# Patient Record
Sex: Female | Born: 1955 | Race: White | Hispanic: No | Marital: Married | Smoking: Current every day smoker
Health system: Southern US, Community
[De-identification: ages and names within clinical notes are randomized; demographics above are authoritative.]

## PROBLEM LIST (undated history)

## (undated) DIAGNOSIS — R040 Epistaxis: Secondary | ICD-10-CM

## (undated) DIAGNOSIS — D735 Infarction of spleen: Secondary | ICD-10-CM

## (undated) DIAGNOSIS — R918 Other nonspecific abnormal finding of lung field: Secondary | ICD-10-CM

## (undated) DIAGNOSIS — I330 Acute and subacute infective endocarditis: Secondary | ICD-10-CM

## (undated) HISTORY — PX: CHOLECYSTECTOMY: SHX55

## (undated) HISTORY — PX: BACK SURGERY: SHX140

---

## 2008-10-26 ENCOUNTER — Emergency Department (HOSPITAL_COMMUNITY): Admission: EM | Admit: 2008-10-26 | Discharge: 2008-10-26 | Payer: Self-pay | Admitting: Emergency Medicine

## 2010-08-31 LAB — COMPREHENSIVE METABOLIC PANEL
ALT: 17 U/L (ref 0–35)
AST: 20 U/L (ref 0–37)
Albumin: 4.2 g/dL (ref 3.5–5.2)
CO2: 23 mEq/L (ref 19–32)
Chloride: 108 mEq/L (ref 96–112)
GFR calc Af Amer: >60 mL/min (ref >60)
GFR calc non Af Amer: >60 mL/min (ref >60)
Sodium: 138 mEq/L (ref 135–145)
Total Bilirubin: 1.1 mg/dL (ref 0.3–1.2)

## 2010-08-31 LAB — CBC
Platelets: 261 10*3/uL (ref 150–400)
RBC: 4.25 MIL/uL (ref 3.87–5.11)
WBC: 11.5 10*3/uL — ABNORMAL HIGH (ref 4.0–10.5)

## 2010-08-31 LAB — DIFFERENTIAL
Basophils Absolute: 0.1 10*3/uL (ref 0.0–0.1)
Eosinophils Absolute: 0.1 10*3/uL (ref 0.0–0.7)
Eosinophils Relative: 1 % (ref 0–5)
Lymphocytes Relative: 23 % (ref 12–46)
Lymphs Abs: 2.6 10*3/uL (ref 0.7–4.0)
Monocytes Absolute: 0.6 10*3/uL (ref 0.1–1.0)

## 2010-08-31 LAB — PROTIME-INR: Prothrombin Time: 13.6 seconds (ref 11.6–15.2)

## 2010-10-06 NOTE — Op Note (Signed)
NAMEJUANICE, WARBURTON               ACCOUNT NO.:  [id number]   MEDICAL RECORD NO.:  [id number]          PATIENT TYPE:  EMS   LOCATION:  MAJO                         FACILITY:  [facility]   PHYSICIAN:  Kristine Garbe. Ezzard Standing, M.D.DATE OF BIRTH:  11/07/1955   DATE OF PROCEDURE:  10/26/2008  DATE OF DISCHARGE:                               OPERATIVE REPORT   PREOPERATIVE DIAGNOSIS:  Left-sided epistaxis, posterior.   POSTOPERATIVE DIAGNOSIS:  Left-sided epistaxis, posterior.   OPERATION:  Endoscopic cauterization of left-sided epistaxis.   SURGEON:  Kristine Garbe. Ezzard Standing, MD   ANESTHESIA:  General endotracheal.   COMPLICATIONS:  None.   BRIEF CLINICAL NOTE:  Cadience Bradfield is a 55 year old female who has had  chronic nosebleed now for about 3-4 days.  She was seen at urgent care,  had packing placed, but continued to have bleeding posteriorly as well  as coming out of the right side, although it began on the left side.  She was subsequently transferred to [facility] ER.  Upon our evaluation in the  ER, she had some deviation of septum to the left as well as bleeding  from what appears to be a posterior type of nosebleed.  Packing was  unsuccessful in stopping the bleeding and she was taken to operating  room at this time for endoscopic cauterization of the left-sided  epistaxis.   DESCRIPTION OF PROCEDURE:  The patient was brought to the operating  room.  She underwent general endotracheal anesthesia.  The previous  packing was removed and the bleeding site appeared to be anterior middle  turbinate.  She also had some septal spurring to the left.  The anterior  middle turbinate was cauterized using suction cautery and no further  bleeding was observed.  The nose was cleaned of all the blood clot, no  active bleeding noted after cauterizing the anterior middle turbinate.  Some pieces of Surgicel were placed superiorly in the nose around the  middle turbinate and then the floor of the nose was  packed with single  Merocel pack.  Nasogastric tube was passed and stomach was suctioned,  oropharynx was examined, and there was no further bleeding noted.  The  patient was awoken from anesthesia and transferred to recovery room  postop doing well.   DISPOSITION:  Karren is discharged home later this evening on  amoxicillin suspension 500 mg b.i.d. for 5 days.  We will have her  follow up in my office in 4 days for recheck to have the packing  removed.   Thayer Ohm  thank you          ______________________________  Kristine Garbe. Ezzard Standing, M.D.    CEN/MEDQ  D:  10/26/2008  T:  10/27/2008  Job:  [id number]

## 2010-10-06 NOTE — Consult Note (Signed)
NAMECAELI, LINEHAN               ACCOUNT NO.:  [id number]   MEDICAL RECORD NO.:  [id number]          PATIENT TYPE:  EMS   LOCATION:  MAJO                         FACILITY:  [facility]   PHYSICIAN:  Kristine Garbe. Ezzard Standing, M.D.DATE OF BIRTH:  1955-11-17   DATE OF CONSULTATION:  10/26/2008  DATE OF DISCHARGE:                                 CONSULTATION   REASON FOR CONSULTATION:  Epistaxis.   BRIEF HISTORY:  Becky Patterson is a 55 year old female who presents to  [facility] ER referred by urgent care because of persistent epistaxis despite  the packing.  This began a couple of days ago with some intermittent  bleeding from the left nostril.  The bleeding became more profuse  earlier today.  She was seen at urgent care where they attempted to  cauterize and pack the nose, but she continued to have bleeding from  both sides and was referred to [facility] Emergency Room.  I saw the patient  in [facility] Emergency Room and on evaluation in [facility] Emergency Room, she has  what appears to be a posterior left-sided epistaxis.  Packing, Rhino  Rocket was placed, but she continued to have bleeding.  She subsequently  admitted for endoscopic cauterization of epistaxis on the left side.  She has no previous health history.  No coronary artery disease.  No  blood thinners.  No real medications.   ALLERGIES:  CODEINE, which causes nausea and vomiting.   PHYSICAL EXAMINATION:  HEENT:  The patient has a bleeding from the left  posterior nasal passageway, could not identify the definite site.  CARDIAC:  Regular rate and rhythm without murmur.  LUNGS:  Clear.  ABDOMEN:  Soft, nontender.  EXTREMITIES:  Normal.   IMPRESSION:  Posterior left epistaxis.   PLAN:  The patient will be admitted and taken to the operating room for  endoscopic cauterization of left-sided epistaxis.           ______________________________  Kristine Garbe Ezzard Standing, M.D.     CEN/MEDQ  D:  10/26/2008  T:  10/27/2008  Job:  [id number]

## 2015-06-15 ENCOUNTER — Emergency Department (HOSPITAL_COMMUNITY): Payer: 59

## 2015-06-15 ENCOUNTER — Inpatient Hospital Stay (HOSPITAL_COMMUNITY)
Admission: EM | Admit: 2015-06-15 | Discharge: 2015-06-26 | DRG: 871 | Disposition: A | Discharge status: Home / Self Care | Payer: 59 | Attending: Family Medicine | Admitting: Family Medicine

## 2015-06-15 ENCOUNTER — Encounter (HOSPITAL_COMMUNITY): Payer: Self-pay | Admitting: Emergency Medicine

## 2015-06-15 DIAGNOSIS — J984 Other disorders of lung: Secondary | ICD-10-CM | POA: Diagnosis not present

## 2015-06-15 DIAGNOSIS — A4181 Sepsis due to Enterococcus: Principal | ICD-10-CM | POA: Diagnosis present

## 2015-06-15 DIAGNOSIS — D638 Anemia in other chronic diseases classified elsewhere: Secondary | ICD-10-CM | POA: Diagnosis present

## 2015-06-15 DIAGNOSIS — F17200 Nicotine dependence, unspecified, uncomplicated: Secondary | ICD-10-CM | POA: Diagnosis present

## 2015-06-15 DIAGNOSIS — E876 Hypokalemia: Secondary | ICD-10-CM | POA: Diagnosis present

## 2015-06-15 DIAGNOSIS — N12 Tubulo-interstitial nephritis, not specified as acute or chronic: Secondary | ICD-10-CM | POA: Diagnosis present

## 2015-06-15 DIAGNOSIS — R911 Solitary pulmonary nodule: Secondary | ICD-10-CM | POA: Diagnosis not present

## 2015-06-15 DIAGNOSIS — K529 Noninfective gastroenteritis and colitis, unspecified: Secondary | ICD-10-CM | POA: Diagnosis present

## 2015-06-15 DIAGNOSIS — R0902 Hypoxemia: Secondary | ICD-10-CM | POA: Diagnosis present

## 2015-06-15 DIAGNOSIS — I059 Rheumatic mitral valve disease, unspecified: Secondary | ICD-10-CM | POA: Diagnosis present

## 2015-06-15 DIAGNOSIS — D649 Anemia, unspecified: Secondary | ICD-10-CM

## 2015-06-15 DIAGNOSIS — D735 Infarction of spleen: Secondary | ICD-10-CM | POA: Diagnosis present

## 2015-06-15 DIAGNOSIS — R509 Fever, unspecified: Secondary | ICD-10-CM | POA: Diagnosis not present

## 2015-06-15 DIAGNOSIS — I349 Nonrheumatic mitral valve disorder, unspecified: Secondary | ICD-10-CM | POA: Diagnosis not present

## 2015-06-15 DIAGNOSIS — E861 Hypovolemia: Secondary | ICD-10-CM | POA: Diagnosis present

## 2015-06-15 DIAGNOSIS — J189 Pneumonia, unspecified organism: Secondary | ICD-10-CM | POA: Diagnosis present

## 2015-06-15 DIAGNOSIS — I959 Hypotension, unspecified: Secondary | ICD-10-CM | POA: Diagnosis present

## 2015-06-15 DIAGNOSIS — R739 Hyperglycemia, unspecified: Secondary | ICD-10-CM | POA: Diagnosis present

## 2015-06-15 DIAGNOSIS — R918 Other nonspecific abnormal finding of lung field: Secondary | ICD-10-CM | POA: Diagnosis present

## 2015-06-15 DIAGNOSIS — Z720 Tobacco use: Secondary | ICD-10-CM | POA: Diagnosis present

## 2015-06-15 DIAGNOSIS — B9689 Other specified bacterial agents as the cause of diseases classified elsewhere: Secondary | ICD-10-CM | POA: Diagnosis present

## 2015-06-15 DIAGNOSIS — C3490 Malignant neoplasm of unspecified part of unspecified bronchus or lung: Secondary | ICD-10-CM

## 2015-06-15 DIAGNOSIS — N10 Acute tubulo-interstitial nephritis: Secondary | ICD-10-CM | POA: Diagnosis not present

## 2015-06-15 DIAGNOSIS — M549 Dorsalgia, unspecified: Secondary | ICD-10-CM | POA: Diagnosis present

## 2015-06-15 DIAGNOSIS — I330 Acute and subacute infective endocarditis: Secondary | ICD-10-CM | POA: Diagnosis present

## 2015-06-15 DIAGNOSIS — R7881 Bacteremia: Secondary | ICD-10-CM | POA: Diagnosis not present

## 2015-06-15 DIAGNOSIS — A419 Sepsis, unspecified organism: Secondary | ICD-10-CM | POA: Diagnosis present

## 2015-06-15 DIAGNOSIS — B952 Enterococcus as the cause of diseases classified elsewhere: Secondary | ICD-10-CM

## 2015-06-15 DIAGNOSIS — Z981 Arthrodesis status: Secondary | ICD-10-CM | POA: Diagnosis not present

## 2015-06-15 DIAGNOSIS — D509 Iron deficiency anemia, unspecified: Secondary | ICD-10-CM | POA: Diagnosis present

## 2015-06-15 DIAGNOSIS — D72829 Elevated white blood cell count, unspecified: Secondary | ICD-10-CM

## 2015-06-15 DIAGNOSIS — E871 Hypo-osmolality and hyponatremia: Secondary | ICD-10-CM | POA: Diagnosis present

## 2015-06-15 DIAGNOSIS — I76 Septic arterial embolism: Secondary | ICD-10-CM | POA: Diagnosis present

## 2015-06-15 DIAGNOSIS — R160 Hepatomegaly, not elsewhere classified: Secondary | ICD-10-CM | POA: Diagnosis present

## 2015-06-15 DIAGNOSIS — N390 Urinary tract infection, site not specified: Secondary | ICD-10-CM

## 2015-06-15 DIAGNOSIS — D689 Coagulation defect, unspecified: Secondary | ICD-10-CM | POA: Diagnosis not present

## 2015-06-15 DIAGNOSIS — E119 Type 2 diabetes mellitus without complications: Secondary | ICD-10-CM | POA: Diagnosis not present

## 2015-06-15 LAB — URINALYSIS, ROUTINE W REFLEX MICROSCOPIC
Bilirubin Urine: NEGATIVE
Glucose, UA: NEGATIVE mg/dL
KETONES UR: NEGATIVE mg/dL
NITRITE: NEGATIVE
PH: 5.5 (ref 5.0–8.0)
Protein, ur: 30 mg/dL — AB
SPECIFIC GRAVITY, URINE: 1.014 (ref 1.005–1.030)

## 2015-06-15 LAB — COMPREHENSIVE METABOLIC PANEL
ALT: 30 U/L (ref 14–54)
AST: 30 U/L (ref 15–41)
Albumin: 2.4 g/dL — ABNORMAL LOW (ref 3.5–5.0)
Alkaline Phosphatase: 115 U/L (ref 38–126)
Anion gap: 12 (ref 5–15)
BUN: 12 mg/dL (ref 6–20)
CHLORIDE: 93 mmol/L — AB (ref 101–111)
CO2: 22 mmol/L (ref 22–32)
Calcium: 8.7 mg/dL — ABNORMAL LOW (ref 8.9–10.3)
Creatinine, Ser: 1.16 mg/dL — ABNORMAL HIGH (ref 0.44–1.00)
GFR, EST AFRICAN AMERICAN: 58 mL/min — AB (ref >60)
GFR, EST NON AFRICAN AMERICAN: 50 mL/min — AB (ref >60)
Glucose, Bld: 199 mg/dL — ABNORMAL HIGH (ref 65–99)
POTASSIUM: 3.7 mmol/L (ref 3.5–5.1)
SODIUM: 127 mmol/L — AB (ref 135–145)
Total Bilirubin: 0.6 mg/dL (ref 0.3–1.2)
Total Protein: 6.9 g/dL (ref 6.5–8.1)

## 2015-06-15 LAB — CBC WITH DIFFERENTIAL/PLATELET
BASOS ABS: 0 10*3/uL (ref 0.0–0.1)
BASOS PCT: 0 %
Eosinophils Absolute: 0 10*3/uL (ref 0.0–0.7)
Eosinophils Relative: 0 %
HEMATOCRIT: 32.6 % — AB (ref 36.0–46.0)
HEMOGLOBIN: 11.2 g/dL — AB (ref 12.0–15.0)
LYMPHS PCT: 5 %
Lymphs Abs: 1.1 10*3/uL (ref 0.7–4.0)
MCH: 31.7 pg (ref 26.0–34.0)
MCHC: 34.4 g/dL (ref 30.0–36.0)
MCV: 92.4 fL (ref 78.0–100.0)
Monocytes Absolute: 0.8 10*3/uL (ref 0.1–1.0)
Monocytes Relative: 4 %
NEUTROS ABS: 20.8 10*3/uL — AB (ref 1.7–7.7)
NEUTROS PCT: 91 %
Platelets: 230 10*3/uL (ref 150–400)
RBC: 3.53 MIL/uL — AB (ref 3.87–5.11)
RDW: 13.3 % (ref 11.5–15.5)
WBC: 22.8 10*3/uL — AB (ref 4.0–10.5)

## 2015-06-15 LAB — STREP PNEUMONIAE URINARY ANTIGEN: STREP PNEUMO URINARY ANTIGEN: NEGATIVE

## 2015-06-15 LAB — URINE MICROSCOPIC-ADD ON

## 2015-06-15 LAB — I-STAT CG4 LACTIC ACID, ED
LACTIC ACID, VENOUS: 3.02 mmol/L — AB (ref 0.5–2.0)
Lactic Acid, Venous: 0.74 mmol/L (ref 0.5–2.0)

## 2015-06-15 LAB — CREATININE, SERUM
Creatinine, Ser: 1.04 mg/dL — ABNORMAL HIGH (ref 0.44–1.00)
GFR calc Af Amer: >60 mL/min (ref >60)
GFR calc non Af Amer: 57 mL/min — ABNORMAL LOW (ref >60)

## 2015-06-15 LAB — RETICULOCYTES
RBC.: 2.7 MIL/uL — AB (ref 3.87–5.11)
RETIC COUNT ABSOLUTE: 45.9 10*3/uL (ref 19.0–186.0)
Retic Ct Pct: 1.7 % (ref 0.4–3.1)

## 2015-06-15 LAB — CBC
HCT: 24.9 % — ABNORMAL LOW (ref 36.0–46.0)
HEMOGLOBIN: 8.6 g/dL — AB (ref 12.0–15.0)
MCH: 31.9 pg (ref 26.0–34.0)
MCHC: 34.5 g/dL (ref 30.0–36.0)
MCV: 92.2 fL (ref 78.0–100.0)
Platelets: 201 10*3/uL (ref 150–400)
RBC: 2.7 MIL/uL — ABNORMAL LOW (ref 3.87–5.11)
RDW: 13.3 % (ref 11.5–15.5)
WBC: 20 10*3/uL — ABNORMAL HIGH (ref 4.0–10.5)

## 2015-06-15 LAB — MRSA PCR SCREENING: MRSA BY PCR: NEGATIVE

## 2015-06-15 MED ORDER — VANCOMYCIN HCL IN DEXTROSE 1-5 GM/200ML-% IV SOLN: 1000.0000 mg | Freq: Once | INTRAVENOUS | Status: DC

## 2015-06-15 MED ORDER — PIPERACILLIN-TAZOBACTAM 3.375 G IVPB
3.3750 g | Freq: Three times a day (TID) | INTRAVENOUS | Status: DC
  Filled 2015-06-15: qty 50

## 2015-06-15 MED ORDER — SODIUM CHLORIDE 0.9 % IV SOLN
INTRAVENOUS | Status: DC
  Administered 2015-06-15 – 2015-06-18 (×7): via INTRAVENOUS

## 2015-06-15 MED ORDER — HYDROCODONE-ACETAMINOPHEN 5-325 MG PO TABS: 1.0000 | ORAL_TABLET | ORAL | Status: DC | PRN

## 2015-06-15 MED ORDER — DEXTROSE 5 % IV SOLN
500.0000 mg | INTRAVENOUS | Status: DC
  Administered 2015-06-16: 500 mg via INTRAVENOUS
  Filled 2015-06-15 (×2): qty 500

## 2015-06-15 MED ORDER — SODIUM CHLORIDE 0.9 % IJ SOLN
3.0000 mL | Freq: Two times a day (BID) | INTRAMUSCULAR | Status: DC
  Administered 2015-06-15 – 2015-06-26 (×16): 3 mL via INTRAVENOUS

## 2015-06-15 MED ORDER — SODIUM CHLORIDE 0.9 % IV BOLUS (SEPSIS)
1000.0000 mL | INTRAVENOUS | Status: AC
  Administered 2015-06-15 (×3): 1000 mL via INTRAVENOUS

## 2015-06-15 MED ORDER — DEXTROSE 5 % IV SOLN
1.0000 g | INTRAVENOUS | Status: DC
  Administered 2015-06-16: 1 g via INTRAVENOUS
  Filled 2015-06-15: qty 10

## 2015-06-15 MED ORDER — SODIUM CHLORIDE 0.9 % IV BOLUS (SEPSIS)
1000.0000 mL | Freq: Once | INTRAVENOUS | Status: AC
  Administered 2015-06-15: 1000 mL via INTRAVENOUS

## 2015-06-15 MED ORDER — ACETAMINOPHEN 650 MG RE SUPP: 650.0000 mg | Freq: Four times a day (QID) | RECTAL | Status: DC | PRN

## 2015-06-15 MED ORDER — DEXTROSE 5 % IV SOLN: 1.0000 g | Freq: Once | INTRAVENOUS | Status: DC

## 2015-06-15 MED ORDER — PIPERACILLIN-TAZOBACTAM 3.375 G IVPB 30 MIN
3.3750 g | Freq: Once | INTRAVENOUS | Status: AC
  Administered 2015-06-15: 3.375 g via INTRAVENOUS
  Filled 2015-06-15: qty 50

## 2015-06-15 MED ORDER — ONDANSETRON HCL 4 MG/2ML IJ SOLN
4.0000 mg | Freq: Four times a day (QID) | INTRAMUSCULAR | Status: DC | PRN
  Administered 2015-06-17 – 2015-06-18 (×3): 4 mg via INTRAVENOUS
  Filled 2015-06-15 (×3): qty 2

## 2015-06-15 MED ORDER — DEXTROSE 5 % IV SOLN: 500.0000 mg | Freq: Once | INTRAVENOUS | Status: DC

## 2015-06-15 MED ORDER — ACETAMINOPHEN 325 MG PO TABS
650.0000 mg | ORAL_TABLET | Freq: Four times a day (QID) | ORAL | Status: DC | PRN
  Administered 2015-06-16: 650 mg via ORAL
  Filled 2015-06-15: qty 2

## 2015-06-15 MED ORDER — ACETAMINOPHEN 325 MG PO TABS
650.0000 mg | ORAL_TABLET | Freq: Once | ORAL | Status: AC
  Administered 2015-06-15: 650 mg via ORAL
  Filled 2015-06-15: qty 2

## 2015-06-15 MED ORDER — VANCOMYCIN HCL 500 MG IV SOLR
500.0000 mg | Freq: Once | INTRAVENOUS | Status: AC
  Administered 2015-06-15: 500 mg via INTRAVENOUS
  Filled 2015-06-15: qty 500

## 2015-06-15 MED ORDER — VANCOMYCIN HCL IN DEXTROSE 750-5 MG/150ML-% IV SOLN
750.0000 mg | Freq: Two times a day (BID) | INTRAVENOUS | Status: DC
  Filled 2015-06-15: qty 150

## 2015-06-15 MED ORDER — ONDANSETRON HCL 4 MG PO TABS
4.0000 mg | ORAL_TABLET | Freq: Four times a day (QID) | ORAL | Status: DC | PRN
  Administered 2015-06-21: 4 mg via ORAL
  Filled 2015-06-15: qty 1

## 2015-06-15 MED ORDER — VANCOMYCIN HCL IN DEXTROSE 1-5 GM/200ML-% IV SOLN
1000.0000 mg | Freq: Once | INTRAVENOUS | Status: AC
  Administered 2015-06-15: 1000 mg via INTRAVENOUS
  Filled 2015-06-15: qty 200

## 2015-06-15 MED ORDER — ENOXAPARIN SODIUM 40 MG/0.4ML ~~LOC~~ SOLN
40.0000 mg | SUBCUTANEOUS | Status: DC
  Administered 2015-06-16: 40 mg via SUBCUTANEOUS
  Filled 2015-06-15: qty 0.4

## 2015-06-15 NOTE — ED Provider Notes (Signed)
CSN: [id number]     Arrival date & time 06/15/15  1356 History   First MD Initiated Contact with Patient 06/15/15 1547     Chief Complaint  Patient presents with  . Back Pain   HPI   60 year old female presents today with concerns of back pain and weakness. Patient reports that approximately 3-4 weeks ago she had reported flu with upper respiratory complaints, nausea, vomiting, diarrhea. She reports that several weeks and this is with some improvement when she injured her back. She reports significant past medical history of lumbar fusion and reported right hip operation. She reports that 8 days ago she went to step up onto a chair with her right leg and felt a sharp pain to her right lower back and hip. She reports this pain was severe, persistent, made worse with ambulation movements. She notes some bilateral intermittent tingling of the feet but denies any loss of distal sensation strength or motor function. Patient denies any bowel or bladder incontinence or tenderness over the spine. She was seen at urgent care given an injection at the site of the pain, and pain medication.  She reports that her urine has been concentrated and dark with an odor, but denies any burning. She denies any belly pain since getting over the flu, but does report shakes this morning. She reports decreased appetite, but is tolerating by mouth. She denies any upper respiratory complaints including rhinorrhea, congestion, she does note a dry nonproductive cough that is her baseline due to smoking. She denies any recent antibiotic exposure, does report runny bowel movements.   History reviewed. No pertinent past medical history. Past Surgical History  Procedure Laterality Date  . Back surgery     No family history on file. Social History  Substance Use Topics  . Smoking status: Current Every Day Smoker  . Smokeless tobacco: None  . Alcohol Use: No   OB History    No data available     Review of Systems  All  other systems reviewed and are negative.   Allergies  Codeine  Home Medications   Prior to Admission medications   Medication Sig Start Date End Date Taking? Authorizing Provider  acetaminophen (TYLENOL) 500 MG tablet Take 1,000 mg by mouth 2 (two) times daily as needed for mild pain or headache.   Yes Historical Provider, MD  Hydrocodone-Acetaminophen 5-300 MG TABS Take 1 tablet by mouth every 6 (six) hours as needed (for pain). Reported on 06/15/2015 06/08/15  Yes Historical Provider, MD  naproxen sodium (ANAPROX) 220 MG tablet Take 440 mg by mouth 2 (two) times daily as needed (for pain).   Yes Historical Provider, MD  predniSONE (DELTASONE) 20 MG tablet Take 20 mg by mouth daily. Reported on 06/15/2015 06/08/15  Yes Historical Provider, MD   BP 85/53 mmHg  Pulse 87  Temp(Src) 100.5 F (38.1 C) (Oral)  Resp 23  Ht '5\' 2"'$  (1.575 m)  Wt 90.266 kg  BMI 36.39 kg/m2  SpO2 97%   Physical Exam  Constitutional: She is oriented to person, place, and time. She appears well-developed and well-nourished. No distress.  HENT:  Head: Normocephalic and atraumatic.  Eyes: Conjunctivae are normal. Pupils are equal, round, and reactive to light. Right eye exhibits no discharge. Left eye exhibits no discharge. No scleral icterus.  Neck: Normal range of motion. Neck supple. No JVD present. No tracheal deviation present.  Cardiovascular: Regular rhythm, normal heart sounds and intact distal pulses.  Exam reveals no gallop and no friction  rub.   No murmur heard. Pulmonary/Chest: Effort normal and breath sounds normal. No stridor. No respiratory distress. She has no wheezes. She has no rales. She exhibits no tenderness.  Abdominal: She exhibits no distension and no mass. There is no tenderness. There is no rebound and no guarding.  Musculoskeletal: Normal range of motion. She exhibits tenderness. She exhibits no edema.  No C, T, or L spine tenderness to palpation. No obvious signs of trauma, infection,  step-offs. Lung expansion normal. No scoliosis or kyphosis. Bilateral lower extremity strength 5 out of 5, sensation grossly intact, patellar reflexes 2+, pedal pulses 2+, Refill less than 3 seconds.  Pt does have area of soft tissue abnormality to lumbar spine, not warm to touch, non tender, no redness, Pt reports this is chronic since surgery   TTP of right posterior hip, pain worse with flexion extension.   Straight leg negative    Neurological: She is alert and oriented to person, place, and time. Coordination normal.  Skin: Skin is warm and dry. She is not diaphoretic.  Psychiatric: She has a normal mood and affect. Her behavior is normal. Judgment and thought content normal.  Nursing note and vitals reviewed.    ED Course  Procedures (including critical care time) Labs Review Labs Reviewed  COMPREHENSIVE METABOLIC PANEL - Abnormal; Notable for the following:    Sodium 127 (*)    Chloride 93 (*)    Glucose, Bld 199 (*)    Creatinine, Ser 1.16 (*)    Calcium 8.7 (*)    Albumin 2.4 (*)    GFR calc non Af Amer 50 (*)    GFR calc Af Amer 58 (*)    All other components within normal limits  CBC WITH DIFFERENTIAL/PLATELET - Abnormal; Notable for the following:    WBC 22.8 (*)    RBC 3.53 (*)    Hemoglobin 11.2 (*)    HCT 32.6 (*)    Neutro Abs 20.8 (*)    All other components within normal limits  URINALYSIS, ROUTINE W REFLEX MICROSCOPIC (NOT AT [facility]) - Abnormal; Notable for the following:    Color, Urine AMBER (*)    APPearance CLOUDY (*)    Hgb urine dipstick MODERATE (*)    Protein, ur 30 (*)    Leukocytes, UA MODERATE (*)    All other components within normal limits  URINE MICROSCOPIC-ADD ON - Abnormal; Notable for the following:    Squamous Epithelial / LPF 6-30 (*)    Bacteria, UA MANY (*)    All other components within normal limits  I-STAT CG4 LACTIC ACID, ED - Abnormal; Notable for the following:    Lactic Acid, Venous 3.02 (*)    All other components within  normal limits  CULTURE, BLOOD (ROUTINE X 2)  CULTURE, BLOOD (ROUTINE X 2)  URINE CULTURE  I-STAT CG4 LACTIC ACID, ED  I-STAT CG4 LACTIC ACID, ED  I-STAT CG4 LACTIC ACID, ED  I-STAT CG4 LACTIC ACID, ED    Imaging Review Dg Chest 2 View  06/15/2015  CLINICAL DATA:  Productive cough for 3 weeks, initial encounter EXAM: CHEST - 2 VIEW COMPARISON:  None. FINDINGS: Cardiac shadow is within normal limits. The lungs are well aerated bilaterally. There is a well-circumscribed 2 cm nodule identified in the mid left lung which appears to project over the spine in the left lower lobe on the lateral projection. Additionally a second somewhat nodular density is noted in the medial costophrenic angle on the left. These changes are  suspicious for metastatic disease in CT of the chest is recommended. IMPRESSION: Nodular changes on the left. This is suspicious for metastatic disease. CT of the chest is recommended Electronically Signed   By: Inez Catalina M.D.   On: 06/15/2015 16:01   Dg Lumbar Spine Complete  06/15/2015  CLINICAL DATA:  Lumbar spine pain for 2 weeks, right hip pain, history of lumbar fusion 30 years ago EXAM: LUMBAR SPINE - COMPLETE 4+ VIEW COMPARISON:  None. FINDINGS: L3 through the sacrum bilateral osseous fusion masses. Focal irregular sclerosis over the right sacroiliac joint primarily on the iliac side. This process measures 5 cm. Left sacroiliac joint normal. Grade 1 borderline grade 2 anterior listhesis of L4 on L5. Severe L4-5 degenerative disc disease. Mild degenerative disc disease at L5-S1 and L3-L4. Moderate degenerative disc disease at L2-3. Minimal degenerative disc disease at L1-2. No evidence of vertebral body fracture. IMPRESSION: Postoperative and degenerative changes as described above. Irregular sclerosis right iliac wing. In metastatic lesions not excluded. There are no prior studies for comparison. If no prior studies can't be obtained to document long-term stability in this  finding, the possibility of malignancy would have to be considered and bone scan would be suggested. Electronically Signed   By: Skipper Cliche M.D.   On: 06/15/2015 17:25   Ct Chest Wo Contrast  06/15/2015  CLINICAL DATA:  Productive cough for 3 weeks. Decreased appetite. Left lung nodule on chest x-ray 06/15/2015. EXAM: CT CHEST WITHOUT CONTRAST TECHNIQUE: Multidetector CT imaging of the chest was performed following the standard protocol without IV contrast. COMPARISON:  Chest radiograph 06/15/2015 FINDINGS: There is a circumscribed nodule in the superior segment left lower lobe measuring 1.8 cm diameter. Appearance is suspicious for malignancy, possibly metastasis due to the shape. No significant spiculation. No other nodules identified. Consider PET-CT versus biopsy for further evaluation. Atelectasis in both lung bases. No focal consolidation. No pneumothorax. No pleural effusions. Esophagus is decompressed. No significant lymphadenopathy in the mediastinum or hilar regions on noncontrast imaging. Normal caliber thoracic aorta. Normal heart size. Visualized portions of the upper abdominal organs demonstrate surgical absence of the gallbladder. Possibility of prominent lymph nodes in the left periaortic region although incompletely evaluated. Degenerative changes in the thoracic spine. IMPRESSION: 1.8 cm diameter circumscribed nodule in the superior segment left lobe of liver is indeterminate. Malignancy should be excluded. Consider further evaluation with PET-CT or biopsy. Electronically Signed   By: Lucienne Capers M.D.   On: 06/15/2015 19:04   Dg Hip Unilat With Pelvis 2-3 Views Right  06/15/2015  CLINICAL DATA:  60 year old female with right hip pain after injury while walking 2 weeks ago. Initial encounter. EXAM: DG HIP (WITH OR WITHOUT PELVIS) 2-3V RIGHT COMPARISON:  None. FINDINGS: Femoral heads are normally located. Hip joint spaces are preserved. Pelvis appears intact. There is sclerosis along  the medial right iliac bone which probably is postoperative in nature; there are lower lumbar postoperative fusion masses demonstrated bilaterally. The SI joints otherwise appear normal. Proximal left femur appears intact. Proximal right femur intact. IMPRESSION: No acute osseous abnormality identified about the right hip or pelvis. Postoperative changes to the medial right iliac bone and visualized lower lumbar levels. Electronically Signed   By: Genevie Ann M.D.   On: 06/15/2015 17:29   I have personally reviewed and evaluated these images and lab results as part of my medical decision-making.   EKG Interpretation None      MDM   Final diagnoses:  Sepsis, due to unspecified  organism [facility])    Labs: Sepsis labs- moderate leukocytes with 6-30 WBCs, sodium 127, creatinine 1.16  Imaging: DG chest 2 view, DG hip unilateral right, ED lumbar spine  Consults: Hospitalist- Danford MD  Therapeutics: Thank, Zosyn, normal saline  Discharge Meds:   Assessment/Plan: Patient presents as consistent with sepsis. She had an elevated lactic acid at 3.02 she was a brawl, tachycardic, hypotensive. She was started on sepsis protocol immediately. Patient has upper respiratory symptoms that would indicate pneumonia, she also has concerning findings on her chest x-ray and CT scan for malignancy. Urinalysis show moderate leukocytes which could also be source of infectious etiology, although probably secondary to upper respiratory complaints. Patient started on broad-spectrum antibiotics, hospitalist consult for hospital admission.              Okey Regal, PA-C 06/15/15 2034  Carmin Muskrat, MD 06/21/15 [phone]

## 2015-06-15 NOTE — Progress Notes (Addendum)
ANTIBIOTIC CONSULT NOTE - INITIAL  Pharmacy Consult for Azithro/CTX Indication: CAP  Allergies  Allergen Reactions  . Codeine Nausea And Vomiting    Patient Measurements: Height: '5\' 2"'$  (157.5 cm) Weight: 202 lb 9.6 oz (91.9 kg) IBW/kg (Calculated) : 50.1  Vital Signs: Temp: 97.8 F (36.6 C) (01/22 2131) Temp Source: Oral (01/22 2131) BP: 103/81 mmHg (01/22 2131) Pulse Rate: 88 (01/22 2131) Intake/Output from previous day:   Intake/Output from this shift: Total I/O In: 1000 [I.V.:1000] Out: -   Labs:  Recent Labs  06/15/15 1520  WBC 22.8*  HGB 11.2*  PLT 230  CREATININE 1.16*   Estimated Creatinine Clearance: 53.7 mL/min (by C-G formula based on Cr of 1.16). No results for input(s): VANCOTROUGH, VANCOPEAK, VANCORANDOM, GENTTROUGH, GENTPEAK, GENTRANDOM, TOBRATROUGH, TOBRAPEAK, TOBRARND, AMIKACINPEAK, AMIKACINTROU, AMIKACIN in the last 72 hours.   Microbiology: No results found for this or any previous visit (from the past 720 hour(s)).  Medical History: History reviewed. No pertinent past medical history.  Assessment: 48 YOF who presented on 1/22 to the [facility] with back pain x 2 weeks and decreased appetite. Pharmacy consulted to start ceftriaxone/azithro for CAP, and vanc/zosyn d/c'd.  Goal of Therapy:  Eradication of infection  Plan:  CTX 1g IV q24h Azithro '500mg'$  IV q24h Not renally adjusted - Rx will sign off  Elicia Lamp, PharmD, [facility] Clinical Pharmacist Pager [phone] 06/15/2015 10:07 PM

## 2015-06-15 NOTE — Progress Notes (Signed)
Pt transferred from ED with RN. Pt afebrile at this time and VSS, family at bedside and oriented to unit. Pt able to ambulate to bathroom, and back to bed.

## 2015-06-15 NOTE — ED Notes (Signed)
Patient transported to X-ray 

## 2015-06-15 NOTE — ED Notes (Signed)
Pt c/o back pain onset 2 weeks ago when she stepped wrong. Family reports that pt has not been eating or drinking enough because she has recently got over the flu. Pt feels she has drank enough. Pt seen at urgent care last Sunday, prescribed meds not working.

## 2015-06-15 NOTE — ED Notes (Signed)
Attempted Report 

## 2015-06-15 NOTE — H&P (Signed)
History and Physical  Patient Name: Becky Patterson     OEU:[id number]    DOB: [birth year]    DOA: 06/15/2015 Referring physician: Lenn Sink, PA-C PCP: No primary care provider on file.      Chief Complaint: Fever, malaise  HPI: Becky Patterson is a 60 y.o. female with a past medical history significant for smoking and no medical follow up who presents with fever and cough.  The patient was in her usual state of health until about 2 or 3 weeks ago when she started to develop malaise, and diarrhea, occasional emesis, decreased appetite.  This was bothersome but not bad enough to seek care until today when she developed fever, pallor, shakes, fever, and so her family made her come in to the ER.  In the ED, the patient had a temperature to 102.37F, tachycardia to 1 27 bpm, tachypnea, and low blood pressure. Lactic acid level was initially 3, but resolved with fluids. She had leukocytosis and a chest x-ray that showed a left lower lobe opacity. Blood cultures and urine culture were drawn, 2 L of fluid and vancomycin and Zosyn were administered, and TRH were asked to evaluate for admission  Of note, the patient has had increased low back pain for the last week (for which she was given a prednisone taper at urgent care), and there is a question of whether she has lost about 20 pounds in the last several weeks of this illness. She has never had a colonoscopy, she reports having had a mammogram. Because of the nodular appearance of her left lower lobe opacity, CT chest recommended, which showed a well-circumscribed nodule 2 cm in diameter in the left lower lobe. In addition radiographs of the low back showed what appeared to be sclerotic lesions of the pelvis.     Review of Systems:  Pt complains of pallor, shakes, fever, malaise, back pain, sciatica, weight loss, productive cough, diarrhea, malaise, emesis, decreased appetite. All other systems negative except as just noted or noted in the history of  present illness.  Allergies  Allergen Reactions  . Codeine Nausea And Vomiting    Prior to Admission medications   Medication Sig Start Date End Date Taking? Authorizing Provider  acetaminophen (TYLENOL) 500 MG tablet Take 1,000 mg by mouth 2 (two) times daily as needed for mild pain or headache.   Yes Historical Provider, MD  Hydrocodone-Acetaminophen 5-300 MG TABS Take 1 tablet by mouth every 6 (six) hours as needed (for pain). Reported on 06/15/2015 06/08/15  Yes Historical Provider, MD  naproxen sodium (ANAPROX) 220 MG tablet Take 440 mg by mouth 2 (two) times daily as needed (for pain).   Yes Historical Provider, MD  predniSONE (DELTASONE) 20 MG tablet Take 20 mg by mouth daily. Reported on 06/15/2015 06/08/15  Yes Historical Provider, MD    History reviewed. No pertinent past medical history.  Past Surgical History  Procedure Laterality Date  . Back surgery      Family history: family history includes Heart attack in her brother; Liver cancer in her brother; Lung cancer in her mother.  Social History: Patient lives with her husband.  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  She does not drink.  She is an active smoker.  She is independent with all ADLs and IADLs.       Physical Exam: BP 89/56 mmHg  Pulse 87  Temp(Src) 100.5 F (38.1 C) (Oral)  Resp 20  Ht '5\' 2"'$  (1.575 m)  Wt  90.266 kg (199 lb)  BMI 36.39 kg/m2  SpO2 96% General appearance: Well-developed, obese adult female, alert and in no acute distress.   Eyes: Anicteric, conjunctiva pink, lids and lashes normal.     ENT: No nasal deformity, discharge, or epistaxis.  OP moist without lesions.   Skin: Warm and slightly diaphoretic.  Cardiac: Tachycardic, regular, nl Z6-W1, systolic murmur present, S3?  Capillary refill is brisk.  JVP not visible.  No LE edema.  Radial and DP pulses 2+ and symmetric. Respiratory: Normal respiratory rate and rhythm.  No wheezes.  Crackles on left. Abdomen: Abdomen soft without  rigidity.  No TTP. No ascites, distension.   MSK: No deformities or effusions. Neuro: Sensorium intact and responding to questions, attention normal.  Speech is fluent.  Moves all extremities equally and with normal coordination.    Psych: Behavior appropriate.  Affect blunted.  No evidence of aural or visual hallucinations or delusions.       Labs on Admission:  The metabolic panel shows hyponatremia, normal potassium, bicarbonate, and renal function. The transaminases and bilirubin are normal. The urinalysis shows Bacteria and leukocytes. The lactic acid level is 3, and normalizes with fluids. The albumin is low. The complete blood count shows leukocytosis 20K/UL, normocytic anemia, no thrombocytopenia.   Radiological Exams on Admission: Personally reviewed: Dg Chest 2 View 06/15/2015  Round nodule in L lung.  No obvious infiltrate.    Dg Lumbar Spine Complete 06/15/2015 "IMPRESSION: Postoperative and degenerative changes as described above. Irregular sclerosis right iliac wing. In metastatic lesions not excluded. There are no prior studies for comparison. If no prior studies can't be obtained to document long-term stability in this finding, the possibility of malignancy would have to be considered and bone scan would be suggested."   Ct Chest Wo Contrast 06/15/2015 "IMPRESSION: 1.8 cm diameter circumscribed nodule in the superior segment left lobe of lung is indeterminate. Malignancy should be excluded. Consider further evaluation with PET-CT or biopsy."  Dg Hip Unilat With Pelvis 2-3 Views Right 06/15/2015 "IMPRESSION: No acute osseous abnormality identified about the right hip or pelvis. Postoperative changes to the medial right iliac bone and visualized lower lumbar levels. "   EKG: Independently reviewed. Sinus tahcycarda, rate 110, QTc 460.  No ST changes.    Assessment/Plan 1. Sepsis:  This is new.  Suspected source lung. Organism unknown. Patient meets criteria given  tachycardia, tachypnea, fever, leukocytosis, and evidence of organ dysfunction.  Blood and urine cultures drawn.  Lactate exceeds 2 mmol/L and repeat ordered within 6 hours.  MAP > 65 mmHg. -Ceftriaxone and azithromycin for CAP -Flu PCR -30 ml/kg bolus given in ED, lactate normalized -Telemetry -Vital signs every one hour for the first 4 hours -Acetaminophen for fever -Follow urine and blood cultures -Strep urine antigen and sputum assessment -If murmur still present tomorrow after fluid resuscitation (or if BC positive), will obtain echocardiogram to rule out vegetations   2. Lung mass:  This is new.  Favor metastasis of unknown primary (given well circumscribed shape) vs hamartoma vs primary lung CA.  Radiology recommends either IR biopsy or PET/CT as patient becomes more stable clinically.   -Consult to IR for biopsy -FOBT -Mammogram as able  3. Smoking:  -Nursing smoking cessation therapy  4. Anemia, unspecified:  -Check FOBT -Check iron studies and B12/folate -Check reticulocytes  5. Hyponatremia: Hypovolemic by exam.  SIADH considered. -Urine osmolality and serum to calculate free water clearance       DVT PPx: Lovenox Diet: Regular  Consultants: IR Code Status: Full Family Communication: Husband, sister, daughter and other family present at bedside.  Dx of suspected pneumonia discussed.  Lung mass discussed, as well as plans to begin arranging for biopsy while inpatient, possibly to be completed as outpatient.  Stressed that tissue diagnosis was essential.  Medical decision making: What exists of the patient's previous chart was reviewed in depth and the case was discussed with Lenn Sink and Dr Gerilyn Nestle of Radiology by phone. Patient seen 9:10 PM on 06/15/2015.  Disposition Plan:  I recommend admission to step down given persistently low BP.  Status: high risk of deterioration.  Anticipate admission for treatment of pneumonia, 3-4 days  hospitalization.      Edwin Dada [facility] Pager [phone]

## 2015-06-15 NOTE — Progress Notes (Signed)
ANTIBIOTIC CONSULT NOTE - INITIAL  Pharmacy Consult for Vancomycin + Zosyn Indication: rule out sepsis  Allergies  Allergen Reactions  . Codeine     Patient Measurements: Height: '5\' 2"'$  (157.5 cm) Weight: 199 lb (90.266 kg) IBW/kg (Calculated) : 50.1  Vital Signs: Temp: 100.5 F (38.1 C) (01/22 1624) Temp Source: Oral (01/22 1624) BP: 89/57 mmHg (01/22 1622) Pulse Rate: 110 (01/22 1622) Intake/Output from previous day:   Intake/Output from this shift:    Labs:  Recent Labs  06/15/15 1520  WBC 22.8*  HGB 11.2*  PLT 230  CREATININE 1.16*   Estimated Creatinine Clearance: 53.2 mL/min (by C-G formula based on Cr of 1.16). No results for input(s): VANCOTROUGH, VANCOPEAK, VANCORANDOM, GENTTROUGH, GENTPEAK, GENTRANDOM, TOBRATROUGH, TOBRAPEAK, TOBRARND, AMIKACINPEAK, AMIKACINTROU, AMIKACIN in the last 72 hours.   Microbiology: No results found for this or any previous visit (from the past 720 hour(s)).  Medical History: History reviewed. No pertinent past medical history.  Assessment: 45 YOF who presented on 1/22 to the [facility] with back pain x 2 weeks and decreased appetite. Pharmacy consulted to start Vancomycin + Zosyn for r/o sepsis. Initial LA 3.02, SCr 1.16, CrCl~50-60 ml/min.   Goal of Therapy:  Vancomycin trough level 15-20 mcg/ml  Proper antibiotics for infection/cultures adjusted for renal/hepatic function   Plan:  1. Vancomycin 1g IV x 1 already ordered by the EDP 2. Give an additional Vancomycin 500 mg IV x 1 in addition to the 1g for a total loading dose of 1500 mg 3. After loading dose completed, start Vancomycin 750 mg IV every 12 hours 4. Zosyn 3.375g IV x 1 now over 30 minutes followed by 3.375g IV every 8 hours (infused over 4 hours) 5. Will continue to follow renal function, culture results, LOT, and antibiotic de-escalation plans   Alycia Rossetti, PharmD, BCPS Clinical Pharmacist Pager: [phone] 06/15/2015 4:44 PM

## 2015-06-16 ENCOUNTER — Inpatient Hospital Stay (HOSPITAL_COMMUNITY): Payer: 59

## 2015-06-16 ENCOUNTER — Encounter (HOSPITAL_COMMUNITY): Payer: Self-pay | Admitting: Radiology

## 2015-06-16 LAB — BASIC METABOLIC PANEL
Anion gap: 8 (ref 5–15)
BUN: 13 mg/dL (ref 6–20)
CALCIUM: 7.9 mg/dL — AB (ref 8.9–10.3)
CO2: 23 mmol/L (ref 22–32)
CREATININE: 0.99 mg/dL (ref 0.44–1.00)
Chloride: 104 mmol/L (ref 101–111)
GFR calc Af Amer: >60 mL/min (ref >60)
GLUCOSE: 213 mg/dL — AB (ref 65–99)
Potassium: 3.5 mmol/L (ref 3.5–5.1)
Sodium: 135 mmol/L (ref 135–145)

## 2015-06-16 LAB — CBC
HCT: 25.4 % — ABNORMAL LOW (ref 36.0–46.0)
Hemoglobin: 8.7 g/dL — ABNORMAL LOW (ref 12.0–15.0)
MCH: 31.9 pg (ref 26.0–34.0)
MCHC: 34.3 g/dL (ref 30.0–36.0)
MCV: 93 fL (ref 78.0–100.0)
PLATELETS: 193 10*3/uL (ref 150–400)
RBC: 2.73 MIL/uL — ABNORMAL LOW (ref 3.87–5.11)
RDW: 13.3 % (ref 11.5–15.5)
WBC: 16.4 10*3/uL — ABNORMAL HIGH (ref 4.0–10.5)

## 2015-06-16 LAB — OSMOLALITY, URINE: OSMOLALITY UR: 423 mosm/kg (ref 300–900)

## 2015-06-16 LAB — PROTIME-INR
INR: 1.52 — AB (ref 0.00–1.49)
Prothrombin Time: 18.4 seconds — ABNORMAL HIGH (ref 11.6–15.2)

## 2015-06-16 LAB — C DIFFICILE QUICK SCREEN W PCR REFLEX
C DIFFICILE (CDIFF) INTERP: NEGATIVE
C DIFFICILE (CDIFF) TOXIN: NEGATIVE
C DIFFICLE (CDIFF) ANTIGEN: NEGATIVE

## 2015-06-16 LAB — APTT: APTT: 34 s (ref 24–37)

## 2015-06-16 LAB — FERRITIN: FERRITIN: 334 ng/mL — AB (ref 11–307)

## 2015-06-16 LAB — INFLUENZA PANEL BY PCR (TYPE A & B)
H1N1FLUPCR: NOT DETECTED
INFLAPCR: NEGATIVE
Influenza B By PCR: NEGATIVE

## 2015-06-16 LAB — IRON AND TIBC
IRON: 6 ug/dL — AB (ref 28–170)
Saturation Ratios: 4 % — ABNORMAL LOW (ref 10.4–31.8)
TIBC: 169 ug/dL — ABNORMAL LOW (ref 250–450)
UIBC: 163 ug/dL

## 2015-06-16 LAB — VITAMIN B12: Vitamin B-12: 609 pg/mL (ref 180–914)

## 2015-06-16 LAB — SODIUM, URINE, RANDOM: Sodium, Ur: 55 mmol/L

## 2015-06-16 LAB — FOLATE: FOLATE: 13.6 ng/mL (ref >5.9)

## 2015-06-16 LAB — OSMOLALITY: OSMOLALITY: 283 mosm/kg (ref 275–295)

## 2015-06-16 LAB — HIV ANTIBODY (ROUTINE TESTING W REFLEX): HIV SCREEN 4TH GENERATION: NONREACTIVE

## 2015-06-16 LAB — OCCULT BLOOD X 1 CARD TO LAB, STOOL: Fecal Occult Bld: POSITIVE — AB

## 2015-06-16 MED ORDER — ENOXAPARIN SODIUM 40 MG/0.4ML ~~LOC~~ SOLN: 40.0000 mg | Freq: Every day | SUBCUTANEOUS | Status: DC

## 2015-06-16 MED ORDER — DEXTROSE 5 % IV SOLN
1.0000 g | Freq: Once | INTRAVENOUS | Status: AC
  Administered 2015-06-16: 1 g via INTRAVENOUS
  Filled 2015-06-16: qty 10

## 2015-06-16 MED ORDER — OXYCODONE HCL 5 MG PO TABS: 5.0000 mg | ORAL_TABLET | ORAL | Status: DC | PRN

## 2015-06-16 MED ORDER — CEFTRIAXONE SODIUM 2 G IJ SOLR
2.0000 g | INTRAMUSCULAR | Status: DC
Start: 2015-06-17 — End: 2015-06-25
  Administered 2015-06-17 – 2015-06-23 (×7): 2 g via INTRAVENOUS
  Filled 2015-06-16 (×9): qty 2

## 2015-06-16 MED ORDER — ENOXAPARIN SODIUM 40 MG/0.4ML ~~LOC~~ SOLN
40.0000 mg | Freq: Every day | SUBCUTANEOUS | Status: DC
  Administered 2015-06-16 – 2015-06-21 (×6): 40 mg via SUBCUTANEOUS
  Filled 2015-06-16 (×5): qty 0.4

## 2015-06-16 MED ORDER — ACETAMINOPHEN 500 MG PO TABS
500.0000 mg | ORAL_TABLET | Freq: Four times a day (QID) | ORAL | Status: DC | PRN
  Administered 2015-06-17 – 2015-06-18 (×3): 500 mg via ORAL
  Filled 2015-06-16 (×3): qty 1

## 2015-06-16 MED ORDER — SODIUM CHLORIDE 0.9 % IV BOLUS (SEPSIS)
1000.0000 mL | Freq: Once | INTRAVENOUS | Status: AC
  Administered 2015-06-16: 1000 mL via INTRAVENOUS

## 2015-06-16 MED ORDER — IOHEXOL 300 MG/ML  SOLN
100.0000 mL | Freq: Once | INTRAMUSCULAR | Status: AC | PRN
  Administered 2015-06-16: 100 mL via INTRAVENOUS

## 2015-06-16 MED ORDER — IOHEXOL 300 MG/ML  SOLN
25.0000 mL | INTRAMUSCULAR | Status: AC
  Administered 2015-06-16 (×2): 25 mL via ORAL

## 2015-06-16 MED ORDER — ACETAMINOPHEN 650 MG RE SUPP: 325.0000 mg | Freq: Four times a day (QID) | RECTAL | Status: DC | PRN

## 2015-06-16 NOTE — Progress Notes (Signed)
[place] TEAM 1 - Stepdown/ICU TEAM PROGRESS NOTE  Becky Patterson JIR:[id number] DOB: 1955-10-03 DOA: 06/15/2015 PCP: No primary care provider on file.  Admit HPI / Brief Narrative: 60 y.o. female with a history of smoking and no medical follow up who presented with fever and cough.  2 or 3 weeks ago she started to develop malaise and diarrhea, with occasional emesis and decreased appetite. The day of her admission she developed fever and shakes, so her family made her come to the ER.  In the ED, the patient had a temperature to 102.70F, HR 127 bpm, tachypnea, and low blood pressure. Lactic acid level was initially 3. She had leukocytosis and a chest x-ray that showed a left lower lobe opacity.   Of note, the patient has had increased low back pain for a week, and is felt to have possibly lost 20 pounds in the last several weeks of this illness. She has never had a colonoscopy, but states she has had a mammogram.  Because of the nodular appearance of her left lower lobe opacity, CT was obtained which showed a well-circumscribed nodule 2 cm in diameter in the left lower lobe. In addition radiographs of the low back showed what appeared to be sclerotic lesions of the pelvis.  HPI/Subjective: The patient is resting comfortably in her bed.  She is anxious to be discharged home as soon as possible.  She denies current chest pain fevers chills or shortness of breath.  She does report ongoing nausea.  Assessment/Plan:  Sepsisdue to Gram negative rod bacteremia - ?Pyelo v/s other  CT abdom/pelvis to r/o colitis/abscess/occult infection - f/u urine culture - cont empiric abx   1.8cm superior segment LLL Lung mass  metastasis of unknown primary vs hamartoma vs primary lung CA - Radiology recommends PET/CT as outpt rather than a bx at this time - discussed with patient and explained the importance of further evaluation - if no suspicious lesions are noted on CT scan abdomen and pelvis will defer lung  mass follow-up until outpatient PET scan can be completed  Smoker Counseled patient on the absolute need to discontinue smoking completely and permanently  Anemia of chronic disease  Worrisome for the possibility of an occult malignancy - follow hemoglobin trend  Hyponatremia Quickly corrected with volume depletion - appears to have been due to simple hypovolemia  Hyperglycemia Check A1c  Code Status: FULL Family Communication: Spoke with patient, son, and other family members at bedside Disposition Plan: SDU  Consultants: IR  Procedures: none  Antibiotics: Ceftriaxone 1/22 > Azithro 1/22 Zosyn 1/22 Vanc 1/22  DVT prophylaxis: lovenox   Objective: Blood pressure 121/58, pulse 110, temperature 98.5 F (36.9 C), temperature source Oral, resp. rate 26, height '5\' 2"'$  (1.575 m), weight 91.9 kg (202 lb 9.6 oz), SpO2 96 %.  Intake/Output Summary (Last 24 hours) at 06/16/15 1154 Last data filed at 06/16/15 1100  Gross per 24 hour  Intake   7470 ml  Output   3250 ml  Net   4220 ml   Exam: General: No acute respiratory distress Lungs: Clear to auscultation bilaterally without wheezes or crackles Cardiovascular: Regular rate and rhythm without murmur gallop or rub normal S1 and S2 Abdomen: Nontender, nondistended, soft, bowel sounds positive, no rebound, no ascites, no appreciable mass Extremities: No significant cyanosis, clubbing, or edema bilateral lower extremities  Data Reviewed:  Basic Metabolic Panel:  Recent Labs Lab 06/15/15 1520 06/15/15 2248 06/16/15 0439  NA 127*  --  135  K  3.7  --  3.5  CL 93*  --  104  CO2 22  --  23  GLUCOSE 199*  --  213*  BUN 12  --  13  CREATININE 1.16* 1.04* 0.99  CALCIUM 8.7*  --  7.9*    CBC:  Recent Labs Lab 06/15/15 1520 06/15/15 2248 06/16/15 0439  WBC 22.8* 20.0* 16.4*  NEUTROABS 20.8*  --   --   HGB 11.2* 8.6* 8.7*  HCT 32.6* 24.9* 25.4*  MCV 92.4 92.2 93.0  PLT 230 201 193    Liver Function  Tests:  Recent Labs Lab 06/15/15 1520  AST 30  ALT 30  ALKPHOS 115  BILITOT 0.6  PROT 6.9  ALBUMIN 2.4*   Coags:  Recent Labs Lab 06/16/15 0920  INR 1.52*    Recent Labs Lab 06/16/15 0920  APTT 34    Recent Results (from the past 240 hour(s))  Culture, blood (routine x 2)     Status: None (Preliminary result)   Collection Time: 06/15/15  1:00 PM  Result Value Ref Range Status   Specimen Description BLOOD RIGHT ARM  Final   Special Requests BOTTLES DRAWN AEROBIC AND ANAEROBIC 5 CC  Final   Culture  Setup Time   Final    GRAM NEGATIVE RODS IN BOTH AEROBIC AND ANAEROBIC BOTTLES CRITICAL RESULT CALLED TO, READ BACK BY AND VERIFIED WITH: L. HITT RN [phone] [id number] GREEN R CONFIRMED BY M. CAMPBELL     Culture PENDING  Incomplete   Report Status PENDING  Incomplete  Culture, blood (routine x 2)     Status: None (Preliminary result)   Collection Time: 06/15/15  3:09 PM  Result Value Ref Range Status   Specimen Description BLOOD RIGHT HAND  Final   Special Requests BOTTLES DRAWN AEROBIC ONLY 10CC  Final   Culture  Setup Time   Final    GRAM NEGATIVE RODS AEROBIC BOTTLE ONLY CRITICAL RESULT CALLED TO, READ BACK BY AND VERIFIED WITH: L. HITT RN [phone] 0528 GREEN R CONFIRMED BY M. CAMPBELL     Culture PENDING  Incomplete   Report Status PENDING  Incomplete  MRSA PCR Screening     Status: None   Collection Time: 06/15/15  9:59 PM  Result Value Ref Range Status   MRSA by PCR NEGATIVE NEGATIVE Final    Comment:        The GeneXpert MRSA Assay (FDA approved for NASAL specimens only), is one component of a comprehensive MRSA colonization surveillance program. It is not intended to diagnose MRSA infection nor to guide or monitor treatment for MRSA infections.      Studies:   Recent x-ray studies have been reviewed in detail by the Attending Physician  Scheduled Meds:  Scheduled Meds: . azithromycin  500 mg Intravenous Q24H  . [START ON 06/17/2015] cefTRIAXone  (ROCEPHIN)  IV  2 g Intravenous Q24H  . enoxaparin (LOVENOX) injection  40 mg Subcutaneous Daily  . sodium chloride  3 mL Intravenous Q12H    Time spent on care of this patient: 35 mins   MCCLUNG,JEFFREY T , MD   [facility] Office  [phone] Pager - Text Page per Shea Evans as per below:  On-Call/Text Page:      [email or web address]      password [email or web address]  If 7PM-7AM, please contact night-coverage [email or web address] Password [email or web address] 06/16/2015, 11:54 AM   LOS: 1 day

## 2015-06-16 NOTE — Progress Notes (Signed)
CRITICAL VALUE ALERT  Critical value received:  Blood cx resulted  GRAM NEGATIVE RODS  IN BOTH AEROBIC AND ANAEROBIC BOTTLES  Date of notification:  06/16/2015  Time of notification:  0530  Critical value read back:Yes.    Nurse who received alert:  Trixie Rude   MD notified (1st page):  NP TRH  Time of first page:  0530  MD notified (2nd page):  Time of second page:  Responding MD:   Time MD responded:

## 2015-06-16 NOTE — Progress Notes (Signed)
Pharmacy Antibiotic Follow-up Note  Becky Patterson is a 60 y.o. year-old female admitted on 06/15/2015.  The patient is currently on day #2 of abx for GNR bacteremia.  Assessment/Plan: 45 YOF who presented on 1/22 to the [facility] with back pain x 2 weeks and decreased appetite. Pharmacy initially consulted to dose abx for sepsis and then switched to abx for CAP. Now growing 2/2 GNRs in blood. May need to consider switching abx to even more broad GNR coverage but does not really seem to be at risk for pseudomonas infection or MDR infection so ceftriaxone should be ok. Afebrile, WBC down to 16.4.  Plan: Give extra dose of ceftriaxone 1g to complete 2g dose Adjust ceftriaxone to 2g IV Q24 Monitor clinical picture F/U C&S, abx deescalation / LOT  Consider need to continue azithromycin?   Temp (24hrs), Avg:99.1 F (37.3 C), Min:97.6 F (36.4 C), Max:102.4 F (39.1 C)   Recent Labs Lab 06/15/15 1520 06/15/15 2248 06/16/15 0439  WBC 22.8* 20.0* 16.4*    Recent Labs Lab 06/15/15 1520 06/15/15 2248 06/16/15 0439  CREATININE 1.16* 1.04* 0.99   Estimated Creatinine Clearance: 62.9 mL/min (by C-G formula based on Cr of 0.99).    Allergies  Allergen Reactions  . Codeine Nausea And Vomiting    Antimicrobials this admission: Zosyn 1/22 >> 1/22 Vancomycin 1/22 >> 1/22 Ceftriaxone 1/22 >> Azithromycin 1/22 >>  Levels/dose changes this admission: n/a  Microbiology results: Urine cx 1/22 > sent Blood cx > 2/2 GNRs  Thank you for allowing pharmacy to be a part of this patient's care.  Elenor Quinones, PharmD, BCPS Clinical Pharmacist Pager [phone] 06/16/2015 9:32 AM

## 2015-06-16 NOTE — Care Management Note (Addendum)
Case Management Note  Patient Details  Name: Becky Patterson MRN: [id number] Date of Birth: [birth year]  Subjective/Objective:    Date: 06/16/15 Spoke with patient at the bedside along with family .  Introduced self as Tourist information centre manager and explained role in discharge planning and how to be reached.  Verified patient lives in town, alone with spouse. Expressed potential need for no other DME.  Verified patient anticipates to go home with family, at time of discharge and will have full-time supervision by family at this time to best of their knowledge. Patient denied needing help with their medication.  Patient is driven by spouse to MD appointments.  Verified patient has no PCP , daughter states she would like for patient to see Dr. Park Liter will be calling to get her mother an appt with him. NCM asked daughter if he was taking new patients? She states she was not sure but she will let me know.  NCM called to make follow up appt , she was able to get apt with Lester Kinsman on 2/10 at 10 am in the same office and then will be able to schedule apt with Dr. Moreen Fowler afterwards.    Plan: CM will continue to follow for discharge planning and [facility] resources.                 Action/Plan:   Expected Discharge Date:                  Expected Discharge Plan:  Home/Self Care  In-House Referral:     Discharge planning Services  CM Consult  Post Acute Care Choice:    Choice offered to:     DME Arranged:    DME Agency:     HH Arranged:    HH Agency:     Status of Service:  In process, will continue to follow  Medicare Important Message Given:    Date Medicare IM Given:    Medicare IM give by:    Date Additional Medicare IM Given:    Additional Medicare Important Message give by:     If discussed at [place] of Stay Meetings, dates discussed:    Additional Comments:  Zenon Mayo, RN 06/16/2015, 2:04 PM

## 2015-06-16 NOTE — Progress Notes (Addendum)
Patient ID: Becky Patterson, female   DOB: 1955/10/17, 60 y.o.   MRN: [id number]   Request for Left lung mass biopsy has been received in Int Rad  Dr Vernard Gambles has reviewed imaging Rec: PET before biopsy  Please re request after PET Preferably as OP Call Dr Vernard Gambles with any questions: [phone] Or [phone]  Will report to MD

## 2015-06-17 ENCOUNTER — Inpatient Hospital Stay (HOSPITAL_COMMUNITY): Payer: 59

## 2015-06-17 ENCOUNTER — Encounter (HOSPITAL_COMMUNITY): Payer: Self-pay | Admitting: Radiology

## 2015-06-17 DIAGNOSIS — R739 Hyperglycemia, unspecified: Secondary | ICD-10-CM

## 2015-06-17 DIAGNOSIS — D638 Anemia in other chronic diseases classified elsewhere: Secondary | ICD-10-CM

## 2015-06-17 DIAGNOSIS — A419 Sepsis, unspecified organism: Secondary | ICD-10-CM | POA: Diagnosis present

## 2015-06-17 DIAGNOSIS — E871 Hypo-osmolality and hyponatremia: Secondary | ICD-10-CM | POA: Diagnosis present

## 2015-06-17 DIAGNOSIS — N390 Urinary tract infection, site not specified: Secondary | ICD-10-CM

## 2015-06-17 DIAGNOSIS — R918 Other nonspecific abnormal finding of lung field: Secondary | ICD-10-CM | POA: Diagnosis present

## 2015-06-17 DIAGNOSIS — E876 Hypokalemia: Secondary | ICD-10-CM

## 2015-06-17 DIAGNOSIS — R7881 Bacteremia: Secondary | ICD-10-CM | POA: Diagnosis present

## 2015-06-17 DIAGNOSIS — J984 Other disorders of lung: Secondary | ICD-10-CM

## 2015-06-17 DIAGNOSIS — Z720 Tobacco use: Secondary | ICD-10-CM

## 2015-06-17 LAB — CBC
HCT: 25.3 % — ABNORMAL LOW (ref 36.0–46.0)
Hemoglobin: 8.5 g/dL — ABNORMAL LOW (ref 12.0–15.0)
MCH: 30.8 pg (ref 26.0–34.0)
MCHC: 33.6 g/dL (ref 30.0–36.0)
MCV: 91.7 fL (ref 78.0–100.0)
PLATELETS: 200 10*3/uL (ref 150–400)
RBC: 2.76 MIL/uL — AB (ref 3.87–5.11)
RDW: 13.4 % (ref 11.5–15.5)
WBC: 16.5 10*3/uL — ABNORMAL HIGH (ref 4.0–10.5)

## 2015-06-17 LAB — COMPREHENSIVE METABOLIC PANEL
ALBUMIN: 1.9 g/dL — AB (ref 3.5–5.0)
ALT: 27 U/L (ref 14–54)
AST: 20 U/L (ref 15–41)
Alkaline Phosphatase: 79 U/L (ref 38–126)
Anion gap: 9 (ref 5–15)
CHLORIDE: 101 mmol/L (ref 101–111)
CO2: 22 mmol/L (ref 22–32)
CREATININE: 0.85 mg/dL (ref 0.44–1.00)
Calcium: 8.1 mg/dL — ABNORMAL LOW (ref 8.9–10.3)
GFR calc Af Amer: >60 mL/min (ref >60)
GFR calc non Af Amer: >60 mL/min (ref >60)
Glucose, Bld: 120 mg/dL — ABNORMAL HIGH (ref 65–99)
Potassium: 3.4 mmol/L — ABNORMAL LOW (ref 3.5–5.1)
SODIUM: 132 mmol/L — AB (ref 135–145)
Total Bilirubin: 0.3 mg/dL (ref 0.3–1.2)
Total Protein: 5.4 g/dL — ABNORMAL LOW (ref 6.5–8.1)

## 2015-06-17 LAB — PROTIME-INR
INR: 1.45 (ref 0.00–1.49)
Prothrombin Time: 17.7 seconds — ABNORMAL HIGH (ref 11.6–15.2)

## 2015-06-17 LAB — POTASSIUM: Potassium: 3.5 mmol/L (ref 3.5–5.1)

## 2015-06-17 LAB — MAGNESIUM: MAGNESIUM: 1.4 mg/dL — AB (ref 1.7–2.4)

## 2015-06-17 MED ORDER — MAGNESIUM SULFATE 50 % IJ SOLN
3.0000 g | Freq: Once | INTRAVENOUS | Status: AC
  Administered 2015-06-17: 3 g via INTRAVENOUS
  Filled 2015-06-17: qty 6

## 2015-06-17 MED ORDER — IOHEXOL 300 MG/ML  SOLN
50.0000 mL | Freq: Once | INTRAMUSCULAR | Status: AC | PRN
  Administered 2015-06-17: 50 mL via INTRAVENOUS

## 2015-06-17 MED ORDER — POTASSIUM CHLORIDE CRYS ER 20 MEQ PO TBCR
40.0000 meq | EXTENDED_RELEASE_TABLET | Freq: Once | ORAL | Status: AC
  Administered 2015-06-17: 40 meq via ORAL
  Filled 2015-06-17: qty 2

## 2015-06-17 NOTE — Progress Notes (Signed)
[place] TEAM 1 - Stepdown/ICU TEAM Progress Note  Becky Patterson DTO:[id number] DOB: 07-10-1955 DOA: 06/15/2015 PCP: No primary care provider on file.  Admit HPI / Brief Narrative: 60 y.o. WF PMHx Tobacco Abuse   Presented with fever and cough. 2 or 3 weeks ago she started to develop malaise and diarrhea, with occasional emesis and decreased appetite. The day of her admission she developed fever and shakes, so her family made her come to the ER.  In the ED, the patient had a temperature to 102.57F, HR 127 bpm, tachypnea, and low blood pressure. Lactic acid level was initially 3. She had leukocytosis and a chest x-ray that showed a left lower lobe opacity.   Of note, the patient has had increased low back pain for a week, and is felt to have possibly lost 20 pounds in the last several weeks of this illness. She has never had a colonoscopy, but states she has had a mammogram. Because of the nodular appearance of her left lower lobe opacity, CT was obtained which showed a well-circumscribed nodule 2 cm in diameter in the left lower lobe. In addition radiographs of the low back showed what appeared to be sclerotic lesions of the pelvis.   HPI/Subjective: 1/24 MAXIMUM TEMPERATURE overnight 38.5C, A/O 4, NAD, negative dysuria, negative abdominal pain, negative N/V  Assessment/Plan: Sepsisdue to positive Gram negative rod bacteremia - ?Pyelo v/s other  -CT abdom/pelvis to r/o colitis/abscess/occult infection  - f/u urine culture pending - cont empiric abx ; leukocytosis improving but still elevated  UT I positive Gram negative rod -Most likely cause of sepsis, continue current empiric antibiotics  1.8cm superior segment LLL Lung mass  -metastasis of unknown primary vs hamartoma vs primary lung CA - Radiology recommends PET/CT as outpt rather than a bx at this time - discussed with patient and family and explained the importance of further evaluation. Currently patient states will not  obtain biopsy, therefore no need for outpatient PET/CT.  -Readdress prior to discharge and if patient changes her mind ensure PET/CT scheduled -Obtain CT scan head R/O metastasis  Tobacco Abuse Smoker -Patient continues to smoke. -Counseled patient on the absolute need to discontinue smoking completely and permanently  Anemia of chronic disease  -Worrisome for the possibility of an occult malignancy - follow hemoglobin trend  Hyponatremia -Quickly corrected with volume depletion - appears to have been due to simple hypovolemia  Hyperglycemia -Hemoglobin A1c pending   Hypokalemia -Potassium K-Dur 40 mEq    Code Status: FULL Family Communication: no family present at time of exam Disposition Plan: PET/CT?    Consultants: IR  Procedure/Significant Events: 1/22 CT chest WO contrast;- circumscribed nodule superior segment left lower lobe measuring 1.8 cm diameter. Suspicious for malignancy, 1/23 CT abdomen pelvis with contrast;Findings consistent with pyelonephritis which appears Lt>>>Rt.-Negative for colitis. -S/P cholecystectomy. -Diverticulosis without diverticulitis.  Culture 1/22 blood right arm/hand positive GNR 1/22 urine positive GNR 1/22 MRSA by PCR negative   Antibiotics: Azithro 1/23 1 dose Ceftriaxone 1/23 > Zosyn 1/22 1 dose Vanc 1/22 2 doses  DVT prophylaxis: Lovenox   Devices NA   LINES / TUBES:  NA    Continuous Infusions: . sodium chloride 100 mL/hr at 06/17/15 0700    Objective: VITAL SIGNS: Temp: 98.5 F (36.9 C) (01/24 1100) Temp Source: Oral (01/24 1100) BP: 138/76 mmHg (01/24 0705) Pulse Rate: 100 (01/24 0705) SPO2; FIO2:   Intake/Output Summary (Last 24 hours) at 06/17/15 1340 Last data filed at 06/17/15 1200  Gross per  24 hour  Intake 5912.17 ml  Output   3325 ml  Net 2587.17 ml     Exam: General:A/O 4, NAD,, No acute respiratory distress Eyes: Negative headache, negative scleral hemorrhage ENT: Negative Runny  nose, negative gingival bleeding, Neck:  Negative scars, masses, torticollis, lymphadenopathy, JVD Lungs: Clear to auscultation bilaterally without wheezes or crackles Cardiovascular: Regular rate and rhythm without murmur gallop or rub normal S1 and S2 Abdomen:negative abdominal pain, nondistended, positive soft, bowel sounds, no rebound, no ascites, no appreciable mass, positive right CVA tenderness Extremities: No significant cyanosis, clubbing, or edema bilateral lower extremities Psychiatric:  Negative depression, negative anxiety, negative fatigue, negative mania  Neurologic:  Cranial nerves II through XII intact, tongue/uvula midline, all extremities muscle strength 5/5, sensation intact throughout, negative dysarthria, negative expressive aphasia, negative receptive aphasia.   Data Reviewed: Basic Metabolic Panel:  Recent Labs Lab 06/15/15 1520 06/15/15 2248 06/16/15 0439 06/17/15 0527  NA 127*  --  135 132*  K 3.7  --  3.5 3.4*  CL 93*  --  104 101  CO2 22  --  23 22  GLUCOSE 199*  --  213* 120*  BUN 12  --  13 <5*  CREATININE 1.16* 1.04* 0.99 0.85  CALCIUM 8.7*  --  7.9* 8.1*   Liver Function Tests:  Recent Labs Lab 06/15/15 1520 06/17/15 0527  AST 30 20  ALT 30 27  ALKPHOS 115 79  BILITOT 0.6 0.3  PROT 6.9 5.4*  ALBUMIN 2.4* 1.9*   No results for input(s): LIPASE, AMYLASE in the last 168 hours. No results for input(s): AMMONIA in the last 168 hours. CBC:  Recent Labs Lab 06/15/15 1520 06/15/15 2248 06/16/15 0439 06/17/15 0527  WBC 22.8* 20.0* 16.4* 16.5*  NEUTROABS 20.8*  --   --   --   HGB 11.2* 8.6* 8.7* 8.5*  HCT 32.6* 24.9* 25.4* 25.3*  MCV 92.4 92.2 93.0 91.7  PLT 230 201 193 200   Cardiac Enzymes: No results for input(s): CKTOTAL, CKMB, CKMBINDEX, TROPONINI in the last 168 hours. BNP (last 3 results) No results for input(s): BNP in the last 8760 hours.  ProBNP (last 3 results) No results for input(s): PROBNP in the last 8760  hours.  CBG: No results for input(s): GLUCAP in the last 168 hours.  Recent Results (from the past 240 hour(s))  Culture, blood (routine x 2)     Status: None (Preliminary result)   Collection Time: 06/15/15  1:00 PM  Result Value Ref Range Status   Specimen Description BLOOD RIGHT ARM  Final   Special Requests BOTTLES DRAWN AEROBIC AND ANAEROBIC 5 CC  Final   Culture  Setup Time   Final    GRAM NEGATIVE RODS IN BOTH AEROBIC AND ANAEROBIC BOTTLES CRITICAL RESULT CALLED TO, READ BACK BY AND VERIFIED WITH: L. HITT RN [phone] 0526 GREEN R CONFIRMED BY M. CAMPBELL     Culture   Final    GRAM NEGATIVE RODS CULTURE REINCUBATED FOR BETTER GROWTH    Report Status PENDING  Incomplete  Culture, blood (routine x 2)     Status: None (Preliminary result)   Collection Time: 06/15/15  3:09 PM  Result Value Ref Range Status   Specimen Description BLOOD RIGHT HAND  Final   Special Requests BOTTLES DRAWN AEROBIC ONLY 10CC  Final   Culture  Setup Time   Final    GRAM NEGATIVE RODS AEROBIC BOTTLE ONLY CRITICAL RESULT CALLED TO, READ BACK BY AND VERIFIED WITH: L. HITT RN  [id number] [place]     Culture   Final    GRAM NEGATIVE RODS CULTURE REINCUBATED FOR BETTER GROWTH    Report Status PENDING  Incomplete  Urine culture     Status: None (Preliminary result)   Collection Time: 06/15/15  4:10 PM  Result Value Ref Range Status   Specimen Description URINE, RANDOM  Final   Special Requests NONE  Final   Culture   Final    >=100,000 COLONIES/mL GRAM NEGATIVE RODS IDENTIFICATION AND SUSCEPTIBILITIES TO FOLLOW    Report Status PENDING  Incomplete  MRSA PCR Screening     Status: None   Collection Time: 06/15/15  9:59 PM  Result Value Ref Range Status   MRSA by PCR NEGATIVE NEGATIVE Final    Comment:        The GeneXpert MRSA Assay (FDA approved for NASAL specimens only), is one component of a comprehensive MRSA colonization surveillance program. It is not intended  to diagnose MRSA infection nor to guide or monitor treatment for MRSA infections.   C difficile quick scan w PCR reflex     Status: None   Collection Time: 06/16/15 10:54 AM  Result Value Ref Range Status   C Diff antigen NEGATIVE NEGATIVE Final   C Diff toxin NEGATIVE NEGATIVE Final   C Diff interpretation Negative for toxigenic C. difficile  Final     Studies:  Recent x-ray studies have been reviewed in detail by the Attending Physician  Scheduled Meds:  Scheduled Meds: . cefTRIAXone (ROCEPHIN)  IV  2 g Intravenous Q24H  . enoxaparin (LOVENOX) injection  40 mg Subcutaneous Daily  . sodium chloride  3 mL Intravenous Q12H    Time spent on care of this patient: 40 mins   WOODS, Geraldo Docker , MD  [facility] Office  [phone] Pager [phone]  On-Call/Text Page:      [email or web address]      password [email or web address]  If 7PM-7AM, please contact night-coverage [email or web address] Password [email or web address] 06/17/2015, 1:40 PM   LOS: 2 days   Care during the described time interval was provided by me .  I have reviewed this patient's available data, including medical history, events of note, physical examination, and all test results as part of my evaluation. I have personally reviewed and interpreted all radiology studies.   Dia Crawford, MD [phone] Pager

## 2015-06-18 LAB — URINE CULTURE: Culture: >=100000

## 2015-06-18 LAB — HEMOGLOBIN A1C
Hgb A1c MFr Bld: 6.9 % — ABNORMAL HIGH (ref 4.8–5.6)
MEAN PLASMA GLUCOSE: 151 mg/dL

## 2015-06-18 MED ORDER — MAGNESIUM SULFATE 2 GM/50ML IV SOLN
2.0000 g | Freq: Once | INTRAVENOUS | Status: AC
  Administered 2015-06-18: 2 g via INTRAVENOUS
  Filled 2015-06-18: qty 50

## 2015-06-18 MED ORDER — ACETAMINOPHEN 650 MG RE SUPP: 325.0000 mg | Freq: Four times a day (QID) | RECTAL | Status: DC | PRN

## 2015-06-18 MED ORDER — ACETAMINOPHEN 500 MG PO TABS
1000.0000 mg | ORAL_TABLET | Freq: Four times a day (QID) | ORAL | Status: DC | PRN
  Administered 2015-06-18: 1000 mg via ORAL
  Administered 2015-06-18: 500 mg via ORAL
  Administered 2015-06-19 – 2015-06-26 (×10): 1000 mg via ORAL
  Filled 2015-06-18 (×13): qty 2

## 2015-06-18 NOTE — Progress Notes (Signed)
Becky Patterson  Becky Patterson HBZ:[id number] DOB: 10-30-1955 DOA: 06/15/2015 PCP: No primary care provider on file.  Admit HPI / Brief Narrative: 60 y.o. WF PMHx Tobacco Abuse   Presented with fever and cough. 2 or 3 weeks ago she started to develop malaise and diarrhea, with occasional emesis and decreased appetite. The day of her admission she developed fever and shakes, so her family made her come to the ER.  In the ED, the patient had a temperature to 102.81F, HR 127 bpm, tachypnea, and low blood pressure. Lactic acid level was initially 3. She had leukocytosis and a chest x-ray that showed a left lower lobe opacity.   Of Patterson, the patient has had increased low back pain for a week, and is felt to have possibly lost 20 pounds in the last several weeks of this illness. She has never had a colonoscopy, but states she has had a mammogram. Because of the nodular appearance of her left lower lobe opacity, CT was obtained which showed a well-circumscribed nodule 2 cm in diameter in the left lower lobe. In addition radiographs of the low back showed what appeared to be sclerotic lesions of the pelvis.   HPI/S ubjective:  fevers resolved. Mild pain in "kidneys". No dysuria. No vomiting, diarrhea or cough.   Assessment/Plan: Sepsisdue to  Enterococcus UTI/ Pyelo and bacteremia   -Most likely cause of sepsis, continue Rocephin to which it is sensitive- repeat blood cultures to ensure she is clearing them  1.8cm superior segment LLL Lung massand liver mass -metastasis of unknown primary vs hamartoma vs primary lung CA - Radiology recommends PET/CT as outpt rather than a bx at this time - discussed with patient and family and explained the importance of further evaluation. Currently patient states will not obtain biopsy, therefore no need for outpatient PET/CT.  - CT scan head negative for metastasis  Tobacco Abuse Smoker -Patient continues to  smoke. -Counseled patient on the absolute need to discontinue smoking completely and permanently  Anemia of chronic disease  -Worrisome for the possibility of an occult malignancy - follow hemoglobin trend  Hyponatremia -Quickly corrected with volume depletion - appears to have been due to simple hypovolemia  Hyperglycemia -Hemoglobin A1c 6.9- will start diabetes teaching  Hypomagnesemia - replace and recheck tomorrow  Hypokalemia - improved    Code Status: FULL Family Communication: no family present at time of exam Disposition Plan: PET/CT?    Consultants: IR  Procedure/Significant Events: 1/22 CT chest WO contrast;- circumscribed nodule superior segment left lower lobe measuring 1.8 cm diameter. Suspicious for malignancy, 1/23 CT abdomen pelvis with contrast;Findings consistent with pyelonephritis which appears Lt>>>Rt.-Negative for colitis. -S/P cholecystectomy. -Diverticulosis without diverticulitis.   Antibiotics: Azithro 1/23 1 dose Ceftriaxone 1/23 >>> Zosyn 1/22 1 dose Vanc 1/22 2 doses  DVT prophylaxis: Lovenox   Devices NA   LINES / TUBES:  NA    Continuous Infusions: . sodium chloride 100 mL/hr at 06/18/15 0852    Objective: VITAL SIGNS: Temp: 99 F (37.2 C) (01/25 1057) Temp Source: Oral (01/25 1057) BP: 123/58 mmHg (01/25 1055) Pulse Rate: 91 (01/25 1055) SPO2; FIO2:   Intake/Output Summary (Last 24 hours) at 06/18/15 1231 Last data filed at 06/18/15 1200  Gross per 24 hour  Intake   3480 ml  Output   2900 ml  Net    580 ml     Exam: General:A/O 4, NAD,, No acute respiratory distress Eyes: Negative headache, negative scleral  hemorrhage ENT: Negative Runny nose, negative gingival bleeding, Neck:  Negative scars, masses, torticollis, lymphadenopathy, JVD Lungs: Clear to auscultation bilaterally without wheezes or crackles Cardiovascular: Regular rate and rhythm without murmur gallop or rub normal S1 and  S2 Abdomen:negative abdominal pain, nondistended, positive soft, bowel sounds, no rebound, no ascites, no appreciable mass, positive right CVA tenderness Extremities: No significant cyanosis, clubbing, or edema bilateral lower extremities Psychiatric:  Negative depression, negative anxiety, negative fatigue, negative mania  Neurologic:  Cranial nerves II through XII intact, tongue/uvula midline, all extremities muscle strength 5/5, sensation intact throughout, negative dysarthria, negative expressive aphasia, negative receptive aphasia.   Data Reviewed: Basic Metabolic Panel:  Recent Labs Lab 06/15/15 1520 06/15/15 2248 06/16/15 0439 06/17/15 0527 06/17/15 1315  NA 127*  --  135 132*  --   K 3.7  --  3.5 3.4* 3.5  CL 93*  --  104 101  --   CO2 22  --  23 22  --   GLUCOSE 199*  --  213* 120*  --   BUN 12  --  13 <5*  --   CREATININE 1.16* 1.04* 0.99 0.85  --   CALCIUM 8.7*  --  7.9* 8.1*  --   MG  --   --   --   --  1.4*   Liver Function Tests:  Recent Labs Lab 06/15/15 1520 06/17/15 0527  AST 30 20  ALT 30 27  ALKPHOS 115 79  BILITOT 0.6 0.3  PROT 6.9 5.4*  ALBUMIN 2.4* 1.9*   No results for input(s): LIPASE, AMYLASE in the last 168 hours. No results for input(s): AMMONIA in the last 168 hours. CBC:  Recent Labs Lab 06/15/15 1520 06/15/15 2248 06/16/15 0439 06/17/15 0527  WBC 22.8* 20.0* 16.4* 16.5*  NEUTROABS 20.8*  --   --   --   HGB 11.2* 8.6* 8.7* 8.5*  HCT 32.6* 24.9* 25.4* 25.3*  MCV 92.4 92.2 93.0 91.7  PLT 230 201 193 200   Cardiac Enzymes: No results for input(s): CKTOTAL, CKMB, CKMBINDEX, TROPONINI in the last 168 hours. BNP (last 3 results) No results for input(s): BNP in the last 8760 hours.  ProBNP (last 3 results) No results for input(s): PROBNP in the last 8760 hours.  CBG: No results for input(s): GLUCAP in the last 168 hours.  Recent Results (from the past 240 hour(s))  Culture, blood (routine x 2)     Status: None (Preliminary  result)   Collection Time: 06/15/15  1:00 PM  Result Value Ref Range Status   Specimen Description BLOOD RIGHT ARM  Final   Special Requests BOTTLES DRAWN AEROBIC AND ANAEROBIC 5 CC  Final   Culture  Setup Time   Final    GRAM NEGATIVE RODS IN BOTH AEROBIC AND ANAEROBIC BOTTLES CRITICAL RESULT CALLED TO, READ BACK BY AND VERIFIED WITH: L. HITT RN [phone] 0526 GREEN R CONFIRMED BY M. CAMPBELL     Culture   Final    GRAM NEGATIVE RODS CULTURE REINCUBATED FOR BETTER GROWTH    Report Status PENDING  Incomplete  Culture, blood (routine x 2)     Status: None (Preliminary result)   Collection Time: 06/15/15  3:09 PM  Result Value Ref Range Status   Specimen Description BLOOD RIGHT HAND  Final   Special Requests BOTTLES DRAWN AEROBIC ONLY 10CC  Final   Culture  Setup Time   Final    GRAM NEGATIVE RODS AEROBIC BOTTLE ONLY CRITICAL RESULT CALLED TO, READ BACK BY  AND VERIFIED WITH: L. HITT RN [phone] 0528 GREEN R CONFIRMED BY M. CAMPBELL     Culture   Final    GRAM NEGATIVE RODS CULTURE REINCUBATED FOR BETTER GROWTH    Report Status PENDING  Incomplete  Urine culture     Status: None   Collection Time: 06/15/15  4:10 PM  Result Value Ref Range Status   Specimen Description URINE, RANDOM  Final   Special Requests NONE  Final   Culture >=100,000 COLONIES/mL ENTEROBACTER CLOACAE  Final   Report Status 06/18/2015 FINAL  Final   Organism ID, Bacteria ENTEROBACTER CLOACAE  Final      Susceptibility   Enterobacter cloacae - MIC*    CEFAZOLIN <=4 RESISTANT Resistant     CEFTRIAXONE <=1 SENSITIVE Sensitive     CIPROFLOXACIN <=0.25 SENSITIVE Sensitive     GENTAMICIN <=1 SENSITIVE Sensitive     IMIPENEM <=0.25 SENSITIVE Sensitive     NITROFURANTOIN <=16 SENSITIVE Sensitive     TRIMETH/SULFA <=20 SENSITIVE Sensitive     PIP/TAZO <=4 SENSITIVE Sensitive     * >=100,000 COLONIES/mL ENTEROBACTER CLOACAE  MRSA PCR Screening     Status: None   Collection Time: 06/15/15  9:59 PM  Result Value  Ref Range Status   MRSA by PCR NEGATIVE NEGATIVE Final    Comment:        The GeneXpert MRSA Assay (FDA approved for NASAL specimens only), is one component of a comprehensive MRSA colonization surveillance program. It is not intended to diagnose MRSA infection nor to guide or monitor treatment for MRSA infections.   C difficile quick scan w PCR reflex     Status: None   Collection Time: 06/16/15 10:54 AM  Result Value Ref Range Status   C Diff antigen NEGATIVE NEGATIVE Final   C Diff toxin NEGATIVE NEGATIVE Final   C Diff interpretation Negative for toxigenic C. difficile  Final     Studies:  Recent x-ray studies have been reviewed in detail by the Attending Physician  Scheduled Meds:  Scheduled Meds: . cefTRIAXone (ROCEPHIN)  IV  2 g Intravenous Q24H  . enoxaparin (LOVENOX) injection  40 mg Subcutaneous Daily  . sodium chloride  3 mL Intravenous Q12H    Time spent on care of this patient: 20 mins   [facility] , MD  [facility] Office  [phone] Pager - [phone]  On-Call/Text Page:      [email or web address]      password [email or web address]  If 7PM-7AM, please contact night-coverage [email or web address] Password [facility] 06/18/2015, 12:31 PM   LOS: 3 days

## 2015-06-18 NOTE — Progress Notes (Signed)
Report called to [facility] on 6N.

## 2015-06-18 NOTE — Progress Notes (Signed)
Pt's BP=96/47; rechecked manually 96/58. Pt asymptomatic and not in any distress. Covering on call notified. No new orders received at this time. Will continue to monitor pt.

## 2015-06-19 DIAGNOSIS — N10 Acute tubulo-interstitial nephritis: Secondary | ICD-10-CM

## 2015-06-19 DIAGNOSIS — B952 Enterococcus as the cause of diseases classified elsewhere: Secondary | ICD-10-CM

## 2015-06-19 DIAGNOSIS — B9689 Other specified bacterial agents as the cause of diseases classified elsewhere: Secondary | ICD-10-CM

## 2015-06-19 DIAGNOSIS — R7881 Bacteremia: Secondary | ICD-10-CM

## 2015-06-19 LAB — CBC
HCT: 25.2 % — ABNORMAL LOW (ref 36.0–46.0)
HEMATOCRIT: 24.7 % — AB (ref 36.0–46.0)
HEMOGLOBIN: 8.7 g/dL — AB (ref 12.0–15.0)
HEMOGLOBIN: 8.5 g/dL — AB (ref 12.0–15.0)
MCH: 31.8 pg (ref 26.0–34.0)
MCH: 31.7 pg (ref 26.0–34.0)
MCHC: 34.5 g/dL (ref 30.0–36.0)
MCHC: 34.4 g/dL (ref 30.0–36.0)
MCV: 92 fL (ref 78.0–100.0)
MCV: 92.2 fL (ref 78.0–100.0)
PLATELETS: 238 10*3/uL (ref 150–400)
Platelets: 242 10*3/uL (ref 150–400)
RBC: 2.74 MIL/uL — AB (ref 3.87–5.11)
RBC: 2.68 MIL/uL — AB (ref 3.87–5.11)
RDW: 13.6 % (ref 11.5–15.5)
RDW: 13.4 % (ref 11.5–15.5)
WBC: 23.1 10*3/uL — AB (ref 4.0–10.5)
WBC: 22 10*3/uL — AB (ref 4.0–10.5)

## 2015-06-19 LAB — BASIC METABOLIC PANEL
ANION GAP: 12 (ref 5–15)
BUN: <5 mg/dL — ABNORMAL LOW (ref 6–20)
CALCIUM: 8.1 mg/dL — AB (ref 8.9–10.3)
CO2: 22 mmol/L (ref 22–32)
Chloride: 100 mmol/L — ABNORMAL LOW (ref 101–111)
Creatinine, Ser: 0.97 mg/dL (ref 0.44–1.00)
Glucose, Bld: 150 mg/dL — ABNORMAL HIGH (ref 65–99)
POTASSIUM: 3.5 mmol/L (ref 3.5–5.1)
Sodium: 134 mmol/L — ABNORMAL LOW (ref 135–145)

## 2015-06-19 LAB — MAGNESIUM: MAGNESIUM: 1.9 mg/dL (ref 1.7–2.4)

## 2015-06-19 MED ORDER — TEMAZEPAM 7.5 MG PO CAPS
7.5000 mg | ORAL_CAPSULE | Freq: Every day | ORAL | Status: DC
  Administered 2015-06-19 – 2015-06-21 (×3): 7.5 mg via ORAL
  Filled 2015-06-19 (×3): qty 1

## 2015-06-19 MED ORDER — METRONIDAZOLE IN NACL 5-0.79 MG/ML-% IV SOLN
500.0000 mg | Freq: Three times a day (TID) | INTRAVENOUS | Status: DC
Start: 2015-06-19 — End: 2015-06-20
  Administered 2015-06-19 – 2015-06-20 (×4): 500 mg via INTRAVENOUS
  Filled 2015-06-19 (×7): qty 100

## 2015-06-19 MED ORDER — TRAMADOL HCL 50 MG PO TABS
50.0000 mg | ORAL_TABLET | Freq: Four times a day (QID) | ORAL | Status: DC | PRN
  Administered 2015-06-19: 100 mg via ORAL
  Filled 2015-06-19: qty 2

## 2015-06-19 NOTE — Consult Note (Signed)
[place] for Infectious Disease       Reason for Consult: persistent bacteremia    Referring Physician: Dr. Wynelle Cleveland  Active Problems:   Lung mass   Sepsis secondary to UTI [facility])   Mass of lower lobe of left lung   Tobacco abuse   Anemia of chronic disease   Hyponatremia   Hyperglycemia   Hypokalemia   Enterococcal bacteremia   . cefTRIAXone (ROCEPHIN)  IV  2 g Intravenous Q24H  . enoxaparin (LOVENOX) injection  40 mg Subcutaneous Daily  . sodium chloride  3 mL Intravenous Q12H    Recommendations: Repeat blood cultures Will add flagyl  Assessment: She has Enterobacter cloacae, now growing 2 different species with ID tomorrow.  Has pyelonephritis noted on CT scan with stranding but I am concerned of intraabdominal process, ? Colon cancer.    Antibiotics: ceftriaxone  HPI: Becky Patterson is a 60 y.o. female with long history of smoking who initially developed acute n/v/d and diagnosed with influenza who came in with malaise, fever, poor po.  Fever to 102.4, tachypneic, elevated lactic acid.  Started on empiric vancomycin and zosyn and then blood culture with Enterobacter cloacae.  Now though 2/2 repeat blood cultures with 2 other organisms, not yet identified.  Still with fever though overall improvement.  No sick contacts, no history of colonoscopy.  Smokes.  No associated weight loss except a small amount associated with her ILI. CT abd independently reviewed and stranding noted.   Review of Systems:  Constitutional: negative for sweats Cardiovascular: negative for chest pain All other systems reviewed and are negative   History reviewed. No pertinent past medical history.  Social History  Substance Use Topics  . Smoking status: Current Every Day Smoker  . Smokeless tobacco: None  . Alcohol Use: No    Family History  Problem Relation Age of Onset  . Lung cancer Mother   . Liver cancer Brother   . Heart attack Brother     Allergies  Allergen  Reactions  . Codeine Nausea And Vomiting    Physical Exam: Constitutional: in no apparent distress and alert  Filed Vitals:   06/18/15 2235 06/19/15 0505  BP: 96/58 121/62  Pulse:  98  Temp:  99.2 F (37.3 C)  Resp:  18   EYES: anicteric ENMT: no thrush Cardiovascular: Cor RRR and No murmurs Respiratory: CTA B; normal respiratory effort GI: Bowel sounds are normal, liver is not enlarged, spleen is not enlarged Musculoskeletal: no pedal edema noted Skin: negatives: no rash Hematologic: no cervical lad  Lab Results  Component Value Date   WBC 22.0* 06/19/2015   HGB 8.5* 06/19/2015   HCT 24.7* 06/19/2015   MCV 92.2 06/19/2015   PLT 242 06/19/2015    Lab Results  Component Value Date   CREATININE 0.97 06/19/2015   BUN <5* 06/19/2015   NA 134* 06/19/2015   K 3.5 06/19/2015   CL 100* 06/19/2015   CO2 22 06/19/2015    Lab Results  Component Value Date   ALT 27 06/17/2015   AST 20 06/17/2015   ALKPHOS 79 06/17/2015     Microbiology: Recent Results (from the past 240 hour(s))  Culture, blood (routine x 2)     Status: None (Preliminary result)   Collection Time: 06/15/15  1:00 PM  Result Value Ref Range Status   Specimen Description BLOOD RIGHT ARM  Final   Special Requests BOTTLES DRAWN AEROBIC AND ANAEROBIC 5 CC  Final   Culture  Setup Time   Final    GRAM NEGATIVE RODS IN BOTH AEROBIC AND ANAEROBIC BOTTLES CRITICAL RESULT CALLED TO, READ BACK BY AND VERIFIED WITH: L. HITT RN [phone] 0526 GREEN R CONFIRMED BY M. CAMPBELL     Culture GRAM NEGATIVE RODS  Final   Report Status PENDING  Incomplete  Culture, blood (routine x 2)     Status: None (Preliminary result)   Collection Time: 06/15/15  3:09 PM  Result Value Ref Range Status   Specimen Description BLOOD RIGHT HAND  Final   Special Requests BOTTLES DRAWN AEROBIC ONLY 10CC  Final   Culture  Setup Time   Final    GRAM NEGATIVE RODS AEROBIC BOTTLE ONLY CRITICAL RESULT CALLED TO, READ BACK BY AND VERIFIED  WITH: L. HITT RN [phone] [id number] GREEN R CONFIRMED BY M. CAMPBELL     Culture   Final    GRAM NEGATIVE RODS IDENTIFICATION AND SUSCEPTIBILITIES TO FOLLOW    Report Status PENDING  Incomplete  Urine culture     Status: None   Collection Time: 06/15/15  4:10 PM  Result Value Ref Range Status   Specimen Description URINE, RANDOM  Final   Special Requests NONE  Final   Culture >=100,000 COLONIES/mL ENTEROBACTER CLOACAE  Final   Report Status 06/18/2015 FINAL  Final   Organism ID, Bacteria ENTEROBACTER CLOACAE  Final      Susceptibility   Enterobacter cloacae - MIC*    CEFAZOLIN <=4 RESISTANT Resistant     CEFTRIAXONE <=1 SENSITIVE Sensitive     CIPROFLOXACIN <=0.25 SENSITIVE Sensitive     GENTAMICIN <=1 SENSITIVE Sensitive     IMIPENEM <=0.25 SENSITIVE Sensitive     NITROFURANTOIN <=16 SENSITIVE Sensitive     TRIMETH/SULFA <=20 SENSITIVE Sensitive     PIP/TAZO <=4 SENSITIVE Sensitive     * >=100,000 COLONIES/mL ENTEROBACTER CLOACAE  MRSA PCR Screening     Status: None   Collection Time: 06/15/15  9:59 PM  Result Value Ref Range Status   MRSA by PCR NEGATIVE NEGATIVE Final    Comment:        The GeneXpert MRSA Assay (FDA approved for NASAL specimens only), is one component of a comprehensive MRSA colonization surveillance program. It is not intended to diagnose MRSA infection nor to guide or monitor treatment for MRSA infections.   C difficile quick scan w PCR reflex     Status: None   Collection Time: 06/16/15 10:54 AM  Result Value Ref Range Status   C Diff antigen NEGATIVE NEGATIVE Final   C Diff toxin NEGATIVE NEGATIVE Final   C Diff interpretation Negative for toxigenic C. difficile  Final  Culture, blood (Routine X 2) w Reflex to ID Panel     Status: None (Preliminary result)   Collection Time: 06/18/15  2:00 PM  Result Value Ref Range Status   Specimen Description BLOOD RIGHT HAND  Final   Special Requests BOTTLES DRAWN AEROBIC AND ANAEROBIC 10CC  Final   Culture NO  GROWTH < 24 HOURS  Final   Report Status PENDING  Incomplete  Culture, blood (Routine X 2) w Reflex to ID Panel     Status: None (Preliminary result)   Collection Time: 06/18/15  2:15 PM  Result Value Ref Range Status   Specimen Description BLOOD LEFT ANTECUBITAL  Final   Special Requests BOTTLES DRAWN AEROBIC AND ANAEROBIC 10CC  Final   Culture NO GROWTH < 24 HOURS  Final   Report Status PENDING  Incomplete  Scharlene Gloss, [place] for Infectious Disease [place] [email or web address] [phone] pager  [phone] cell 06/19/2015, 2:12 PM

## 2015-06-19 NOTE — Progress Notes (Signed)
Brief Nutrition Note  RD received consult for DM diet education.   Attempted to speak with pt x 3, however, pt was either receiving nursing card or in with MD at times of visits.   Spoke with RN, who reports pt consumes a lot of sweet tea PTA. Per RN, pt will likely be non-compliant with diet recommendations.   RD will attempt to follow-up on 06/20/15.  Braden Deloach A. Jimmye Norman, RD, LDN, CDE Pager: [phone] After hours Pager: [phone]

## 2015-06-19 NOTE — Progress Notes (Addendum)
[place] TEAM 1 - Stepdown/ICU TEAM Progress Note  Becky Patterson NWG:[id number] DOB: 04/05/1956 DOA: 06/15/2015 PCP: No primary care provider on file.  Admit HPI / Brief Narrative: 60 y.o. WF PMHx Tobacco Abuse   Presented with fever and cough. 2 or 3 weeks ago she started to develop malaise and diarrhea, with occasional emesis and decreased appetite. The day of her admission she developed fever and shakes, so her family made her come to the ER.  In the ED, the patient had a temperature to 102.68F, HR 127 bpm, tachypnea, and low blood pressure. Lactic acid level was initially 3. She had leukocytosis and a chest x-ray that showed a left lower lobe opacity.   Of note, the patient has had increased low back pain for a week, and is felt to have possibly lost 20 pounds in the last several weeks of this illness. She has never had a colonoscopy, but states she has had a mammogram. Because of the nodular appearance of her left lower lobe opacity, CT was obtained which showed a well-circumscribed nodule 2 cm in diameter in the left lower lobe. In addition radiographs of the low back showed what appeared to be sclerotic lesions of the pelvis.   HPI/S ubjective: Feels well. No fever, cough, pain, dysuria.   Assessment/Plan: Sepsisdue to  Enterococcus UTI/ Pyelo and bacteremia   -Most likely cause of sepsis, continue Rocephin to which it is sensitive- cultures growing a second organism- follow  1.8cm superior segment LLL Lung massand liver mass -metastasis of unknown primary vs hamartoma vs primary lung CA - Radiology recommends PET/CT as outpt rather than a bx at this time - CT scan head negative for metastasis  Tobacco Abuse   -Patient continues to smoke. -Counseled patient on the absolute need to discontinue smoking completely and permanently  Anemia of chronic disease  -Worrisome for the possibility of an occult malignancy - follow hemoglobin trend  Hyponatremia -Quickly corrected  with volume depletion - appears to have been due to simple hypovolemia  Hyperglycemia -Hemoglobin A1c 6.9- will start diabetes teaching  Hypomagnesemia - replaced  Hypokalemia - improved with replacement    Code Status: FULL Family Communication: husband at bedside Disposition Plan: PET/CT?    Consultants: IR  Procedure/Significant Events: 1/22 CT chest WO contrast;- circumscribed nodule superior segment left lower lobe measuring 1.8 cm diameter. Suspicious for malignancy, 1/23 CT abdomen pelvis with contrast;Findings consistent with pyelonephritis which appears Lt>>>Rt.-Negative for colitis. -S/P cholecystectomy. -Diverticulosis without diverticulitis.   Antibiotics: Azithro 1/23 1 dose Ceftriaxone 1/23 >>> Zosyn 1/22 1 dose Vanc 1/22 2 doses  DVT prophylaxis: Lovenox   Devices NA   LINES / TUBES:  NA    Continuous Infusions:    Objective: VITAL SIGNS: Temp: 99.2 F (37.3 C) (01/26 0505) Temp Source: Oral (01/26 0505) BP: 121/62 mmHg (01/26 0505) Pulse Rate: 98 (01/26 0505) SPO2; FIO2:   Intake/Output Summary (Last 24 hours) at 06/19/15 1223 Last data filed at 06/19/15 0900  Gross per 24 hour  Intake   1643 ml  Output   1450 ml  Net    193 ml     Exam: General:A/O 4, NAD,, No acute respiratory distress Eyes: Negative headache, negative scleral hemorrhage ENT: Negative Runny nose, negative gingival bleeding, Neck:  Negative scars, masses, torticollis, lymphadenopathy, JVD Lungs: Clear to auscultation bilaterally without wheezes or crackles Cardiovascular: Regular rate and rhythm without murmur gallop or rub normal S1 and S2 Abdomen:negative abdominal pain, nondistended, positive soft, bowel sounds, no rebound,  no ascites, no appreciable mass, positive right CVA tenderness Extremities: No significant cyanosis, clubbing, or edema bilateral lower extremities Psychiatric:  Negative depression, negative anxiety, negative fatigue, negative  mania  Neurologic:  Cranial nerves II through XII intact, tongue/uvula midline, all extremities muscle strength 5/5, sensation intact throughout, negative dysarthria, negative expressive aphasia, negative receptive aphasia.   Data Reviewed: Basic Metabolic Panel:  Recent Labs Lab 06/15/15 1520 06/15/15 2248 06/16/15 0439 06/17/15 0527 06/17/15 1315 06/19/15 0426  NA 127*  --  135 132*  --  134*  K 3.7  --  3.5 3.4* 3.5 3.5  CL 93*  --  104 101  --  100*  CO2 22  --  23 22  --  22  GLUCOSE 199*  --  213* 120*  --  150*  BUN 12  --  13 <5*  --  <5*  CREATININE 1.16* 1.04* 0.99 0.85  --  0.97  CALCIUM 8.7*  --  7.9* 8.1*  --  8.1*  MG  --   --   --   --  1.4* 1.9   Liver Function Tests:  Recent Labs Lab 06/15/15 1520 06/17/15 0527  AST 30 20  ALT 30 27  ALKPHOS 115 79  BILITOT 0.6 0.3  PROT 6.9 5.4*  ALBUMIN 2.4* 1.9*   No results for input(s): LIPASE, AMYLASE in the last 168 hours. No results for input(s): AMMONIA in the last 168 hours. CBC:  Recent Labs Lab 06/15/15 1520 06/15/15 2248 06/16/15 0439 06/17/15 0527 06/19/15 0426 06/19/15 0912  WBC 22.8* 20.0* 16.4* 16.5* 23.1* 22.0*  NEUTROABS 20.8*  --   --   --   --   --   HGB 11.2* 8.6* 8.7* 8.5* 8.7* 8.5*  HCT 32.6* 24.9* 25.4* 25.3* 25.2* 24.7*  MCV 92.4 92.2 93.0 91.7 92.0 92.2  PLT 230 201 193 200 238 242   Cardiac Enzymes: No results for input(s): CKTOTAL, CKMB, CKMBINDEX, TROPONINI in the last 168 hours. BNP (last 3 results) No results for input(s): BNP in the last 8760 hours.  ProBNP (last 3 results) No results for input(s): PROBNP in the last 8760 hours.  CBG: No results for input(s): GLUCAP in the last 168 hours.  Recent Results (from the past 240 hour(s))  Culture, blood (routine x 2)     Status: None (Preliminary result)   Collection Time: 06/15/15  1:00 PM  Result Value Ref Range Status   Specimen Description BLOOD RIGHT ARM  Final   Special Requests BOTTLES DRAWN AEROBIC AND  ANAEROBIC 5 CC  Final   Culture  Setup Time   Final    GRAM NEGATIVE RODS IN BOTH AEROBIC AND ANAEROBIC BOTTLES CRITICAL RESULT CALLED TO, READ BACK BY AND VERIFIED WITH: L. HITT RN [phone] 0526 GREEN R CONFIRMED BY M. CAMPBELL     Culture GRAM NEGATIVE RODS  Final   Report Status PENDING  Incomplete  Culture, blood (routine x 2)     Status: None (Preliminary result)   Collection Time: 06/15/15  3:09 PM  Result Value Ref Range Status   Specimen Description BLOOD RIGHT HAND  Final   Special Requests BOTTLES DRAWN AEROBIC ONLY 10CC  Final   Culture  Setup Time   Final    GRAM NEGATIVE RODS AEROBIC BOTTLE ONLY CRITICAL RESULT CALLED TO, READ BACK BY AND VERIFIED WITH: L. HITT RN [phone] [place]     Culture   Final    GRAM NEGATIVE RODS  IDENTIFICATION AND SUSCEPTIBILITIES TO FOLLOW    Report Status PENDING  Incomplete  Urine culture     Status: None   Collection Time: 06/15/15  4:10 PM  Result Value Ref Range Status   Specimen Description URINE, RANDOM  Final   Special Requests NONE  Final   Culture >=100,000 COLONIES/mL ENTEROBACTER CLOACAE  Final   Report Status 06/18/2015 FINAL  Final   Organism ID, Bacteria ENTEROBACTER CLOACAE  Final      Susceptibility   Enterobacter cloacae - MIC*    CEFAZOLIN <=4 RESISTANT Resistant     CEFTRIAXONE <=1 SENSITIVE Sensitive     CIPROFLOXACIN <=0.25 SENSITIVE Sensitive     GENTAMICIN <=1 SENSITIVE Sensitive     IMIPENEM <=0.25 SENSITIVE Sensitive     NITROFURANTOIN <=16 SENSITIVE Sensitive     TRIMETH/SULFA <=20 SENSITIVE Sensitive     PIP/TAZO <=4 SENSITIVE Sensitive     * >=100,000 COLONIES/mL ENTEROBACTER CLOACAE  MRSA PCR Screening     Status: None   Collection Time: 06/15/15  9:59 PM  Result Value Ref Range Status   MRSA by PCR NEGATIVE NEGATIVE Final    Comment:        The GeneXpert MRSA Assay (FDA approved for NASAL specimens only), is one component of a comprehensive MRSA  colonization surveillance program. It is not intended to diagnose MRSA infection nor to guide or monitor treatment for MRSA infections.   C difficile quick scan w PCR reflex     Status: None   Collection Time: 06/16/15 10:54 AM  Result Value Ref Range Status   C Diff antigen NEGATIVE NEGATIVE Final   C Diff toxin NEGATIVE NEGATIVE Final   C Diff interpretation Negative for toxigenic C. difficile  Final     Studies:  Recent x-ray studies have been reviewed in detail by the Attending Physician  Scheduled Meds:  Scheduled Meds: . cefTRIAXone (ROCEPHIN)  IV  2 g Intravenous Q24H  . enoxaparin (LOVENOX) injection  40 mg Subcutaneous Daily  . sodium chloride  3 mL Intravenous Q12H    Time spent on care of this patient: 20 mins   [facility] , [place]  [facility] Office  [phone] Pager - [phone]  On-Call/Text Page:      [email or web address]      password [email or web address]  If 7PM-7AM, please contact night-coverage [email or web address] Password [email or web address] 06/19/2015, 12:23 PM   LOS: 4 days

## 2015-06-19 NOTE — Care Management Note (Signed)
Case Management Note  Patient Details  Name: CLOA BUSHONG MRN: [id number] Date of Birth: 15-Feb-1956  Subjective/Objective:                    Action/Plan:  UR updated  Expected Discharge Date:                  Expected Discharge Plan:  Home/Self Care  In-House Referral:     Discharge planning Services  CM Consult  Post Acute Care Choice:    Choice offered to:     DME Arranged:    DME Agency:     HH Arranged:    [place] Agency:     Status of Service:  In process, will continue to follow  Medicare Important Message Given:    Date Medicare IM Given:    Medicare IM give by:    Date Additional Medicare IM Given:    Additional Medicare Important Message give by:     If discussed at [place] of Stay Meetings, dates discussed:    Additional Comments:  Marilu Favre, RN 06/19/2015, 1:49 PM

## 2015-06-20 DIAGNOSIS — R911 Solitary pulmonary nodule: Secondary | ICD-10-CM

## 2015-06-20 DIAGNOSIS — N12 Tubulo-interstitial nephritis, not specified as acute or chronic: Secondary | ICD-10-CM

## 2015-06-20 LAB — CULTURE, BLOOD (ROUTINE X 2)

## 2015-06-20 LAB — CBC WITH DIFFERENTIAL/PLATELET
BASOS ABS: 0 10*3/uL (ref 0.0–0.1)
Basophils Relative: 0 %
EOS ABS: 0.1 10*3/uL (ref 0.0–0.7)
EOS PCT: 1 %
HCT: 24.5 % — ABNORMAL LOW (ref 36.0–46.0)
Hemoglobin: 8.1 g/dL — ABNORMAL LOW (ref 12.0–15.0)
Lymphocytes Relative: 10 %
Lymphs Abs: 1.8 10*3/uL (ref 0.7–4.0)
MCH: 30.6 pg (ref 26.0–34.0)
MCHC: 33.1 g/dL (ref 30.0–36.0)
MCV: 92.5 fL (ref 78.0–100.0)
MONO ABS: 1.1 10*3/uL — AB (ref 0.1–1.0)
Monocytes Relative: 6 %
Neutro Abs: 14.8 10*3/uL — ABNORMAL HIGH (ref 1.7–7.7)
Neutrophils Relative %: 83 %
PLATELETS: 335 10*3/uL (ref 150–400)
RBC: 2.65 MIL/uL — AB (ref 3.87–5.11)
RDW: 13.5 % (ref 11.5–15.5)
WBC: 18 10*3/uL — AB (ref 4.0–10.5)

## 2015-06-20 LAB — CBC
HCT: 24.6 % — ABNORMAL LOW (ref 36.0–46.0)
Hemoglobin: 8.2 g/dL — ABNORMAL LOW (ref 12.0–15.0)
MCH: 30.9 pg (ref 26.0–34.0)
MCHC: 33.3 g/dL (ref 30.0–36.0)
MCV: 92.8 fL (ref 78.0–100.0)
PLATELETS: 301 10*3/uL (ref 150–400)
RBC: 2.65 MIL/uL — AB (ref 3.87–5.11)
RDW: 13.5 % (ref 11.5–15.5)
WBC: 22.2 10*3/uL — ABNORMAL HIGH (ref 4.0–10.5)

## 2015-06-20 NOTE — Plan of Care (Signed)
Problem: Food- and Nutrition-Related Knowledge Deficit (NB-1.1) Goal: Nutrition education Formal process to instruct or train a patient/client in a skill or to impart knowledge to help patients/clients voluntarily manage or modify food choices and eating behavior to maintain or improve health. Outcome: Adequate for Discharge  RD consulted for nutrition education regarding diabetes.     Lab Results  Component Value Date    HGBA1C 6.9* 06/17/2015   Spoke with pt at bedside. She reports her appetite is slowly returning. PTA she reports she was consuming a lot of sweet tea as a result of altered taste perception, which has improved. When she is feeling well, she generally consumes 2-3 meals per day. [name] reports that he has DM and they both generally try to follow a DM diet, however, pt does admit to consuming sweets, sweet tea, and other sugary beverages. They consume a lot of whole grain, fruits, and vegetables. Primary focus of education was discussing low calorie beverage alternatives to help achieve optimal glycemic control.   RD provided "Carbohydrate Counting for People with Diabetes" handout from the Academy of Nutrition and Dietetics. Discussed different food groups and their effects on blood sugar, emphasizing carbohydrate-containing foods. Provided list of carbohydrates and recommended serving sizes of common foods.  Discussed importance of controlled and consistent carbohydrate intake throughout the day. Provided examples of ways to balance meals/snacks and encouraged intake of high-fiber, whole grain complex carbohydrates. Teach back method used.  Expect fair compliance.  Body mass index is 37.05 kg/(m^2). Pt meets criteria for obesity, class II based on current BMI.  Current diet order is Carb Modified, patient is consuming approximately 30-100% of meals at this time. Labs and medications reviewed. No further nutrition interventions warranted at this time. RD contact  information provided. If additional nutrition issues arise, please re-consult RD.  Deiondre Harrower A. Jimmye Norman, RD, LDN, CDE Pager: [phone] After hours Pager: [phone]

## 2015-06-20 NOTE — Progress Notes (Signed)
[place] for Infectious Disease   Reason for visit: Follow up on bacteremia  Interval History: initial report of repeat positive blood cultures actually negative.  No fever, no chills, eating better. Wants to go home  Physical Exam: Constitutional:  Filed Vitals:   06/19/15 2007 06/20/15 0552  BP: 109/54 104/51  Pulse: 93 93  Temp: 98.3 F (36.8 C) 99.9 F (37.7 C)  Resp: 19 19   patient appears in NAD Respiratory: Normal respiratory effort; CTA B Cardiovascular: RRR  Review of Systems: Constitutional: negative for fatigue Gastrointestinal: negative for nausea and diarrhea  Lab Results  Component Value Date   WBC 22.0* 06/19/2015   HGB 8.5* 06/19/2015   HCT 24.7* 06/19/2015   MCV 92.2 06/19/2015   PLT 242 06/19/2015    Lab Results  Component Value Date   CREATININE 0.97 06/19/2015   BUN <5* 06/19/2015   NA 134* 06/19/2015   K 3.5 06/19/2015   CL 100* 06/19/2015   CO2 22 06/19/2015    Lab Results  Component Value Date   ALT 27 06/17/2015   AST 20 06/17/2015   ALKPHOS 79 06/17/2015     Microbiology: Recent Results (from the past 240 hour(s))  Culture, blood (routine x 2)     Status: None   Collection Time: 06/15/15  1:00 PM  Result Value Ref Range Status   Specimen Description BLOOD RIGHT ARM  Final   Special Requests BOTTLES DRAWN AEROBIC AND ANAEROBIC 5 CC  Final   Culture  Setup Time   Final    GRAM NEGATIVE RODS IN BOTH AEROBIC AND ANAEROBIC BOTTLES CRITICAL RESULT CALLED TO, READ BACK BY AND VERIFIED WITH: L. HITT RN [id number] [phone] GREEN R CONFIRMED BY [place]     Culture ENTEROBACTER CLOACAE  Final   Report Status 06/20/2015 FINAL  Final   Organism ID, Bacteria ENTEROBACTER CLOACAE  Final      Susceptibility   Enterobacter cloacae - MIC*    CEFAZOLIN 8 RESISTANT Resistant     CEFEPIME <=1 SENSITIVE Sensitive     CEFTAZIDIME <=1 SENSITIVE Sensitive     CEFTRIAXONE <=1 SENSITIVE Sensitive     CIPROFLOXACIN <=0.25 SENSITIVE Sensitive      GENTAMICIN <=1 SENSITIVE Sensitive     IMIPENEM <=0.25 SENSITIVE Sensitive     TRIMETH/SULFA <=20 SENSITIVE Sensitive     PIP/TAZO <=4 SENSITIVE Sensitive     * ENTEROBACTER CLOACAE  Culture, blood (routine x 2)     Status: None   Collection Time: 06/15/15  3:09 PM  Result Value Ref Range Status   Specimen Description BLOOD RIGHT HAND  Final   Special Requests BOTTLES DRAWN AEROBIC ONLY 10CC  Final   Culture  Setup Time   Final    GRAM NEGATIVE RODS AEROBIC BOTTLE ONLY CRITICAL RESULT CALLED TO, READ BACK BY AND VERIFIED WITH: L. HITT RN [phone] 0528 GREEN R CONFIRMED BY M. CAMPBELL     Culture   Final    ENTEROBACTER CLOACAE SUSCEPTIBILITIES PERFORMED ON PREVIOUS CULTURE WITHIN THE LAST 5 DAYS.    Report Status 06/20/2015 FINAL  Final  Urine culture     Status: None   Collection Time: 06/15/15  4:10 PM  Result Value Ref Range Status   Specimen Description URINE, RANDOM  Final   Special Requests NONE  Final   Culture >=100,000 COLONIES/mL ENTEROBACTER CLOACAE  Final   Report Status 06/18/2015 FINAL  Final   Organism ID, Bacteria ENTEROBACTER CLOACAE  Final  Susceptibility   Enterobacter cloacae - MIC*    CEFAZOLIN <=4 RESISTANT Resistant     CEFTRIAXONE <=1 SENSITIVE Sensitive     CIPROFLOXACIN <=0.25 SENSITIVE Sensitive     GENTAMICIN <=1 SENSITIVE Sensitive     IMIPENEM <=0.25 SENSITIVE Sensitive     NITROFURANTOIN <=16 SENSITIVE Sensitive     TRIMETH/SULFA <=20 SENSITIVE Sensitive     PIP/TAZO <=4 SENSITIVE Sensitive     * >=100,000 COLONIES/mL ENTEROBACTER CLOACAE  MRSA PCR Screening     Status: None   Collection Time: 06/15/15  9:59 PM  Result Value Ref Range Status   MRSA by PCR NEGATIVE NEGATIVE Final    Comment:        The GeneXpert MRSA Assay (FDA approved for NASAL specimens only), is one component of a comprehensive MRSA colonization surveillance program. It is not intended to diagnose MRSA infection nor to guide or monitor treatment for MRSA  infections.   C difficile quick scan w PCR reflex     Status: None   Collection Time: 06/16/15 10:54 AM  Result Value Ref Range Status   C Diff antigen NEGATIVE NEGATIVE Final   C Diff toxin NEGATIVE NEGATIVE Final   C Diff interpretation Negative for toxigenic C. difficile  Final  Culture, blood (Routine X 2) w Reflex to ID Panel     Status: None (Preliminary result)   Collection Time: 06/18/15  2:00 PM  Result Value Ref Range Status   Specimen Description BLOOD RIGHT HAND  Final   Special Requests BOTTLES DRAWN AEROBIC AND ANAEROBIC 10CC  Final   Culture NO GROWTH 2 DAYS  Final   Report Status PENDING  Incomplete  Culture, blood (Routine X 2) w Reflex to ID Panel     Status: None (Preliminary result)   Collection Time: 06/18/15  2:15 PM  Result Value Ref Range Status   Specimen Description BLOOD LEFT ANTECUBITAL  Final   Special Requests BOTTLES DRAWN AEROBIC AND ANAEROBIC 10CC  Final   Culture NO GROWTH 2 DAYS  Final   Report Status PENDING  Incomplete    Impression/Plan:  1. Pyelonephritis - repeat blood cultures are negative, I discussed with micro.  Not growing out despite earlier report of repeat positive.  Ok to go out on cipro 500 mg bid for 7 more days.   2. Lung mass. Nodule noted.  Will need further follow up with her primary physician.

## 2015-06-20 NOTE — Progress Notes (Signed)
[place] TEAM 1 - Stepdown/ICU TEAM Progress Note  Becky Patterson EYC:[id number] DOB: Nov 28, 1955 DOA: 06/15/2015 PCP: No primary care provider on file.  Admit HPI / Brief Narrative: 60 y.o. WF PMHx Tobacco Abuse   Presented with fever and cough. 2 or 3 weeks ago she started to develop malaise and diarrhea, with occasional emesis and decreased appetite. The day of her admission she developed fever and shakes, so her family made her come to the ER.  In the ED, the patient had a temperature to 102.62F, HR 127 bpm, tachypnea, and low blood pressure. Lactic acid level was initially 3. She had leukocytosis and a chest x-ray that showed a left lower lobe opacity.   Of note, the patient has had increased low back pain for a week, and is felt to have possibly lost 20 pounds in the last several weeks of this illness. She has never had a colonoscopy, but states she has had a mammogram. Because of the nodular appearance of her left lower lobe opacity, CT was obtained which showed a well-circumscribed nodule 2 cm in diameter in the left lower lobe. In addition radiographs of the low back showed what appeared to be sclerotic lesions of the pelvis.   HPI/S ubjective: No complaints today.   Assessment/Plan: Sepsisdue to  Enterococcus UTI/ Pyelo and bacteremia   -Most likely cause of sepsis, continue Rocephin to which it is sensitive- c  1.8cm superior segment LLL Lung massand liver mass -metastasis of unknown primary vs hamartoma vs primary lung CA - Radiology recommends PET/CT as outpt rather than a bx at this time - CT scan head negative for metastasis  Tobacco Abuse   -Patient continues to smoke. -Counseled patient on the absolute need to discontinue smoking completely and permanently  Anemia of chronic disease  -Worrisome for the possibility of an occult malignancy - follow hemoglobin trend  Hyponatremia -Quickly corrected with volume depletion - appears to have been due to simple  hypovolemia  Hyperglycemia -Hemoglobin A1c 6.9- will start diabetes teaching  Hypomagnesemia - replaced  Hypokalemia - improved with replacement    Code Status: FULL Family Communication: husband at bedside Disposition Plan: PET/CT?    Consultants: IR  Procedure/Significant Events: 1/22 CT chest WO contrast;- circumscribed nodule superior segment left lower lobe measuring 1.8 cm diameter. Suspicious for malignancy, 1/23 CT abdomen pelvis with contrast;Findings consistent with pyelonephritis which appears Lt>>>Rt.-Negative for colitis. -S/P cholecystectomy. -Diverticulosis without diverticulitis.   Antibiotics: Azithro 1/23 1 dose Ceftriaxone 1/23 >>> Zosyn 1/22 1 dose Vanc 1/22 2 doses  DVT prophylaxis: Lovenox   Devices NA   LINES / TUBES:  NA    Continuous Infusions:    Objective: VITAL SIGNS: Temp: 99.9 F (37.7 C) (01/27 0552) Temp Source: Oral (01/27 0552) BP: 104/51 mmHg (01/27 0552) Pulse Rate: 93 (01/27 0552) SPO2; FIO2:   Intake/Output Summary (Last 24 hours) at 06/20/15 1606 Last data filed at 06/20/15 1429  Gross per 24 hour  Intake   8000 ml  Output   1300 ml  Net   6700 ml     Exam: General:A/O 4, NAD,, No acute respiratory distress Eyes: Negative headache, negative scleral hemorrhage ENT: Negative Runny nose, negative gingival bleeding, Neck:  Negative scars, masses, torticollis, lymphadenopathy, JVD Lungs: Clear to auscultation bilaterally without wheezes or crackles Cardiovascular: Regular rate and rhythm without murmur gallop or rub normal S1 and S2 Abdomen:negative abdominal pain, nondistended, positive soft, bowel sounds, no rebound, no ascites, no appreciable mass, positive right CVA tenderness Extremities:  No significant cyanosis, clubbing, or edema bilateral lower extremities Psychiatric:  Negative depression, negative anxiety, negative fatigue, negative mania  Neurologic:  Cranial nerves II through XII intact,  tongue/uvula midline, all extremities muscle strength 5/5, sensation intact throughout, negative dysarthria, negative expressive aphasia, negative receptive aphasia.   Data Reviewed: Basic Metabolic Panel:  Recent Labs Lab 06/15/15 1520 06/15/15 2248 06/16/15 0439 06/17/15 0527 06/17/15 1315 06/19/15 0426  NA 127*  --  135 132*  --  134*  K 3.7  --  3.5 3.4* 3.5 3.5  CL 93*  --  104 101  --  100*  CO2 22  --  23 22  --  22  GLUCOSE 199*  --  213* 120*  --  150*  BUN 12  --  13 <5*  --  <5*  CREATININE 1.16* 1.04* 0.99 0.85  --  0.97  CALCIUM 8.7*  --  7.9* 8.1*  --  8.1*  MG  --   --   --   --  1.4* 1.9   Liver Function Tests:  Recent Labs Lab 06/15/15 1520 06/17/15 0527  AST 30 20  ALT 30 27  ALKPHOS 115 79  BILITOT 0.6 0.3  PROT 6.9 5.4*  ALBUMIN 2.4* 1.9*   No results for input(s): LIPASE, AMYLASE in the last 168 hours. No results for input(s): AMMONIA in the last 168 hours. CBC:  Recent Labs Lab 06/15/15 1520  06/16/15 0439 06/17/15 0527 06/19/15 0426 06/19/15 0912 06/20/15 1315  WBC 22.8*  < > 16.4* 16.5* 23.1* 22.0* 22.2*  NEUTROABS 20.8*  --   --   --   --   --   --   HGB 11.2*  < > 8.7* 8.5* 8.7* 8.5* 8.2*  HCT 32.6*  < > 25.4* 25.3* 25.2* 24.7* 24.6*  MCV 92.4  < > 93.0 91.7 92.0 92.2 92.8  PLT 230  < > 193 200 238 242 301  < > = values in this interval not displayed. Cardiac Enzymes: No results for input(s): CKTOTAL, CKMB, CKMBINDEX, TROPONINI in the last 168 hours. BNP (last 3 results) No results for input(s): BNP in the last 8760 hours.  ProBNP (last 3 results) No results for input(s): PROBNP in the last 8760 hours.  CBG: No results for input(s): GLUCAP in the last 168 hours.  Recent Results (from the past 240 hour(s))  Culture, blood (routine x 2)     Status: None   Collection Time: 06/15/15  1:00 PM  Result Value Ref Range Status   Specimen Description BLOOD RIGHT ARM  Final   Special Requests BOTTLES DRAWN AEROBIC AND ANAEROBIC 5  CC  Final   Culture  Setup Time   Final    GRAM NEGATIVE RODS IN BOTH AEROBIC AND ANAEROBIC BOTTLES CRITICAL RESULT CALLED TO, READ BACK BY AND VERIFIED WITH: L. HITT RN [id number] [place]     Culture ENTEROBACTER CLOACAE  Final   Report Status 06/20/2015 FINAL  Final   Organism ID, Bacteria ENTEROBACTER CLOACAE  Final      Susceptibility   Enterobacter cloacae - MIC*    CEFAZOLIN 8 RESISTANT Resistant     CEFEPIME <=1 SENSITIVE Sensitive     CEFTAZIDIME <=1 SENSITIVE Sensitive     CEFTRIAXONE <=1 SENSITIVE Sensitive     CIPROFLOXACIN <=0.25 SENSITIVE Sensitive     GENTAMICIN <=1 SENSITIVE Sensitive     IMIPENEM <=0.25 SENSITIVE Sensitive     TRIMETH/SULFA <=20 SENSITIVE Sensitive  PIP/TAZO <=4 SENSITIVE Sensitive     * ENTEROBACTER CLOACAE  Culture, blood (routine x 2)     Status: None   Collection Time: 06/15/15  3:09 PM  Result Value Ref Range Status   Specimen Description BLOOD RIGHT HAND  Final   Special Requests BOTTLES DRAWN AEROBIC ONLY 10CC  Final   Culture  Setup Time   Final    GRAM NEGATIVE RODS AEROBIC BOTTLE ONLY CRITICAL RESULT CALLED TO, READ BACK BY AND VERIFIED WITH: L. HITT RN [phone] [id number] GREEN R CONFIRMED BY M. CAMPBELL     Culture   Final    ENTEROBACTER CLOACAE SUSCEPTIBILITIES PERFORMED ON PREVIOUS CULTURE WITHIN THE LAST 5 DAYS.    Report Status 06/20/2015 FINAL  Final  Urine culture     Status: None   Collection Time: 06/15/15  4:10 PM  Result Value Ref Range Status   Specimen Description URINE, RANDOM  Final   Special Requests NONE  Final   Culture >=100,000 COLONIES/mL ENTEROBACTER CLOACAE  Final   Report Status 06/18/2015 FINAL  Final   Organism ID, Bacteria ENTEROBACTER CLOACAE  Final      Susceptibility   Enterobacter cloacae - MIC*    CEFAZOLIN <=4 RESISTANT Resistant     CEFTRIAXONE <=1 SENSITIVE Sensitive     CIPROFLOXACIN <=0.25 SENSITIVE Sensitive     GENTAMICIN <=1 SENSITIVE Sensitive     IMIPENEM  <=0.25 SENSITIVE Sensitive     NITROFURANTOIN <=16 SENSITIVE Sensitive     TRIMETH/SULFA <=20 SENSITIVE Sensitive     PIP/TAZO <=4 SENSITIVE Sensitive     * >=100,000 COLONIES/mL ENTEROBACTER CLOACAE  MRSA PCR Screening     Status: None   Collection Time: 06/15/15  9:59 PM  Result Value Ref Range Status   MRSA by PCR NEGATIVE NEGATIVE Final    Comment:        The GeneXpert MRSA Assay (FDA approved for NASAL specimens only), is one component of a comprehensive MRSA colonization surveillance program. It is not intended to diagnose MRSA infection nor to guide or monitor treatment for MRSA infections.   C difficile quick scan w PCR reflex     Status: None   Collection Time: 06/16/15 10:54 AM  Result Value Ref Range Status   C Diff antigen NEGATIVE NEGATIVE Final   C Diff toxin NEGATIVE NEGATIVE Final   C Diff interpretation Negative for toxigenic C. difficile  Final  Culture, blood (Routine X 2) w Reflex to ID Panel     Status: None (Preliminary result)   Collection Time: 06/18/15  2:00 PM  Result Value Ref Range Status   Specimen Description BLOOD RIGHT HAND  Final   Special Requests BOTTLES DRAWN AEROBIC AND ANAEROBIC 10CC  Final   Culture NO GROWTH 2 DAYS  Final   Report Status PENDING  Incomplete  Culture, blood (Routine X 2) w Reflex to ID Panel     Status: None (Preliminary result)   Collection Time: 06/18/15  2:15 PM  Result Value Ref Range Status   Specimen Description BLOOD LEFT ANTECUBITAL  Final   Special Requests BOTTLES DRAWN AEROBIC AND ANAEROBIC 10CC  Final   Culture NO GROWTH 2 DAYS  Final   Report Status PENDING  Incomplete     Studies:  Recent x-ray studies have been reviewed in detail by the Attending Physician  Scheduled Meds:  Scheduled Meds: . cefTRIAXone (ROCEPHIN)  IV  2 g Intravenous Q24H  . enoxaparin (LOVENOX) injection  40 mg Subcutaneous Daily  . sodium  chloride  3 mL Intravenous Q12H  . temazepam  7.5 mg Oral QHS    Time spent on care  of this patient: 20 mins   [facility] , MD  [facility] Office  [phone] Pager - [phone]  On-Call/Text Page:      [email or web address]      password [email or web address]  If 7PM-7AM, please contact night-coverage [email or web address] Password [email or web address] 06/20/2015, 4:06 PM   LOS: 5 days

## 2015-06-21 ENCOUNTER — Inpatient Hospital Stay (HOSPITAL_COMMUNITY): Payer: 59

## 2015-06-21 ENCOUNTER — Encounter (HOSPITAL_COMMUNITY): Payer: Self-pay | Admitting: Radiology

## 2015-06-21 DIAGNOSIS — R509 Fever, unspecified: Secondary | ICD-10-CM

## 2015-06-21 DIAGNOSIS — D689 Coagulation defect, unspecified: Secondary | ICD-10-CM

## 2015-06-21 LAB — CBC WITH DIFFERENTIAL/PLATELET
Basophils Absolute: 0 10*3/uL (ref 0.0–0.1)
Basophils Relative: 0 %
EOS PCT: 0 %
Eosinophils Absolute: 0 10*3/uL (ref 0.0–0.7)
HEMATOCRIT: 25.1 % — AB (ref 36.0–46.0)
Hemoglobin: 8.4 g/dL — ABNORMAL LOW (ref 12.0–15.0)
LYMPHS ABS: 0.7 10*3/uL (ref 0.7–4.0)
Lymphocytes Relative: 3 %
MCH: 30.9 pg (ref 26.0–34.0)
MCHC: 33.5 g/dL (ref 30.0–36.0)
MCV: 92.3 fL (ref 78.0–100.0)
MONO ABS: 0.3 10*3/uL (ref 0.1–1.0)
Monocytes Relative: 1 %
NEUTROS ABS: 23.9 10*3/uL — AB (ref 1.7–7.7)
Neutrophils Relative %: 96 %
Platelets: 301 10*3/uL (ref 150–400)
RBC: 2.72 MIL/uL — AB (ref 3.87–5.11)
RDW: 13.5 % (ref 11.5–15.5)
WBC: 24.9 10*3/uL — AB (ref 4.0–10.5)

## 2015-06-21 MED ORDER — HEPARIN (PORCINE) IN NACL 100-0.45 UNIT/ML-% IJ SOLN
1700.0000 [IU]/h | INTRAMUSCULAR | Status: DC
  Administered 2015-06-21: 1250 [IU]/h via INTRAVENOUS
  Administered 2015-06-22: 1500 [IU]/h via INTRAVENOUS
  Filled 2015-06-21 (×3): qty 250

## 2015-06-21 MED ORDER — IOHEXOL 300 MG/ML  SOLN: 50.0000 mL | INTRAMUSCULAR | Status: DC

## 2015-06-21 MED ORDER — IOHEXOL 350 MG/ML SOLN
100.0000 mL | Freq: Once | INTRAVENOUS | Status: AC | PRN
Start: 2015-06-21 — End: 2015-06-21
  Administered 2015-06-21: 100 mL via INTRAVENOUS

## 2015-06-21 MED ORDER — IOHEXOL 300 MG/ML  SOLN
100.0000 mL | Freq: Once | INTRAMUSCULAR | Status: AC | PRN
  Administered 2015-06-21: 100 mL via INTRAVENOUS

## 2015-06-21 NOTE — Progress Notes (Signed)
Patient has a temp this AM of 101.1, HR 142, BP 127/98, pulse ox 94 on room air. Tylenol given, NP on call notified. No new orders given at this time. Will continue to monitor. Jimmie Molly, RN

## 2015-06-21 NOTE — Progress Notes (Signed)
[place] TEAM 1 - Stepdown/ICU TEAM Progress Note  Becky Patterson NKN:[id number] DOB: [birth year] DOA: 06/15/2015 PCP: No primary care provider on file.  Admit HPI / Brief Narrative: 60 y.o. WF PMHx Tobacco Abuse   Presented with fever and cough. 2 or 3 weeks ago she started to develop malaise and diarrhea, with occasional emesis and decreased appetite. The day of her admission she developed fever and shakes, so her family made her come to the ER.  In the ED, the patient had a temperature to 102.64F, HR 127 bpm, tachypnea, and low blood pressure. Lactic acid level was initially 3. She had leukocytosis and a chest x-ray that showed a left lower lobe opacity.   Of note, the patient has had increased low back pain for a week, and is felt to have possibly lost 20 pounds in the last several weeks of this illness. She has never had a colonoscopy, but states she has had a mammogram. Because of the nodular appearance of her left lower lobe opacity, CT was obtained which showed a well-circumscribed nodule 2 cm in diameter in the left lower lobe. In addition radiographs of the low back showed what appeared to be sclerotic lesions of the pelvis.   HPI/S ubjective: Nauseated this AM   Assessment/Plan: Sepsisdue to  Enterococcus UTI/ Pyelo and bacteremia   - fever this AM at 101 degrees- will order pan CT to find etiology- ID following -continue Rocephin to which enterococcus is sensitive   1.8cm superior segment LLL Lung massand liver mass -metastasis of unknown primary vs hamartoma vs primary lung CA - Radiology recommends PET/CT as outpt rather than a bx at this time - CT scan head negative for metastasis  Tobacco Abuse   -Patient continues to smoke. -Counseled patient on the absolute need to discontinue smoking completely and permanently  Anemia of chronic disease  -Worrisome for the possibility of an occult malignancy - follow hemoglobin trend  Hyponatremia -Quickly corrected with  volume depletion - appears to have been due to hypovolemia  Hyperglycemia -Hemoglobin A1c 6.9-  diabetes teaching  Hypomagnesemia - replaced  Hypokalemia - improved with replacement    Code Status: FULL Family Communication: husband at bedside Disposition Plan: PET/CT?    Consultants: IR  Procedure/Significant Events: 1/22 CT chest WO contrast;- circumscribed nodule superior segment left lower lobe measuring 1.8 cm diameter. Suspicious for malignancy, 1/23 CT abdomen pelvis with contrast;Findings consistent with pyelonephritis which appears Lt>>>Rt.-Negative for colitis. -S/P cholecystectomy. -Diverticulosis without diverticulitis.   Antibiotics: Azithro 1/23 1 dose Ceftriaxone 1/23 >>> Zosyn 1/22 1 dose Vanc 1/22 2 doses  DVT prophylaxis: Lovenox   Devices NA   LINES / TUBES:  NA    Continuous Infusions:    Objective: VITAL SIGNS: Temp: 101.1 F (38.4 C) (01/28 0615) Temp Source: Oral (01/28 0615) BP: 127/98 mmHg (01/28 0615) Pulse Rate: 142 (01/28 0615) SPO2; FIO2:   Intake/Output Summary (Last 24 hours) at 06/21/15 1156 Last data filed at 06/21/15 3790  Gross per 24 hour  Intake   1142 ml  Output    400 ml  Net    742 ml     Exam: General:A/O 4, NAD,, No acute respiratory distress Eyes: Negative headache, negative scleral hemorrhage ENT: Negative Runny nose, negative gingival bleeding, Neck:  Negative scars, masses, torticollis, lymphadenopathy, JVD Lungs: Clear to auscultation bilaterally without wheezes or crackles Cardiovascular: Regular rate and rhythm without murmur gallop or rub normal S1 and S2 Abdomen:negative abdominal pain, nondistended, positive soft, bowel sounds, no  rebound, no ascites, no appreciable mass, positive right CVA tenderness Extremities: No significant cyanosis, clubbing, or edema bilateral lower extremities Psychiatric:  Negative depression, negative anxiety, negative fatigue, negative mania  Neurologic:   Cranial nerves II through XII intact, tongue/uvula midline, all extremities muscle strength 5/5, sensation intact throughout, negative dysarthria, negative expressive aphasia, negative receptive aphasia.   Data Reviewed: Basic Metabolic Panel:  Recent Labs Lab 06/15/15 1520 06/15/15 2248 06/16/15 0439 06/17/15 0527 06/17/15 1315 06/19/15 0426  NA 127*  --  135 132*  --  134*  K 3.7  --  3.5 3.4* 3.5 3.5  CL 93*  --  104 101  --  100*  CO2 22  --  23 22  --  22  GLUCOSE 199*  --  213* 120*  --  150*  BUN 12  --  13 <5*  --  <5*  CREATININE 1.16* 1.04* 0.99 0.85  --  0.97  CALCIUM 8.7*  --  7.9* 8.1*  --  8.1*  MG  --   --   --   --  1.4* 1.9   Liver Function Tests:  Recent Labs Lab 06/15/15 1520 06/17/15 0527  AST 30 20  ALT 30 27  ALKPHOS 115 79  BILITOT 0.6 0.3  PROT 6.9 5.4*  ALBUMIN 2.4* 1.9*   No results for input(s): LIPASE, AMYLASE in the last 168 hours. No results for input(s): AMMONIA in the last 168 hours. CBC:  Recent Labs Lab 06/15/15 1520  06/19/15 0426 06/19/15 0912 06/20/15 1315 06/20/15 1940 06/21/15 0842  WBC 22.8*  < > 23.1* 22.0* 22.2* 18.0* 24.9*  NEUTROABS 20.8*  --   --   --   --  14.8* 23.9*  HGB 11.2*  < > 8.7* 8.5* 8.2* 8.1* 8.4*  HCT 32.6*  < > 25.2* 24.7* 24.6* 24.5* 25.1*  MCV 92.4  < > 92.0 92.2 92.8 92.5 92.3  PLT 230  < > 238 242 301 335 301  < > = values in this interval not displayed. Cardiac Enzymes: No results for input(s): CKTOTAL, CKMB, CKMBINDEX, TROPONINI in the last 168 hours. BNP (last 3 results) No results for input(s): BNP in the last 8760 hours.  ProBNP (last 3 results) No results for input(s): PROBNP in the last 8760 hours.  CBG: No results for input(s): GLUCAP in the last 168 hours.  Recent Results (from the past 240 hour(s))  Culture, blood (routine x 2)     Status: None   Collection Time: 06/15/15  1:00 PM  Result Value Ref Range Status   Specimen Description BLOOD RIGHT ARM  Final   Special  Requests BOTTLES DRAWN AEROBIC AND ANAEROBIC 5 CC  Final   Culture  Setup Time   Final    GRAM NEGATIVE RODS IN BOTH AEROBIC AND ANAEROBIC BOTTLES CRITICAL RESULT CALLED TO, READ BACK BY AND VERIFIED WITH: L. HITT RN [id number] [place]     Culture ENTEROBACTER CLOACAE  Final   Report Status 06/20/2015 FINAL  Final   Organism ID, Bacteria ENTEROBACTER CLOACAE  Final      Susceptibility   Enterobacter cloacae - MIC*    CEFAZOLIN 8 RESISTANT Resistant     CEFEPIME <=1 SENSITIVE Sensitive     CEFTAZIDIME <=1 SENSITIVE Sensitive     CEFTRIAXONE <=1 SENSITIVE Sensitive     CIPROFLOXACIN <=0.25 SENSITIVE Sensitive     GENTAMICIN <=1 SENSITIVE Sensitive     IMIPENEM <=0.25 SENSITIVE Sensitive  TRIMETH/SULFA <=20 SENSITIVE Sensitive     PIP/TAZO <=4 SENSITIVE Sensitive     * ENTEROBACTER CLOACAE  Culture, blood (routine x 2)     Status: None   Collection Time: 06/15/15  3:09 PM  Result Value Ref Range Status   Specimen Description BLOOD RIGHT HAND  Final   Special Requests BOTTLES DRAWN AEROBIC ONLY 10CC  Final   Culture  Setup Time   Final    GRAM NEGATIVE RODS AEROBIC BOTTLE ONLY CRITICAL RESULT CALLED TO, READ BACK BY AND VERIFIED WITH: L. HITT RN [phone] 0528 GREEN R CONFIRMED BY M. CAMPBELL     Culture   Final    ENTEROBACTER CLOACAE SUSCEPTIBILITIES PERFORMED ON PREVIOUS CULTURE WITHIN THE LAST 5 DAYS.    Report Status 06/20/2015 FINAL  Final  Urine culture     Status: None   Collection Time: 06/15/15  4:10 PM  Result Value Ref Range Status   Specimen Description URINE, RANDOM  Final   Special Requests NONE  Final   Culture >=100,000 COLONIES/mL ENTEROBACTER CLOACAE  Final   Report Status 06/18/2015 FINAL  Final   Organism ID, Bacteria ENTEROBACTER CLOACAE  Final      Susceptibility   Enterobacter cloacae - MIC*    CEFAZOLIN <=4 RESISTANT Resistant     CEFTRIAXONE <=1 SENSITIVE Sensitive     CIPROFLOXACIN <=0.25 SENSITIVE Sensitive      GENTAMICIN <=1 SENSITIVE Sensitive     IMIPENEM <=0.25 SENSITIVE Sensitive     NITROFURANTOIN <=16 SENSITIVE Sensitive     TRIMETH/SULFA <=20 SENSITIVE Sensitive     PIP/TAZO <=4 SENSITIVE Sensitive     * >=100,000 COLONIES/mL ENTEROBACTER CLOACAE  MRSA PCR Screening     Status: None   Collection Time: 06/15/15  9:59 PM  Result Value Ref Range Status   MRSA by PCR NEGATIVE NEGATIVE Final    Comment:        The GeneXpert MRSA Assay (FDA approved for NASAL specimens only), is one component of a comprehensive MRSA colonization surveillance program. It is not intended to diagnose MRSA infection nor to guide or monitor treatment for MRSA infections.   C difficile quick scan w PCR reflex     Status: None   Collection Time: 06/16/15 10:54 AM  Result Value Ref Range Status   C Diff antigen NEGATIVE NEGATIVE Final   C Diff toxin NEGATIVE NEGATIVE Final   C Diff interpretation Negative for toxigenic C. difficile  Final  Culture, blood (Routine X 2) w Reflex to ID Panel     Status: None (Preliminary result)   Collection Time: 06/18/15  2:00 PM  Result Value Ref Range Status   Specimen Description BLOOD RIGHT HAND  Final   Special Requests BOTTLES DRAWN AEROBIC AND ANAEROBIC 10CC  Final   Culture NO GROWTH 3 DAYS  Final   Report Status PENDING  Incomplete  Culture, blood (Routine X 2) w Reflex to ID Panel     Status: None (Preliminary result)   Collection Time: 06/18/15  2:15 PM  Result Value Ref Range Status   Specimen Description BLOOD LEFT ANTECUBITAL  Final   Special Requests BOTTLES DRAWN AEROBIC AND ANAEROBIC 10CC  Final   Culture NO GROWTH 3 DAYS  Final   Report Status PENDING  Incomplete     Studies:  Recent x-ray studies have been reviewed in detail by the Attending Physician  Scheduled Meds:  Scheduled Meds: . cefTRIAXone (ROCEPHIN)  IV  2 g Intravenous Q24H  . enoxaparin (LOVENOX) injection  40 mg Subcutaneous Daily  . iohexol  50 mL Oral Q1 Hr x 2  . sodium  chloride  3 mL Intravenous Q12H  . temazepam  7.5 mg Oral QHS    Time spent on care of this patient: 20 mins   [facility] , MD  [facility] Office  [phone] Pager - [phone]  On-Call/Text Page:      [email or web address]      password [email or web address]  If 7PM-7AM, please contact night-coverage [email or web address] Password [email or web address] 06/21/2015, 11:56 AM   LOS: 6 days

## 2015-06-21 NOTE — Progress Notes (Signed)
[facility]   Reason for visit: Follow up on bacteremia  Interval History: fever to 101 this am, does not feel any different otherwise.  Diarrhea the same. CT of abd ordered for ? Abscess.    Physical Exam: Constitutional:  Filed Vitals:   06/21/15 1328 06/21/15 1333  BP: 88/51 81/49  Pulse: 90 92  Temp: 99.6 F (37.6 C)   Resp:     patient appears in NAD Respiratory: Normal respiratory effort; CTA B Cardiovascular: RRR  Review of Systems: Constitutional: negative for fatigue Gastrointestinal: negative for nausea and diarrhea  Lab Results  Component Value Date   WBC 24.9* 06/21/2015   HGB 8.4* 06/21/2015   HCT 25.1* 06/21/2015   MCV 92.3 06/21/2015   PLT 301 06/21/2015    Lab Results  Component Value Date   CREATININE 0.97 06/19/2015   BUN <5* 06/19/2015   NA 134* 06/19/2015   K 3.5 06/19/2015   CL 100* 06/19/2015   CO2 22 06/19/2015    Lab Results  Component Value Date   ALT 27 06/17/2015   AST 20 06/17/2015   ALKPHOS 79 06/17/2015     Microbiology: Recent Results (from the past 240 hour(s))  Culture, blood (routine x 2)     Status: None   Collection Time: 06/15/15  1:00 PM  Result Value Ref Range Status   Specimen Description BLOOD RIGHT ARM  Final   Special Requests BOTTLES DRAWN AEROBIC AND ANAEROBIC 5 CC  Final   Culture  Setup Time   Final    GRAM NEGATIVE RODS IN BOTH AEROBIC AND ANAEROBIC BOTTLES CRITICAL RESULT CALLED TO, READ BACK BY AND VERIFIED WITH: L. HITT RN [id number] [phone] GREEN R CONFIRMED BY [place]     Culture ENTEROBACTER CLOACAE  Final   Report Status 06/20/2015 FINAL  Final   Organism ID, Bacteria ENTEROBACTER CLOACAE  Final      Susceptibility   Enterobacter cloacae - MIC*    CEFAZOLIN 8 RESISTANT Resistant     CEFEPIME <=1 SENSITIVE Sensitive     CEFTAZIDIME <=1 SENSITIVE Sensitive     CEFTRIAXONE <=1 SENSITIVE Sensitive     CIPROFLOXACIN <=0.25 SENSITIVE Sensitive     GENTAMICIN <=1  SENSITIVE Sensitive     IMIPENEM <=0.25 SENSITIVE Sensitive     TRIMETH/SULFA <=20 SENSITIVE Sensitive     PIP/TAZO <=4 SENSITIVE Sensitive     * ENTEROBACTER CLOACAE  Culture, blood (routine x 2)     Status: None   Collection Time: 06/15/15  3:09 PM  Result Value Ref Range Status   Specimen Description BLOOD RIGHT HAND  Final   Special Requests BOTTLES DRAWN AEROBIC ONLY 10CC  Final   Culture  Setup Time   Final    GRAM NEGATIVE RODS AEROBIC BOTTLE ONLY CRITICAL RESULT CALLED TO, READ BACK BY AND VERIFIED WITH: L. HITT RN [phone] [id number] GREEN R CONFIRMED BY M. CAMPBELL     Culture   Final    ENTEROBACTER CLOACAE SUSCEPTIBILITIES PERFORMED ON PREVIOUS CULTURE WITHIN THE LAST 5 DAYS.    Report Status 06/20/2015 FINAL  Final  Urine culture     Status: None   Collection Time: 06/15/15  4:10 PM  Result Value Ref Range Status   Specimen Description URINE, RANDOM  Final   Special Requests NONE  Final   Culture >=100,000 COLONIES/mL ENTEROBACTER CLOACAE  Final   Report Status 06/18/2015 FINAL  Final   Organism ID, Bacteria ENTEROBACTER CLOACAE  Final  Susceptibility   Enterobacter cloacae - MIC*    CEFAZOLIN <=4 RESISTANT Resistant     CEFTRIAXONE <=1 SENSITIVE Sensitive     CIPROFLOXACIN <=0.25 SENSITIVE Sensitive     GENTAMICIN <=1 SENSITIVE Sensitive     IMIPENEM <=0.25 SENSITIVE Sensitive     NITROFURANTOIN <=16 SENSITIVE Sensitive     TRIMETH/SULFA <=20 SENSITIVE Sensitive     PIP/TAZO <=4 SENSITIVE Sensitive     * >=100,000 COLONIES/mL ENTEROBACTER CLOACAE  MRSA PCR Screening     Status: None   Collection Time: 06/15/15  9:59 PM  Result Value Ref Range Status   MRSA by PCR NEGATIVE NEGATIVE Final    Comment:        The GeneXpert MRSA Assay (FDA approved for NASAL specimens only), is one component of a comprehensive MRSA colonization surveillance program. It is not intended to diagnose MRSA infection nor to guide or monitor treatment for MRSA infections.   C  difficile quick scan w PCR reflex     Status: None   Collection Time: 06/16/15 10:54 AM  Result Value Ref Range Status   C Diff antigen NEGATIVE NEGATIVE Final   C Diff toxin NEGATIVE NEGATIVE Final   C Diff interpretation Negative for toxigenic C. difficile  Final  Culture, blood (Routine X 2) w Reflex to ID Panel     Status: None (Preliminary result)   Collection Time: 06/18/15  2:00 PM  Result Value Ref Range Status   Specimen Description BLOOD RIGHT HAND  Final   Special Requests BOTTLES DRAWN AEROBIC AND ANAEROBIC 10CC  Final   Culture NO GROWTH 3 DAYS  Final   Report Status PENDING  Incomplete  Culture, blood (Routine X 2) w Reflex to ID Panel     Status: None (Preliminary result)   Collection Time: 06/18/15  2:15 PM  Result Value Ref Range Status   Specimen Description BLOOD LEFT ANTECUBITAL  Final   Special Requests BOTTLES DRAWN AEROBIC AND ANAEROBIC 10CC  Final   Culture NO GROWTH 3 DAYS  Final   Report Status PENDING  Incomplete    Impression/Plan:  1. Pyelonephritis - repeat blood cultures are negative.  Continue with current treatment 2. Fever - likely related to #1.  Dr. Wynelle Cleveland doing repeat CT 3. Lung mass. Nodule noted.  Will need further follow up with her primary physician.

## 2015-06-21 NOTE — Consult Note (Signed)
ANTICOAGULATION CONSULT NOTE - Initial Consult  Pharmacy Consult for Heparin Indication: splenic infarcts  Allergies  Allergen Reactions  . Codeine Nausea And Vomiting    Patient Measurements: Height: '5\' 2"'$  (157.5 cm) Weight: 202 lb 9.6 oz (91.9 kg) IBW/kg (Calculated) : 50.1 Heparin Dosing Weight: ~71kg  Vital Signs: Temp: 99.3 F (37.4 C) (01/28 1744) Temp Source: Oral (01/28 1744) BP: 81/49 mmHg (01/28 1333) Pulse Rate: 92 (01/28 1333)  Labs:  Recent Labs  06/19/15 0426  06/20/15 1315 06/20/15 1940 06/21/15 0842  HGB 8.7*  < > 8.2* 8.1* 8.4*  HCT 25.2*  < > 24.6* 24.5* 25.1*  PLT 238  < > 301 335 301  CREATININE 0.97  --   --   --   --   < > = values in this interval not displayed.  Estimated Creatinine Clearance: 64.2 mL/min (by C-G formula based on Cr of 0.97).   Medical History: History reviewed. No pertinent past medical history.  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 Eventual transition will be to [place]. He received a dose of prophylactic lovenox '40mg'$  at 0907 this morning so will not bolus.  Goal of Therapy:  Heparin level 0.3-0.7 units/ml Monitor platelets by anticoagulation protocol: Yes   Plan:  1) Begin heparin at 1250 units/hr 2) Check 6 hour heparin level 3) Daily heparin level and CBC  Deboraha Sprang 06/21/2015,7:58 PM

## 2015-06-21 NOTE — Progress Notes (Signed)
WBC is 24.9, Dr. Wynelle Cleveland texted.

## 2015-06-21 NOTE — Consult Note (Signed)
[facility]  [phone] [phone]     ID: RAELA BOHL DOB: 02/08/1956  MR#: [id number]  QMV#:[id number]  No care team member to display PCP: No primary care provider on file. GYN: SU:  OTHER MD:   CHIEF COMPLAINT: splenic infarcts in setting of urosepsis  CURRENT TREATMENT: antibiotics; anticoagulants   HISTORY OF PRESENT ILLNESS:/ Becky Patterson had a flu-like illness about 1 month PTA qwhich was treated symptomatically through urgent care. A few days prior to admission she got off a chair "the wrong way" and "pulkled" her back-- she is s/p spinal fusion. Within the next several days she developed more back pain, malaise, cough, nausea, diarrhea and high fever. She presented to the ED with these complaints and was found to be moderately hypotensive with a.WBC count of 22.8K. She was pancultured and started on vancomycin and pip/tazo. CXR showed a L lung nodule and CT of the chest confirmed an unusually well-circumscribed nodule in the superior segment of the LLL. Spleen was unremarkable.  Blood cultures and urine culture from 06/15/2015 grew enterobacter cloacae resistant to cefazolin but otherwise pan-sensitive. Repeat blood cultures 06/18/2015 so far negative. Discharge was contemplated but the patient again developed fever and low blood pressure 06/21/2015 and felt "no better than before." CT of the C/A/P 06/21/2015 now shows two large areas in the spleen c/w infarcts. We were asked to consult re further evaluation and management  INTERVAL HISTORY: I evaluated Becky Patterson in her hospital room 06/21/2015; no family present  REVIEW OF SYSTEMS: She c/o of LUQ abdominal pain, not severe but persistent. Not eating much. Diarrhea persists. "I don't feel well." She is staying in bed mostly. Denies, h/a, N/V at present. Not aware of any rash. No productive cough,pleurisy, or worsening SOB.  PAST MEDICAL HISTORY: History reviewed. No pertinent past medical  history. Remote Hx of epistaxis. Has had cholecystectomy, spinal surhery and 2 vaginal deliveries w/o clotting complications.  PAST SURGICAL HISTORY: Past Surgical History  Procedure Laterality Date  . Back surgery    Cholecystectomy  FAMILY HISTORY Family History  Problem Relation Age of Onset  . Lung cancer Mother   . Liver cancer Brother   . Heart attack Brother   Patient's father died from falling off a ladder. Patient's mother died with lung cancer. Patient had 4 brothers, 1 sister. One brother died from heart problems, one from unknown causes, once from stab wounds. The patient's sister is fine. There is no history of strokes or DVT in the family to her knowledge.  GYNECOLOGIC HISTORY:  No LMP recorded. Patient is postmenopausal. Menarche age 74-13; first live birth age 67; she is GX P2, Used OCPs less than a year, w/o clotting complications. 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 Her 3d husband Tharon Aquas works as a Administrator. Son Abagail Kitchens has a high level job in a Diplomatic Services operational officer in [place]. Daughter [name] in [place] has a clerical job. The patient has 4 grandchildren. She is not a church attender    ADVANCED DIRECTIVES: not in place   HEALTH MAINTENANCE: Social History  Substance Use Topics  . Smoking status: Current Every Day Smoker  . Smokeless tobacco: None  . Alcohol Use: No    Mammography: none for at least 10 years  Colonoscopy: never  PAP: remote  Bone density:never  Lipid panel:  Allergies  Allergen Reactions  . Codeine Nausea And Vomiting    Current Facility-Administered Medications  Medication Dose  Route Frequency Provider Last Rate Last Dose  . acetaminophen (TYLENOL) tablet 1,000 mg  1,000 mg Oral Q6H PRN Debbe Odea, MD   1,000 mg at 06/21/15 0617   Or  . acetaminophen (TYLENOL) suppository 325 mg  325 mg Rectal Q6H PRN Debbe Odea, MD      . cefTRIAXone (ROCEPHIN) 2 g in dextrose 5 % 50 mL IVPB  2 g  Intravenous Q24H Cecilio Asper Batchelder, RPH   2 g at 06/21/15 3382  . enoxaparin (LOVENOX) injection 40 mg  40 mg Subcutaneous Daily Monia Sabal, PA-C   40 mg at 06/21/15 5053  . ondansetron (ZOFRAN) tablet 4 mg  4 mg Oral Q6H PRN Edwin Dada, MD   4 mg at 06/21/15 0701   Or  . ondansetron (ZOFRAN) injection 4 mg  4 mg Intravenous Q6H PRN Edwin Dada, MD   4 mg at 06/18/15 1849  . oxyCODONE (Oxy IR/ROXICODONE) immediate release tablet 5-10 mg  5-10 mg Oral Q4H PRN Cherene Altes, MD      . sodium chloride 0.9 % injection 3 mL  3 mL Intravenous Q12H Edwin Dada, MD   3 mL at 06/21/15 0909  . temazepam (RESTORIL) capsule 7.5 mg  7.5 mg Oral QHS Debbe Odea, MD   7.5 mg at 06/20/15 2200  . traMADol (ULTRAM) tablet 50-100 mg  50-100 mg Oral Q6H PRN Debbe Odea, MD   100 mg at 06/19/15 1807    OBJECTIVE: middle aged White woman examined in bed Filed Vitals:   06/21/15 1333 06/21/15 1744  BP: 81/49   Pulse: 92   Temp:  99.3 F (37.4 C)  Resp:       Body mass index is 37.05 kg/(m^2).    ECOG FS:2 - Symptomatic, <50% confined to bed  Ocular: Sclerae unicteric, pupils equal, round and reactive to light Ear-nose-throat: Oropharynx clear, slightly dry Lymphatic: No cervical or supraclavicular adenopathy Lungs no rales or rhonchi, auscultated anterolaterally Heart regular rate and rhythm, no murmur appreciated Abd soft, nontender to moderate palpation, positive bowel sounds, no palpable splenomegaly Neuro: non-focal, well-oriented, appropriate affect Breasts: no masses noted, no skin or nipple changes, both axillae benign Skin: no subungual hemorrhages   LAB RESULTS:  CMP     Component Value Date/Time   NA 134* 06/19/2015 0426   K 3.5 06/19/2015 0426   CL 100* 06/19/2015 0426   CO2 22 06/19/2015 0426   GLUCOSE 150* 06/19/2015 0426   BUN <5* 06/19/2015 0426   CREATININE 0.97 06/19/2015 0426   CALCIUM 8.1* 06/19/2015 0426   PROT 5.4* 06/17/2015 0527     ALBUMIN 1.9* 06/17/2015 0527   AST 20 06/17/2015 0527   ALT 27 06/17/2015 0527   ALKPHOS 79 06/17/2015 0527   BILITOT 0.3 06/17/2015 0527   GFRNONAA >60 06/19/2015 0426   GFRAA >60 06/19/2015 0426    INo results found for: SPEP, UPEP  Lab Results  Component Value Date   WBC 24.9* 06/21/2015   NEUTROABS 23.9* 06/21/2015   HGB 8.4* 06/21/2015   HCT 25.1* 06/21/2015   MCV 92.3 06/21/2015   PLT 301 06/21/2015    '@LASTCHEMISTRY'$ @  No results found for: LABCA2  No components found for: LABCA125   Recent Labs Lab 06/17/15 0527  INR 1.45    Urinalysis    Component Value Date/Time   COLORURINE AMBER* 06/15/2015 1610   APPEARANCEUR CLOUDY* 06/15/2015 1610   LABSPEC 1.014 06/15/2015 1610   PHURINE 5.5 06/15/2015 1610   GLUCOSEU NEGATIVE  06/15/2015 1610   HGBUR MODERATE* 06/15/2015 1610   BILIRUBINUR NEGATIVE 06/15/2015 1610   [place] 06/15/2015 1610   PROTEINUR 30* 06/15/2015 1610   NITRITE NEGATIVE 06/15/2015 1610   LEUKOCYTESUR MODERATE* 06/15/2015 1610    STUDIES: Dg Chest 2 View  06/15/2015  CLINICAL DATA:  Productive cough for 3 weeks, initial encounter EXAM: CHEST - 2 VIEW COMPARISON:  None. FINDINGS: Cardiac shadow is within normal limits. The lungs are well aerated bilaterally. There is a well-circumscribed 2 cm nodule identified in the mid left lung which appears to project over the spine in the left lower lobe on the lateral projection. Additionally a second somewhat nodular density is noted in the medial costophrenic angle on the left. These changes are suspicious for metastatic disease in CT of the chest is recommended. IMPRESSION: Nodular changes on the left. This is suspicious for metastatic disease. CT of the chest is recommended Electronically Signed   By: Inez Catalina M.D.   On: 06/15/2015 16:01   Dg Lumbar Spine Complete  06/15/2015  CLINICAL DATA:  Lumbar spine pain for 2 weeks, right hip pain, history of lumbar fusion 30 years ago EXAM:  LUMBAR SPINE - COMPLETE 4+ VIEW COMPARISON:  None. FINDINGS: L3 through the sacrum bilateral osseous fusion masses. Focal irregular sclerosis over the right sacroiliac joint primarily on the iliac side. This process measures 5 cm. Left sacroiliac joint normal. Grade 1 borderline grade 2 anterior listhesis of L4 on L5. Severe L4-5 degenerative disc disease. Mild degenerative disc disease at L5-S1 and L3-L4. Moderate degenerative disc disease at L2-3. Minimal degenerative disc disease at L1-2. No evidence of vertebral body fracture. IMPRESSION: Postoperative and degenerative changes as described above. Irregular sclerosis right iliac wing. In metastatic lesions not excluded. There are no prior studies for comparison. If no prior studies can't be obtained to document long-term stability in this finding, the possibility of malignancy would have to be considered and bone scan would be suggested. Electronically Signed   By: Skipper Cliche M.D.   On: 06/15/2015 17:25   Ct Head W Wo Contrast  06/17/2015  CLINICAL DATA:  61 year old female with lung mass discovered on CT 2 days ago. Staging. Subsequent encounter. EXAM: CT HEAD WITHOUT AND WITH CONTRAST TECHNIQUE: Contiguous axial images were obtained from the base of the skull through the vertex without and with intravenous contrast CONTRAST:  20m OMNIPAQUE IOHEXOL 300 MG/ML  SOLN COMPARISON:  None. FINDINGS: Trace paranasal sinus mucosal thickening. Tympanic cavities and mastoids are clear. No suspicious osseous lesion. Visualized orbits and scalp soft tissues are within normal limits. Minimal Calcified atherosclerosis at the skull base. Cerebral volume is within normal limits for age. No midline shift, ventriculomegaly, mass effect, evidence of mass lesion, intracranial hemorrhage or evidence of cortically based acute infarction. Gray-white matter differentiation is within normal limits throughout the brain. No abnormal enhancement identified. Major intracranial  vascular structures are enhancing. IMPRESSION: Normal for age CT appearance of the brain. No metastatic disease identified. Electronically Signed   By: HGenevie AnnM.D.   On: 06/17/2015 12:46   Ct Chest Wo Contrast  06/15/2015  ADDENDUM REPORT: 06/15/2015 21:31 ADDENDUM: CORRECTED IMPRESSION: 1.8 cm diameter circumscribed nodule in the superior segment left lower lung is indeterminate. Malignancy should be excluded. Consider further evaluation with PET-CT or biopsy. Electronically Signed   By: WLucienne CapersM.D.   On: 06/15/2015 21:31  06/15/2015  CLINICAL DATA:  Productive cough for 3 weeks. Decreased appetite. Left lung nodule on chest x-ray 06/15/2015.  EXAM: CT CHEST WITHOUT CONTRAST TECHNIQUE: Multidetector CT imaging of the chest was performed following the standard protocol without IV contrast. COMPARISON:  Chest radiograph 06/15/2015 FINDINGS: There is a circumscribed nodule in the superior segment left lower lobe measuring 1.8 cm diameter. Appearance is suspicious for malignancy, possibly metastasis due to the shape. No significant spiculation. No other nodules identified. Consider PET-CT versus biopsy for further evaluation. Atelectasis in both lung bases. No focal consolidation. No pneumothorax. No pleural effusions. Esophagus is decompressed. No significant lymphadenopathy in the mediastinum or hilar regions on noncontrast imaging. Normal caliber thoracic aorta. Normal heart size. Visualized portions of the upper abdominal organs demonstrate surgical absence of the gallbladder. Possibility of prominent lymph nodes in the left periaortic region although incompletely evaluated. Degenerative changes in the thoracic spine. IMPRESSION: 1.8 cm diameter circumscribed nodule in the superior segment left lobe of liver is indeterminate. Malignancy should be excluded. Consider further evaluation with PET-CT or biopsy. Electronically Signed: By: Lucienne Capers M.D. On: 06/15/2015 19:04   Ct Chest W  Contrast  06/21/2015  CLINICAL DATA:  60 year old admitted on 06/15/2015 with 2 week history of diarrhea, intermittent nausea and vomiting, anorexia, amylase. On the day of admission the patient developed fever and shaking chills. Patient currently has Enterobacter sepsis. Followup bilateral pyelonephritis. EXAM: CT CHEST, ABDOMEN, AND PELVIS WITH CONTRAST TECHNIQUE: Multidetector CT imaging of the chest, abdomen and pelvis was performed following the standard protocol during bolus administration of intravenous contrast. CONTRAST:  125m OMNIPAQUE IOHEXOL 300 MG/ML IV. Oral contrast was also administered. COMPARISON:  CT abdomen and pelvis 5 days ago.  CT chest 6 days ago. FINDINGS: CT CHEST FINDINGS Cardiovascular: Heart size normal. No visible coronary atherosclerosis. No significant pericardial effusion. Mild atherosclerosis involving the aortic arch and the origin of the left subclavian artery. No evidence of aneurysm or dissection. Mediastinum/Lymph Nodes: No pathologic lymphadenopathy. Small amount of oral contrast in the upper and mid esophagus. No evidence of hiatal hernia. Normal-appearing thyroid gland. Lungs/Pleura: Approximate 1.8 cm well-circumscribed low-attenuation nodule in the left lower lobe as noted previously, Hounsfield measurement of -6. No pulmonary parenchymal nodules or masses elsewhere in either lung. Mild atelectasis in the dependent portions of both lower lobes, minimally increased since the examination 6 days ago. Lungs otherwise clear without confluent airspace consolidation or interstitial lung disease. No pleural effusions. No pleural masses. Musculoskeletal: Mild diffuse thoracic spondylosis without acute abnormality. CT ABDOMEN and PELVIS FINDINGS Hepatobiliary: Liver normal in size and appearance. Gallbladder surgically absent. No unexpected biliary ductal dilation. Pancreas: Normal in appearance without evidence of mass, ductal dilation, or inflammation. Spleen: Geographic areas  of low attenuation involving the upper pole and lower pole of the spleen, new since the examination 5 days ago. Adrenals/Urinary Tract: Stable left adrenal nodule measuring approximately 1.3 x 2.1 x 1.5 cm. Normal right adrenal gland. Geographic areas of low attenuation involving both kidneys, right greater than left, associated with generalized edema involving the left kidney and wall thickening involving the left renal pelvis and proximal left ureter, best seen on the delayed images, not significantly changed. No evidence of urinary tract calculi on either side. Normal-appearing urinary bladder. Stomach/Bowel: Stomach normal in appearance for the degree of distention. Normal-appearing small bowel. Sigmoid colon diverticulosis without evidence of acute diverticulitis. Remainder of the colon unremarkable. Lipoma involving the ileocecal valve. Appendix not visualized but no pericecal inflammation. No ascites. Vascular/Lymphatic: Moderate to severe aortoiliac atherosclerosis without aneurysm. Visceral arteries atherosclerotic though patent. Accessory lower pole left renal artery arising from the  distal abdominal aorta. No pathologic lymphadenopathy. Reproductive: Normal-appearing uterus and ovaries without evidence of adnexal mass. Other: Edema in the subcutaneous tissues of the bilateral flank and back, likely dependent due to prolonged supine positioning. Musculoskeletal: Prior surgical of the posterior elements at L4 and L5. Severe disc space narrowing and grade 2 spondylolisthesis of L4 on L5 measuring approximately 1.8 cm. Sclerosis involving the right iliac bone, likely a healed bone donor site. Osseous demineralization. IMPRESSION: 1. Mild dependent atelectasis in the lower lobes, left greater than right. No acute cardiopulmonary disease otherwise. 2. Circumscribed 1.8 cm low-density nodule in the left lower lobe, likely a benign hamartoma. PET-CT may be confirmatory. 3. Acute infarcts involving the upper and  lower pole of the spleen, new since the CT 5 days ago. 4. No significant change in bilateral pyelonephritis and in the inflammation involving the left renal pelvis and proximal left ureter. 5. Sigmoid colon diverticulosis without evidence acute diverticulitis. 6. Likely postsurgical changes at L4 and L5 and involving the right iliac bone. No convincing evidence of osseous metastatic disease. Electronically Signed   By: Evangeline Dakin M.D.   On: 06/21/2015 17:10   Ct Abdomen Pelvis W Contrast  06/21/2015  CLINICAL DATA:  60 year old admitted on 06/15/2015 with 2 week history of diarrhea, intermittent nausea and vomiting, anorexia, amylase. On the day of admission the patient developed fever and shaking chills. Patient currently has Enterobacter sepsis. Followup bilateral pyelonephritis. EXAM: CT CHEST, ABDOMEN, AND PELVIS WITH CONTRAST TECHNIQUE: Multidetector CT imaging of the chest, abdomen and pelvis was performed following the standard protocol during bolus administration of intravenous contrast. CONTRAST:  146m OMNIPAQUE IOHEXOL 300 MG/ML IV. Oral contrast was also administered. COMPARISON:  CT abdomen and pelvis 5 days ago.  CT chest 6 days ago. FINDINGS: CT CHEST FINDINGS Cardiovascular: Heart size normal. No visible coronary atherosclerosis. No significant pericardial effusion. Mild atherosclerosis involving the aortic arch and the origin of the left subclavian artery. No evidence of aneurysm or dissection. Mediastinum/Lymph Nodes: No pathologic lymphadenopathy. Small amount of oral contrast in the upper and mid esophagus. No evidence of hiatal hernia. Normal-appearing thyroid gland. Lungs/Pleura: Approximate 1.8 cm well-circumscribed low-attenuation nodule in the left lower lobe as noted previously, Hounsfield measurement of -6. No pulmonary parenchymal nodules or masses elsewhere in either lung. Mild atelectasis in the dependent portions of both lower lobes, minimally increased since the examination 6  days ago. Lungs otherwise clear without confluent airspace consolidation or interstitial lung disease. No pleural effusions. No pleural masses. Musculoskeletal: Mild diffuse thoracic spondylosis without acute abnormality. CT ABDOMEN and PELVIS FINDINGS Hepatobiliary: Liver normal in size and appearance. Gallbladder surgically absent. No unexpected biliary ductal dilation. Pancreas: Normal in appearance without evidence of mass, ductal dilation, or inflammation. Spleen: Geographic areas of low attenuation involving the upper pole and lower pole of the spleen, new since the examination 5 days ago. Adrenals/Urinary Tract: Stable left adrenal nodule measuring approximately 1.3 x 2.1 x 1.5 cm. Normal right adrenal gland. Geographic areas of low attenuation involving both kidneys, right greater than left, associated with generalized edema involving the left kidney and wall thickening involving the left renal pelvis and proximal left ureter, best seen on the delayed images, not significantly changed. No evidence of urinary tract calculi on either side. Normal-appearing urinary bladder. Stomach/Bowel: Stomach normal in appearance for the degree of distention. Normal-appearing small bowel. Sigmoid colon diverticulosis without evidence of acute diverticulitis. Remainder of the colon unremarkable. Lipoma involving the ileocecal valve. Appendix not visualized but no pericecal inflammation.  No ascites. Vascular/Lymphatic: Moderate to severe aortoiliac atherosclerosis without aneurysm. Visceral arteries atherosclerotic though patent. Accessory lower pole left renal artery arising from the distal abdominal aorta. No pathologic lymphadenopathy. Reproductive: Normal-appearing uterus and ovaries without evidence of adnexal mass. Other: Edema in the subcutaneous tissues of the bilateral flank and back, likely dependent due to prolonged supine positioning. Musculoskeletal: Prior surgical of the posterior elements at L4 and L5. Severe  disc space narrowing and grade 2 spondylolisthesis of L4 on L5 measuring approximately 1.8 cm. Sclerosis involving the right iliac bone, likely a healed bone donor site. Osseous demineralization. IMPRESSION: 1. Mild dependent atelectasis in the lower lobes, left greater than right. No acute cardiopulmonary disease otherwise. 2. Circumscribed 1.8 cm low-density nodule in the left lower lobe, likely a benign hamartoma. PET-CT may be confirmatory. 3. 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 4. No significant change in bilateral pyelonephritis and in the inflammation involving the left renal pelvis and proximal left ureter. 5. Sigmoid colon diverticulosis without evidence acute diverticulitis. 6. Likely postsurgical changes at L4 and L5 and involving the right iliac bone. No convincing evidence of osseous metastatic disease. Electronically Signed   By: Evangeline Dakin M.D.   On: 06/21/2015 17:10   Ct Abdomen Pelvis W Contrast  06/16/2015  CLINICAL DATA:  Malaise and diarrhea beginning 2-3 weeks ago with occasional vomiting. Fever and chills 06/15/2015. Sepsis. Question colitis. Initial encounter. EXAM: CT ABDOMEN AND PELVIS WITH CONTRAST TECHNIQUE: Multidetector CT imaging of the abdomen and pelvis was performed using the standard protocol following bolus administration of intravenous contrast. CONTRAST:  100 mL OMNIPAQUE IOHEXOL 300 MG/ML  SOLN COMPARISON:  None. FINDINGS: There is some dependent atelectasis in the lung bases. No pleural or pericardial effusion. Both kidneys demonstrate areas of decreased parenchymal enhancement. Stranding about both kidneys is worse on the left. Stranding is seen about the left intrarenal collecting system, renal pelvis and ureter. No stone or obstructing lesion is identified. The urinary bladder is incompletely distended. Uterus and adnexa are unremarkable. The patient is status post cholecystectomy. The liver, spleen, adrenal  glands, pancreas and biliary tree appear normal. There is sigmoid diverticulosis without diverticulitis. The colon is otherwise unremarkable. The appendix is not visualized. No pericecal inflammatory process is seen. The stomach and small bowel appear normal. There is no lymphadenopathy or fluid. Aortoiliac atherosclerosis without aneurysm is identified. The no lytic or sclerotic bony lesion is seen. The patient is status post L4-5 laminectomy and fusion. There is 1.3 cm anterolisthesis L4 on L5. IMPRESSION: Findings consistent with pyelonephritis which appears much worse on the left. Negative for colitis. Atherosclerosis. Status post cholecystectomy. Diverticulosis without diverticulitis. Electronically Signed   By: Inge Rise M.D.   On: 06/16/2015 21:04   Dg Hip Unilat With Pelvis 2-3 Views Right  06/15/2015  CLINICAL DATA:  60 year old female with right hip pain after injury while walking 2 weeks ago. Initial encounter. EXAM: DG HIP (WITH OR WITHOUT PELVIS) 2-3V RIGHT COMPARISON:  None. FINDINGS: Femoral heads are normally located. Hip joint spaces are preserved. Pelvis appears intact. There is sclerosis along the medial right iliac bone which probably is postoperative in nature; there are lower lumbar postoperative fusion masses demonstrated bilaterally. The SI joints otherwise appear normal. Proximal left femur appears intact. Proximal right femur intact. IMPRESSION: No acute osseous abnormality identified about the right hip or pelvis. Postoperative changes to the medial right iliac bone and visualized lower lumbar levels. Electronically Signed  By: Genevie Ann M.D.   On: 06/15/2015 17:29    ASSESSMENT: 60 y.o. [place] woman admitted 06/15/2015 with enterobacter cloacae urosepsis, treated initially with piperacillin/tazobactam and vancomycin, currently on ceftriaxone, but with persistent fever, chills, malaise, LUQ abdominal pain, and splenic infarcts noted on CT 06/21/2015 (not on CT  06/15/2015)  PLAN: The patient's history is not suggestive of a primary hypercoagulable state; will nevertheless request an anti-phospholipid Ab screen. More likely the splenic infarcts are secondary to the patient's documented sepsis, the most common source of septic emboli would be cardiac. Suggest  -- TTE and consider transesophageal echo if TTE not definitive  -- IV heparin given the size of the infarcts and the fact that patient may end up needing surgery if complications develop; once stable would discharge on rivaroxaban x 3 months  --would ask ID if adding a second ABX would be of benefit  --the lung lesion certainly does not look like a primary lung CA; could be a metastasis but there is no obvious source despite multiple scans. Suspect a benign lesion (hamartoma). Will send tumor markers  -- Patient's breast exam is negative. She needs to have her health maintenance updated (mammogram, colonoscopy, PAP, etc).   I have discussed the situation with the patient who has a good understanding of what is going on and agrees with the plan.  Chauncey Cruel, MD   06/21/2015 7:16 PM Medical Oncology and Hematology [facility] [address] [place], [place] [address] Tel. [phone]    Fax. [phone]

## 2015-06-21 NOTE — Progress Notes (Signed)
Stool specimen for c diff still pending

## 2015-06-22 ENCOUNTER — Inpatient Hospital Stay (HOSPITAL_COMMUNITY): Payer: 59

## 2015-06-22 DIAGNOSIS — D735 Infarction of spleen: Secondary | ICD-10-CM

## 2015-06-22 DIAGNOSIS — R7881 Bacteremia: Secondary | ICD-10-CM

## 2015-06-22 DIAGNOSIS — N12 Tubulo-interstitial nephritis, not specified as acute or chronic: Secondary | ICD-10-CM | POA: Insufficient documentation

## 2015-06-22 DIAGNOSIS — K529 Noninfective gastroenteritis and colitis, unspecified: Secondary | ICD-10-CM | POA: Insufficient documentation

## 2015-06-22 LAB — GASTROINTESTINAL PANEL BY PCR, STOOL (REPLACES STOOL CULTURE)

## 2015-06-22 LAB — CBC
HCT: 22.7 % — ABNORMAL LOW (ref 36.0–46.0)
Hemoglobin: 7.6 g/dL — ABNORMAL LOW (ref 12.0–15.0)
MCH: 30.9 pg (ref 26.0–34.0)
MCHC: 33.5 g/dL (ref 30.0–36.0)
MCV: 92.3 fL (ref 78.0–100.0)
PLATELETS: 340 10*3/uL (ref 150–400)
RBC: 2.46 MIL/uL — AB (ref 3.87–5.11)
RDW: 13.7 % (ref 11.5–15.5)
WBC: 18.9 10*3/uL — AB (ref 4.0–10.5)

## 2015-06-22 LAB — HEPARIN LEVEL (UNFRACTIONATED)
Heparin Unfractionated: <0.1 IU/mL — ABNORMAL LOW (ref 0.30–0.70)
Heparin Unfractionated: <0.1 IU/mL — ABNORMAL LOW (ref 0.30–0.70)
Heparin Unfractionated: <0.1 IU/mL — ABNORMAL LOW (ref 0.30–0.70)

## 2015-06-22 MED ORDER — ZOLPIDEM TARTRATE 5 MG PO TABS
5.0000 mg | ORAL_TABLET | Freq: Once | ORAL | Status: AC
  Administered 2015-06-22: 5 mg via ORAL
  Filled 2015-06-22: qty 1

## 2015-06-22 MED ORDER — HEPARIN BOLUS VIA INFUSION
2000.0000 [IU] | Freq: Once | INTRAVENOUS | Status: AC
  Administered 2015-06-22: 2000 [IU] via INTRAVENOUS
  Filled 2015-06-22: qty 2000

## 2015-06-22 MED ORDER — HEPARIN (PORCINE) IN NACL 100-0.45 UNIT/ML-% IJ SOLN
2900.0000 [IU]/h | INTRAMUSCULAR | Status: DC
  Administered 2015-06-23: 2000 [IU]/h via INTRAVENOUS
  Administered 2015-06-24: 2900 [IU]/h via INTRAVENOUS
  Administered 2015-06-24: 2600 [IU]/h via INTRAVENOUS
  Filled 2015-06-22 (×6): qty 250

## 2015-06-22 NOTE — Progress Notes (Signed)
[name] TEAM 1 - Stepdown/ICU TEAM Progress Note  Becky Patterson JYN:[id number] DOB: [birth year] DOA: 06/15/2015 PCP: No primary care provider on file.  Admit HPI / Brief Narrative: 60 y.o. WF PMHx Tobacco Abuse   Presented with fever and cough. 2 or 3 weeks ago she started to develop malaise and diarrhea, with occasional emesis and decreased appetite. The day of her admission she developed fever and shakes, so her family made her come to the ER.  In the ED, the patient had a temperature to 102.56F, HR 127 bpm, tachypnea, and low blood pressure. Lactic acid level was initially 3. She had leukocytosis and a chest x-ray that showed a left lower lobe opacity.   Of note, the patient has had increased low back pain for a week, and is felt to have possibly lost 20 pounds in the last several weeks of this illness. She has never had a colonoscopy, but states she has had a mammogram. Because of the nodular appearance of her left lower lobe opacity, CT was obtained which showed a well-circumscribed nodule 2 cm in diameter in the left lower lobe. In addition radiographs of the low back showed what appeared to be sclerotic lesions of the pelvis.   HPI/S ubjective: Mild pain in left upper quadrant. No nausea, vomiting. Still having 1-2 loose stools daily.   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 -Counseled patient on the absolute need to discontinue smoking completely and permanently  Anemia of chronic disease  - minor drop noted today after being started on Heparin- no signs of bleeding -  follow hemoglobin trend  Hyponatremia  - appears to have been due to hypovolemia  Hyperglycemia -Hemoglobin A1c 6.9-  diabetes teaching  Hypomagnesemia - replaced  Hypokalemia - improved with replacement    Code Status: FULL Family Communication: husband at bedside Disposition Plan: PET/CT?    Consultants: IR  Procedure/Significant Events: 1/22 CT chest WO contrast;- circumscribed nodule superior segment left lower lobe measuring 1.8 cm diameter. Suspicious for malignancy, 1/23 CT abdomen pelvis with contrast;Findings consistent with pyelonephritis which appears Lt>>>Rt.-Negative for colitis. -S/P cholecystectomy. -Diverticulosis without diverticulitis.   Antibiotics: Azithro 1/23 1 dose Ceftriaxone 1/23 >>> Zosyn 1/22 1 dose Vanc 1/22 2 doses  DVT prophylaxis: Lovenox   Devices NA   LINES / TUBES:  NA    Continuous Infusions: . heparin 1,500 Units/hr (06/22/15 1221)    Objective: VITAL SIGNS: Temp: 99.3 F (37.4 C) (01/29 0659) Temp Source: Axillary (01/29 0659) BP: 95/47 mmHg (01/29 0659) Pulse Rate: 104 (01/29 0659) SPO2; FIO2:   Intake/Output Summary (Last 24 hours) at 06/22/15 1339 Last data filed at 06/22/15 0840  Gross per 24 hour  Intake    290 ml  Output   1000 ml  Net   -710 ml  Exam: General:A/O 4, NAD,, No acute respiratory distress Eyes: Negative headache, negative scleral hemorrhage ENT: Negative Runny nose, negative gingival bleeding, Neck:  Negative scars, masses, torticollis, lymphadenopathy, JVD Lungs: Clear to auscultation bilaterally without wheezes or crackles Cardiovascular: Regular  rate and rhythm without murmur gallop or rub normal S1 and S2 Abdomen:negative abdominal pain, nondistended, positive soft, bowel sounds, no rebound, no ascites, no appreciable mass, positive right CVA tenderness Extremities: No significant cyanosis, clubbing, or edema bilateral lower extremities Psychiatric:  Negative depression, negative anxiety, negative fatigue, negative mania  Neurologic:  Cranial nerves II through XII intact, tongue/uvula midline, all extremities muscle strength 5/5, sensation intact throughout, negative dysarthria, negative expressive aphasia, negative receptive aphasia.   Data Reviewed: Basic Metabolic Panel:  Recent Labs Lab 06/15/15 1520 06/15/15 2248 06/16/15 0439 06/17/15 0527 06/17/15 1315 06/19/15 0426  NA 127*  --  135 132*  --  134*  K 3.7  --  3.5 3.4* 3.5 3.5  CL 93*  --  104 101  --  100*  CO2 22  --  23 22  --  22  GLUCOSE 199*  --  213* 120*  --  150*  BUN 12  --  13 <5*  --  <5*  CREATININE 1.16* 1.04* 0.99 0.85  --  0.97  CALCIUM 8.7*  --  7.9* 8.1*  --  8.1*  MG  --   --   --   --  1.4* 1.9   Liver Function Tests:  Recent Labs Lab 06/15/15 1520 06/17/15 0527  AST 30 20  ALT 30 27  ALKPHOS 115 79  BILITOT 0.6 0.3  PROT 6.9 5.4*  ALBUMIN 2.4* 1.9*   No results for input(s): LIPASE, AMYLASE in the last 168 hours. No results for input(s): AMMONIA in the last 168 hours. CBC:  Recent Labs Lab 06/15/15 1520  06/19/15 0912 06/20/15 1315 06/20/15 1940 06/21/15 0842 06/22/15 0230  WBC 22.8*  < > 22.0* 22.2* 18.0* 24.9* 18.9*  NEUTROABS 20.8*  --   --   --  14.8* 23.9*  --   HGB 11.2*  < > 8.5* 8.2* 8.1* 8.4* 7.6*  HCT 32.6*  < > 24.7* 24.6* 24.5* 25.1* 22.7*  MCV 92.4  < > 92.2 92.8 92.5 92.3 92.3  PLT 230  < > 242 301 335 301 340  < > = values in this interval not displayed. Cardiac Enzymes: No results for input(s): CKTOTAL, CKMB, CKMBINDEX, TROPONINI in the last 168 hours. BNP (last 3 results) No results for input(s): BNP  in the last 8760 hours.  ProBNP (last 3 results) No results for input(s): PROBNP in the last 8760 hours.  CBG: No results for input(s): GLUCAP in the last 168 hours.  Recent Results (from the past 240 hour(s))  Culture, blood (routine x 2)     Status: None   Collection Time: 06/15/15  1:00 PM  Result Value Ref Range Status   Specimen Description BLOOD RIGHT ARM  Final   Special Requests BOTTLES DRAWN AEROBIC AND ANAEROBIC 5 CC  Final   Culture  Setup Time   Final    GRAM NEGATIVE RODS IN BOTH AEROBIC AND ANAEROBIC BOTTLES CRITICAL RESULT CALLED TO, READ BACK BY AND VERIFIED WITH: L. HITT RN [id number] [place]     Culture ENTEROBACTER CLOACAE  Final   Report Status 06/20/2015 FINAL  Final   Organism ID, Bacteria ENTEROBACTER CLOACAE  Final      Susceptibility   Enterobacter  cloacae - MIC*    CEFAZOLIN 8 RESISTANT Resistant     CEFEPIME <=1 SENSITIVE Sensitive     CEFTAZIDIME <=1 SENSITIVE Sensitive     CEFTRIAXONE <=1 SENSITIVE Sensitive     CIPROFLOXACIN <=0.25 SENSITIVE Sensitive     GENTAMICIN <=1 SENSITIVE Sensitive     IMIPENEM <=0.25 SENSITIVE Sensitive     TRIMETH/SULFA <=20 SENSITIVE Sensitive     PIP/TAZO <=4 SENSITIVE Sensitive     * ENTEROBACTER CLOACAE  Culture, blood (routine x 2)     Status: None   Collection Time: 06/15/15  3:09 PM  Result Value Ref Range Status   Specimen Description BLOOD RIGHT HAND  Final   Special Requests BOTTLES DRAWN AEROBIC ONLY 10CC  Final   Culture  Setup Time   Final    GRAM NEGATIVE RODS AEROBIC BOTTLE ONLY CRITICAL RESULT CALLED TO, READ BACK BY AND VERIFIED WITH: L. HITT RN [phone] [id number] GREEN R CONFIRMED BY M. CAMPBELL     Culture   Final    ENTEROBACTER CLOACAE SUSCEPTIBILITIES PERFORMED ON PREVIOUS CULTURE WITHIN THE LAST 5 DAYS.    Report Status 06/20/2015 FINAL  Final  Urine culture     Status: None   Collection Time: 06/15/15  4:10 PM  Result Value Ref Range Status   Specimen Description  URINE, RANDOM  Final   Special Requests NONE  Final   Culture >=100,000 COLONIES/mL ENTEROBACTER CLOACAE  Final   Report Status 06/18/2015 FINAL  Final   Organism ID, Bacteria ENTEROBACTER CLOACAE  Final      Susceptibility   Enterobacter cloacae - MIC*    CEFAZOLIN <=4 RESISTANT Resistant     CEFTRIAXONE <=1 SENSITIVE Sensitive     CIPROFLOXACIN <=0.25 SENSITIVE Sensitive     GENTAMICIN <=1 SENSITIVE Sensitive     IMIPENEM <=0.25 SENSITIVE Sensitive     NITROFURANTOIN <=16 SENSITIVE Sensitive     TRIMETH/SULFA <=20 SENSITIVE Sensitive     PIP/TAZO <=4 SENSITIVE Sensitive     * >=100,000 COLONIES/mL ENTEROBACTER CLOACAE  MRSA PCR Screening     Status: None   Collection Time: 06/15/15  9:59 PM  Result Value Ref Range Status   MRSA by PCR NEGATIVE NEGATIVE Final    Comment:        The GeneXpert MRSA Assay (FDA approved for NASAL specimens only), is one component of a comprehensive MRSA colonization surveillance program. It is not intended to diagnose MRSA infection nor to guide or monitor treatment for MRSA infections.   C difficile quick scan w PCR reflex     Status: None   Collection Time: 06/16/15 10:54 AM  Result Value Ref Range Status   C Diff antigen NEGATIVE NEGATIVE Final   C Diff toxin NEGATIVE NEGATIVE Final   C Diff interpretation Negative for toxigenic C. difficile  Final  Culture, blood (Routine X 2) w Reflex to ID Panel     Status: None (Preliminary result)   Collection Time: 06/18/15  2:00 PM  Result Value Ref Range Status   Specimen Description BLOOD RIGHT HAND  Final   Special Requests BOTTLES DRAWN AEROBIC AND ANAEROBIC 10CC  Final   Culture NO GROWTH 3 DAYS  Final   Report Status PENDING  Incomplete  Culture, blood (Routine X 2) w Reflex to ID Panel     Status: None (Preliminary result)   Collection Time: 06/18/15  2:15 PM  Result Value Ref Range Status   Specimen Description BLOOD LEFT ANTECUBITAL  Final   Special Requests BOTTLES  DRAWN AEROBIC AND  ANAEROBIC 10CC  Final   Culture NO GROWTH 3 DAYS  Final   Report Status PENDING  Incomplete     Studies:  Recent x-ray studies have been reviewed in detail by the Attending Physician  Scheduled Meds:  Scheduled Meds: . cefTRIAXone (ROCEPHIN)  IV  2 g Intravenous Q24H  . sodium chloride  3 mL Intravenous Q12H  . temazepam  7.5 mg Oral QHS    Time spent on care of this patient: 20 mins   [facility] , MD  [facility] Office  [phone] Pager - [phone]  On-Call/Text Page:      [email or web address]      password [email or web address]  If 7PM-7AM, please contact night-coverage [email or web address] Password [email or web address] 06/22/2015, 1:39 PM   LOS: 7 days

## 2015-06-22 NOTE — Consult Note (Signed)
[facility] for Heparin Indication: splenic infarcts  Allergies  Allergen Reactions  . Codeine Nausea And Vomiting    Patient Measurements: Height: '5\' 2"'$  (157.5 cm) Weight: 202 lb 9.6 oz (91.9 kg) IBW/kg (Calculated) : 50.1 Heparin Dosing Weight: ~71kg  Vital Signs: Temp: 98.8 F (37.1 C) (01/28 2006) Temp Source: Oral (01/28 2006) BP: 103/56 mmHg (01/28 2006) Pulse Rate: 88 (01/28 2006)  Labs:  Recent Labs  06/19/15 0426  06/20/15 1940 06/21/15 0842 06/22/15 0230  HGB 8.7*  < > 8.1* 8.4* 7.6*  HCT 25.2*  < > 24.5* 25.1* 22.7*  PLT 238  < > 335 301 340  HEPARINUNFRC  --   --   --   --  <0.10*  CREATININE 0.97  --   --   --   --   < > = values in this interval not displayed.  Estimated Creatinine Clearance: 64.2 mL/min (by C-G formula based on Cr of 0.97).   Medical History: History reviewed. No pertinent past medical history.  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 Eventual transition will be to [place]. He received a dose of prophylactic lovenox '40mg'$  at 0907 this morning so will not bolus.  Initial HL < 0.10  Goal of Therapy:  Heparin level 0.3-0.7 units/ml Monitor platelets by anticoagulation protocol: Yes   Plan:  Heparin 2000 units iv bolus x 1 Heparin to 1500 units / hr 6 hour heparin level  Thank you Anette Guarneri, PharmD [phone] 06/22/2015,3:41 AM

## 2015-06-22 NOTE — Progress Notes (Signed)
I called Becky Patterson with Echo to make sure pt was on her list to be done today if at all possible.  She will call me back if cannot do it.  To be done possibly this afternoon.

## 2015-06-22 NOTE — Progress Notes (Signed)
  Echocardiogram 2D Echocardiogram has been performed.  Becky Patterson 06/22/2015, 3:00 PM

## 2015-06-22 NOTE — Consult Note (Addendum)
Becky Patterson for Heparin Indication: splenic infarcts  Allergies  Allergen Reactions  . Codeine Nausea And Vomiting    Patient Measurements: Height: '5\' 2"'$  (157.5 cm) Weight: 202 lb 9.6 oz (91.9 kg) IBW/kg (Calculated) : 50.1 Heparin Dosing Weight: ~71kg  Vital Signs: Temp: 99.3 F (37.4 C) (01/29 0659) Temp Source: Axillary (01/29 0659) BP: 95/47 mmHg (01/29 0659) Pulse Rate: 104 (01/29 0659)  Labs:  Recent Labs  06/20/15 1940 06/21/15 0842 06/22/15 0230 06/22/15 1121  HGB 8.1* 8.4* 7.6*  --   HCT 24.5* 25.1* 22.7*  --   PLT 335 301 340  --   HEPARINUNFRC  --   --  <0.10* <0.10*    Estimated Creatinine Clearance: 64.2 mL/min (by C-G formula based on Cr of 0.97).   Medical History: History reviewed. No pertinent past medical history.  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 Eventual transition will be to [place].   HL remains undetectable. H/H 7.6/22.7, Plt wnl  Goal of Therapy:  Heparin level 0.3-0.7 units/ml Monitor platelets by anticoagulation protocol: Yes   Plan:  Heparin 2000 units iv bolus x 1 Heparin to 1700 units / hr 6 hour heparin level  Albertina Parr, PharmD., BCPS Clinical Pharmacist Pager [phone]   Addendum: Heparin level remains undetectable. Re-bolus heparin 2000 units x 1 and increase rate to 2000 units/hr. Follow up AM labs.  Nena Jordan, PharmD, BCPS 06/22/2015, 10:25 PM

## 2015-06-22 NOTE — Progress Notes (Signed)
[facility] for Infectious Disease   Interval History: CT with splenic infarcts  Physical Exam: Constitutional:  Filed Vitals:   06/21/15 2006 06/22/15 0659  BP: 103/56 95/47  Pulse: 88 104  Temp: 98.8 F (37.1 C) 99.3 F (37.4 C)  Resp: 17 17     Lab Results  Component Value Date   WBC 18.9* 06/22/2015   HGB 7.6* 06/22/2015   HCT 22.7* 06/22/2015   MCV 92.3 06/22/2015   PLT 340 06/22/2015    Lab Results  Component Value Date   CREATININE 0.97 06/19/2015   BUN <5* 06/19/2015   NA 134* 06/19/2015   K 3.5 06/19/2015   CL 100* 06/19/2015   CO2 22 06/19/2015    Lab Results  Component Value Date   ALT 27 06/17/2015   AST 20 06/17/2015   ALKPHOS 79 06/17/2015     Microbiology: Recent Results (from the past 240 hour(s))  Culture, blood (routine x 2)     Status: None   Collection Time: 06/15/15  1:00 PM  Result Value Ref Range Status   Specimen Description BLOOD RIGHT ARM  Final   Special Requests BOTTLES DRAWN AEROBIC AND ANAEROBIC 5 CC  Final   Culture  Setup Time   Final    GRAM NEGATIVE RODS IN BOTH AEROBIC AND ANAEROBIC BOTTLES CRITICAL RESULT CALLED TO, READ BACK BY AND VERIFIED WITH: L. HITT RN [id number] [phone] GREEN R CONFIRMED BY [place]     Culture ENTEROBACTER CLOACAE  Final   Report Status 06/20/2015 FINAL  Final   Organism ID, Bacteria ENTEROBACTER CLOACAE  Final      Susceptibility   Enterobacter cloacae - MIC*    CEFAZOLIN 8 RESISTANT Resistant     CEFEPIME <=1 SENSITIVE Sensitive     CEFTAZIDIME <=1 SENSITIVE Sensitive     CEFTRIAXONE <=1 SENSITIVE Sensitive     CIPROFLOXACIN <=0.25 SENSITIVE Sensitive     GENTAMICIN <=1 SENSITIVE Sensitive     IMIPENEM <=0.25 SENSITIVE Sensitive     TRIMETH/SULFA <=20 SENSITIVE Sensitive     PIP/TAZO <=4 SENSITIVE Sensitive     * ENTEROBACTER CLOACAE  Culture, blood (routine x 2)     Status: None   Collection Time: 06/15/15  3:09 PM  Result Value Ref Range Status   Specimen Description BLOOD  RIGHT HAND  Final   Special Requests BOTTLES DRAWN AEROBIC ONLY 10CC  Final   Culture  Setup Time   Final    GRAM NEGATIVE RODS AEROBIC BOTTLE ONLY CRITICAL RESULT CALLED TO, READ BACK BY AND VERIFIED WITH: L. HITT RN [phone] GREEN R CONFIRMED BY M. CAMPBELL     Culture   Final    ENTEROBACTER CLOACAE SUSCEPTIBILITIES PERFORMED ON PREVIOUS CULTURE WITHIN THE LAST 5 DAYS.    Report Status 06/20/2015 FINAL  Final  Urine culture     Status: None   Collection Time: 06/15/15  4:10 PM  Result Value Ref Range Status   Specimen Description URINE, RANDOM  Final   Special Requests NONE  Final   Culture >=100,000 COLONIES/mL ENTEROBACTER CLOACAE  Final   Report Status 06/18/2015 FINAL  Final   Organism ID, Bacteria ENTEROBACTER CLOACAE  Final      Susceptibility   Enterobacter cloacae - MIC*    CEFAZOLIN <=4 RESISTANT Resistant     CEFTRIAXONE <=1 SENSITIVE Sensitive     CIPROFLOXACIN <=0.25 SENSITIVE Sensitive     GENTAMICIN <=1 SENSITIVE Sensitive     IMIPENEM <=0.25 SENSITIVE Sensitive  NITROFURANTOIN <=16 SENSITIVE Sensitive     TRIMETH/SULFA <=20 SENSITIVE Sensitive     PIP/TAZO <=4 SENSITIVE Sensitive     * >=100,000 COLONIES/mL ENTEROBACTER CLOACAE  MRSA PCR Screening     Status: None   Collection Time: 06/15/15  9:59 PM  Result Value Ref Range Status   MRSA by PCR NEGATIVE NEGATIVE Final    Comment:        The GeneXpert MRSA Assay (FDA approved for NASAL specimens only), is one component of a comprehensive MRSA colonization surveillance program. It is not intended to diagnose MRSA infection nor to guide or monitor treatment for MRSA infections.   C difficile quick scan w PCR reflex     Status: None   Collection Time: 06/16/15 10:54 AM  Result Value Ref Range Status   C Diff antigen NEGATIVE NEGATIVE Final   C Diff toxin NEGATIVE NEGATIVE Final   C Diff interpretation Negative for toxigenic C. difficile  Final  Culture, blood (Routine X 2) w Reflex to ID Panel      Status: None (Preliminary result)   Collection Time: 06/18/15  2:00 PM  Result Value Ref Range Status   Specimen Description BLOOD RIGHT HAND  Final   Special Requests BOTTLES DRAWN AEROBIC AND ANAEROBIC 10CC  Final   Culture NO GROWTH 3 DAYS  Final   Report Status PENDING  Incomplete  Culture, blood (Routine X 2) w Reflex to ID Panel     Status: None (Preliminary result)   Collection Time: 06/18/15  2:15 PM  Result Value Ref Range Status   Specimen Description BLOOD LEFT ANTECUBITAL  Final   Special Requests BOTTLES DRAWN AEROBIC AND ANAEROBIC 10CC  Final   Culture NO GROWTH 3 DAYS  Final   Report Status PENDING  Incomplete    Impression/Plan:  1. Pyelonephritis - repeat blood cultures are negative.  Continue with current treatment.  Adding a second antibiotics does not add any benefit with known sensitivity to ceftriaxone. Still an unusual cause of community acquired infection.  2.  Splenic infarct - unknown etiology.  Started anticoagulation. Getting TTE and TEE but Enterobacter not a typical cause of endocarditis.  ? If colonoscopy indicated for cancer evaluation

## 2015-06-22 NOTE — Progress Notes (Signed)
Lauren here to do 2d Echo.

## 2015-06-23 LAB — BASIC METABOLIC PANEL
ANION GAP: 12 (ref 5–15)
BUN: 7 mg/dL (ref 6–20)
CALCIUM: 8.2 mg/dL — AB (ref 8.9–10.3)
CO2: 24 mmol/L (ref 22–32)
Chloride: 97 mmol/L — ABNORMAL LOW (ref 101–111)
Creatinine, Ser: 0.95 mg/dL (ref 0.44–1.00)
Glucose, Bld: 236 mg/dL — ABNORMAL HIGH (ref 65–99)
Potassium: 3.2 mmol/L — ABNORMAL LOW (ref 3.5–5.1)
Sodium: 133 mmol/L — ABNORMAL LOW (ref 135–145)

## 2015-06-23 LAB — CULTURE, BLOOD (ROUTINE X 2)
CULTURE: NO GROWTH
CULTURE: NO GROWTH

## 2015-06-23 LAB — CEA: CEA: 1.8 ng/mL (ref 0.0–4.7)

## 2015-06-23 LAB — CBC
HCT: 22.9 % — ABNORMAL LOW (ref 36.0–46.0)
HEMOGLOBIN: 7.8 g/dL — AB (ref 12.0–15.0)
MCH: 31.3 pg (ref 26.0–34.0)
MCHC: 34.1 g/dL (ref 30.0–36.0)
MCV: 92 fL (ref 78.0–100.0)
Platelets: 321 10*3/uL (ref 150–400)
RBC: 2.49 MIL/uL — AB (ref 3.87–5.11)
RDW: 13.8 % (ref 11.5–15.5)
WBC: 20 10*3/uL — ABNORMAL HIGH (ref 4.0–10.5)

## 2015-06-23 LAB — HEPARIN LEVEL (UNFRACTIONATED)
HEPARIN UNFRACTIONATED: 0.11 [IU]/mL — AB (ref 0.30–0.70)
Heparin Unfractionated: <0.1 IU/mL — ABNORMAL LOW (ref 0.30–0.70)

## 2015-06-23 LAB — CANCER ANTIGEN 19-9: CA 19 9: 6 U/mL (ref 0–35)

## 2015-06-23 MED ORDER — POTASSIUM CHLORIDE CRYS ER 20 MEQ PO TBCR
40.0000 meq | EXTENDED_RELEASE_TABLET | ORAL | Status: AC
  Administered 2015-06-23 (×2): 40 meq via ORAL
  Filled 2015-06-23 (×2): qty 2

## 2015-06-23 MED ORDER — HEPARIN BOLUS VIA INFUSION
2000.0000 [IU] | Freq: Once | INTRAVENOUS | Status: AC
  Administered 2015-06-23: 2000 [IU] via INTRAVENOUS
  Filled 2015-06-23: qty 2000

## 2015-06-23 NOTE — Consult Note (Signed)
[facility] for Heparin Indication: splenic infarcts  Allergies  Allergen Reactions  . Codeine Nausea And Vomiting    Patient Measurements: Height: '5\' 2"'$  (157.5 cm) Weight: 202 lb 9.6 oz (91.9 kg) IBW/kg (Calculated) : 50.1 Heparin Dosing Weight: ~71kg  Vital Signs: Temp: 98 F (36.7 C) (01/30 0621) Temp Source: Oral (01/30 0621) BP: 109/53 mmHg (01/30 0621) Pulse Rate: 91 (01/30 0621)  Labs:  Recent Labs  06/21/15 0842  06/22/15 0230 06/22/15 1121 06/22/15 2040 06/23/15 0850  HGB 8.4*  --  7.6*  --   --  7.8*  HCT 25.1*  --  22.7*  --   --  22.9*  PLT 301  --  340  --   --  321  HEPARINUNFRC  --   < > <0.10* <0.10* <0.10* <0.10*  CREATININE  --   --   --   --   --  0.95  < > = values in this interval not displayed.  Estimated Creatinine Clearance: 65.6 mL/min (by C-G formula based on Cr of 0.95).   Medical History: History reviewed. No pertinent past medical history.  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 Eventual transition will be to [place].   HL remains undetectable after increases in rate. No issues with heparin per RN. HL remains undetectable at < 0.1 at 2,000 units/hr. Hgb low but stable at 7.8. Plts wnl. No s/s of bleed  Goal of Therapy:  Heparin level 0.3-0.7 units/ml Monitor platelets by anticoagulation protocol: Yes   Plan:  Give heparin 2000 unit IV bolus Increase heparin gtt to 2,300 units/hr Monitor daily HL, CBC, s/s of bleed Check 6 hr HL   Elenor Quinones, PharmD, [facility] Clinical Pharmacist Pager [phone] 06/23/2015 11:17 AM

## 2015-06-23 NOTE — Progress Notes (Signed)
Dr. Wynelle Cleveland notified of temp 102.7

## 2015-06-23 NOTE — Progress Notes (Signed)
[facility] TEAM 1 - Stepdown/ICU TEAM Progress Note  Becky Patterson NWG:[id number] DOB: January 30, 1956 DOA: 06/15/2015 PCP: No primary care provider on file.  Admit HPI / Brief Narrative: 60 y.o. WF PMHx Tobacco Abuse   Presented with fever and cough. 2 or 3 weeks ago she started to develop malaise and diarrhea, with occasional emesis and decreased appetite. The day of her admission she developed fever and shakes, so her family made her come to the ER.  In the ED, the patient had a temperature to 102.34F, HR 127 bpm, tachypnea, and low blood pressure. Lactic acid level was initially 3. She had leukocytosis and a chest x-ray that showed a left lower lobe opacity.   Of note, the patient has had increased low back pain for a week, and is felt to have possibly lost 20 pounds in the last several weeks of this illness. She has never had a colonoscopy, but states she has had a mammogram. Because of the nodular appearance of her left lower lobe opacity, CT was obtained which showed a well-circumscribed nodule 2 cm in diameter in the left lower lobe. In addition radiographs of the low back showed what appeared to be sclerotic lesions of the pelvis.   HPI/S ubjective: No new complaints.   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 -Counseled patient on the absolute need to discontinue smoking completely and permanently  Anemia of chronic disease  - minor drop noted today after being started on Heparin- no signs of bleeding -  follow hemoglobin trend  Hyperglycemia -Hemoglobin A1c 6.9-  diabetes teaching  Hypomagnesemia - replaced  Hypokalemia - improved with replacement    Code Status: FULL Family Communication: husband at bedside Disposition Plan: PET/CT?    Consultants: IR  Procedure/Significant Events: 1/22 CT chest WO contrast;- circumscribed nodule superior segment left lower lobe measuring 1.8 cm diameter. Suspicious for malignancy, 1/23 CT abdomen pelvis with contrast;Findings consistent with pyelonephritis which appears Lt>>>Rt.-Negative for colitis. -S/P cholecystectomy. -Diverticulosis without diverticulitis.   Antibiotics: Azithro 1/23 1 dose Ceftriaxone 1/23 >>> Zosyn 1/22 1 dose Vanc 1/22 2 doses  DVT prophylaxis: Lovenox   Devices NA   LINES / TUBES:  NA    Continuous Infusions: . heparin 2,300 Units/hr (06/23/15 1109)    Objective: VITAL SIGNS: Temp: 98 F (36.7 C) (01/30 0621) Temp Source: Oral (01/30 0621) BP: 109/53 mmHg (01/30 0621) Pulse Rate: 91 (01/30 0621) SPO2; FIO2:   Intake/Output Summary (Last 24 hours) at 06/23/15 1245 Last data filed at 06/23/15 5784  Gross per 24 hour  Intake 1211.63 ml  Output      0 ml  Net 1211.63 ml     Exam: General:A/O 4, NAD,, No acute respiratory distress Eyes: Negative headache, negative scleral hemorrhage ENT: Negative Runny  nose, negative gingival bleeding, Neck:  Negative scars, masses, torticollis, lymphadenopathy, JVD Lungs: Clear to auscultation bilaterally without wheezes or crackles Cardiovascular: Regular rate and rhythm without murmur gallop or rub normal S1 and S2 Abdomen: nondistended, positive soft, bowel sounds, no  rebound, no ascites, no appreciable mass- mild left upper abdominal tenderness  Extremities: No significant cyanosis, clubbing, or edema bilateral lower extremities Psychiatric:  Negative depression, negative anxiety, negative fatigue, negative mania  Neurologic:  Cranial nerves II through XII intact, tongue/uvula midline, all extremities muscle strength 5/5, sensation intact throughout, negative dysarthria, negative expressive aphasia, negative receptive aphasia.   Data Reviewed: Basic Metabolic Panel:  Recent Labs Lab 06/17/15 0527 06/17/15 1315 06/19/15 0426 06/23/15 0850  NA 132*  --  134* 133*  K 3.4* 3.5 3.5 3.2*  CL 101  --  100* 97*  CO2 22  --  22 24  GLUCOSE 120*  --  150* 236*  BUN <5*  --  <5* 7  CREATININE 0.85  --  0.97 0.95  CALCIUM 8.1*  --  8.1* 8.2*  MG  --  1.4* 1.9  --    Liver Function Tests:  Recent Labs Lab 06/17/15 0527  AST 20  ALT 27  ALKPHOS 79  BILITOT 0.3  PROT 5.4*  ALBUMIN 1.9*   No results for input(s): LIPASE, AMYLASE in the last 168 hours. No results for input(s): AMMONIA in the last 168 hours. CBC:  Recent Labs Lab 06/20/15 1315 06/20/15 1940 06/21/15 0842 06/22/15 0230 06/23/15 0850  WBC 22.2* 18.0* 24.9* 18.9* 20.0*  NEUTROABS  --  14.8* 23.9*  --   --   HGB 8.2* 8.1* 8.4* 7.6* 7.8*  HCT 24.6* 24.5* 25.1* 22.7* 22.9*  MCV 92.8 92.5 92.3 92.3 92.0  PLT 301 335 301 340 321   Cardiac Enzymes: No results for input(s): CKTOTAL, CKMB, CKMBINDEX, TROPONINI in the last 168 hours. BNP (last 3 results) No results for input(s): BNP in the last 8760 hours.  ProBNP (last 3 results) No results for input(s): PROBNP in the last 8760 hours.  CBG: No results for input(s): GLUCAP in the last 168 hours.  Recent Results (from the past 240 hour(s))  Culture, blood (routine x 2)     Status: None   Collection Time: 06/15/15  1:00 PM  Result Value Ref Range Status   Specimen Description BLOOD RIGHT ARM  Final   Special Requests  BOTTLES DRAWN AEROBIC AND ANAEROBIC 5 CC  Final   Culture  Setup Time   Final    GRAM NEGATIVE RODS IN BOTH AEROBIC AND ANAEROBIC BOTTLES CRITICAL RESULT CALLED TO, READ BACK BY AND VERIFIED WITH: L. HITT RN [phone] [place]     Culture ENTEROBACTER CLOACAE  Final   Report Status 06/20/2015 FINAL  Final   Organism ID, Bacteria ENTEROBACTER CLOACAE  Final      Susceptibility   Enterobacter cloacae - MIC*    CEFAZOLIN 8 RESISTANT Resistant     CEFEPIME <=1 SENSITIVE Sensitive     CEFTAZIDIME <=1 SENSITIVE Sensitive     CEFTRIAXONE <=1 SENSITIVE Sensitive     CIPROFLOXACIN <=0.25 SENSITIVE Sensitive     GENTAMICIN <=1 SENSITIVE Sensitive     IMIPENEM <=0.25 SENSITIVE Sensitive     TRIMETH/SULFA <=20 SENSITIVE Sensitive     PIP/TAZO <=4 SENSITIVE Sensitive     * ENTEROBACTER CLOACAE  Culture, blood (routine x 2)     Status: None   Collection Time: 06/15/15  3:09  PM  Result Value Ref Range Status   Specimen Description BLOOD RIGHT HAND  Final   Special Requests BOTTLES DRAWN AEROBIC ONLY 10CC  Final   Culture  Setup Time   Final    GRAM NEGATIVE RODS AEROBIC BOTTLE ONLY CRITICAL RESULT CALLED TO, READ BACK BY AND VERIFIED WITH: L. HITT RN [phone] 0528 GREEN R CONFIRMED BY M. CAMPBELL     Culture   Final    ENTEROBACTER CLOACAE SUSCEPTIBILITIES PERFORMED ON PREVIOUS CULTURE WITHIN THE LAST 5 DAYS.    Report Status 06/20/2015 FINAL  Final  Urine culture     Status: None   Collection Time: 06/15/15  4:10 PM  Result Value Ref Range Status   Specimen Description URINE, RANDOM  Final   Special Requests NONE  Final   Culture >=100,000 COLONIES/mL ENTEROBACTER CLOACAE  Final   Report Status 06/18/2015 FINAL  Final   Organism ID, Bacteria ENTEROBACTER CLOACAE  Final      Susceptibility   Enterobacter cloacae - MIC*    CEFAZOLIN <=4 RESISTANT Resistant     CEFTRIAXONE <=1 SENSITIVE Sensitive     CIPROFLOXACIN <=0.25 SENSITIVE Sensitive     GENTAMICIN <=1  SENSITIVE Sensitive     IMIPENEM <=0.25 SENSITIVE Sensitive     NITROFURANTOIN <=16 SENSITIVE Sensitive     TRIMETH/SULFA <=20 SENSITIVE Sensitive     PIP/TAZO <=4 SENSITIVE Sensitive     * >=100,000 COLONIES/mL ENTEROBACTER CLOACAE  MRSA PCR Screening     Status: None   Collection Time: 06/15/15  9:59 PM  Result Value Ref Range Status   MRSA by PCR NEGATIVE NEGATIVE Final    Comment:        The GeneXpert MRSA Assay (FDA approved for NASAL specimens only), is one component of a comprehensive MRSA colonization surveillance program. It is not intended to diagnose MRSA infection nor to guide or monitor treatment for MRSA infections.   C difficile quick scan w PCR reflex     Status: None   Collection Time: 06/16/15 10:54 AM  Result Value Ref Range Status   C Diff antigen NEGATIVE NEGATIVE Final   C Diff toxin NEGATIVE NEGATIVE Final   C Diff interpretation Negative for toxigenic C. difficile  Final  Culture, blood (Routine X 2) w Reflex to ID Panel     Status: None (Preliminary result)   Collection Time: 06/18/15  2:00 PM  Result Value Ref Range Status   Specimen Description BLOOD RIGHT HAND  Final   Special Requests BOTTLES DRAWN AEROBIC AND ANAEROBIC 10CC  Final   Culture NO GROWTH 4 DAYS  Final   Report Status PENDING  Incomplete  Culture, blood (Routine X 2) w Reflex to ID Panel     Status: None (Preliminary result)   Collection Time: 06/18/15  2:15 PM  Result Value Ref Range Status   Specimen Description BLOOD LEFT ANTECUBITAL  Final   Special Requests BOTTLES DRAWN AEROBIC AND ANAEROBIC 10CC  Final   Culture NO GROWTH 4 DAYS  Final   Report Status PENDING  Incomplete  Gastrointestinal Panel by PCR , Stool     Status: None   Collection Time: 06/22/15  5:41 AM  Result Value Ref Range Status   Campylobacter species NOT DETECTED NOT DETECTED Final   Plesimonas shigelloides NOT DETECTED NOT DETECTED Final   Salmonella species NOT DETECTED NOT DETECTED Final   Yersinia  enterocolitica NOT DETECTED NOT DETECTED Final   Vibrio species NOT DETECTED NOT DETECTED Final   Vibrio cholerae  NOT DETECTED NOT DETECTED Final   Enteroaggregative E coli (EAEC) NOT DETECTED NOT DETECTED Final   Enteropathogenic E coli (EPEC) NOT DETECTED NOT DETECTED Final   Enterotoxigenic E coli (ETEC) NOT DETECTED NOT DETECTED Final   Shiga like toxin producing E coli (STEC) NOT DETECTED NOT DETECTED Final   E. coli O157 NOT DETECTED NOT DETECTED Final   Shigella/Enteroinvasive E coli (EIEC) NOT DETECTED NOT DETECTED Final   Cryptosporidium NOT DETECTED NOT DETECTED Final   Cyclospora cayetanensis NOT DETECTED NOT DETECTED Final   Entamoeba histolytica NOT DETECTED NOT DETECTED Final   Giardia lamblia NOT DETECTED NOT DETECTED Final   Adenovirus F40/41 NOT DETECTED NOT DETECTED Final   Astrovirus NOT DETECTED NOT DETECTED Final   Norovirus GI/GII NOT DETECTED NOT DETECTED Final   Rotavirus A NOT DETECTED NOT DETECTED Final   Sapovirus (I, II, IV, and V) NOT DETECTED NOT DETECTED Final     Studies:  Recent x-ray studies have been reviewed in detail by the Attending Physician  Scheduled Meds:  Scheduled Meds: . cefTRIAXone (ROCEPHIN)  IV  2 g Intravenous Q24H  . sodium chloride  3 mL Intravenous Q12H    Time spent on care of this patient: 20 mins   Debbe Odea , MD  [facility] Office  [phone] Pager - [phone]  On-Call/Text Page:      [email or web address]      password [email or web address]  If 7PM-7AM, please contact night-coverage [email or web address] Password [email or web address] 06/23/2015, 12:45 PM   LOS: 8 days

## 2015-06-23 NOTE — Care Management Note (Signed)
Case Management Note  Patient Details  Name: Becky Patterson MRN: [id number] Date of Birth: May 29, 1955  Subjective/Objective:                    Action/Plan:  UR updated Expected Discharge Date:                  Expected Discharge Plan:  Home/Self Care  In-House Referral:     Discharge planning Services  CM Consult  Post Acute Care Choice:    Choice offered to:     DME Arranged:    DME Agency:     HH Arranged:    [place] Agency:     Status of Service:  In process, will continue to follow  Medicare Important Message Given:    Date Medicare IM Given:    Medicare IM give by:    Date Additional Medicare IM Given:    Additional Medicare Important Message give by:     If discussed at [place] of Stay Meetings, dates discussed:    Additional Comments:  Marilu Favre, RN 06/23/2015, 1:26 PM

## 2015-06-23 NOTE — Progress Notes (Signed)
[facility] for Infectious Disease   Interval History: CT with splenic infarcts, TTE with vegetation  Physical Exam: Constitutional:  Filed Vitals:   06/22/15 2248 06/23/15 0621  BP: 88/43 109/53  Pulse: 88 91  Temp: 99.2 F (37.3 C) 98 F (36.7 C)  Resp: 16 16     Lab Results  Component Value Date   WBC 20.0* 06/23/2015   HGB 7.8* 06/23/2015   HCT 22.9* 06/23/2015   MCV 92.0 06/23/2015   PLT 321 06/23/2015    Lab Results  Component Value Date   CREATININE 0.95 06/23/2015   BUN 7 06/23/2015   NA 133* 06/23/2015   K 3.2* 06/23/2015   CL 97* 06/23/2015   CO2 24 06/23/2015    Lab Results  Component Value Date   ALT 27 06/17/2015   AST 20 06/17/2015   ALKPHOS 79 06/17/2015     Microbiology: Recent Results (from the past 240 hour(s))  Culture, blood (routine x 2)     Status: None   Collection Time: 06/15/15  1:00 PM  Result Value Ref Range Status   Specimen Description BLOOD RIGHT ARM  Final   Special Requests BOTTLES DRAWN AEROBIC AND ANAEROBIC 5 CC  Final   Culture  Setup Time   Final    GRAM NEGATIVE RODS IN BOTH AEROBIC AND ANAEROBIC BOTTLES CRITICAL RESULT CALLED TO, READ BACK BY AND VERIFIED WITH: L. HITT RN [id number] [phone] GREEN R CONFIRMED BY [place]     Culture ENTEROBACTER CLOACAE  Final   Report Status 06/20/2015 FINAL  Final   Organism ID, Bacteria ENTEROBACTER CLOACAE  Final      Susceptibility   Enterobacter cloacae - MIC*    CEFAZOLIN 8 RESISTANT Resistant     CEFEPIME <=1 SENSITIVE Sensitive     CEFTAZIDIME <=1 SENSITIVE Sensitive     CEFTRIAXONE <=1 SENSITIVE Sensitive     CIPROFLOXACIN <=0.25 SENSITIVE Sensitive     GENTAMICIN <=1 SENSITIVE Sensitive     IMIPENEM <=0.25 SENSITIVE Sensitive     TRIMETH/SULFA <=20 SENSITIVE Sensitive     PIP/TAZO <=4 SENSITIVE Sensitive     * ENTEROBACTER CLOACAE  Culture, blood (routine x 2)     Status: None   Collection Time: 06/15/15  3:09 PM  Result Value Ref Range Status   Specimen  Description BLOOD RIGHT HAND  Final   Special Requests BOTTLES DRAWN AEROBIC ONLY 10CC  Final   Culture  Setup Time   Final    GRAM NEGATIVE RODS AEROBIC BOTTLE ONLY CRITICAL RESULT CALLED TO, READ BACK BY AND VERIFIED WITH: L. HITT RN [phone] GREEN R CONFIRMED BY M. CAMPBELL     Culture   Final    ENTEROBACTER CLOACAE SUSCEPTIBILITIES PERFORMED ON PREVIOUS CULTURE WITHIN THE LAST 5 DAYS.    Report Status 06/20/2015 FINAL  Final  Urine culture     Status: None   Collection Time: 06/15/15  4:10 PM  Result Value Ref Range Status   Specimen Description URINE, RANDOM  Final   Special Requests NONE  Final   Culture >=100,000 COLONIES/mL ENTEROBACTER CLOACAE  Final   Report Status 06/18/2015 FINAL  Final   Organism ID, Bacteria ENTEROBACTER CLOACAE  Final      Susceptibility   Enterobacter cloacae - MIC*    CEFAZOLIN <=4 RESISTANT Resistant     CEFTRIAXONE <=1 SENSITIVE Sensitive     CIPROFLOXACIN <=0.25 SENSITIVE Sensitive     GENTAMICIN <=1 SENSITIVE Sensitive     IMIPENEM <=0.25  SENSITIVE Sensitive     NITROFURANTOIN <=16 SENSITIVE Sensitive     TRIMETH/SULFA <=20 SENSITIVE Sensitive     PIP/TAZO <=4 SENSITIVE Sensitive     * >=100,000 COLONIES/mL ENTEROBACTER CLOACAE  MRSA PCR Screening     Status: None   Collection Time: 06/15/15  9:59 PM  Result Value Ref Range Status   MRSA by PCR NEGATIVE NEGATIVE Final    Comment:        The GeneXpert MRSA Assay (FDA approved for NASAL specimens only), is one component of a comprehensive MRSA colonization surveillance program. It is not intended to diagnose MRSA infection nor to guide or monitor treatment for MRSA infections.   C difficile quick scan w PCR reflex     Status: None   Collection Time: 06/16/15 10:54 AM  Result Value Ref Range Status   C Diff antigen NEGATIVE NEGATIVE Final   C Diff toxin NEGATIVE NEGATIVE Final   C Diff interpretation Negative for toxigenic C. difficile  Final  Culture, blood (Routine X 2) w  Reflex to ID Panel     Status: None (Preliminary result)   Collection Time: 06/18/15  2:00 PM  Result Value Ref Range Status   Specimen Description BLOOD RIGHT HAND  Final   Special Requests BOTTLES DRAWN AEROBIC AND ANAEROBIC 10CC  Final   Culture NO GROWTH 4 DAYS  Final   Report Status PENDING  Incomplete  Culture, blood (Routine X 2) w Reflex to ID Panel     Status: None (Preliminary result)   Collection Time: 06/18/15  2:15 PM  Result Value Ref Range Status   Specimen Description BLOOD LEFT ANTECUBITAL  Final   Special Requests BOTTLES DRAWN AEROBIC AND ANAEROBIC 10CC  Final   Culture NO GROWTH 4 DAYS  Final   Report Status PENDING  Incomplete  Gastrointestinal Panel by PCR , Stool     Status: None   Collection Time: 06/22/15  5:41 AM  Result Value Ref Range Status   Campylobacter species NOT DETECTED NOT DETECTED Final   Plesimonas shigelloides NOT DETECTED NOT DETECTED Final   Salmonella species NOT DETECTED NOT DETECTED Final   Yersinia enterocolitica NOT DETECTED NOT DETECTED Final   Vibrio species NOT DETECTED NOT DETECTED Final   Vibrio cholerae NOT DETECTED NOT DETECTED Final   Enteroaggregative E coli (EAEC) NOT DETECTED NOT DETECTED Final   Enteropathogenic E coli (EPEC) NOT DETECTED NOT DETECTED Final   Enterotoxigenic E coli (ETEC) NOT DETECTED NOT DETECTED Final   Shiga like toxin producing E coli (STEC) NOT DETECTED NOT DETECTED Final   E. coli O157 NOT DETECTED NOT DETECTED Final   Shigella/Enteroinvasive E coli (EIEC) NOT DETECTED NOT DETECTED Final   Cryptosporidium NOT DETECTED NOT DETECTED Final   Cyclospora cayetanensis NOT DETECTED NOT DETECTED Final   Entamoeba histolytica NOT DETECTED NOT DETECTED Final   Giardia lamblia NOT DETECTED NOT DETECTED Final   Adenovirus F40/41 NOT DETECTED NOT DETECTED Final   Astrovirus NOT DETECTED NOT DETECTED Final   Norovirus GI/GII NOT DETECTED NOT DETECTED Final   Rotavirus A NOT DETECTED NOT DETECTED Final   Sapovirus  (I, II, IV, and V) NOT DETECTED NOT DETECTED Final    Impression/Plan:  1. Pyelonephritis - repeat blood cultures are negative.     2.  Vegetation - TEE to be done, unusual for Enterobacter endocarditis but appears to be true with splenic infarct that appears septic.

## 2015-06-23 NOTE — Consult Note (Signed)
[facility] for Heparin Indication: splenic infarcts  Allergies  Allergen Reactions  . Codeine Nausea And Vomiting    Patient Measurements: Height: '5\' 2"'$  (157.5 cm) Weight: 202 lb 9.6 oz (91.9 kg) IBW/kg (Calculated) : 50.1 Heparin Dosing Weight: ~71kg  Vital Signs: Temp: 98.8 F (37.1 C) (01/30 1522) Temp Source: Oral (01/30 1522) BP: 131/58 mmHg (01/30 1424) Pulse Rate: 102 (01/30 1424)  Labs:  Recent Labs  06/21/15 0842 06/22/15 0230  06/22/15 2040 06/23/15 0850 06/23/15 1711  HGB 8.4* 7.6*  --   --  7.8*  --   HCT 25.1* 22.7*  --   --  22.9*  --   PLT 301 340  --   --  321  --   HEPARINUNFRC  --  <0.10*  < > <0.10* <0.10* 0.11*  CREATININE  --   --   --   --  0.95  --   < > = values in this interval not displayed.  Estimated Creatinine Clearance: 65.6 mL/min (by C-G formula based on Cr of 0.95).   Medical History: History reviewed. No pertinent past medical history.  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 Eventual transition will be to [place].   HL remains low after increases in rate. No issues with heparin per RN. Heparin infusion currently at 2,300 units/hr. Hgb low but stable at 7.8. Plts wnl. No s/s of bleed  Goal of Therapy:  Heparin level 0.3-0.7 units/ml Monitor platelets by anticoagulation protocol: Yes   Plan:  Give heparin 2000 unit IV bolus Increase heparin gtt to 2,600 units/hr Monitor daily HL, CBC, s/s of bleed Check 6 hr HL   Albertina Parr, PharmD., BCPS Clinical Pharmacist Pager [phone]

## 2015-06-23 NOTE — Progress Notes (Signed)
Inpatient Diabetes Program Recommendations  AACE/ADA: New Consensus Statement on Inpatient Glycemic Control (2015)  Target Ranges:  Prepandial:   less than 140 mg/dL      Peak postprandial:   less than 180 mg/dL (1-2 hours)      Critically ill patients:  140 - 180 mg/dL   Results for Becky Patterson, Becky Patterson (MRN [id number]) as of 06/23/2015 10:10  Ref. Range 06/23/2015 08:50  Glucose Latest Ref Range: 65-99 mg/dL 236 (H)    Results for Becky Patterson, Becky Patterson (MRN [id number]) as of 06/23/2015 10:10  Ref. Range 06/17/2015 05:27  Hemoglobin A1C Latest Ref Range: 4.8-5.6 % 6.9 (H)    Admit with: Sepsis/ Lung/Liver Masses/ Splenic Infarcts  History: Tobacco Abuse     -Per MD notes, patient with hyperglycemia- needs diabetes teaching.  -Current A1c= 6.9%.  Has taken Prednisone taper prior to admission for back pain.   MD- Please consider starting Novolog Sensitive SSI (0-9 units) TID AC + HS     Addendum 11:20am- Discussed A1C results with patient and explained what an A1C is, basic pathophysiology of DM Type 2, basic home care, basic diabetes diet nutrition principles, importance of checking CBGs and maintaining good CBG control to prevent long-term and short-term complications.  Also reviewed blood sugar goals at home.  Gave patient two educational pamphlets on basic DM information and basic DM nutrition principles.  Patient not extremely interested in the information I provided to her.  Per sister at bedside, patient is supposed to follow up with PCP (Dr. Moreen Fowler) after d/c.  Patient stated both her sister and husband have DM and she already knows what to do.  Encouraged patient to please follow up with her PCP after d/c to discuss her A1c and current blood sugar levels.  When asked if she has ever checked her CBGs before, patient stated that her husband has checked her sugars before "to be mean".    RNs to provide ongoing basic DM education at bedside with this patient.      --Will follow  patient during hospitalization--  Wyn Quaker RN, MSN, CDE Diabetes Coordinator Inpatient Glycemic Control Team Team Pager: [phone] (8a-5p)

## 2015-06-24 ENCOUNTER — Encounter (HOSPITAL_COMMUNITY): Payer: Self-pay

## 2015-06-24 ENCOUNTER — Inpatient Hospital Stay (HOSPITAL_COMMUNITY): Payer: 59

## 2015-06-24 ENCOUNTER — Encounter (HOSPITAL_COMMUNITY)
Admission: EM | Disposition: A | Discharge status: Home / Self Care | Payer: Self-pay | Source: Home / Self Care | Attending: Internal Medicine

## 2015-06-24 DIAGNOSIS — B952 Enterococcus as the cause of diseases classified elsewhere: Secondary | ICD-10-CM

## 2015-06-24 DIAGNOSIS — I330 Acute and subacute infective endocarditis: Secondary | ICD-10-CM

## 2015-06-24 DIAGNOSIS — I349 Nonrheumatic mitral valve disorder, unspecified: Secondary | ICD-10-CM

## 2015-06-24 DIAGNOSIS — E119 Type 2 diabetes mellitus without complications: Secondary | ICD-10-CM

## 2015-06-24 HISTORY — PX: TEE WITHOUT CARDIOVERSION: SHX5443

## 2015-06-24 LAB — HEPARIN LEVEL (UNFRACTIONATED)
HEPARIN UNFRACTIONATED: 0.23 [IU]/mL — AB (ref 0.30–0.70)
Heparin Unfractionated: 0.35 IU/mL (ref 0.30–0.70)

## 2015-06-24 LAB — BASIC METABOLIC PANEL
Anion gap: 11 (ref 5–15)
BUN: 6 mg/dL (ref 6–20)
CHLORIDE: 98 mmol/L — AB (ref 101–111)
CO2: 25 mmol/L (ref 22–32)
Calcium: 8.4 mg/dL — ABNORMAL LOW (ref 8.9–10.3)
Creatinine, Ser: 0.93 mg/dL (ref 0.44–1.00)
GFR calc Af Amer: >60 mL/min (ref >60)
GFR calc non Af Amer: >60 mL/min (ref >60)
GLUCOSE: 133 mg/dL — AB (ref 65–99)
POTASSIUM: 4.2 mmol/L (ref 3.5–5.1)
SODIUM: 134 mmol/L — AB (ref 135–145)

## 2015-06-24 LAB — CBC
HCT: 23.2 % — ABNORMAL LOW (ref 36.0–46.0)
HEMOGLOBIN: 7.9 g/dL — AB (ref 12.0–15.0)
MCH: 31.5 pg (ref 26.0–34.0)
MCHC: 34.1 g/dL (ref 30.0–36.0)
MCV: 92.4 fL (ref 78.0–100.0)
Platelets: 360 10*3/uL (ref 150–400)
RBC: 2.51 MIL/uL — AB (ref 3.87–5.11)
RDW: 13.9 % (ref 11.5–15.5)
WBC: 18.9 10*3/uL — ABNORMAL HIGH (ref 4.0–10.5)

## 2015-06-24 LAB — GLUCOSE, CAPILLARY
GLUCOSE-CAPILLARY: 128 mg/dL — AB (ref 65–99)
GLUCOSE-CAPILLARY: 118 mg/dL — AB (ref 65–99)
Glucose-Capillary: 198 mg/dL — ABNORMAL HIGH (ref 65–99)

## 2015-06-24 SURGERY — ECHOCARDIOGRAM, TRANSESOPHAGEAL
Anesthesia: Moderate Sedation

## 2015-06-24 MED ORDER — SODIUM CHLORIDE 0.9 % IV SOLN: INTRAVENOUS | Status: DC

## 2015-06-24 MED ORDER — BUTAMBEN-TETRACAINE-BENZOCAINE 2-2-14 % EX AERO
INHALATION_SPRAY | CUTANEOUS | Status: DC | PRN
  Administered 2015-06-24: 2 via TOPICAL

## 2015-06-24 MED ORDER — MIDAZOLAM HCL 5 MG/ML IJ SOLN
INTRAMUSCULAR | Status: AC
  Filled 2015-06-24: qty 2

## 2015-06-24 MED ORDER — INSULIN ASPART 100 UNIT/ML ~~LOC~~ SOLN: 0.0000 [IU] | Freq: Every day | SUBCUTANEOUS | Status: DC

## 2015-06-24 MED ORDER — FENTANYL CITRATE (PF) 100 MCG/2ML IJ SOLN
INTRAMUSCULAR | Status: AC
  Filled 2015-06-24: qty 2

## 2015-06-24 MED ORDER — HEPARIN SODIUM (PORCINE) 5000 UNIT/ML IJ SOLN
5000.0000 [IU] | Freq: Three times a day (TID) | INTRAMUSCULAR | Status: DC
  Administered 2015-06-24 – 2015-06-26 (×6): 5000 [IU] via SUBCUTANEOUS
  Filled 2015-06-24 (×5): qty 1

## 2015-06-24 MED ORDER — INSULIN ASPART 100 UNIT/ML ~~LOC~~ SOLN
0.0000 [IU] | Freq: Three times a day (TID) | SUBCUTANEOUS | Status: DC
  Administered 2015-06-24: 1 [IU] via SUBCUTANEOUS
  Administered 2015-06-24: 2 [IU] via SUBCUTANEOUS
  Administered 2015-06-25 – 2015-06-26 (×2): 1 [IU] via SUBCUTANEOUS

## 2015-06-24 MED ORDER — MIDAZOLAM HCL 10 MG/2ML IJ SOLN
INTRAMUSCULAR | Status: DC | PRN
  Administered 2015-06-24 (×2): 2 mg via INTRAVENOUS

## 2015-06-24 MED ORDER — DICLOFENAC SODIUM 1 % TD GEL
2.0000 g | Freq: Four times a day (QID) | TRANSDERMAL | Status: DC
  Administered 2015-06-24 – 2015-06-25 (×6): 2 g via TOPICAL
  Filled 2015-06-24 (×2): qty 100

## 2015-06-24 MED ORDER — FERROUS SULFATE 325 (65 FE) MG PO TABS
325.0000 mg | ORAL_TABLET | Freq: Two times a day (BID) | ORAL | Status: DC
  Administered 2015-06-24 – 2015-06-26 (×4): 325 mg via ORAL
  Filled 2015-06-24 (×4): qty 1

## 2015-06-24 MED ORDER — FENTANYL CITRATE (PF) 100 MCG/2ML IJ SOLN
INTRAMUSCULAR | Status: DC | PRN
  Administered 2015-06-24 (×2): 25 ug via INTRAVENOUS

## 2015-06-24 MED ORDER — SODIUM CHLORIDE 0.9 % IV SOLN
INTRAVENOUS | Status: DC
  Administered 2015-06-24: 500 mL via INTRAVENOUS

## 2015-06-24 MED ORDER — DIPHENHYDRAMINE HCL 50 MG/ML IJ SOLN
INTRAMUSCULAR | Status: AC
Start: 2015-06-24 — End: 2015-06-24
  Filled 2015-06-24: qty 1

## 2015-06-24 NOTE — H&P (View-Only) (Signed)
[facility] TEAM 1 - Stepdown/ICU TEAM Progress Note  Becky Patterson:[id number] DOB: [birth year] DOA: 06/15/2015 PCP: No primary care provider on file.  Admit HPI / Brief Narrative: 60 y.o. WF PMHx Tobacco Abuse   Presented with fever and cough. 2 or 3 weeks ago she started to develop malaise and diarrhea, with occasional emesis and decreased appetite. The day of her admission she developed fever and shakes, so her family made her come to the ER.  In the ED, the patient had a temperature to 102.12F, HR 127 bpm, tachypnea, and low blood pressure. Lactic acid level was initially 3. She had leukocytosis and a chest x-ray that showed a left lower lobe opacity.   Of note, the patient has had increased low back pain for a week, and is felt to have possibly lost 20 pounds in the last several weeks of this illness. She has never had a colonoscopy, but states she has had a mammogram. Because of the nodular appearance of her left lower lobe opacity, CT was obtained which showed a well-circumscribed nodule 2 cm in diameter in the left lower lobe. In addition radiographs of the low back showed what appeared to be sclerotic lesions of the pelvis.   HPI/S ubjective: No new complaints.   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 -Counseled patient on the absolute need to discontinue smoking completely and permanently  Anemia of chronic disease  - minor drop noted today after being started on Heparin- no signs of bleeding -  follow hemoglobin trend  Hyperglycemia -Hemoglobin A1c 6.9-  diabetes teaching  Hypomagnesemia - replaced  Hypokalemia - improved with replacement    Code Status: FULL Family Communication: husband at bedside Disposition Plan: PET/CT?    Consultants: IR  Procedure/Significant Events: 1/22 CT chest WO contrast;- circumscribed nodule superior segment left lower lobe measuring 1.8 cm diameter. Suspicious for malignancy, 1/23 CT abdomen pelvis with contrast;Findings consistent with pyelonephritis which appears Lt>>>Rt.-Negative for colitis. -S/P cholecystectomy. -Diverticulosis without diverticulitis.   Antibiotics: Azithro 1/23 1 dose Ceftriaxone 1/23 >>> Zosyn 1/22 1 dose Vanc 1/22 2 doses  DVT prophylaxis: Lovenox   Devices NA   LINES / TUBES:  NA    Continuous Infusions: . heparin 2,300 Units/hr (06/23/15 1109)    Objective: VITAL SIGNS: Temp: 98 F (36.7 C) (01/30 0621) Temp Source: Oral (01/30 0621) BP: 109/53 mmHg (01/30 0621) Pulse Rate: 91 (01/30 0621) SPO2; FIO2:   Intake/Output Summary (Last 24 hours) at 06/23/15 1245 Last data filed at 06/23/15 8119  Gross per 24 hour  Intake 1211.63 ml  Output      0 ml  Net 1211.63 ml     Exam: General:A/O 4, NAD,, No acute respiratory distress Eyes: Negative headache, negative scleral hemorrhage ENT: Negative Runny  nose, negative gingival bleeding, Neck:  Negative scars, masses, torticollis, lymphadenopathy, JVD Lungs: Clear to auscultation bilaterally without wheezes or crackles Cardiovascular: Regular rate and rhythm without murmur gallop or rub normal S1 and S2 Abdomen: nondistended, positive soft, bowel sounds, no  rebound, no ascites, no appreciable mass- mild left upper abdominal tenderness  Extremities: No significant cyanosis, clubbing, or edema bilateral lower extremities Psychiatric:  Negative depression, negative anxiety, negative fatigue, negative mania  Neurologic:  Cranial nerves II through XII intact, tongue/uvula midline, all extremities muscle strength 5/5, sensation intact throughout, negative dysarthria, negative expressive aphasia, negative receptive aphasia.   Data Reviewed: Basic Metabolic Panel:  Recent Labs Lab 06/17/15 0527 06/17/15 1315 06/19/15 0426 06/23/15 0850  NA 132*  --  134* 133*  K 3.4* 3.5 3.5 3.2*  CL 101  --  100* 97*  CO2 22  --  22 24  GLUCOSE 120*  --  150* 236*  BUN <5*  --  <5* 7  CREATININE 0.85  --  0.97 0.95  CALCIUM 8.1*  --  8.1* 8.2*  MG  --  1.4* 1.9  --    Liver Function Tests:  Recent Labs Lab 06/17/15 0527  AST 20  ALT 27  ALKPHOS 79  BILITOT 0.3  PROT 5.4*  ALBUMIN 1.9*   No results for input(s): LIPASE, AMYLASE in the last 168 hours. No results for input(s): AMMONIA in the last 168 hours. CBC:  Recent Labs Lab 06/20/15 1315 06/20/15 1940 06/21/15 0842 06/22/15 0230 06/23/15 0850  WBC 22.2* 18.0* 24.9* 18.9* 20.0*  NEUTROABS  --  14.8* 23.9*  --   --   HGB 8.2* 8.1* 8.4* 7.6* 7.8*  HCT 24.6* 24.5* 25.1* 22.7* 22.9*  MCV 92.8 92.5 92.3 92.3 92.0  PLT 301 335 301 340 321   Cardiac Enzymes: No results for input(s): CKTOTAL, CKMB, CKMBINDEX, TROPONINI in the last 168 hours. BNP (last 3 results) No results for input(s): BNP in the last 8760 hours.  ProBNP (last 3 results) No results for input(s): PROBNP in the last 8760 hours.  CBG: No results for input(s): GLUCAP in the last 168 hours.  Recent Results (from the past 240 hour(s))  Culture, blood (routine x 2)     Status: None   Collection Time: 06/15/15  1:00 PM  Result Value Ref Range Status   Specimen Description BLOOD RIGHT ARM  Final   Special Requests  BOTTLES DRAWN AEROBIC AND ANAEROBIC 5 CC  Final   Culture  Setup Time   Final    GRAM NEGATIVE RODS IN BOTH AEROBIC AND ANAEROBIC BOTTLES CRITICAL RESULT CALLED TO, READ BACK BY AND VERIFIED WITH: L. HITT RN [phone] [place]     Culture ENTEROBACTER CLOACAE  Final   Report Status 06/20/2015 FINAL  Final   Organism ID, Bacteria ENTEROBACTER CLOACAE  Final      Susceptibility   Enterobacter cloacae - MIC*    CEFAZOLIN 8 RESISTANT Resistant     CEFEPIME <=1 SENSITIVE Sensitive     CEFTAZIDIME <=1 SENSITIVE Sensitive     CEFTRIAXONE <=1 SENSITIVE Sensitive     CIPROFLOXACIN <=0.25 SENSITIVE Sensitive     GENTAMICIN <=1 SENSITIVE Sensitive     IMIPENEM <=0.25 SENSITIVE Sensitive     TRIMETH/SULFA <=20 SENSITIVE Sensitive     PIP/TAZO <=4 SENSITIVE Sensitive     * ENTEROBACTER CLOACAE  Culture, blood (routine x 2)     Status: None   Collection Time: 06/15/15  3:09  PM  Result Value Ref Range Status   Specimen Description BLOOD RIGHT HAND  Final   Special Requests BOTTLES DRAWN AEROBIC ONLY 10CC  Final   Culture  Setup Time   Final    GRAM NEGATIVE RODS AEROBIC BOTTLE ONLY CRITICAL RESULT CALLED TO, READ BACK BY AND VERIFIED WITH: L. HITT RN [phone] GREEN R CONFIRMED BY M. CAMPBELL     Culture   Final    ENTEROBACTER CLOACAE SUSCEPTIBILITIES PERFORMED ON PREVIOUS CULTURE WITHIN THE LAST 5 DAYS.    Report Status 06/20/2015 FINAL  Final  Urine culture     Status: None   Collection Time: 06/15/15  4:10 PM  Result Value Ref Range Status   Specimen Description URINE, RANDOM  Final   Special Requests NONE  Final   Culture >=100,000 COLONIES/mL ENTEROBACTER CLOACAE  Final   Report Status 06/18/2015 FINAL  Final   Organism ID, Bacteria ENTEROBACTER CLOACAE  Final      Susceptibility   Enterobacter cloacae - MIC*    CEFAZOLIN <=4 RESISTANT Resistant     CEFTRIAXONE <=1 SENSITIVE Sensitive     CIPROFLOXACIN <=0.25 SENSITIVE Sensitive     GENTAMICIN <=1  SENSITIVE Sensitive     IMIPENEM <=0.25 SENSITIVE Sensitive     NITROFURANTOIN <=16 SENSITIVE Sensitive     TRIMETH/SULFA <=20 SENSITIVE Sensitive     PIP/TAZO <=4 SENSITIVE Sensitive     * >=100,000 COLONIES/mL ENTEROBACTER CLOACAE  MRSA PCR Screening     Status: None   Collection Time: 06/15/15  9:59 PM  Result Value Ref Range Status   MRSA by PCR NEGATIVE NEGATIVE Final    Comment:        The GeneXpert MRSA Assay (FDA approved for NASAL specimens only), is one component of a comprehensive MRSA colonization surveillance program. It is not intended to diagnose MRSA infection nor to guide or monitor treatment for MRSA infections.   C difficile quick scan w PCR reflex     Status: None   Collection Time: 06/16/15 10:54 AM  Result Value Ref Range Status   C Diff antigen NEGATIVE NEGATIVE Final   C Diff toxin NEGATIVE NEGATIVE Final   C Diff interpretation Negative for toxigenic C. difficile  Final  Culture, blood (Routine X 2) w Reflex to ID Panel     Status: None (Preliminary result)   Collection Time: 06/18/15  2:00 PM  Result Value Ref Range Status   Specimen Description BLOOD RIGHT HAND  Final   Special Requests BOTTLES DRAWN AEROBIC AND ANAEROBIC 10CC  Final   Culture NO GROWTH 4 DAYS  Final   Report Status PENDING  Incomplete  Culture, blood (Routine X 2) w Reflex to ID Panel     Status: None (Preliminary result)   Collection Time: 06/18/15  2:15 PM  Result Value Ref Range Status   Specimen Description BLOOD LEFT ANTECUBITAL  Final   Special Requests BOTTLES DRAWN AEROBIC AND ANAEROBIC 10CC  Final   Culture NO GROWTH 4 DAYS  Final   Report Status PENDING  Incomplete  Gastrointestinal Panel by PCR , Stool     Status: None   Collection Time: 06/22/15  5:41 AM  Result Value Ref Range Status   Campylobacter species NOT DETECTED NOT DETECTED Final   Plesimonas shigelloides NOT DETECTED NOT DETECTED Final   Salmonella species NOT DETECTED NOT DETECTED Final   Yersinia  enterocolitica NOT DETECTED NOT DETECTED Final   Vibrio species NOT DETECTED NOT DETECTED Final   Vibrio cholerae  NOT DETECTED NOT DETECTED Final   Enteroaggregative E coli (EAEC) NOT DETECTED NOT DETECTED Final   Enteropathogenic E coli (EPEC) NOT DETECTED NOT DETECTED Final   Enterotoxigenic E coli (ETEC) NOT DETECTED NOT DETECTED Final   Shiga like toxin producing E coli (STEC) NOT DETECTED NOT DETECTED Final   E. coli O157 NOT DETECTED NOT DETECTED Final   Shigella/Enteroinvasive E coli (EIEC) NOT DETECTED NOT DETECTED Final   Cryptosporidium NOT DETECTED NOT DETECTED Final   Cyclospora cayetanensis NOT DETECTED NOT DETECTED Final   Entamoeba histolytica NOT DETECTED NOT DETECTED Final   Giardia lamblia NOT DETECTED NOT DETECTED Final   Adenovirus F40/41 NOT DETECTED NOT DETECTED Final   Astrovirus NOT DETECTED NOT DETECTED Final   Norovirus GI/GII NOT DETECTED NOT DETECTED Final   Rotavirus A NOT DETECTED NOT DETECTED Final   Sapovirus (I, II, IV, and V) NOT DETECTED NOT DETECTED Final     Studies:  Recent x-ray studies have been reviewed in detail by the Attending Physician  Scheduled Meds:  Scheduled Meds: . cefTRIAXone (ROCEPHIN)  IV  2 g Intravenous Q24H  . sodium chloride  3 mL Intravenous Q12H    Time spent on care of this patient: 20 mins   Debbe Odea , MD  [facility] Office  [phone] Pager - [phone]  On-Call/Text Page:      [email or web address]      password [email or web address]  If 7PM-7AM, please contact night-coverage [email or web address] Password [email or web address] 06/23/2015, 12:45 PM   LOS: 8 days

## 2015-06-24 NOTE — Interval H&P Note (Signed)
History and Physical Interval Note:  06/24/2015 9:53 AM  Becky Patterson  has presented today for surgery, with the diagnosis of SPLENIC INF  The various methods of treatment have been discussed with the patient and family. After consideration of risks, benefits and other options for treatment, the patient has consented to  Procedure(s): TRANSESOPHAGEAL ECHOCARDIOGRAM (TEE) (N/A) as a surgical intervention .  The patient's history has been reviewed, patient examined, no change in status, stable for surgery.  I have reviewed the patient's chart and labs.  Questions were answered to the patient's satisfaction.     Seena Face [employer]

## 2015-06-24 NOTE — Progress Notes (Signed)
[place] for Infectious Disease   Reason for visit: Follow up on Enterococcal endocarditis  Interval History: repeat blood cultures negative, TEE confirmed mitral valve vegetation on both leaflets but no signficant valve dysfuntion.  Wants to go home. Some fever and rigors.    Physical Exam: Constitutional:  Filed Vitals:   06/24/15 1030 06/24/15 1040  BP:  105/46  Pulse: 89 88  Temp:    Resp: 20 17   patient appears in NAD Respiratory: Normal respiratory effort; CTA B Cardiovascular: RRR  Review of Systems: Constitutional: negative for malaise and anorexia Cardiovascular: negative for chest pain Gastrointestinal: negative for nausea and diarrhea  Lab Results  Component Value Date   WBC 18.9* 06/24/2015   HGB 7.9* 06/24/2015   HCT 23.2* 06/24/2015   MCV 92.4 06/24/2015   PLT 360 06/24/2015    Lab Results  Component Value Date   CREATININE 0.93 06/24/2015   BUN 6 06/24/2015   NA 134* 06/24/2015   K 4.2 06/24/2015   CL 98* 06/24/2015   CO2 25 06/24/2015    Lab Results  Component Value Date   ALT 27 06/17/2015   AST 20 06/17/2015   ALKPHOS 79 06/17/2015     Microbiology: Recent Results (from the past 240 hour(s))  Culture, blood (routine x 2)     Status: None   Collection Time: 06/15/15  1:00 PM  Result Value Ref Range Status   Specimen Description BLOOD RIGHT ARM  Final   Special Requests BOTTLES DRAWN AEROBIC AND ANAEROBIC 5 CC  Final   Culture  Setup Time   Final    GRAM NEGATIVE RODS IN BOTH AEROBIC AND ANAEROBIC BOTTLES CRITICAL RESULT CALLED TO, READ BACK BY AND VERIFIED WITH: L. HITT RN [id number] [phone] GREEN R CONFIRMED BY [place]     Culture ENTEROBACTER CLOACAE  Final   Report Status 06/20/2015 FINAL  Final   Organism ID, Bacteria ENTEROBACTER CLOACAE  Final      Susceptibility   Enterobacter cloacae - MIC*    CEFAZOLIN 8 RESISTANT Resistant     CEFEPIME <=1 SENSITIVE Sensitive     CEFTAZIDIME <=1 SENSITIVE Sensitive    CEFTRIAXONE <=1 SENSITIVE Sensitive     CIPROFLOXACIN <=0.25 SENSITIVE Sensitive     GENTAMICIN <=1 SENSITIVE Sensitive     IMIPENEM <=0.25 SENSITIVE Sensitive     TRIMETH/SULFA <=20 SENSITIVE Sensitive     PIP/TAZO <=4 SENSITIVE Sensitive     * ENTEROBACTER CLOACAE  Culture, blood (routine x 2)     Status: None   Collection Time: 06/15/15  3:09 PM  Result Value Ref Range Status   Specimen Description BLOOD RIGHT HAND  Final   Special Requests BOTTLES DRAWN AEROBIC ONLY 10CC  Final   Culture  Setup Time   Final    GRAM NEGATIVE RODS AEROBIC BOTTLE ONLY CRITICAL RESULT CALLED TO, READ BACK BY AND VERIFIED WITH: L. HITT RN [phone] GREEN R CONFIRMED BY M. CAMPBELL     Culture   Final    ENTEROBACTER CLOACAE SUSCEPTIBILITIES PERFORMED ON PREVIOUS CULTURE WITHIN THE LAST 5 DAYS.    Report Status 06/20/2015 FINAL  Final  Urine culture     Status: None   Collection Time: 06/15/15  4:10 PM  Result Value Ref Range Status   Specimen Description URINE, RANDOM  Final   Special Requests NONE  Final   Culture >=100,000 COLONIES/mL ENTEROBACTER CLOACAE  Final   Report Status 06/18/2015 FINAL  Final   Organism ID,  Bacteria ENTEROBACTER CLOACAE  Final      Susceptibility   Enterobacter cloacae - MIC*    CEFAZOLIN <=4 RESISTANT Resistant     CEFTRIAXONE <=1 SENSITIVE Sensitive     CIPROFLOXACIN <=0.25 SENSITIVE Sensitive     GENTAMICIN <=1 SENSITIVE Sensitive     IMIPENEM <=0.25 SENSITIVE Sensitive     NITROFURANTOIN <=16 SENSITIVE Sensitive     TRIMETH/SULFA <=20 SENSITIVE Sensitive     PIP/TAZO <=4 SENSITIVE Sensitive     * >=100,000 COLONIES/mL ENTEROBACTER CLOACAE  MRSA PCR Screening     Status: None   Collection Time: 06/15/15  9:59 PM  Result Value Ref Range Status   MRSA by PCR NEGATIVE NEGATIVE Final    Comment:        The GeneXpert MRSA Assay (FDA approved for NASAL specimens only), is one component of a comprehensive MRSA colonization surveillance program. It is  not intended to diagnose MRSA infection nor to guide or monitor treatment for MRSA infections.   C difficile quick scan w PCR reflex     Status: None   Collection Time: 06/16/15 10:54 AM  Result Value Ref Range Status   C Diff antigen NEGATIVE NEGATIVE Final   C Diff toxin NEGATIVE NEGATIVE Final   C Diff interpretation Negative for toxigenic C. difficile  Final  Culture, blood (Routine X 2) w Reflex to ID Panel     Status: None   Collection Time: 06/18/15  2:00 PM  Result Value Ref Range Status   Specimen Description BLOOD RIGHT HAND  Final   Special Requests BOTTLES DRAWN AEROBIC AND ANAEROBIC 10CC  Final   Culture NO GROWTH 5 DAYS  Final   Report Status 06/23/2015 FINAL  Final  Culture, blood (Routine X 2) w Reflex to ID Panel     Status: None   Collection Time: 06/18/15  2:15 PM  Result Value Ref Range Status   Specimen Description BLOOD LEFT ANTECUBITAL  Final   Special Requests BOTTLES DRAWN AEROBIC AND ANAEROBIC 10CC  Final   Culture NO GROWTH 5 DAYS  Final   Report Status 06/23/2015 FINAL  Final  Gastrointestinal Panel by PCR , Stool     Status: None   Collection Time: 06/22/15  5:41 AM  Result Value Ref Range Status   Campylobacter species NOT DETECTED NOT DETECTED Final   Plesimonas shigelloides NOT DETECTED NOT DETECTED Final   Salmonella species NOT DETECTED NOT DETECTED Final   Yersinia enterocolitica NOT DETECTED NOT DETECTED Final   Vibrio species NOT DETECTED NOT DETECTED Final   Vibrio cholerae NOT DETECTED NOT DETECTED Final   Enteroaggregative E coli (EAEC) NOT DETECTED NOT DETECTED Final   Enteropathogenic E coli (EPEC) NOT DETECTED NOT DETECTED Final   Enterotoxigenic E coli (ETEC) NOT DETECTED NOT DETECTED Final   Shiga like toxin producing E coli (STEC) NOT DETECTED NOT DETECTED Final   E. coli O157 NOT DETECTED NOT DETECTED Final   Shigella/Enteroinvasive E coli (EIEC) NOT DETECTED NOT DETECTED Final   Cryptosporidium NOT DETECTED NOT DETECTED Final    Cyclospora cayetanensis NOT DETECTED NOT DETECTED Final   Entamoeba histolytica NOT DETECTED NOT DETECTED Final   Giardia lamblia NOT DETECTED NOT DETECTED Final   Adenovirus F40/41 NOT DETECTED NOT DETECTED Final   Astrovirus NOT DETECTED NOT DETECTED Final   Norovirus GI/GII NOT DETECTED NOT DETECTED Final   Rotavirus A NOT DETECTED NOT DETECTED Final   Sapovirus (I, II, IV, and V) NOT DETECTED NOT DETECTED Final  Impression/Plan:  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  Complicated by splenic infarcts, presumably septic. Sensitivities noted and is sensitive to fluoroquinolones.   Therefore, she should get oral ciprofloxacin 750 mg twice a day for 6 weeks through March 7th (5 more weeks) We will arrange follow up in our clinic in 2-3 weeks Will need cardiology follow up to monitor vegetation  2. Splenic infarcts - likely from #1.

## 2015-06-24 NOTE — Progress Notes (Signed)
Echocardiogram Echocardiogram Transesophageal has been performed.  Becky Patterson 06/24/2015, 11:03 AM

## 2015-06-24 NOTE — Progress Notes (Signed)
ANTICOAGULATION CONSULT NOTE - Follow Up Consult  Pharmacy Consult for heparin Indication: splenic infarcts   Labs:  Recent Labs  06/21/15 0842 06/22/15 0230  06/23/15 0850 06/23/15 1711 06/24/15 0133  HGB 8.4* 7.6*  --  7.8*  --   --   HCT 25.1* 22.7*  --  22.9*  --   --   PLT 301 340  --  321  --   --   HEPARINUNFRC  --  <0.10*  < > <0.10* 0.11* 0.23*  CREATININE  --   --   --  0.95  --   --   < > = values in this interval not displayed.   Assessment: 60yo female remains subtherapeutic on heparin despite high rate though getting closer to goal; RN reports no gtt issues.  Goal of Therapy:  Heparin level 0.3-0.7 units/ml   Plan:  Will increase heparin gtt by ~10% to 2900 units/hr and check level in 6hr.  Wynona Neat, PharmD, BCPS  06/24/2015,2:02 AM

## 2015-06-24 NOTE — CV Procedure (Addendum)
Procedure: TEE  Indication: Endocarditis  Sedation: Versed 4 mg IV, Fentanyl 50 mcg IV  Findings:  Please see echo section for full report.  Normal LV size with EF 60-65%. Normal wall motion.  Normal right ventricular size and systolic function.  Normal right atrial size with prominent Eustachian ridge.  Normal left atrial size with no LA appendage thrombus.  No evidence by color doppler for PFO or ASD.  Trivial TR with no vegetation on tricuspid valve.  Trivial PI with no vegetation on tricuspid valve.  Trileaflet aortic valve with no aortic insufficiency or stenosis.  No vegetation on aortic valve.  The mitral valve had mobile, multilobed vegetation involving both the anterior and posterior leaflets.  1.5 x 0.7 cm dimension with valve closed.  There was only trivial mitral regurgitation.  Normal caliber aorta with minimal plaque.   Impression: Mitral valve endocarditis, vegetation involves both leaflets but with trivial MR, does not seem to be causing significant destruction of valve at this point.   Becky Patterson 06/24/2015 10:20 AM

## 2015-06-24 NOTE — Progress Notes (Addendum)
[place] TEAM 1 - Stepdown/ICU TEAM Progress Note  Becky Patterson BOF:[id number] DOB: [birth year] DOA: 06/15/2015 PCP: No primary care provider on file.  Admit HPI / Brief Narrative: 59 y.o. WF PMHx Tobacco Abuse   Presented with fever and cough. 2 or 3 weeks ago she started to develop malaise and diarrhea, with occasional emesis and decreased appetite. The day of her admission she developed fever and shakes, so her family made her come to the ER.  In the ED, the patient had a temperature to 102.27F, HR 127 bpm, tachypnea, and low blood pressure. Lactic acid level was initially 3. She had leukocytosis and a chest x-ray that showed a left lower lobe opacity. She subsequently was found to have an enterococcal UTI and bacteremia. Repeat blood cultures were negative but fever and leukocytosis did not improve. Further work up done with CT scans revealed splenic infarcts. 2 ECHO subsequently revealed mitral vegetation which was confirmed by TEE which was done today. Have ordered a PICC line to prepare for long term home antibiotic therapy. Expect ID will f/u as outpt. Despite being on appropriate therapy, she continues to have fever and ongoing leukocytosis.   Of note, the patient has had increased low back pain for a week, and is felt to have possibly lost 20 pounds in the last several weeks of this illness. She has never had a colonoscopy, but states she has had a mammogram. Because of the nodular appearance of her left lower lobe opacity, CT was obtained which showed a well-circumscribed nodule 2 cm in diameter in the left lower lobe. In addition radiographs of the low back showed what appeared to be sclerotic lesions of the pelvis.   HPI/S ubjective: Pain in left inner thigh when ambulating- no other new complaints. No dyspnea, cough, nausea, vomiting, abdominal pain or diarrhea.   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 If we need to add or change coverage- will allow ID to make this decision-  will need long term antibiotics- ID recommending Oral CIpro  Splenic infarcts - CT abd/pelvis performed as fevers were nto resolve ing- this showed new splenic infarcts -hematology consulted- as we could not rule out an intracardiac thrombus,  Heparin started by hematology - TEE confirms mitral valve vegetatios and no intracardiac thrombus- will stop Heparin.   Acute hypoxia -stat CTA performed last night reveals only atelectasis - start IS- ambulate  Left thigh pain - started yesterday and is limiting her mobility - start Voltaren gel as is appears to be a muscle sprain- follow for improvement  Hypokalemia - replace and recheck in AM  1.8cm superior segment LLL Lung mass  -metastasis of unknown primary vs hamartoma vs primary lung CA - Radiology recommends PET/CT as outpt rather than a bx at this time - CT scan head negative for metastasis  Tobacco Abuse   -Patient continues to smoke. -Counseled patient on the absolute need to discontinue smoking completely and permanently  Anemia of chronic disease with Iron deficiency - anemia panel reveals Iron levels are low- Ferretin level may be falsely elevated in setting of infection- start oral Iron treatment - minor drop noted after being started on Heparin- no  signs of bleeding- may be anemia of acute inflammation -  follow hemoglobin trend - check stool occults  Hyperglycemia - no prior history of DM  -Hemoglobin A1c 6.9-  diabetes teaching- nutrition teaching - follow sugars TID and give insulin as needed - will hold off on starting hypoglycemics and  see if she can improve her sugars with diet control and weight loss  Hypomagnesemia - replaced  Hypokalemia - improved with replacement   Code Status: FULL Family Communication: husband at bedside Disposition Plan: home in 1-2 days   Consultants: IR  Procedure/Significant Events: 1/22 CT chest WO contrast;- circumscribed nodule superior segment left lower lobe measuring 1.8 cm diameter. Suspicious for malignancy, 1/23 CT abdomen pelvis with contrast;Findings consistent with pyelonephritis which appears Lt>>>Rt.-Negative for colitis. -S/P cholecystectomy. -Diverticulosis without diverticulitis.   Antibiotics: Azithro 1/23 1 dose Ceftriaxone 1/23 >>> Zosyn 1/22 1 dose Vanc 1/22 2 doses  DVT prophylaxis: Lovenox   Devices NA   LINES / TUBES:  NA    Continuous Infusions: . heparin 2,900 Units/hr (06/24/15 1028)    Objective: VITAL SIGNS: Temp: 99.1 F (37.3 C) (01/31 1027) Temp Source: Oral (01/31 1027) BP: 105/46 mmHg (01/31 1040) Pulse Rate: 88 (01/31 1040) SPO2; FIO2:   Intake/Output Summary (Last 24 hours) at 06/24/15 1113 Last data filed at 06/23/15 1930  Gross per 24 hour  Intake    220 ml  Output      0 ml  Net    220 ml     Exam: General:A/O 4, NAD,, No acute respiratory distress Eyes: Negative headache, negative scleral hemorrhage ENT: Negative Runny nose, negative gingival bleeding, Neck:  Negative scars, masses, torticollis, lymphadenopathy, JVD Lungs: Clear to auscultation bilaterally without wheezes or crackles Cardiovascular: Regular rate and rhythm without murmur gallop or rub normal S1 and S2 Abdomen: nondistended, positive soft, bowel sounds, no rebound, no ascites, no appreciable mass- mild left upper abdominal tenderness  Extremities: No significant cyanosis, clubbing, or edema bilateral lower extremities Musculoskeletal: pain/ tenderness in left upper thigh Psychiatric:  Negative depression, negative anxiety, negative  fatigue, negative mania  Neurologic:  Cranial nerves II through XII intact, tongue/uvula midline, all extremities muscle strength 5/5, sensation intact throughout, negative dysarthria, negative expressive aphasia, negative receptive aphasia.   Data Reviewed: Basic Metabolic Panel:  Recent Labs Lab 06/17/15 1315 06/19/15 0426 06/23/15 0850 06/24/15 0545  NA  --  134* 133* 134*  K 3.5 3.5 3.2* 4.2  CL  --  100* 97* 98*  CO2  --  '22 24 25  '$ GLUCOSE  --  150* 236* 133*  BUN  --  <5* 7 6  CREATININE  --  0.97 0.95 0.93  CALCIUM  --  8.1* 8.2* 8.4*  MG 1.4* 1.9  --   --    Liver Function Tests: No results for input(s): AST, ALT, ALKPHOS, BILITOT, PROT, ALBUMIN in the last 168 hours. No results for input(s): LIPASE, AMYLASE in the last 168 hours. No results for input(s): AMMONIA in the last 168 hours. CBC:  Recent Labs Lab 06/20/15 1940 06/21/15 0842 06/22/15 0230 06/23/15 0850 06/24/15 0545  WBC 18.0* 24.9* 18.9* 20.0* 18.9*  NEUTROABS 14.8* 23.9*  --   --   --   HGB 8.1* 8.4* 7.6* 7.8* 7.9*  HCT 24.5* 25.1* 22.7* 22.9* 23.2*  MCV 92.5 92.3 92.3 92.0 92.4  PLT 335 301 340 321 360   Cardiac Enzymes: No results for input(s): CKTOTAL, CKMB, CKMBINDEX, TROPONINI in the last 168 hours. BNP (last 3  results) No results for input(s): BNP in the last 8760 hours.  ProBNP (last 3 results) No results for input(s): PROBNP in the last 8760 hours.  CBG: No results for input(s): GLUCAP in the last 168 hours.  Recent Results (from the past 240 hour(s))  Culture, blood (routine x 2)     Status: None   Collection Time: 06/15/15  1:00 PM  Result Value Ref Range Status   Specimen Description BLOOD RIGHT ARM  Final   Special Requests BOTTLES DRAWN AEROBIC AND ANAEROBIC 5 CC  Final   Culture  Setup Time   Final    GRAM NEGATIVE RODS IN BOTH AEROBIC AND ANAEROBIC BOTTLES CRITICAL RESULT CALLED TO, READ BACK BY AND VERIFIED WITH: L. HITT RN [id number] [phone] GREEN R CONFIRMED BY Perry      Culture ENTEROBACTER CLOACAE  Final   Report Status 06/20/2015 FINAL  Final   Organism ID, Bacteria ENTEROBACTER CLOACAE  Final      Susceptibility   Enterobacter cloacae - MIC*    CEFAZOLIN 8 RESISTANT Resistant     CEFEPIME <=1 SENSITIVE Sensitive     CEFTAZIDIME <=1 SENSITIVE Sensitive     CEFTRIAXONE <=1 SENSITIVE Sensitive     CIPROFLOXACIN <=0.25 SENSITIVE Sensitive     GENTAMICIN <=1 SENSITIVE Sensitive     IMIPENEM <=0.25 SENSITIVE Sensitive     TRIMETH/SULFA <=20 SENSITIVE Sensitive     PIP/TAZO <=4 SENSITIVE Sensitive     * ENTEROBACTER CLOACAE  Culture, blood (routine x 2)     Status: None   Collection Time: 06/15/15  3:09 PM  Result Value Ref Range Status   Specimen Description BLOOD RIGHT HAND  Final   Special Requests BOTTLES DRAWN AEROBIC ONLY 10CC  Final   Culture  Setup Time   Final    GRAM NEGATIVE RODS AEROBIC BOTTLE ONLY CRITICAL RESULT CALLED TO, READ BACK BY AND VERIFIED WITH: L. HITT RN [phone] GREEN R CONFIRMED BY M. CAMPBELL     Culture   Final    ENTEROBACTER CLOACAE SUSCEPTIBILITIES PERFORMED ON PREVIOUS CULTURE WITHIN THE LAST 5 DAYS.    Report Status 06/20/2015 FINAL  Final  Urine culture     Status: None   Collection Time: 06/15/15  4:10 PM  Result Value Ref Range Status   Specimen Description URINE, RANDOM  Final   Special Requests NONE  Final   Culture >=100,000 COLONIES/mL ENTEROBACTER CLOACAE  Final   Report Status 06/18/2015 FINAL  Final   Organism ID, Bacteria ENTEROBACTER CLOACAE  Final      Susceptibility   Enterobacter cloacae - MIC*    CEFAZOLIN <=4 RESISTANT Resistant     CEFTRIAXONE <=1 SENSITIVE Sensitive     CIPROFLOXACIN <=0.25 SENSITIVE Sensitive     GENTAMICIN <=1 SENSITIVE Sensitive     IMIPENEM <=0.25 SENSITIVE Sensitive     NITROFURANTOIN <=16 SENSITIVE Sensitive     TRIMETH/SULFA <=20 SENSITIVE Sensitive     PIP/TAZO <=4 SENSITIVE Sensitive     * >=100,000 COLONIES/mL ENTEROBACTER CLOACAE  MRSA PCR  Screening     Status: None   Collection Time: 06/15/15  9:59 PM  Result Value Ref Range Status   MRSA by PCR NEGATIVE NEGATIVE Final    Comment:        The GeneXpert MRSA Assay (FDA approved for NASAL specimens only), is one component of a comprehensive MRSA colonization surveillance program. It is not intended to diagnose MRSA infection nor to guide or monitor treatment for MRSA infections.  C difficile quick scan w PCR reflex     Status: None   Collection Time: 06/16/15 10:54 AM  Result Value Ref Range Status   C Diff antigen NEGATIVE NEGATIVE Final   C Diff toxin NEGATIVE NEGATIVE Final   C Diff interpretation Negative for toxigenic C. difficile  Final  Culture, blood (Routine X 2) w Reflex to ID Panel     Status: None   Collection Time: 06/18/15  2:00 PM  Result Value Ref Range Status   Specimen Description BLOOD RIGHT HAND  Final   Special Requests BOTTLES DRAWN AEROBIC AND ANAEROBIC 10CC  Final   Culture NO GROWTH 5 DAYS  Final   Report Status 06/23/2015 FINAL  Final  Culture, blood (Routine X 2) w Reflex to ID Panel     Status: None   Collection Time: 06/18/15  2:15 PM  Result Value Ref Range Status   Specimen Description BLOOD LEFT ANTECUBITAL  Final   Special Requests BOTTLES DRAWN AEROBIC AND ANAEROBIC 10CC  Final   Culture NO GROWTH 5 DAYS  Final   Report Status 06/23/2015 FINAL  Final  Gastrointestinal Panel by PCR , Stool     Status: None   Collection Time: 06/22/15  5:41 AM  Result Value Ref Range Status   Campylobacter species NOT DETECTED NOT DETECTED Final   Plesimonas shigelloides NOT DETECTED NOT DETECTED Final   Salmonella species NOT DETECTED NOT DETECTED Final   Yersinia enterocolitica NOT DETECTED NOT DETECTED Final   Vibrio species NOT DETECTED NOT DETECTED Final   Vibrio cholerae NOT DETECTED NOT DETECTED Final   Enteroaggregative E coli (EAEC) NOT DETECTED NOT DETECTED Final   Enteropathogenic E coli (EPEC) NOT DETECTED NOT DETECTED Final    Enterotoxigenic E coli (ETEC) NOT DETECTED NOT DETECTED Final   Shiga like toxin producing E coli (STEC) NOT DETECTED NOT DETECTED Final   E. coli O157 NOT DETECTED NOT DETECTED Final   Shigella/Enteroinvasive E coli (EIEC) NOT DETECTED NOT DETECTED Final   Cryptosporidium NOT DETECTED NOT DETECTED Final   Cyclospora cayetanensis NOT DETECTED NOT DETECTED Final   Entamoeba histolytica NOT DETECTED NOT DETECTED Final   Giardia lamblia NOT DETECTED NOT DETECTED Final   Adenovirus F40/41 NOT DETECTED NOT DETECTED Final   Astrovirus NOT DETECTED NOT DETECTED Final   Norovirus GI/GII NOT DETECTED NOT DETECTED Final   Rotavirus A NOT DETECTED NOT DETECTED Final   Sapovirus (I, II, IV, and V) NOT DETECTED NOT DETECTED Final     Studies:  Recent x-ray studies have been reviewed in detail by the Attending Physician  Scheduled Meds:  Scheduled Meds: . cefTRIAXone (ROCEPHIN)  IV  2 g Intravenous Q24H  . diclofenac sodium  2 g Topical QID  . sodium chloride  3 mL Intravenous Q12H    Time spent on care of this patient: 30 mins   [facility] , MD  [facility] Office  [phone] Pager - [phone]  On-Call/Text Page:      [email or web address]      password [email or web address]  If 7PM-7AM, please contact night-coverage [email or web address] Password [email or web address] 06/24/2015, 11:13 AM   LOS: 9 days

## 2015-06-24 NOTE — Consult Note (Signed)
[place] for Heparin Indication: splenic infarcts  Allergies  Allergen Reactions  . Codeine Nausea And Vomiting    Patient Measurements: Height: '5\' 2"'$  (157.5 cm) Weight: 202 lb (91.627 kg) IBW/kg (Calculated) : 50.1 Heparin Dosing Weight: ~71kg  Vital Signs: Temp: 99.4 F (37.4 C) (01/31 0925) Temp Source: Oral (01/31 0925) BP: 113/59 mmHg (01/31 0925) Pulse Rate: 90 (01/31 0925)  Labs:  Recent Labs  06/22/15 0230  06/23/15 0850 06/23/15 1711 06/24/15 0133 06/24/15 0545 06/24/15 0901  HGB 7.6*  --  7.8*  --   --  7.9*  --   HCT 22.7*  --  22.9*  --   --  23.2*  --   PLT 340  --  321  --   --  360  --   HEPARINUNFRC <0.10*  < > <0.10* 0.11* 0.23*  --  0.35  CREATININE  --   --  0.95  --   --  0.93  --   < > = values in this interval not displayed.  Estimated Creatinine Clearance: 66.9 mL/min (by C-G formula based on Cr of 0.93).   Medical History: History reviewed. No pertinent past medical history.  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 Eventual transition will be to [place].   HL remained low with multiple increases in rate. Last HL is now therapeutic at 0.35 on 2,900 units/hr. Hgb low but stable at 7.9. Plts wnl. No s/s of bleed. Went down to endoscopy for TEE today to r/o endocarditis.  Goal of Therapy:  Heparin level 0.3-0.7 units/ml Monitor platelets by anticoagulation protocol: Yes   Plan:  Continue heparin gtt at 2,900 units/hr Monitor daily HL, CBC, s/s of bleed Check 6 hr cHL  Elenor Quinones, PharmD, [facility] Clinical Pharmacist Pager [phone] 06/24/2015 9:53 AM

## 2015-06-24 NOTE — Progress Notes (Signed)
Brief RD Nutrition Education Follow-Up Note  RD received another consult for diet education (carb modified diet and weight loss).   Lab Results  Component Value Date   HGBA1C 6.9* 06/17/2015   Pt educated on carb modified diet by this RD on 06/20/15. See education note for further details.  Reviewed DM coordinator note from 06/23/15, who is also following pt.   Spoke with pt and husband at bedside. Pt and husband report no further questions on information discussed on 06/20/15. Pt reports she is eating and feeling better, which husband confirms. Noted strawberry [employer] milk shake at bedside. Pt resistant to make lifestyle changes, but was willing to decrease the amount of sweetened beverages she consumes. Per MD note, plan to initially try lifestyle interventions and outpatient follow-up with MD prior to initiating oral hypoglycemic agents.   Both pt and husband express appreciation for visit, but deny further nutritional needs or concerns at this time.   Becky Patterson A. Jimmye Norman, RD, LDN, CDE Pager: [phone] After hours Pager: [phone]

## 2015-06-25 ENCOUNTER — Encounter (HOSPITAL_COMMUNITY): Payer: Self-pay | Admitting: Cardiology

## 2015-06-25 ENCOUNTER — Telehealth: Payer: Self-pay | Admitting: Oncology

## 2015-06-25 ENCOUNTER — Other Ambulatory Visit: Payer: Self-pay | Admitting: Oncology

## 2015-06-25 DIAGNOSIS — C7951 Secondary malignant neoplasm of bone: Principal | ICD-10-CM

## 2015-06-25 DIAGNOSIS — R7881 Bacteremia: Secondary | ICD-10-CM

## 2015-06-25 DIAGNOSIS — C3490 Malignant neoplasm of unspecified part of unspecified bronchus or lung: Secondary | ICD-10-CM

## 2015-06-25 LAB — GLUCOSE, CAPILLARY
GLUCOSE-CAPILLARY: 94 mg/dL (ref 65–99)
GLUCOSE-CAPILLARY: 179 mg/dL — AB (ref 65–99)
Glucose-Capillary: 142 mg/dL — ABNORMAL HIGH (ref 65–99)
Glucose-Capillary: 94 mg/dL (ref 65–99)

## 2015-06-25 LAB — CBC
HCT: 23.2 % — ABNORMAL LOW (ref 36.0–46.0)
HEMOGLOBIN: 7.8 g/dL — AB (ref 12.0–15.0)
MCH: 31 pg (ref 26.0–34.0)
MCHC: 33.6 g/dL (ref 30.0–36.0)
MCV: 92.1 fL (ref 78.0–100.0)
Platelets: 348 10*3/uL (ref 150–400)
RBC: 2.52 MIL/uL — AB (ref 3.87–5.11)
RDW: 13.9 % (ref 11.5–15.5)
WBC: 14.8 10*3/uL — ABNORMAL HIGH (ref 4.0–10.5)

## 2015-06-25 LAB — LUPUS ANTICOAGULANT PANEL
DRVVT: 44.6 s — ABNORMAL HIGH (ref 0.0–44.0)
PTT Lupus Anticoagulant: 47.8 s — ABNORMAL HIGH (ref 0.0–40.6)

## 2015-06-25 LAB — DRVVT MIX: dRVVT Mix: 39.2 s (ref 0.0–44.0)

## 2015-06-25 LAB — PTT-LA MIX: PTT-LA MIX: 48.4 s — AB (ref 0.0–40.6)

## 2015-06-25 LAB — HEXAGONAL PHASE PHOSPHOLIPID: HEXAGONAL PHASE PHOSPHOLIPID: 5 s (ref 0–11)

## 2015-06-25 MED ORDER — FUROSEMIDE 40 MG PO TABS
40.0000 mg | ORAL_TABLET | Freq: Every day | ORAL | Status: DC
Start: 2015-06-25 — End: 2015-06-26
  Administered 2015-06-25 – 2015-06-26 (×2): 40 mg via ORAL
  Filled 2015-06-25 (×2): qty 1

## 2015-06-25 MED ORDER — CIPROFLOXACIN HCL 500 MG PO TABS
750.0000 mg | ORAL_TABLET | Freq: Two times a day (BID) | ORAL | Status: DC
  Administered 2015-06-25 – 2015-06-26 (×3): 750 mg via ORAL
  Filled 2015-06-25 (×3): qty 1

## 2015-06-25 NOTE — Progress Notes (Signed)
Becky Patterson TEAM 1 - Stepdown/ICU TEAM Progress Note  Becky Patterson XBJ:[id number] DOB: Oct 13, 1955 DOA: 06/15/2015 PCP: No primary care provider on file.  Admit HPI / Brief Narrative: 60 y.o. WF PMHx Tobacco Abuse   Presented with fever and cough. 2 or 3 weeks ago she started to develop malaise and diarrhea, with occasional emesis and decreased appetite. The day of her admission she developed fever and shakes, so her family made her come to the ER.  In the ED, the patient had a temperature to 102.25F, HR 127 bpm, tachypnea, and low blood pressure. Lactic acid level was initially 3. She had leukocytosis and a chest x-ray that showed a left lower lobe opacity. She subsequently was found to have an enterococcal UTI and bacteremia. Repeat blood cultures were negative but fever and leukocytosis did not improve. Further work up done with CT scans revealed splenic infarcts. 2 ECHO subsequently revealed mitral vegetation which was confirmed by TEE which was done today. Have ordered a PICC line to prepare for long term home antibiotic therapy. Expect ID will f/u as outpt. Despite being on appropriate therapy, she continues to have fever and ongoing leukocytosis.   Of note, the patient has had increased low back pain for a week, and is felt to have possibly lost 20 pounds in the last several weeks of this illness. She has never had a colonoscopy, but states she has had a mammogram. Because of the nodular appearance of her left lower lobe opacity, CT was obtained which showed a well-circumscribed nodule 2 cm in diameter in the left lower lobe. In addition radiographs of the low back showed what appeared to be sclerotic lesions of the pelvis.   HPI/S ubjective:  Doing fair. No nausea no vomiting States she's had some diarrhea No chest pain No cough no cold and overall feels somewhat better  Assessment/Plan: Sepsisdue to  Enterococcus UTI/ Pyelonephritis, bacteremia and Endocarditis - leukocytosis  persists- continues to have temps- ID following -Unsure splenic infarcts could be a possible leukocytosis and fever? 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 -repeat blood cultures are negative - continue antibiotics per ID- - ID recommending Oral CIpro -If afebrile over the next 24-48 hours would consider discharge home and symptomatic management as an outpatient  Splenic infarcts - CT abd/pelvis performed as fevers were nto resolve ing- this showed new splenic infarcts  -hematology consulted- as we could not rule out an intracardiac thrombus,  Heparin started by hematology -Lab work as ordered by hematology including DR VVT, PTT-LA mix, hexagon phase phospholipid and CA-19-9 are all within normal range, CEA -Could consider Jak-2 - TEE confirms mitral valve vegetatios and no intracardiac thrombus- will stop Heparin.   Acute hypoxia -stat CTA performed last night reveals only atelectasis - start IS- ambulate  Left thigh pain - started yesterday and is limiting her mobility - start Voltaren gel as is appears to be a muscle sprain- follow for improvement  Hypokalemia - replace and recheck in AM  1.8cm superior segment LLL Lung mass  -metastasis of unknown primary vs hamartoma vs primary lung CA - Radiology recommends PET/CT as outpt rather than a bx at this time - CT scan head negative for metastasis  Tobacco Abuse   -Patient continues to smoke. -Counseled patient on the absolute need to discontinue smoking completely and permanently  Anemia of chronic disease with Iron deficiency - anemia panel  reveals Iron levels are low- Ferretin level may be falsely elevated in setting of infection- start oral Iron treatment - minor drop noted after being started on Heparin- no signs of bleeding- may be anemia of acute inflammation -  follow hemoglobin trend - check stool  occults  Hyperglycemia - no prior history of DM  -Hemoglobin A1c 6.9-  diabetes teaching- nutrition teaching - follow sugars TID and give insulin as needed - will hold off on starting hypoglycemics and see if she can improve her sugars with diet control and weight loss Will need close outpatient monitoring-  Hypomagnesemia - replaced  Hypokalemia - improved with replacement   Code Status: FULL Family Communication: husband at bedside Disposition Plan: home in 1-2 days   Consultants: IR  Procedure/Significant Events: 1/22 CT chest WO contrast;- circumscribed nodule superior segment left lower lobe measuring 1.8 cm diameter. Suspicious for malignancy, 1/23 CT abdomen pelvis with contrast;Findings consistent with pyelonephritis which appears Lt>>>Rt.-Negative for colitis. -S/P cholecystectomy. -Diverticulosis without diverticulitis.   Antibiotics: Azithro 1/23 1 dose Ceftriaxone 1/23 >>> Zosyn 1/22 1 dose Vanc 1/22 2 doses  DVT prophylaxis: Lovenox   Devices NA   LINES / TUBES:  NA    Continuous Infusions:    Objective: VITAL SIGNS: Temp: 98.4 F (36.9 C) (02/01 0530) Temp Source: Oral (02/01 0530) BP: 104/54 mmHg (02/01 0530) Pulse Rate: 85 (02/01 0530) SPO2; FIO2:   Intake/Output Summary (Last 24 hours) at 06/25/15 1414 Last data filed at 06/25/15 1009  Gross per 24 hour  Intake    600 ml  Output      0 ml  Net    600 ml     Exam: General:A/O 4, NAD,, No acute respiratory distress Eyes: Negative headache, negative scleral hemorrhage ENT: Negative Runny nose, negative gingival bleeding, Neck:  Negative scars, masses, torticollis, lymphadenopathy, JVD Lungs: Clear to auscultation bilaterally without wheezes or crackles Cardiovascular: Regular rate and rhythm without murmur gallop or rub normal S1 and S2 Abdomen: nondistended, positive soft, bowel sounds, no rebound, no ascites, no appreciable mass- mild left upper abdominal tenderness   Extremities: No significant cyanosis, clubbing, or edema bilateral lower extremities   Data Reviewed: Basic Metabolic Panel:  Recent Labs Lab 06/19/15 0426 06/23/15 0850 06/24/15 0545  NA 134* 133* 134*  K 3.5 3.2* 4.2  CL 100* 97* 98*  CO2 '22 24 25  '$ GLUCOSE 150* 236* 133*  BUN <5* 7 6  CREATININE 0.97 0.95 0.93  CALCIUM 8.1* 8.2* 8.4*  MG 1.9  --   --    Liver Function Tests: No results for input(s): AST, ALT, ALKPHOS, BILITOT, PROT, ALBUMIN in the last 168 hours. No results for input(s): LIPASE, AMYLASE in the last 168 hours. No results for input(s): AMMONIA in the last 168 hours. CBC:  Recent Labs Lab 06/20/15 1940 06/21/15 0842 06/22/15 0230 06/23/15 0850 06/24/15 0545 06/25/15 0651  WBC 18.0* 24.9* 18.9* 20.0* 18.9* 14.8*  NEUTROABS 14.8* 23.9*  --   --   --   --   HGB 8.1* 8.4* 7.6* 7.8* 7.9* 7.8*  HCT 24.5* 25.1* 22.7* 22.9* 23.2* 23.2*  MCV 92.5 92.3 92.3 92.0 92.4 92.1  PLT 335 301 340 321 360 348   Cardiac Enzymes: No results for input(s): CKTOTAL, CKMB, CKMBINDEX, TROPONINI in the last 168 hours. BNP (last 3 results) No results for input(s): BNP in the last 8760 hours.  ProBNP (last 3 results) No results for input(s): PROBNP in the last 8760 hours.  CBG:  Recent Labs  Lab 06/24/15 1247 06/24/15 1709 06/24/15 2202 06/25/15 0901 06/25/15 1242  GLUCAP 198* 128* 118* 142* 94    Recent Results (from the past 240 hour(s))  Culture, blood (routine x 2)     Status: None   Collection Time: 06/15/15  3:09 PM  Result Value Ref Range Status   Specimen Description BLOOD RIGHT HAND  Final   Special Requests BOTTLES DRAWN AEROBIC ONLY 10CC  Final   Culture  Setup Time   Final    GRAM NEGATIVE RODS AEROBIC BOTTLE ONLY CRITICAL RESULT CALLED TO, READ BACK BY AND VERIFIED WITH: L. HITT RN [phone] [id number] GREEN R CONFIRMED BY M. CAMPBELL     Culture   Final    ENTEROBACTER CLOACAE SUSCEPTIBILITIES PERFORMED ON PREVIOUS CULTURE WITHIN THE LAST 5 DAYS.     Report Status 06/20/2015 FINAL  Final  Urine culture     Status: None   Collection Time: 06/15/15  4:10 PM  Result Value Ref Range Status   Specimen Description URINE, RANDOM  Final   Special Requests NONE  Final   Culture >=100,000 COLONIES/mL ENTEROBACTER CLOACAE  Final   Report Status 06/18/2015 FINAL  Final   Organism ID, Bacteria ENTEROBACTER CLOACAE  Final      Susceptibility   Enterobacter cloacae - MIC*    CEFAZOLIN <=4 RESISTANT Resistant     CEFTRIAXONE <=1 SENSITIVE Sensitive     CIPROFLOXACIN <=0.25 SENSITIVE Sensitive     GENTAMICIN <=1 SENSITIVE Sensitive     IMIPENEM <=0.25 SENSITIVE Sensitive     NITROFURANTOIN <=16 SENSITIVE Sensitive     TRIMETH/SULFA <=20 SENSITIVE Sensitive     PIP/TAZO <=4 SENSITIVE Sensitive     * >=100,000 COLONIES/mL ENTEROBACTER CLOACAE  MRSA PCR Screening     Status: None   Collection Time: 06/15/15  9:59 PM  Result Value Ref Range Status   MRSA by PCR NEGATIVE NEGATIVE Final    Comment:        The GeneXpert MRSA Assay (FDA approved for NASAL specimens only), is one component of a comprehensive MRSA colonization surveillance program. It is not intended to diagnose MRSA infection nor to guide or monitor treatment for MRSA infections.   C difficile quick scan w PCR reflex     Status: None   Collection Time: 06/16/15 10:54 AM  Result Value Ref Range Status   C Diff antigen NEGATIVE NEGATIVE Final   C Diff toxin NEGATIVE NEGATIVE Final   C Diff interpretation Negative for toxigenic C. difficile  Final  Culture, blood (Routine X 2) w Reflex to ID Panel     Status: None   Collection Time: 06/18/15  2:00 PM  Result Value Ref Range Status   Specimen Description BLOOD RIGHT HAND  Final   Special Requests BOTTLES DRAWN AEROBIC AND ANAEROBIC 10CC  Final   Culture NO GROWTH 5 DAYS  Final   Report Status 06/23/2015 FINAL  Final  Culture, blood (Routine X 2) w Reflex to ID Panel     Status: None   Collection Time: 06/18/15  2:15 PM   Result Value Ref Range Status   Specimen Description BLOOD LEFT ANTECUBITAL  Final   Special Requests BOTTLES DRAWN AEROBIC AND ANAEROBIC 10CC  Final   Culture NO GROWTH 5 DAYS  Final   Report Status 06/23/2015 FINAL  Final  Gastrointestinal Panel by PCR , Stool     Status: None   Collection Time: 06/22/15  5:41 AM  Result Value Ref Range Status   Campylobacter species  NOT DETECTED NOT DETECTED Final   Plesimonas shigelloides NOT DETECTED NOT DETECTED Final   Salmonella species NOT DETECTED NOT DETECTED Final   Yersinia enterocolitica NOT DETECTED NOT DETECTED Final   Vibrio species NOT DETECTED NOT DETECTED Final   Vibrio cholerae NOT DETECTED NOT DETECTED Final   Enteroaggregative E coli (EAEC) NOT DETECTED NOT DETECTED Final   Enteropathogenic E coli (EPEC) NOT DETECTED NOT DETECTED Final   Enterotoxigenic E coli (ETEC) NOT DETECTED NOT DETECTED Final   Shiga like toxin producing E coli (STEC) NOT DETECTED NOT DETECTED Final   E. coli O157 NOT DETECTED NOT DETECTED Final   Shigella/Enteroinvasive E coli (EIEC) NOT DETECTED NOT DETECTED Final   Cryptosporidium NOT DETECTED NOT DETECTED Final   Cyclospora cayetanensis NOT DETECTED NOT DETECTED Final   Entamoeba histolytica NOT DETECTED NOT DETECTED Final   Giardia lamblia NOT DETECTED NOT DETECTED Final   Adenovirus F40/41 NOT DETECTED NOT DETECTED Final   Astrovirus NOT DETECTED NOT DETECTED Final   Norovirus GI/GII NOT DETECTED NOT DETECTED Final   Rotavirus A NOT DETECTED NOT DETECTED Final   Sapovirus (I, II, IV, and V) NOT DETECTED NOT DETECTED Final     Studies:  Recent x-ray studies have been reviewed in detail by the Attending Physician  Scheduled Meds:  Scheduled Meds: . ciprofloxacin  750 mg Oral BID  . diclofenac sodium  2 g Topical QID  . ferrous sulfate  325 mg Oral BID WC  . furosemide  40 mg Oral Daily  . heparin subcutaneous  5,000 Units Subcutaneous 3 times per day  . insulin aspart  0-5 Units  Subcutaneous QHS  . insulin aspart  0-9 Units Subcutaneous TID WC  . sodium chloride  3 mL Intravenous Q12H    Time spent on care of this patient: 30 mins   Nita Sells , MD Verneita Griffes, MD [facility] [phone]

## 2015-06-25 NOTE — Progress Notes (Unsigned)
Recent events reviewed.  I have made a follow-up appt for Becky Patterson at the [facility] at 9:30 AM; have ordered outpatient PET to be done late FEB-- radiology will call her with date and time.  May discharge patient at your discretion. Please let me know if I can be of further help.

## 2015-06-25 NOTE — Telephone Encounter (Signed)
2/1 pof noted for new patient and this information was left on a voicemail for HIM/Tiff

## 2015-06-26 LAB — CBC
HEMATOCRIT: 25.2 % — AB (ref 36.0–46.0)
Hemoglobin: 8.5 g/dL — ABNORMAL LOW (ref 12.0–15.0)
MCH: 31.3 pg (ref 26.0–34.0)
MCHC: 33.7 g/dL (ref 30.0–36.0)
MCV: 92.6 fL (ref 78.0–100.0)
Platelets: 386 10*3/uL (ref 150–400)
RBC: 2.72 MIL/uL — AB (ref 3.87–5.11)
RDW: 14 % (ref 11.5–15.5)
WBC: 16.2 10*3/uL — AB (ref 4.0–10.5)

## 2015-06-26 LAB — OCCULT BLOOD X 1 CARD TO LAB, STOOL: Fecal Occult Bld: NEGATIVE

## 2015-06-26 MED ORDER — PROMETHAZINE HCL 6.25 MG/5ML PO SYRP: 12.5000 mg | ORAL_SOLUTION | Freq: Four times a day (QID) | ORAL | Status: DC | PRN

## 2015-06-26 MED ORDER — FERROUS SULFATE 325 (65 FE) MG PO TABS: 325.0000 mg | ORAL_TABLET | Freq: Two times a day (BID) | ORAL | Status: AC

## 2015-06-26 MED ORDER — TRAMADOL HCL 50 MG PO TABS: 50.0000 mg | ORAL_TABLET | Freq: Four times a day (QID) | ORAL | Status: DC | PRN

## 2015-06-26 MED ORDER — FUROSEMIDE 40 MG PO TABS: 40.0000 mg | ORAL_TABLET | Freq: Every day | ORAL | Status: AC

## 2015-06-26 MED ORDER — CIPROFLOXACIN HCL 750 MG PO TABS: 750.0000 mg | ORAL_TABLET | Freq: Two times a day (BID) | ORAL | Status: DC

## 2015-06-26 NOTE — Care Management Note (Signed)
Case Management Note  Patient Details  Name: Becky Patterson MRN: [id number] Date of Birth: 11-21-1955  Subjective/Objective:                    Action/Plan:   Expected Discharge Date:                  Expected Discharge Plan:  Home/Self Care  In-House Referral:     Discharge planning Services  CM Consult  Post Acute Care Choice:    Choice offered to:     DME Arranged:    DME Agency:     HH Arranged:    [place] Agency:     Status of Service:  In process, will continue to follow  Medicare Important Message Given:    Date Medicare IM Given:    Medicare IM give by:    Date Additional Medicare IM Given:    Additional Medicare Important Message give by:     If discussed at [place] of Stay Meetings, dates discussed:  06-26-15  Additional Comments: UR updated  Marilu Favre, RN 06/26/2015, 8:58 AM

## 2015-06-26 NOTE — Discharge Summary (Signed)
Physician Discharge Summary  Becky Patterson JOI:[id number] DOB: April 25, 1956 DOA: 06/15/2015  PCP: No primary care provider on file.  Admit date: 06/15/2015 Discharge date: 06/26/2015  Time spent: 35 minutes  Recommendations for Outpatient Follow-up:  1. Patient needs completion of fluoroquinolone on March 7 to complete 5 weeks of therapy for endocarditis bloodstream infection 2. Patient should follow up with infectious disease as an outpatient with infectious disease clinic at Dr. Bradd Burner 3. Patient should get regular blood work inclusive of CBC, CRP, bmet weekly reported to infectious disease clinic 4. Cardiology should follow-up as an outpatient to denote if clearance has been noted from echocardiogram standpoint-this should be done after completion of therapy or at timing as per infectious disease specialist 5. Prescriptions printed and given to patient for follow-up 6. Consider outpatient PET scan/biopsy as patient had a 1.8 cm left lower lung mass/liver mass patient will need to have this set up as an outpatient 7. Consider diabetes teaching as an outpatient have not started any oral meds this admission 8. Has appointment at [facility] at 9:30 AM; have ordered outpatient PET to be done late FEB-- radiology will call her with date and time.  Discharge Diagnoses:  Active Problems:   Lung mass   Sepsis secondary to UTI [facility])   Mass of lower lobe of left lung   Tobacco abuse   Anemia of chronic disease   Hyponatremia   Hyperglycemia   Hypokalemia   Enterococcal bacteremia   Colitis   Pyelonephritis   Discharge Condition: Improved  Diet recommendation: Heart healthy low-salt  Filed Weights   06/15/15 1451 06/15/15 2131 06/24/15 0925  Weight: 90.266 kg (199 lb) 91.9 kg (202 lb 9.6 oz) 91.627 kg (202 lb)    History of present illness:  60 year old Caucasian female admitted 06/14/14 with fever chills temperature 102.4 tachycardic 130s lactic acid 3 Had recently completed a  steroid taper for back pain as well found to have a lung mass on CT scan and felt to have sepsis secondary to enterococcal pyelonephritis bacteremia Also found to have splenic infarcts-see below   Hospital Course:  Sepsisdue to Enterococcus UTI/ Pyelonephritis, bacteremia and Endocarditis - leukocytosis persists- continues to have temps- ID following -Unsure splenic infarcts could be a possible leukocytosis and fever? - found to have splenic infarcts- possibly septic- ECHO showing vegetation on mitral valve- TEE confirms that vegetations are present of the Mitral valves -repeat blood cultures are negative - continue antibiotics per ID- - ID recommending Oral CIpro -If afebrile over the next 24-48 hours would consider discharge home and symptomatic management as an outpatient Will need screening labs as an outpatient as well-  Splenic infarcts - CT abd/pelvis performed as fevers were nto resolve ing- this showed new splenic infarcts  -hematology consulted- as we could not rule out an intracardiac thrombus, Heparin started by hematology -Lab work as ordered by hematology including DR VVT, PTT-LA mix, hexagon phase phospholipid and CA-19-9 are all within normal range, CEA -Could consider Jak-2 - TEE confirms mitral valve vegetatios and no intracardiac thrombus- stopped Heparin.   Acute hypoxia -Resolved  Left thigh pain - started yesterday and is limiting her mobility - start Voltaren gel as is appears to be a muscle sprain- follow for improvement  Hypokalemia - replace and recheck in AM  1.8cm superior segment LLL Lung mass  -metastasis of unknown primary vs hamartoma vs primary lung CA - Radiology recommends PET/CT as outpt rather than a bx at this time - CT scan  head negative for metastasis  Tobacco Abuse  -Patient continues to smoke. -Counseled patient on the absolute need to discontinue smoking completely and permanently  Anemia of chronic disease with Iron  deficiency - anemia panel reveals Iron levels are low- Ferretin level may be falsely elevated in setting of infection- start oral Iron treatment - minor drop noted after being started on Heparin- no signs of bleeding- may be anemia of acute inflammation - follow hemoglobin trend - check stool occults  Hyperglycemia - no prior history of DM  -Hemoglobin A1c 6.9- diabetes teaching- nutrition teaching - follow sugars TID and give insulin as needed - will hold off on starting hypoglycemics and see if she can improve her sugars with diet control and weight loss Will need close outpatient monitoring-  Hypomagnesemia - replaced  Hypokalemia - improved with replacement  Procedures:  Echocardiogram 06/23/15 Findings: Please see echo section for full report. Normal LV size with EF 60-65%. Normal wall motion. Normal right ventricular size and systolic function. Normal right atrial size with prominent Eustachian ridge. Normal left atrial size with no LA appendage thrombus. No evidence by color doppler for PFO or ASD. Trivial TR with no vegetation on tricuspid valve. Trivial PI with no vegetation on tricuspid valve. Trileaflet aortic valve with no aortic insufficiency or stenosis. No vegetation on aortic valve. The mitral valve had mobile, multilobed vegetation involving both the anterior and posterior leaflets. 1.5 x 0.7 cm dimension with valve closed. There was only trivial mitral regurgitation. Normal caliber aorta with minimal plaque.   Impression: Mitral valve endocarditis, vegetation involves both leaflets but with trivial MR, does not seem to be causing significant destruction of valve at this point.   Consultations:  Infectious disease  Interventional radiology  Cardiology   Discharge Exam: Filed Vitals:   06/25/15 2115 06/26/15 0525  BP: 94/43 106/56  Pulse: 91 92  Temp: 99 F (37.2 C) 99.3 F (37.4 C)  Resp: 19 19    General: She is okay  Respiratory: Shows  no shortness of breath no. No chills Tolerating diet but had a lobe and nausea vomiting this morning   Discharge Instructions   Discharge Instructions    Diet - low sodium heart healthy    Complete by:  As directed      Discharge instructions    Complete by:  As directed   Complete course of oral Antibiotics on March 7th Get Lab work weekly while on Antibiotics U should follow-up with infectious disease as an outpatient and he may need a repeat echocardiogram at some point Continue iron pills     Increase activity slowly    Complete by:  As directed           Current Discharge Medication List    START taking these medications   Details  ciprofloxacin (CIPRO) 750 MG tablet Take 1 tablet (750 mg total) by mouth 2 (two) times daily. Qty: 70 tablet, Refills: 0    ferrous sulfate 325 (65 FE) MG tablet Take 1 tablet (325 mg total) by mouth 2 (two) times daily with a meal. Qty: 30 tablet, Refills: 3    furosemide (LASIX) 40 MG tablet Take 1 tablet (40 mg total) by mouth daily. Qty: 30 tablet, Refills: 0    promethazine (PHENERGAN) 6.25 MG/5ML syrup Take 10 mLs (12.5 mg total) by mouth every 6 (six) hours as needed for nausea or vomiting. Qty: 120 mL, Refills: 0    traMADol (ULTRAM) 50 MG tablet Take 1-2 tablets (50-100 mg  total) by mouth every 6 (six) hours as needed for moderate pain. Qty: 30 tablet, Refills: 0      CONTINUE these medications which have NOT CHANGED   Details  acetaminophen (TYLENOL) 500 MG tablet Take 1,000 mg by mouth 2 (two) times daily as needed for mild pain or headache.    Hydrocodone-Acetaminophen 5-300 MG TABS Take 1 tablet by mouth every 6 (six) hours as needed (for pain). Reported on 06/15/2015 Refills: 0    naproxen sodium (ANAPROX) 220 MG tablet Take 440 mg by mouth 2 (two) times daily as needed (for pain).      STOP taking these medications     predniSONE (DELTASONE) 20 MG tablet        Allergies  Allergen Reactions  . Codeine Nausea  And Vomiting   Follow-up Information    Follow up with Lester Kinsman, PA-C On 07/04/2015.   Specialty:  Physician Assistant   Why:  10:00, for hospital follow up-  will need 24 hr notice if canceled.   Contact information:   [place] [phone]        The results of significant diagnostics from this hospitalization (including imaging, microbiology, ancillary and laboratory) are listed below for reference.    Significant Diagnostic Studies: Dg Chest 2 View  06/15/2015  CLINICAL DATA:  Productive cough for 3 weeks, initial encounter EXAM: CHEST - 2 VIEW COMPARISON:  None. FINDINGS: Cardiac shadow is within normal limits. The lungs are well aerated bilaterally. There is a well-circumscribed 2 cm nodule identified in the mid left lung which appears to project over the spine in the left lower lobe on the lateral projection. Additionally a second somewhat nodular density is noted in the medial costophrenic angle on the left. These changes are suspicious for metastatic disease in CT of the chest is recommended. IMPRESSION: Nodular changes on the left. This is suspicious for metastatic disease. CT of the chest is recommended Electronically Signed   By: Inez Catalina M.D.   On: 06/15/2015 16:01   Dg Lumbar Spine Complete  06/15/2015  CLINICAL DATA:  Lumbar spine pain for 2 weeks, right hip pain, history of lumbar fusion 30 years ago EXAM: LUMBAR SPINE - COMPLETE 4+ VIEW COMPARISON:  None. FINDINGS: L3 through the sacrum bilateral osseous fusion masses. Focal irregular sclerosis over the right sacroiliac joint primarily on the iliac side. This process measures 5 cm. Left sacroiliac joint normal. Grade 1 borderline grade 2 anterior listhesis of L4 on L5. Severe L4-5 degenerative disc disease. Mild degenerative disc disease at L5-S1 and L3-L4. Moderate degenerative disc disease at L2-3. Minimal degenerative disc disease at L1-2. No evidence of vertebral body  fracture. IMPRESSION: Postoperative and degenerative changes as described above. Irregular sclerosis right iliac wing. In metastatic lesions not excluded. There are no prior studies for comparison. If no prior studies can't be obtained to document long-term stability in this finding, the possibility of malignancy would have to be considered and bone scan would be suggested. Electronically Signed   By: Skipper Cliche M.D.   On: 06/15/2015 17:25   Ct Head W Wo Contrast  06/17/2015  CLINICAL DATA:  60 year old female with lung mass discovered on CT 2 days ago. Staging. Subsequent encounter. EXAM: CT HEAD WITHOUT AND WITH CONTRAST TECHNIQUE: Contiguous axial images were obtained from the base of the skull through the vertex without and with intravenous contrast CONTRAST:  86m OMNIPAQUE IOHEXOL 300 MG/ML  SOLN COMPARISON:  None. FINDINGS: Trace  paranasal sinus mucosal thickening. Tympanic cavities and mastoids are clear. No suspicious osseous lesion. Visualized orbits and scalp soft tissues are within normal limits. Minimal Calcified atherosclerosis at the skull base. Cerebral volume is within normal limits for age. No midline shift, ventriculomegaly, mass effect, evidence of mass lesion, intracranial hemorrhage or evidence of cortically based acute infarction. Gray-white matter differentiation is within normal limits throughout the brain. No abnormal enhancement identified. Major intracranial vascular structures are enhancing. IMPRESSION: Normal for age CT appearance of the brain. No metastatic disease identified. Electronically Signed   By: Genevie Ann M.D.   On: 06/17/2015 12:46   Ct Chest Wo Contrast  06/15/2015  ADDENDUM REPORT: 06/15/2015 21:31 ADDENDUM: CORRECTED IMPRESSION: 1.8 cm diameter circumscribed nodule in the superior segment left lower lung is indeterminate. Malignancy should be excluded. Consider further evaluation with PET-CT or biopsy. Electronically Signed   By: Lucienne Capers M.D.   On:  06/15/2015 21:31  06/15/2015  CLINICAL DATA:  Productive cough for 3 weeks. Decreased appetite. Left lung nodule on chest x-ray 06/15/2015. EXAM: CT CHEST WITHOUT CONTRAST TECHNIQUE: Multidetector CT imaging of the chest was performed following the standard protocol without IV contrast. COMPARISON:  Chest radiograph 06/15/2015 FINDINGS: There is a circumscribed nodule in the superior segment left lower lobe measuring 1.8 cm diameter. Appearance is suspicious for malignancy, possibly metastasis due to the shape. No significant spiculation. No other nodules identified. Consider PET-CT versus biopsy for further evaluation. Atelectasis in both lung bases. No focal consolidation. No pneumothorax. No pleural effusions. Esophagus is decompressed. No significant lymphadenopathy in the mediastinum or hilar regions on noncontrast imaging. Normal caliber thoracic aorta. Normal heart size. Visualized portions of the upper abdominal organs demonstrate surgical absence of the gallbladder. Possibility of prominent lymph nodes in the left periaortic region although incompletely evaluated. Degenerative changes in the thoracic spine. IMPRESSION: 1.8 cm diameter circumscribed nodule in the superior segment left lobe of liver is indeterminate. Malignancy should be excluded. Consider further evaluation with PET-CT or biopsy. Electronically Signed: By: Lucienne Capers M.D. On: 06/15/2015 19:04   Ct Chest W Contrast  06/21/2015  CLINICAL DATA:  60 year old admitted on 06/15/2015 with 2 week history of diarrhea, intermittent nausea and vomiting, anorexia, amylase. On the day of admission the patient developed fever and shaking chills. Patient currently has Enterobacter sepsis. Followup bilateral pyelonephritis. EXAM: CT CHEST, ABDOMEN, AND PELVIS WITH CONTRAST TECHNIQUE: Multidetector CT imaging of the chest, abdomen and pelvis was performed following the standard protocol during bolus administration of intravenous contrast. CONTRAST:   137m OMNIPAQUE IOHEXOL 300 MG/ML IV. Oral contrast was also administered. COMPARISON:  CT abdomen and pelvis 5 days ago.  CT chest 6 days ago. FINDINGS: CT CHEST FINDINGS Cardiovascular: Heart size normal. No visible coronary atherosclerosis. No significant pericardial effusion. Mild atherosclerosis involving the aortic arch and the origin of the left subclavian artery. No evidence of aneurysm or dissection. Mediastinum/Lymph Nodes: No pathologic lymphadenopathy. Small amount of oral contrast in the upper and mid esophagus. No evidence of hiatal hernia. Normal-appearing thyroid gland. Lungs/Pleura: Approximate 1.8 cm well-circumscribed low-attenuation nodule in the left lower lobe as noted previously, Hounsfield measurement of -6. No pulmonary parenchymal nodules or masses elsewhere in either lung. Mild atelectasis in the dependent portions of both lower lobes, minimally increased since the examination 6 days ago. Lungs otherwise clear without confluent airspace consolidation or interstitial lung disease. No pleural effusions. No pleural masses. Musculoskeletal: Mild diffuse thoracic spondylosis without acute abnormality. CT ABDOMEN and PELVIS FINDINGS Hepatobiliary: Liver normal  in size and appearance. Gallbladder surgically absent. No unexpected biliary ductal dilation. Pancreas: Normal in appearance without evidence of mass, ductal dilation, or inflammation. Spleen: Geographic areas of low attenuation involving the upper pole and lower pole of the spleen, new since the examination 5 days ago. Adrenals/Urinary Tract: Stable left adrenal nodule measuring approximately 1.3 x 2.1 x 1.5 cm. Normal right adrenal gland. Geographic areas of low attenuation involving both kidneys, right greater than left, associated with generalized edema involving the left kidney and wall thickening involving the left renal pelvis and proximal left ureter, best seen on the delayed images, not significantly changed. No evidence of  urinary tract calculi on either side. Normal-appearing urinary bladder. Stomach/Bowel: Stomach normal in appearance for the degree of distention. Normal-appearing small bowel. Sigmoid colon diverticulosis without evidence of acute diverticulitis. Remainder of the colon unremarkable. Lipoma involving the ileocecal valve. Appendix not visualized but no pericecal inflammation. No ascites. Vascular/Lymphatic: Moderate to severe aortoiliac atherosclerosis without aneurysm. Visceral arteries atherosclerotic though patent. Accessory lower pole left renal artery arising from the distal abdominal aorta. No pathologic lymphadenopathy. Reproductive: Normal-appearing uterus and ovaries without evidence of adnexal mass. Other: Edema in the subcutaneous tissues of the bilateral flank and back, likely dependent due to prolonged supine positioning. Musculoskeletal: Prior surgical of the posterior elements at L4 and L5. Severe disc space narrowing and grade 2 spondylolisthesis of L4 on L5 measuring approximately 1.8 cm. Sclerosis involving the right iliac bone, likely a healed bone donor site. Osseous demineralization. IMPRESSION: 1. Mild dependent atelectasis in the lower lobes, left greater than right. No acute cardiopulmonary disease otherwise. 2. Circumscribed 1.8 cm low-density nodule in the left lower lobe, likely a benign hamartoma. PET-CT may be confirmatory. 3. 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 4. No significant change in bilateral pyelonephritis and in the inflammation involving the left renal pelvis and proximal left ureter. 5. Sigmoid colon diverticulosis without evidence acute diverticulitis. 6. Likely postsurgical changes at L4 and L5 and involving the right iliac bone. No convincing evidence of osseous metastatic disease. Electronically Signed   By: Evangeline Dakin M.D.   On: 06/21/2015 17:10   Ct Angio Chest Pe W/cm &/or Wo Cm  06/22/2015  CLINICAL DATA:   Acute onset of hypoxia.  Initial encounter. EXAM: CT ANGIOGRAPHY CHEST WITH CONTRAST TECHNIQUE: Multidetector CT imaging of the chest was performed using the standard protocol during bolus administration of intravenous contrast. Multiplanar CT image reconstructions and MIPs were obtained to evaluate the vascular anatomy. CONTRAST:  71m OMNIPAQUE IOHEXOL 350 MG/ML SOLN COMPARISON:  CT of the chest performed earlier today at 4:15 p.m. FINDINGS: There is no evidence of pulmonary embolus. A 2.0 cm nodule is noted at the superior aspect of the left lower lobe. Mild bibasilar atelectasis is noted. The lungs are otherwise clear. There is no evidence of pleural effusion or pneumothorax. No masses are identified; no abnormal focal contrast enhancement is seen. The mediastinum is grossly unremarkable in appearance. No mediastinal lymphadenopathy is seen. No pericardial effusion is identified. The great vessels are grossly unremarkable in appearance. No axillary lymphadenopathy is seen. The thyroid gland is unremarkable in appearance. Splenic infarcts are again noted, with mild associated soft tissue stranding. The visualized portions of the liver are grossly unremarkable. Postoperative change is noted about the gallbladder fossa. The visualized portions of the stomach, pancreas and adrenal glands are within normal limits. No acute osseous abnormalities are seen. Review of the MIP  images confirms the above findings. IMPRESSION: 1. No evidence of pulmonary embolus. 2. 2.0 cm nodule at the superior aspect of the left lower lung lobe. PET/CT would be helpful for further evaluation, to exclude malignancy. 3. Mild bibasilar atelectasis noted.  Lungs otherwise clear. 4. Splenic infarcts again noted, with mild associated soft tissue inflammation. The patient has had a CT of the chest on January 22nd, a CT of the abdomen and pelvis on January 23rd, a CT of the head on January 24th, and a CT of the chest, abdomen and pelvis earlier  today, as well as this current CTA of the chest. Given that 5 CTs have already performed in less than 1 week, including 3 CTs of the chest, further CT imaging is not recommended in the near future, except if deemed absolutely clinically necessary, or in the case of the PET/CT to evaluate the lung nodule on an elective nonemergent basis. Electronically Signed   By: Garald Balding M.D.   On: 06/22/2015 00:05   Ct Abdomen Pelvis W Contrast  06/21/2015  CLINICAL DATA:  60 year old admitted on 06/15/2015 with 2 week history of diarrhea, intermittent nausea and vomiting, anorexia, amylase. On the day of admission the patient developed fever and shaking chills. Patient currently has Enterobacter sepsis. Followup bilateral pyelonephritis. EXAM: CT CHEST, ABDOMEN, AND PELVIS WITH CONTRAST TECHNIQUE: Multidetector CT imaging of the chest, abdomen and pelvis was performed following the standard protocol during bolus administration of intravenous contrast. CONTRAST:  156m OMNIPAQUE IOHEXOL 300 MG/ML IV. Oral contrast was also administered. COMPARISON:  CT abdomen and pelvis 5 days ago.  CT chest 6 days ago. FINDINGS: CT CHEST FINDINGS Cardiovascular: Heart size normal. No visible coronary atherosclerosis. No significant pericardial effusion. Mild atherosclerosis involving the aortic arch and the origin of the left subclavian artery. No evidence of aneurysm or dissection. Mediastinum/Lymph Nodes: No pathologic lymphadenopathy. Small amount of oral contrast in the upper and mid esophagus. No evidence of hiatal hernia. Normal-appearing thyroid gland. Lungs/Pleura: Approximate 1.8 cm well-circumscribed low-attenuation nodule in the left lower lobe as noted previously, Hounsfield measurement of -6. No pulmonary parenchymal nodules or masses elsewhere in either lung. Mild atelectasis in the dependent portions of both lower lobes, minimally increased since the examination 6 days ago. Lungs otherwise clear without confluent  airspace consolidation or interstitial lung disease. No pleural effusions. No pleural masses. Musculoskeletal: Mild diffuse thoracic spondylosis without acute abnormality. CT ABDOMEN and PELVIS FINDINGS Hepatobiliary: Liver normal in size and appearance. Gallbladder surgically absent. No unexpected biliary ductal dilation. Pancreas: Normal in appearance without evidence of mass, ductal dilation, or inflammation. Spleen: Geographic areas of low attenuation involving the upper pole and lower pole of the spleen, new since the examination 5 days ago. Adrenals/Urinary Tract: Stable left adrenal nodule measuring approximately 1.3 x 2.1 x 1.5 cm. Normal right adrenal gland. Geographic areas of low attenuation involving both kidneys, right greater than left, associated with generalized edema involving the left kidney and wall thickening involving the left renal pelvis and proximal left ureter, best seen on the delayed images, not significantly changed. No evidence of urinary tract calculi on either side. Normal-appearing urinary bladder. Stomach/Bowel: Stomach normal in appearance for the degree of distention. Normal-appearing small bowel. Sigmoid colon diverticulosis without evidence of acute diverticulitis. Remainder of the colon unremarkable. Lipoma involving the ileocecal valve. Appendix not visualized but no pericecal inflammation. No ascites. Vascular/Lymphatic: Moderate to severe aortoiliac atherosclerosis without aneurysm. Visceral arteries atherosclerotic though patent. Accessory lower pole left renal artery arising from the  distal abdominal aorta. No pathologic lymphadenopathy. Reproductive: Normal-appearing uterus and ovaries without evidence of adnexal mass. Other: Edema in the subcutaneous tissues of the bilateral flank and back, likely dependent due to prolonged supine positioning. Musculoskeletal: Prior surgical of the posterior elements at L4 and L5. Severe disc space narrowing and grade 2 spondylolisthesis  of L4 on L5 measuring approximately 1.8 cm. Sclerosis involving the right iliac bone, likely a healed bone donor site. Osseous demineralization. IMPRESSION: 1. Mild dependent atelectasis in the lower lobes, left greater than right. No acute cardiopulmonary disease otherwise. 2. Circumscribed 1.8 cm low-density nodule in the left lower lobe, likely a benign hamartoma. PET-CT may be confirmatory. 3. 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 4. No significant change in bilateral pyelonephritis and in the inflammation involving the left renal pelvis and proximal left ureter. 5. Sigmoid colon diverticulosis without evidence acute diverticulitis. 6. Likely postsurgical changes at L4 and L5 and involving the right iliac bone. No convincing evidence of osseous metastatic disease. Electronically Signed   By: Evangeline Dakin M.D.   On: 06/21/2015 17:10   Ct Abdomen Pelvis W Contrast  06/16/2015  CLINICAL DATA:  Malaise and diarrhea beginning 2-3 weeks ago with occasional vomiting. Fever and chills 06/15/2015. Sepsis. Question colitis. Initial encounter. EXAM: CT ABDOMEN AND PELVIS WITH CONTRAST TECHNIQUE: Multidetector CT imaging of the abdomen and pelvis was performed using the standard protocol following bolus administration of intravenous contrast. CONTRAST:  100 mL OMNIPAQUE IOHEXOL 300 MG/ML  SOLN COMPARISON:  None. FINDINGS: There is some dependent atelectasis in the lung bases. No pleural or pericardial effusion. Both kidneys demonstrate areas of decreased parenchymal enhancement. Stranding about both kidneys is worse on the left. Stranding is seen about the left intrarenal collecting system, renal pelvis and ureter. No stone or obstructing lesion is identified. The urinary bladder is incompletely distended. Uterus and adnexa are unremarkable. The patient is status post cholecystectomy. The liver, spleen, adrenal glands, pancreas and biliary tree appear normal.  There is sigmoid diverticulosis without diverticulitis. The colon is otherwise unremarkable. The appendix is not visualized. No pericecal inflammatory process is seen. The stomach and small bowel appear normal. There is no lymphadenopathy or fluid. Aortoiliac atherosclerosis without aneurysm is identified. The no lytic or sclerotic bony lesion is seen. The patient is status post L4-5 laminectomy and fusion. There is 1.3 cm anterolisthesis L4 on L5. IMPRESSION: Findings consistent with pyelonephritis which appears much worse on the left. Negative for colitis. Atherosclerosis. Status post cholecystectomy. Diverticulosis without diverticulitis. Electronically Signed   By: Inge Rise M.D.   On: 06/16/2015 21:04   Dg Hip Unilat With Pelvis 2-3 Views Right  06/15/2015  CLINICAL DATA:  60 year old female with right hip pain after injury while walking 2 weeks ago. Initial encounter. EXAM: DG HIP (WITH OR WITHOUT PELVIS) 2-3V RIGHT COMPARISON:  None. FINDINGS: Femoral heads are normally located. Hip joint spaces are preserved. Pelvis appears intact. There is sclerosis along the medial right iliac bone which probably is postoperative in nature; there are lower lumbar postoperative fusion masses demonstrated bilaterally. The SI joints otherwise appear normal. Proximal left femur appears intact. Proximal right femur intact. IMPRESSION: No acute osseous abnormality identified about the right hip or pelvis. Postoperative changes to the medial right iliac bone and visualized lower lumbar levels. Electronically Signed   By: Genevie Ann M.D.   On: 06/15/2015 17:29    Microbiology: Recent Results (from the past 240 hour(s))  C difficile quick scan w PCR reflex     Status: None   Collection Time: 06/16/15 10:54 AM  Result Value Ref Range Status   C Diff antigen NEGATIVE NEGATIVE Final   C Diff toxin NEGATIVE NEGATIVE Final   C Diff interpretation Negative for toxigenic C. difficile  Final  Culture, blood (Routine X 2)  w Reflex to ID Panel     Status: None   Collection Time: 06/18/15  2:00 PM  Result Value Ref Range Status   Specimen Description BLOOD RIGHT HAND  Final   Special Requests BOTTLES DRAWN AEROBIC AND ANAEROBIC 10CC  Final   Culture NO GROWTH 5 DAYS  Final   Report Status 06/23/2015 FINAL  Final  Culture, blood (Routine X 2) w Reflex to ID Panel     Status: None   Collection Time: 06/18/15  2:15 PM  Result Value Ref Range Status   Specimen Description BLOOD LEFT ANTECUBITAL  Final   Special Requests BOTTLES DRAWN AEROBIC AND ANAEROBIC 10CC  Final   Culture NO GROWTH 5 DAYS  Final   Report Status 06/23/2015 FINAL  Final  Gastrointestinal Panel by PCR , Stool     Status: None   Collection Time: 06/22/15  5:41 AM  Result Value Ref Range Status   Campylobacter species NOT DETECTED NOT DETECTED Final   Plesimonas shigelloides NOT DETECTED NOT DETECTED Final   Salmonella species NOT DETECTED NOT DETECTED Final   Yersinia enterocolitica NOT DETECTED NOT DETECTED Final   Vibrio species NOT DETECTED NOT DETECTED Final   Vibrio cholerae NOT DETECTED NOT DETECTED Final   Enteroaggregative E coli (EAEC) NOT DETECTED NOT DETECTED Final   Enteropathogenic E coli (EPEC) NOT DETECTED NOT DETECTED Final   Enterotoxigenic E coli (ETEC) NOT DETECTED NOT DETECTED Final   Shiga like toxin producing E coli (STEC) NOT DETECTED NOT DETECTED Final   E. coli O157 NOT DETECTED NOT DETECTED Final   Shigella/Enteroinvasive E coli (EIEC) NOT DETECTED NOT DETECTED Final   Cryptosporidium NOT DETECTED NOT DETECTED Final   Cyclospora cayetanensis NOT DETECTED NOT DETECTED Final   Entamoeba histolytica NOT DETECTED NOT DETECTED Final   Giardia lamblia NOT DETECTED NOT DETECTED Final   Adenovirus F40/41 NOT DETECTED NOT DETECTED Final   Astrovirus NOT DETECTED NOT DETECTED Final   Norovirus GI/GII NOT DETECTED NOT DETECTED Final   Rotavirus A NOT DETECTED NOT DETECTED Final   Sapovirus (I, II, IV, and V) NOT DETECTED  NOT DETECTED Final     Labs: Basic Metabolic Panel:  Recent Labs Lab 06/23/15 0850 06/24/15 0545  NA 133* 134*  K 3.2* 4.2  CL 97* 98*  CO2 24 25  GLUCOSE 236* 133*  BUN 7 6  CREATININE 0.95 0.93  CALCIUM 8.2* 8.4*   Liver Function Tests: No results for input(s): AST, ALT, ALKPHOS, BILITOT, PROT, ALBUMIN in the last 168 hours. No results for input(s): LIPASE, AMYLASE in the last 168 hours. No results for input(s): AMMONIA in the last 168 hours. CBC:  Recent Labs Lab 06/20/15 1940 06/21/15 4665 06/22/15 0230 06/23/15 0850 06/24/15 0545 06/25/15 0651 06/26/15 0701  WBC 18.0* 24.9* 18.9* 20.0* 18.9* 14.8* 16.2*  NEUTROABS 14.8* 23.9*  --   --   --   --   --   HGB 8.1* 8.4* 7.6* 7.8* 7.9* 7.8* 8.5*  HCT 24.5* 25.1* 22.7* 22.9* 23.2* 23.2* 25.2*  MCV 92.5 92.3 92.3 92.0 92.4 92.1 92.6  PLT 335 301 340 321 360 348 386  Cardiac Enzymes: No results for input(s): CKTOTAL, CKMB, CKMBINDEX, TROPONINI in the last 168 hours. BNP: BNP (last 3 results) No results for input(s): BNP in the last 8760 hours.  ProBNP (last 3 results) No results for input(s): PROBNP in the last 8760 hours.  CBG:  Recent Labs Lab 06/24/15 2202 06/25/15 0901 06/25/15 1242 06/25/15 1701 06/25/15 2141  GLUCAP 118* 142* 94 94 179*       Signed:  Nita Sells MD   [facility] 06/26/2015, 9:46 AM

## 2015-06-26 NOTE — Progress Notes (Signed)
Pt discharged to home.  Discharge instructions explained to pt.  Pt has no questions at the time of discharge.  IV's removed.  Pt states she has all belongings.  Pt taken off unit via wheelchair by volunteer services.

## 2015-06-29 ENCOUNTER — Emergency Department (HOSPITAL_COMMUNITY): Payer: 59

## 2015-06-29 ENCOUNTER — Inpatient Hospital Stay (HOSPITAL_COMMUNITY)
Admission: EM | Admit: 2015-06-29 | Discharge: 2015-07-07 | DRG: 023 | Disposition: A | Discharge status: Rehabilitation Facility | Payer: 59 | Attending: Neurology | Admitting: Neurology

## 2015-06-29 ENCOUNTER — Inpatient Hospital Stay (HOSPITAL_COMMUNITY): Payer: 59 | Admitting: Anesthesiology

## 2015-06-29 ENCOUNTER — Inpatient Hospital Stay (HOSPITAL_COMMUNITY): Payer: 59

## 2015-06-29 ENCOUNTER — Encounter (HOSPITAL_COMMUNITY)
Admission: EM | Disposition: A | Discharge status: Rehabilitation Facility | Payer: Self-pay | Source: Home / Self Care | Attending: Neurology

## 2015-06-29 ENCOUNTER — Encounter (HOSPITAL_COMMUNITY): Payer: Self-pay | Admitting: Emergency Medicine

## 2015-06-29 DIAGNOSIS — G4459 Other complicated headache syndrome: Secondary | ICD-10-CM | POA: Insufficient documentation

## 2015-06-29 DIAGNOSIS — R1312 Dysphagia, oropharyngeal phase: Secondary | ICD-10-CM | POA: Diagnosis present

## 2015-06-29 DIAGNOSIS — L299 Pruritus, unspecified: Secondary | ICD-10-CM | POA: Diagnosis not present

## 2015-06-29 DIAGNOSIS — I63511 Cerebral infarction due to unspecified occlusion or stenosis of right middle cerebral artery: Secondary | ICD-10-CM | POA: Diagnosis not present

## 2015-06-29 DIAGNOSIS — G441 Vascular headache, not elsewhere classified: Secondary | ICD-10-CM | POA: Diagnosis not present

## 2015-06-29 DIAGNOSIS — Z66 Do not resuscitate: Secondary | ICD-10-CM | POA: Diagnosis not present

## 2015-06-29 DIAGNOSIS — T8089XA Other complications following infusion, transfusion and therapeutic injection, initial encounter: Secondary | ICD-10-CM | POA: Diagnosis not present

## 2015-06-29 DIAGNOSIS — T361X5A Adverse effect of cephalosporins and other beta-lactam antibiotics, initial encounter: Secondary | ICD-10-CM | POA: Diagnosis not present

## 2015-06-29 DIAGNOSIS — I76 Septic arterial embolism: Secondary | ICD-10-CM | POA: Diagnosis not present

## 2015-06-29 DIAGNOSIS — E860 Dehydration: Secondary | ICD-10-CM | POA: Diagnosis not present

## 2015-06-29 DIAGNOSIS — R0602 Shortness of breath: Secondary | ICD-10-CM | POA: Diagnosis not present

## 2015-06-29 DIAGNOSIS — I63131 Cerebral infarction due to embolism of right carotid artery: Secondary | ICD-10-CM | POA: Diagnosis not present

## 2015-06-29 DIAGNOSIS — E870 Hyperosmolality and hypernatremia: Secondary | ICD-10-CM | POA: Diagnosis not present

## 2015-06-29 DIAGNOSIS — R160 Hepatomegaly, not elsewhere classified: Secondary | ICD-10-CM | POA: Diagnosis present

## 2015-06-29 DIAGNOSIS — J9601 Acute respiratory failure with hypoxia: Secondary | ICD-10-CM | POA: Diagnosis present

## 2015-06-29 DIAGNOSIS — I63031 Cerebral infarction due to thrombosis of right carotid artery: Secondary | ICD-10-CM | POA: Diagnosis not present

## 2015-06-29 DIAGNOSIS — B9689 Other specified bacterial agents as the cause of diseases classified elsewhere: Secondary | ICD-10-CM | POA: Diagnosis not present

## 2015-06-29 DIAGNOSIS — I10 Essential (primary) hypertension: Secondary | ICD-10-CM

## 2015-06-29 DIAGNOSIS — E6609 Other obesity due to excess calories: Secondary | ICD-10-CM | POA: Diagnosis present

## 2015-06-29 DIAGNOSIS — R4702 Dysphasia: Secondary | ICD-10-CM | POA: Diagnosis present

## 2015-06-29 DIAGNOSIS — I6310 Cerebral infarction due to embolism of unspecified precerebral artery: Secondary | ICD-10-CM | POA: Diagnosis not present

## 2015-06-29 DIAGNOSIS — G8194 Hemiplegia, unspecified affecting left nondominant side: Secondary | ICD-10-CM | POA: Diagnosis present

## 2015-06-29 DIAGNOSIS — R414 Neurologic neglect syndrome: Secondary | ICD-10-CM | POA: Diagnosis present

## 2015-06-29 DIAGNOSIS — Z7189 Other specified counseling: Secondary | ICD-10-CM | POA: Insufficient documentation

## 2015-06-29 DIAGNOSIS — I339 Acute and subacute endocarditis, unspecified: Secondary | ICD-10-CM

## 2015-06-29 DIAGNOSIS — Z9289 Personal history of other medical treatment: Secondary | ICD-10-CM

## 2015-06-29 DIAGNOSIS — R29810 Facial weakness: Secondary | ICD-10-CM | POA: Diagnosis present

## 2015-06-29 DIAGNOSIS — Y848 Other medical procedures as the cause of abnormal reaction of the patient, or of later complication, without mention of misadventure at the time of the procedure: Secondary | ICD-10-CM | POA: Diagnosis not present

## 2015-06-29 DIAGNOSIS — E1165 Type 2 diabetes mellitus with hyperglycemia: Secondary | ICD-10-CM | POA: Diagnosis present

## 2015-06-29 DIAGNOSIS — G9340 Encephalopathy, unspecified: Secondary | ICD-10-CM | POA: Diagnosis present

## 2015-06-29 DIAGNOSIS — G935 Compression of brain: Secondary | ICD-10-CM | POA: Diagnosis present

## 2015-06-29 DIAGNOSIS — I749 Embolism and thrombosis of unspecified artery: Secondary | ICD-10-CM | POA: Diagnosis not present

## 2015-06-29 DIAGNOSIS — Z515 Encounter for palliative care: Secondary | ICD-10-CM | POA: Diagnosis not present

## 2015-06-29 DIAGNOSIS — R471 Dysarthria and anarthria: Secondary | ICD-10-CM | POA: Diagnosis present

## 2015-06-29 DIAGNOSIS — I959 Hypotension, unspecified: Secondary | ICD-10-CM | POA: Diagnosis present

## 2015-06-29 DIAGNOSIS — Z781 Physical restraint status: Secondary | ICD-10-CM | POA: Diagnosis not present

## 2015-06-29 DIAGNOSIS — I639 Cerebral infarction, unspecified: Secondary | ICD-10-CM

## 2015-06-29 DIAGNOSIS — I330 Acute and subacute infective endocarditis: Secondary | ICD-10-CM | POA: Diagnosis present

## 2015-06-29 DIAGNOSIS — Z79899 Other long term (current) drug therapy: Secondary | ICD-10-CM | POA: Diagnosis not present

## 2015-06-29 DIAGNOSIS — I340 Nonrheumatic mitral (valve) insufficiency: Secondary | ICD-10-CM | POA: Diagnosis not present

## 2015-06-29 DIAGNOSIS — I6350 Cerebral infarction due to unspecified occlusion or stenosis of unspecified cerebral artery: Secondary | ICD-10-CM | POA: Diagnosis not present

## 2015-06-29 DIAGNOSIS — E871 Hypo-osmolality and hyponatremia: Secondary | ICD-10-CM | POA: Diagnosis present

## 2015-06-29 DIAGNOSIS — Q251 Coarctation of aorta: Secondary | ICD-10-CM

## 2015-06-29 DIAGNOSIS — H53469 Homonymous bilateral field defects, unspecified side: Secondary | ICD-10-CM | POA: Diagnosis present

## 2015-06-29 DIAGNOSIS — R531 Weakness: Secondary | ICD-10-CM | POA: Diagnosis present

## 2015-06-29 DIAGNOSIS — E876 Hypokalemia: Secondary | ICD-10-CM | POA: Diagnosis not present

## 2015-06-29 DIAGNOSIS — G936 Cerebral edema: Secondary | ICD-10-CM | POA: Diagnosis present

## 2015-06-29 DIAGNOSIS — Z6837 Body mass index (BMI) 37.0-37.9, adult: Secondary | ICD-10-CM

## 2015-06-29 DIAGNOSIS — R918 Other nonspecific abnormal finding of lung field: Secondary | ICD-10-CM | POA: Diagnosis present

## 2015-06-29 DIAGNOSIS — I69352 Hemiplegia and hemiparesis following cerebral infarction affecting left dominant side: Secondary | ICD-10-CM | POA: Diagnosis not present

## 2015-06-29 DIAGNOSIS — J9600 Acute respiratory failure, unspecified whether with hypoxia or hypercapnia: Secondary | ICD-10-CM | POA: Diagnosis not present

## 2015-06-29 DIAGNOSIS — I69922 Dysarthria following unspecified cerebrovascular disease: Secondary | ICD-10-CM | POA: Diagnosis not present

## 2015-06-29 DIAGNOSIS — F17200 Nicotine dependence, unspecified, uncomplicated: Secondary | ICD-10-CM | POA: Diagnosis present

## 2015-06-29 DIAGNOSIS — R4189 Other symptoms and signs involving cognitive functions and awareness: Secondary | ICD-10-CM | POA: Diagnosis present

## 2015-06-29 DIAGNOSIS — D62 Acute posthemorrhagic anemia: Secondary | ICD-10-CM | POA: Diagnosis not present

## 2015-06-29 DIAGNOSIS — D638 Anemia in other chronic diseases classified elsewhere: Secondary | ICD-10-CM | POA: Diagnosis present

## 2015-06-29 DIAGNOSIS — A419 Sepsis, unspecified organism: Secondary | ICD-10-CM | POA: Diagnosis not present

## 2015-06-29 DIAGNOSIS — I63411 Cerebral infarction due to embolism of right middle cerebral artery: Principal | ICD-10-CM | POA: Diagnosis present

## 2015-06-29 DIAGNOSIS — R1310 Dysphagia, unspecified: Secondary | ICD-10-CM | POA: Diagnosis not present

## 2015-06-29 DIAGNOSIS — I69391 Dysphagia following cerebral infarction: Secondary | ICD-10-CM

## 2015-06-29 DIAGNOSIS — H5340 Unspecified visual field defects: Secondary | ICD-10-CM | POA: Diagnosis present

## 2015-06-29 DIAGNOSIS — E669 Obesity, unspecified: Secondary | ICD-10-CM | POA: Diagnosis present

## 2015-06-29 DIAGNOSIS — L270 Generalized skin eruption due to drugs and medicaments taken internally: Secondary | ICD-10-CM | POA: Diagnosis not present

## 2015-06-29 DIAGNOSIS — B952 Enterococcus as the cause of diseases classified elsewhere: Secondary | ICD-10-CM | POA: Diagnosis not present

## 2015-06-29 DIAGNOSIS — I69898 Other sequelae of other cerebrovascular disease: Secondary | ICD-10-CM | POA: Diagnosis not present

## 2015-06-29 DIAGNOSIS — I69322 Dysarthria following cerebral infarction: Secondary | ICD-10-CM

## 2015-06-29 DIAGNOSIS — R21 Rash and other nonspecific skin eruption: Secondary | ICD-10-CM | POA: Diagnosis not present

## 2015-06-29 HISTORY — DX: Epistaxis: R04.0

## 2015-06-29 HISTORY — DX: Other nonspecific abnormal finding of lung field: R91.8

## 2015-06-29 HISTORY — DX: Infarction of spleen: D73.5

## 2015-06-29 HISTORY — DX: Acute and subacute infective endocarditis: I33.0

## 2015-06-29 HISTORY — PX: RADIOLOGY WITH ANESTHESIA: SHX6223

## 2015-06-29 LAB — I-STAT CHEM 8, ED
BUN: 8 mg/dL (ref 6–20)
CALCIUM ION: 1.09 mmol/L — AB (ref 1.13–1.30)
CREATININE: 0.9 mg/dL (ref 0.44–1.00)
Chloride: 96 mmol/L — ABNORMAL LOW (ref 101–111)
Glucose, Bld: 176 mg/dL — ABNORMAL HIGH (ref 65–99)
HEMATOCRIT: 28 % — AB (ref 36.0–46.0)
HEMOGLOBIN: 9.5 g/dL — AB (ref 12.0–15.0)
Potassium: 3.8 mmol/L (ref 3.5–5.1)
SODIUM: 132 mmol/L — AB (ref 135–145)
TCO2: 25 mmol/L (ref 0–100)

## 2015-06-29 LAB — COMPREHENSIVE METABOLIC PANEL
ALBUMIN: 2.2 g/dL — AB (ref 3.5–5.0)
ALK PHOS: 76 U/L (ref 38–126)
ALT: 16 U/L (ref 14–54)
ANION GAP: 12 (ref 5–15)
AST: 20 U/L (ref 15–41)
BILIRUBIN TOTAL: 0.4 mg/dL (ref 0.3–1.2)
BUN: 8 mg/dL (ref 6–20)
CALCIUM: 8.4 mg/dL — AB (ref 8.9–10.3)
CO2: 24 mmol/L (ref 22–32)
Chloride: 94 mmol/L — ABNORMAL LOW (ref 101–111)
Creatinine, Ser: 1.04 mg/dL — ABNORMAL HIGH (ref 0.44–1.00)
GFR, EST NON AFRICAN AMERICAN: 57 mL/min — AB (ref >60)
GLUCOSE: 182 mg/dL — AB (ref 65–99)
POTASSIUM: 3.9 mmol/L (ref 3.5–5.1)
Sodium: 130 mmol/L — ABNORMAL LOW (ref 135–145)
Total Protein: 6.4 g/dL — ABNORMAL LOW (ref 6.5–8.1)

## 2015-06-29 LAB — PREPARE RBC (CROSSMATCH)

## 2015-06-29 LAB — CBC
HCT: 25.9 % — ABNORMAL LOW (ref 36.0–46.0)
HEMOGLOBIN: 8.7 g/dL — AB (ref 12.0–15.0)
MCH: 31.2 pg (ref 26.0–34.0)
MCHC: 33.6 g/dL (ref 30.0–36.0)
MCV: 92.8 fL (ref 78.0–100.0)
Platelets: 390 10*3/uL (ref 150–400)
RBC: 2.79 MIL/uL — AB (ref 3.87–5.11)
RDW: 14.1 % (ref 11.5–15.5)
WBC: 15 10*3/uL — ABNORMAL HIGH (ref 4.0–10.5)

## 2015-06-29 LAB — RAPID URINE DRUG SCREEN, HOSP PERFORMED
AMPHETAMINES: NOT DETECTED
Barbiturates: NOT DETECTED
Benzodiazepines: NOT DETECTED
Cocaine: NOT DETECTED
OPIATES: NOT DETECTED
TETRAHYDROCANNABINOL: POSITIVE — AB

## 2015-06-29 LAB — URINALYSIS, ROUTINE W REFLEX MICROSCOPIC
BILIRUBIN URINE: NEGATIVE
Glucose, UA: NEGATIVE mg/dL
KETONES UR: NEGATIVE mg/dL
NITRITE: NEGATIVE
PH: 7 (ref 5.0–8.0)
Protein, ur: NEGATIVE mg/dL
Specific Gravity, Urine: 1.009 (ref 1.005–1.030)

## 2015-06-29 LAB — GLUCOSE, CAPILLARY
GLUCOSE-CAPILLARY: 143 mg/dL — AB (ref 65–99)
GLUCOSE-CAPILLARY: 165 mg/dL — AB (ref 65–99)
Glucose-Capillary: 127 mg/dL — ABNORMAL HIGH (ref 65–99)

## 2015-06-29 LAB — I-STAT TROPONIN, ED: Troponin i, poc: 0.07 ng/mL (ref 0.00–0.08)

## 2015-06-29 LAB — DIFFERENTIAL
Basophils Absolute: 0 10*3/uL (ref 0.0–0.1)
Basophils Relative: 0 %
EOS ABS: 0.2 10*3/uL (ref 0.0–0.7)
EOS PCT: 2 %
LYMPHS ABS: 2.1 10*3/uL (ref 0.7–4.0)
LYMPHS PCT: 14 %
MONO ABS: 0.8 10*3/uL (ref 0.1–1.0)
MONOS PCT: 5 %
NEUTROS PCT: 79 %
Neutro Abs: 11.9 10*3/uL — ABNORMAL HIGH (ref 1.7–7.7)

## 2015-06-29 LAB — ABO/RH: ABO/RH(D): O POS

## 2015-06-29 LAB — URINE MICROSCOPIC-ADD ON: BACTERIA UA: NONE SEEN

## 2015-06-29 LAB — CBG MONITORING, ED: GLUCOSE-CAPILLARY: 154 mg/dL — AB (ref 65–99)

## 2015-06-29 LAB — PROTIME-INR
INR: 1.41 (ref 0.00–1.49)
Prothrombin Time: 17.4 seconds — ABNORMAL HIGH (ref 11.6–15.2)

## 2015-06-29 LAB — ETHANOL: Alcohol, Ethyl (B): <5 mg/dL (ref <5)

## 2015-06-29 LAB — MRSA PCR SCREENING: MRSA by PCR: NEGATIVE

## 2015-06-29 LAB — APTT: aPTT: 38 seconds — ABNORMAL HIGH (ref 24–37)

## 2015-06-29 SURGERY — RADIOLOGY WITH ANESTHESIA
Anesthesia: General

## 2015-06-29 MED ORDER — NITROGLYCERIN 1 MG/10 ML FOR IR/CATH LAB
INTRA_ARTERIAL | Status: AC
  Administered 2015-06-29: 25 ug via INTRA_ARTERIAL
  Filled 2015-06-29: qty 10

## 2015-06-29 MED ORDER — SODIUM CHLORIDE 0.9 % IV BOLUS (SEPSIS)
500.0000 mL | Freq: Once | INTRAVENOUS | Status: AC
  Administered 2015-06-29: 500 mL via INTRAVENOUS

## 2015-06-29 MED ORDER — PROPOFOL 10 MG/ML IV BOLUS
INTRAVENOUS | Status: AC
  Filled 2015-06-29: qty 20

## 2015-06-29 MED ORDER — ROCURONIUM BROMIDE 100 MG/10ML IV SOLN
INTRAVENOUS | Status: DC | PRN
  Administered 2015-06-29: 30 mg via INTRAVENOUS
  Administered 2015-06-29 (×2): 25 mg via INTRAVENOUS
  Administered 2015-06-29: 30 mg via INTRAVENOUS
  Administered 2015-06-29: 20 mg via INTRAVENOUS

## 2015-06-29 MED ORDER — LIDOCAINE HCL (CARDIAC) 20 MG/ML IV SOLN
INTRAVENOUS | Status: AC
  Filled 2015-06-29: qty 5

## 2015-06-29 MED ORDER — SUCCINYLCHOLINE CHLORIDE 20 MG/ML IJ SOLN
INTRAMUSCULAR | Status: DC | PRN
  Administered 2015-06-29: 120 mg via INTRAVENOUS

## 2015-06-29 MED ORDER — LIDOCAINE HCL (CARDIAC) 20 MG/ML IV SOLN
INTRAVENOUS | Status: DC | PRN
  Administered 2015-06-29: 20 mg via INTRATRACHEAL

## 2015-06-29 MED ORDER — ACETAMINOPHEN 650 MG RE SUPP
650.0000 mg | Freq: Four times a day (QID) | RECTAL | Status: DC | PRN
  Administered 2015-06-29: 650 mg via RECTAL
  Filled 2015-06-29: qty 1

## 2015-06-29 MED ORDER — PHENYLEPHRINE HCL 10 MG/ML IJ SOLN
INTRAMUSCULAR | Status: DC | PRN
  Administered 2015-06-29 (×4): 80 ug via INTRAVENOUS

## 2015-06-29 MED ORDER — DEXTROSE 5 % IV SOLN
2.0000 g | INTRAVENOUS | Status: DC
  Administered 2015-06-29: 2 g via INTRAVENOUS
  Filled 2015-06-29 (×2): qty 2

## 2015-06-29 MED ORDER — PROPOFOL 10 MG/ML IV BOLUS
INTRAVENOUS | Status: DC | PRN
Start: 2015-06-29 — End: 2015-06-29
  Administered 2015-06-29: 100 mg via INTRAVENOUS

## 2015-06-29 MED ORDER — INSULIN ASPART 100 UNIT/ML ~~LOC~~ SOLN
0.0000 [IU] | SUBCUTANEOUS | Status: DC
  Administered 2015-06-29: 2 [IU] via SUBCUTANEOUS
  Administered 2015-06-29 – 2015-06-30 (×2): 3 [IU] via SUBCUTANEOUS
  Administered 2015-06-30: 5 [IU] via SUBCUTANEOUS
  Administered 2015-06-30: 2 [IU] via SUBCUTANEOUS
  Administered 2015-06-30 (×2): 3 [IU] via SUBCUTANEOUS
  Administered 2015-07-01: 2 [IU] via SUBCUTANEOUS
  Administered 2015-07-01: 3 [IU] via SUBCUTANEOUS
  Administered 2015-07-01 (×4): 2 [IU] via SUBCUTANEOUS
  Administered 2015-07-01: 3 [IU] via SUBCUTANEOUS
  Administered 2015-07-02 (×2): 2 [IU] via SUBCUTANEOUS
  Administered 2015-07-02: 3 [IU] via SUBCUTANEOUS
  Administered 2015-07-03: 2 [IU] via SUBCUTANEOUS
  Administered 2015-07-03: 3 [IU] via SUBCUTANEOUS
  Administered 2015-07-04: 1 [IU] via SUBCUTANEOUS
  Administered 2015-07-04 – 2015-07-05 (×2): 2 [IU] via SUBCUTANEOUS

## 2015-06-29 MED ORDER — SODIUM CHLORIDE 0.9 % IV SOLN
10.0000 mL/h | Freq: Once | INTRAVENOUS | Status: AC
  Administered 2015-06-29: 10 mL/h via INTRAVENOUS

## 2015-06-29 MED ORDER — PROPOFOL 500 MG/50ML IV EMUL
INTRAVENOUS | Status: DC | PRN
  Administered 2015-06-29: 50 ug/kg/min via INTRAVENOUS

## 2015-06-29 MED ORDER — IOHEXOL 300 MG/ML  SOLN
150.0000 mL | Freq: Once | INTRAMUSCULAR | Status: AC | PRN
  Administered 2015-06-29: 150 mL via INTRA_ARTERIAL

## 2015-06-29 MED ORDER — MIDAZOLAM HCL 2 MG/2ML IJ SOLN
INTRAMUSCULAR | Status: DC | PRN
  Administered 2015-06-29: 4 mg via INTRAVENOUS

## 2015-06-29 MED ORDER — NICARDIPINE HCL IN NACL 20-0.86 MG/200ML-% IV SOLN: 5.0000 mg/h | INTRAVENOUS | Status: DC

## 2015-06-29 MED ORDER — EPTIFIBATIDE 20 MG/10ML IV SOLN
20.0000 mg | Freq: Once | INTRAVENOUS | Status: AC
  Administered 2015-06-29: 2 mg via INTRAVENOUS
  Administered 2015-06-29: 4 mg via INTRAVENOUS
  Administered 2015-06-29: 2 mg via INTRAVENOUS

## 2015-06-29 MED ORDER — PHENYLEPHRINE HCL 10 MG/ML IJ SOLN
0.0000 ug/min | INTRAVENOUS | Status: DC
  Administered 2015-06-29: 110 ug/min via INTRAVENOUS
  Filled 2015-06-29 (×4): qty 1

## 2015-06-29 MED ORDER — ONDANSETRON HCL 4 MG/2ML IJ SOLN: 4.0000 mg | Freq: Four times a day (QID) | INTRAMUSCULAR | Status: DC | PRN

## 2015-06-29 MED ORDER — ALTEPLASE (STROKE) FULL DOSE INFUSION
0.9000 mg/kg | Freq: Once | INTRAVENOUS | Status: DC
  Filled 2015-06-29: qty 100

## 2015-06-29 MED ORDER — ANTISEPTIC ORAL RINSE SOLUTION (CORINZ)
7.0000 mL | OROMUCOSAL | Status: DC
  Administered 2015-06-29 – 2015-07-03 (×36): 7 mL via OROMUCOSAL

## 2015-06-29 MED ORDER — SODIUM CHLORIDE 0.9 % IV SOLN
INTRAVENOUS | Status: DC | PRN
  Administered 2015-06-29: 12:00:00 via INTRAVENOUS

## 2015-06-29 MED ORDER — SODIUM CHLORIDE 0.9 % IV BOLUS (SEPSIS)
1000.0000 mL | Freq: Once | INTRAVENOUS | Status: AC
  Administered 2015-06-29: 1000 mL via INTRAVENOUS

## 2015-06-29 MED ORDER — STROKE: EARLY STAGES OF RECOVERY BOOK
Freq: Once | Status: AC
Start: 2015-06-29 — End: 2015-06-29
  Administered 2015-06-29: 16:00:00
  Filled 2015-06-29: qty 1

## 2015-06-29 MED ORDER — NITROGLYCERIN 1 MG/10 ML FOR IR/CATH LAB
100.0000 ug | Freq: Once | INTRA_ARTERIAL | Status: AC
  Administered 2015-06-29 (×2): 25 ug via INTRA_ARTERIAL

## 2015-06-29 MED ORDER — CIPROFLOXACIN IN D5W 400 MG/200ML IV SOLN
750.0000 mg | Freq: Two times a day (BID) | INTRAVENOUS | Status: DC
  Administered 2015-06-29: 750 mg via INTRAVENOUS
  Filled 2015-06-29 (×3): qty 400

## 2015-06-29 MED ORDER — EPHEDRINE SULFATE 50 MG/ML IJ SOLN
INTRAMUSCULAR | Status: DC | PRN
  Administered 2015-06-29: 10 mg via INTRAVENOUS
  Administered 2015-06-29: 20 mg via INTRAVENOUS

## 2015-06-29 MED ORDER — SUCCINYLCHOLINE CHLORIDE 20 MG/ML IJ SOLN
INTRAMUSCULAR | Status: AC
  Filled 2015-06-29: qty 1

## 2015-06-29 MED ORDER — IOHEXOL 350 MG/ML SOLN
50.0000 mL | Freq: Once | INTRAVENOUS | Status: AC | PRN
  Administered 2015-06-29: 50 mL via INTRAVENOUS

## 2015-06-29 MED ORDER — PROPOFOL 1000 MG/100ML IV EMUL
5.0000 ug/kg/min | INTRAVENOUS | Status: DC
  Administered 2015-06-29: 30 ug/kg/min via INTRAVENOUS
  Administered 2015-06-29: 50 ug/kg/min via INTRAVENOUS
  Filled 2015-06-29 (×2): qty 100

## 2015-06-29 MED ORDER — ACETAMINOPHEN 500 MG PO TABS: 1000.0000 mg | ORAL_TABLET | Freq: Four times a day (QID) | ORAL | Status: DC | PRN

## 2015-06-29 MED ORDER — ONDANSETRON HCL 4 MG/2ML IJ SOLN
INTRAMUSCULAR | Status: AC
  Filled 2015-06-29: qty 2

## 2015-06-29 MED ORDER — EPTIFIBATIDE 20 MG/10ML IV SOLN
INTRAVENOUS | Status: AC
  Administered 2015-06-29: 4 mg via INTRAVENOUS
  Filled 2015-06-29: qty 10

## 2015-06-29 MED ORDER — CIPROFLOXACIN IN D5W 400 MG/200ML IV SOLN
400.0000 mg | Freq: Three times a day (TID) | INTRAVENOUS | Status: DC
  Administered 2015-06-30: 400 mg via INTRAVENOUS
  Filled 2015-06-29: qty 200

## 2015-06-29 MED ORDER — MIDAZOLAM HCL 2 MG/2ML IJ SOLN: 1.0000 mg | INTRAMUSCULAR | Status: DC | PRN

## 2015-06-29 MED ORDER — INSULIN ASPART 100 UNIT/ML ~~LOC~~ SOLN: 0.0000 [IU] | Freq: Three times a day (TID) | SUBCUTANEOUS | Status: DC

## 2015-06-29 MED ORDER — FENTANYL CITRATE (PF) 250 MCG/5ML IJ SOLN
INTRAMUSCULAR | Status: AC
  Filled 2015-06-29: qty 5

## 2015-06-29 MED ORDER — PANTOPRAZOLE SODIUM 40 MG IV SOLR
40.0000 mg | INTRAVENOUS | Status: DC
  Administered 2015-06-29 – 2015-07-01 (×3): 40 mg via INTRAVENOUS
  Filled 2015-06-29 (×3): qty 40

## 2015-06-29 MED ORDER — SODIUM CHLORIDE 0.9 % IV SOLN
INTRAVENOUS | Status: DC | PRN
  Administered 2015-06-29 (×4): via INTRAVENOUS

## 2015-06-29 MED ORDER — CHLORHEXIDINE GLUCONATE 0.12% ORAL RINSE (MEDLINE KIT)
15.0000 mL | Freq: Two times a day (BID) | OROMUCOSAL | Status: DC
  Administered 2015-06-29 – 2015-07-07 (×14): 15 mL via OROMUCOSAL

## 2015-06-29 MED ORDER — PHENYLEPHRINE HCL 10 MG/ML IJ SOLN
10.0000 mg | INTRAVENOUS | Status: DC | PRN
  Administered 2015-06-29: 60 ug/min via INTRAVENOUS
  Administered 2015-06-29 (×2): 100 ug/min via INTRAVENOUS

## 2015-06-29 MED ORDER — SODIUM CHLORIDE 0.9 % IV SOLN
INTRAVENOUS | Status: DC
  Administered 2015-06-29 – 2015-06-30 (×3): via INTRAVENOUS

## 2015-06-29 MED ORDER — CEFAZOLIN SODIUM-DEXTROSE 2-3 GM-% IV SOLR
INTRAVENOUS | Status: AC
Start: 2015-06-29 — End: 2015-06-29
  Administered 2015-06-29: 2 g via INTRAVENOUS
  Filled 2015-06-29: qty 50

## 2015-06-29 NOTE — ED Notes (Signed)
Pt to ER via [facility] after husband called out for slurred speech. On EMS arrival, per EMS pt was said to have no slurred speech, was able to ambulate to truck, but had bilateral hand weakness. [place] to ER [place], pt suddenly developed left sided facial droop and significant left arm weakness. Code stroke called at this time. VS per EMS - 120/84, 90 heart rate, CBG 200. Pt recently d/c from hospital for sepsis.

## 2015-06-29 NOTE — Progress Notes (Signed)
Code stroke called at [place], Patient arrived to [facility] via [facility] at [phone].  Patient LSN 0630, patient had slurred speech, droop and left side weakness.  NIHSS 10. Pharmacy notified for TPA at 1008, delivered to bedside at 1017.  Delay due to need for foley and need for PIV for CTA.  TPA cancelled by Dr. Leonel Ramsay due to patient with infective endocarditis.  IR team called in

## 2015-06-29 NOTE — H&P (Signed)
Neurology H&P  CC: Left-sided weakness  History is obtained from: Patient, husband  HPI: Becky Patterson is a 60 y.o. female history of infective endocarditis that was treated with discharge on 2/2, continuing on antibiotics at home. She was being treated with fluoroquinolone. She was doing well until today when she had sudden onset of left-sided weakness that started around 6:30 AM. She woke up normal, with clear speech and no weakness and then developed slurred speech and hand weakness. She has progressively declined since that time and is currently having significant left-sided weakness as well as neglect. She was brought into the emergency room as bilateral arm weakness, but once here was recognized that it was left arm weakness and a code stroke was activated. She went for a CT, and once was recognized that she had infective endocarditis TPA was considered contraindicated. She was taken back for a CT angiogram which did show an M1 occlusion with some collateralization.  She was hypotensive and given a total of 1.5 L to try and assist her blood pressure. Being taken to interventional radiology per Dr. Estanislado Pandy.   ROS: A 14 point ROS was performed and is negative except as noted in the HPI.    Past medical history: she did not have significant medical follow-up prior to her most recent hospitalization She was diagnosed with diabetes during this hospitalization   Family History  Problem Relation Age of Onset  . Lung cancer Mother   . Liver cancer Brother   . Heart attack Brother      Social History:  reports that she has been smoking.  She does not have any smokeless tobacco history on file. She reports that she does not drink alcohol or use illicit drugs.   Exam: Current vital signs: BP 101/67 mmHg  Pulse 95  Resp 23  SpO2 96% Vital signs in last 24 hours: Pulse Rate:  [95] 95 (02/05 1015) Resp:  [23] 23 (02/05 1015) BP: (101)/(67) 101/67 mmHg (02/05 1015) SpO2:  [96 %] 96  % (02/05 1015)  Physical Exam  Constitutional: Appears well-developed and well-nourished.  Psych: Affect appropriate to situation Eyes: No scleral injection HENT: No OP obstrucion Head: Normocephalic.  Cardiovascular: Normal rate and regular rhythm.  Respiratory: Effort normal and breath sounds normal to anterior ascultation GI: Soft.  No distension. There is no tenderness.  Skin: WDI  Neuro: Mental Status: Patient is awake, alert, interactive and appropriate with the exception of neglect No signs of aphasia  Cranial Nerves: II: Left hemianopia. Pupils are equal, round, and reactive to light.   III,IV, VI: Initially right gaze preference, and then subsequently developed right gaze deviation V: Facial sensation is decreased on the left VII: Facial movement -right facial droop VIII: hearing is intact to voice X: Uvula elevates symmetrically Motor: Tone is normal. Bulk is normal. 5/5 strength was present on the right, she is able to lift both her left leg and left arm against gravity initially, though this then worsens. 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 She is not a candidate for IV TPA due to infective endocarditis, I would favor taking her to mechanical thrombectomy  Recommendations: 1. HgbA1c, fasting lipid panel  2. MRI the brain without contrast 3. Frequent neuro checks 4. Echocardiogram 5. Carotid dopplers 6. Prophylactic therapy-Antiplatelet med: Aspirin - dose '325mg'$  PO or '300mg'$  PR 7. Risk factor modification 8. Telemetry monitoring 9. PT consult, OT consult, Speech consult 10. I have discussed with infectious disease and  will start ceftriaxone 2 g daily per their recommendation, if neuro outcome is good, would likely need consultation for consideration of thoracic surgery given embolization despite antibiotic therapy 11. Blood culture 12. She has a left lower lung or liver mass and the recommendation was to check a PET scan as an outpatient prior to biopsy 13. SSI for diabetes mellitus 14. Critical care consultation   please page stroke NP  Or  PA  Or MD  M-F from 8am -4 pm starting 06/30/15 as this patient will be followed by the stroke team at this point.   You can look them up on [email or web address]  Password [email or web address]   Roland Rack, MD [facility] [phone]  If 7pm- 7am, please page neurology on call as listed in [place].

## 2015-06-29 NOTE — Anesthesia Postprocedure Evaluation (Signed)
Anesthesia Post Note  Patient: Becky Patterson  Procedure(s) Performed: Procedure(s) (LRB): RADIOLOGY WITH ANESTHESIA (N/A)  Patient location during evaluation: ICU Anesthesia Type: General Level of consciousness: sedated and patient remains intubated per anesthesia plan Vital Signs Assessment: post-procedure vital signs reviewed and stable Respiratory status: patient on ventilator - see flowsheet for VS and patient remains intubated per anesthesia plan Cardiovascular status: stable Anesthetic complications: no    Last Vitals:  Filed Vitals:   06/29/15 1015  BP: 101/67  Pulse: 95  Resp: 23    Last Pain:  Filed Vitals:   06/29/15 1554  PainSc: 0-No pain                 Hadas Jessop

## 2015-06-29 NOTE — Progress Notes (Signed)
Pharmacy Antibiotic Note  Becky Patterson is a 60 y.o. female admitted on 06/29/2015 with left-sided weakness and diagnosed with CVA.  Pharmacy has been consulted for Cipro dosing for infective endocarditis diagnosed in January in setting of pyelonephritis.  No QTc prolongation (444 ms).  Baseline labs reviewed.  Plan: - Cipro '750mg'$  IV Q12H - CTX 2gm IV Q24H per MD - Monitor renal fxn, QTc   Height: '5\' 2"'$  (157.5 cm) Weight: 192 lb 3.9 oz (87.2 kg) IBW/kg (Calculated) : 50.1  Temp (24hrs), Avg:97.9 F (36.6 C), Min:97.9 F (36.6 C), Max:97.9 F (36.6 C)   Recent Labs Lab 06/23/15 0850 06/24/15 0545 06/25/15 0651 06/26/15 0701 06/29/15 1014  WBC 20.0* 18.9* 14.8* 16.2* 15.0*  CREATININE 0.95 0.93  --   --  1.04*  0.90    Estimated Creatinine Clearance: 67.3 mL/min (by C-G formula based on Cr of 0.9).    Allergies  Allergen Reactions  . Codeine Nausea And Vomiting    Antimicrobials this admission: Cipro from PTA >> CTX 2/5 >>  Dose adjustments this admission: N/A  Microbiology results: 2/5 BCx x2 -    Becky Patterson D. Mina Marble, PharmD, BCPS Pager:  [phone] 06/29/2015, 5:45 PM

## 2015-06-29 NOTE — Procedures (Signed)
bILATERAL cca AND rT VERT ARTERY ARTERIOGRAMS FOLLWED BY ATTEMPTED rt ica      TERMINUS AND rt mca OCCLUSION USING X3 PASSES WITH sOLITAIRE fr STENT RETRIEVAL DEVICE AND 8 MMG OF SUPERSELECTIVE ia iNTEGRELIN.  Robust crass filling from Lt ICA vis Acom and RT PCA leptomeningeals.

## 2015-06-29 NOTE — ED Provider Notes (Signed)
CSN: [id number]     Arrival date & time 06/29/15  0950 History   First MD Initiated Contact with Patient 06/29/15 [id number]     Chief Complaint  Patient presents with  . Stroke Symptoms    Patient is a 60 y.o. female presenting with Acute Neurological Problem.  Cerebrovascular Accident This is a new problem. The current episode started 3 to 5 hours ago. The problem occurs constantly. The problem has been rapidly worsening. Pertinent negatives include no abdominal pain and no shortness of breath. Nothing aggravates the symptoms. Nothing relieves the symptoms. She has tried nothing for the symptoms.    History reviewed. No pertinent past medical history. Past Surgical History  Procedure Laterality Date  . Back surgery    . Tee without cardioversion N/A 06/24/2015    Procedure: TRANSESOPHAGEAL ECHOCARDIOGRAM (TEE);  Surgeon: Larey Dresser, MD;  Location: [facility] ENDOSCOPY;  Service: Cardiovascular;  Laterality: N/A;   Family History  Problem Relation Age of Onset  . Lung cancer Mother   . Liver cancer Brother   . Heart attack Brother    Social History  Substance Use Topics  . Smoking status: Current Every Day Smoker  . Smokeless tobacco: None  . Alcohol Use: No   OB History    No data available     Review of Systems  Unable to perform ROS: Acuity of condition  Constitutional: Negative for chills.  Respiratory: Negative for cough and shortness of breath.   Gastrointestinal: Negative for abdominal pain.  Genitourinary: Negative for dysuria.  Neurological: Positive for facial asymmetry, speech difficulty and weakness.  All other systems reviewed and are negative.     Allergies  Codeine  Home Medications   Prior to Admission medications   Medication Sig Start Date End Date Taking? Authorizing Provider  acetaminophen (TYLENOL) 500 MG tablet Take 1,000 mg by mouth 2 (two) times daily as needed for mild pain or headache.   Yes Historical Provider, MD  ciprofloxacin (CIPRO) 750 MG  tablet Take 1 tablet (750 mg total) by mouth 2 (two) times daily. 06/26/15  Yes Nita Sells, MD  ferrous sulfate 325 (65 FE) MG tablet Take 1 tablet (325 mg total) by mouth 2 (two) times daily with a meal. 06/26/15  Yes Nita Sells, MD  furosemide (LASIX) 40 MG tablet Take 1 tablet (40 mg total) by mouth daily. 06/26/15  Yes Nita Sells, MD  Hydrocodone-Acetaminophen 5-300 MG TABS Take 1 tablet by mouth every 6 (six) hours as needed (for pain). Reported on 06/15/2015 06/08/15  Yes Historical Provider, MD  naproxen sodium (ANAPROX) 220 MG tablet Take 440 mg by mouth 2 (two) times daily as needed (for pain).   Yes Historical Provider, MD  promethazine (PHENERGAN) 6.25 MG/5ML syrup Take 10 mLs (12.5 mg total) by mouth every 6 (six) hours as needed for nausea or vomiting. 06/26/15  Yes Nita Sells, MD  traMADol (ULTRAM) 50 MG tablet Take 1-2 tablets (50-100 mg total) by mouth every 6 (six) hours as needed for moderate pain. 06/26/15  Yes Nita Sells, MD   BP 101/67 mmHg  Pulse 95  Resp 23  SpO2 96% Physical Exam  Constitutional: She is oriented to person, place, and time. She appears well-developed and well-nourished.  HENT:  Head: Normocephalic and atraumatic.  Neck: Normal range of motion.  Cardiovascular: Normal rate and regular rhythm.   Pulmonary/Chest: Effort normal. No stridor. No respiratory distress.  Abdominal: Soft. She exhibits no distension.  Musculoskeletal: Normal range of motion. She exhibits no  edema or tenderness.  Neurological: She is alert and oriented to person, place, and time. No cranial nerve deficit.  Left sided facial droop, 3/5 left arm strength, slurred speech  Nursing note and vitals reviewed.   ED Course  Procedures (including critical care time)  CRITICAL CARE Performed by: Merrily Pew  Total critical care time: 30 minutes Critical care time was exclusive of separately billable procedures and treating other patients. Critical  care was necessary to treat or prevent imminent or life-threatening deterioration. Critical care was time spent personally by me on the following activities: development of treatment plan with patient and/or surrogate as well as nursing, discussions with consultants, evaluation of patient's response to treatment, examination of patient, obtaining history from patient or surrogate, ordering and performing treatments and interventions, ordering and review of laboratory studies, ordering and review of radiographic studies, pulse oximetry and re-evaluation of patient's condition.   Labs Review Labs Reviewed  PROTIME-INR - Abnormal; Notable for the following:    Prothrombin Time 17.4 (*)    All other components within normal limits  APTT - Abnormal; Notable for the following:    aPTT 38 (*)    All other components within normal limits  CBC - Abnormal; Notable for the following:    WBC 15.0 (*)    RBC 2.79 (*)    Hemoglobin 8.7 (*)    HCT 25.9 (*)    All other components within normal limits  DIFFERENTIAL - Abnormal; Notable for the following:    Neutro Abs 11.9 (*)    All other components within normal limits  COMPREHENSIVE METABOLIC PANEL - Abnormal; Notable for the following:    Sodium 130 (*)    Chloride 94 (*)    Glucose, Bld 182 (*)    Creatinine, Ser 1.04 (*)    Calcium 8.4 (*)    Total Protein 6.4 (*)    Albumin 2.2 (*)    GFR calc non Af Amer 57 (*)    All other components within normal limits  URINE RAPID DRUG SCREEN, HOSP PERFORMED - Abnormal; Notable for the following:    Tetrahydrocannabinol POSITIVE (*)    All other components within normal limits  URINALYSIS, ROUTINE W REFLEX MICROSCOPIC (NOT AT [facility]) - Abnormal; Notable for the following:    Hgb urine dipstick SMALL (*)    Leukocytes, UA MODERATE (*)    All other components within normal limits  URINE MICROSCOPIC-ADD ON - Abnormal; Notable for the following:    Squamous Epithelial / LPF 0-5 (*)    All other  components within normal limits  I-STAT CHEM 8, ED - Abnormal; Notable for the following:    Sodium 132 (*)    Chloride 96 (*)    Glucose, Bld 176 (*)    Calcium, Ion 1.09 (*)    Hemoglobin 9.5 (*)    HCT 28.0 (*)    All other components within normal limits  CBG MONITORING, ED - Abnormal; Notable for the following:    Glucose-Capillary 154 (*)    All other components within normal limits  CULTURE, BLOOD (ROUTINE X 2)  CULTURE, BLOOD (ROUTINE X 2)  ETHANOL  I-STAT TROPOININ, ED    Imaging Review Ct Angio Head W/cm &/or Wo Cm  06/29/2015  CLINICAL DATA:  60 year 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 Initial encounter. EXAM: CT ANGIOGRAPHY HEAD AND NECK TECHNIQUE: Multidetector CT imaging of the head and neck was performed using the standard protocol during bolus administration of intravenous contrast. Multiplanar  CT image reconstructions and MIPs were obtained to evaluate the vascular anatomy. Carotid stenosis measurements (when applicable) are obtained utilizing NASCET criteria, using the distal internal carotid diameter as the denominator. CONTRAST:  50 mL Omnipaque 350 COMPARISON:  Head CT without contrast 1001 hours today. FINDINGS: CTA NECK Skeleton: Intermittent dental periapical lucency. No acute osseous abnormality identified. Visualized paranasal sinuses and mastoids are clear. Other neck: Negative lung apices other than dependent atelectasis. No superior mediastinal lymphadenopathy. Thyroid, larynx, pharynx, parapharyngeal spaces, retropharyngeal space, sublingual space, submandibular glands, and parotid glands are within normal limits. No cervical lymphadenopathy. Aortic arch: 3 vessel arch configuration. Minimal distal arch calcified plaque. No great vessel origin stenosis. Right carotid system: Negative right CCA and right carotid bifurcation. Moderately tortuous proximal right ICA with a mildly kinked appearance just past the bulb. Otherwise negative  cervical right ICA. Left carotid system: Negative left CCA. Soft plaque in the left ICA origin and bulb less than 50 % stenosis with respect to the distal vessel at the distal bulb. Tortuous cervical left ICA otherwise. Vertebral arteries:No proximal right subclavian artery stenosis. Normal right vertebral artery origin. Mildly dominant right vertebral artery. Soft plaque in the proximal left subclavian artery just proximal to the left vertebral artery origin with less than 50 % stenosis with respect to the distal vessel. The left vertebral artery origin appears normal. Mildly non dominant left vertebral artery appears normal to the skullbase. CTA HEAD Posterior circulation: Mildly dominant distal right vertebral artery. Normal PICA origins. No distal vertebral stenosis. Patent vertebrobasilar junction. Patent basilar artery without stenosis. SCA and left PCA origins are normal, fetal type right PCA origin. PCA branches are within normal limits. Anterior circulation: Both ICA siphons are patent without stenosis. Minimal calcified plaque in the right cavernous segment. Normal right posterior communicating artery origin. The left ICA terminus is normal. Filling defect at the right ICA terminus, MCA and ACA origin best seen on series 505, image 17. 7-8 mm segment of the right MCA M1 segment is nonenhancing, but with collateral enhancement at the right MCA bifurcation and sylvian division. The right ACA A1 segment is non dominant. It remains patent likely supplied from the left. Normal left MCA and ACA origins. Dominant left A1. Anterior communicating artery and bilateral ACA branches are within normal limits. Left MCA M1 segment, bifurcation, and left MCA branches are within normal limits. Venous sinuses: Patent. Anatomic variants: Dominant left ACA A1 segment. Fetal type right PCA origin. Mildly dominant right vertebral artery. Right hemisphere gray-white matter differentiation appears stableand normal compared to  1001 hours today. IMPRESSION: 1. Positive for emergent large vessel occlusion: Occluded right ICA terminus, right MCA and right ACA origins. 7-8 mm occluded segment of the right M1 segment but good right MCA M2 and distal collaterals. Also, the left A1 is dominant such that bilateral A2 and distal ACA flow appears symmetric. Finding of ELVO at the [facility] origin reviewed in person with Dr. Roland Rack on 06/29/2015 at 1105 hours. 2. Mildly to moderately tortuous cervical ICAs. Mild plaque at the right ICA origin and bulb without stenosis. Minimal calcified right ICA siphon plaque. Moderate plaque at the distal left ICA bulb but without hemodynamically significant stenosis. 3. Proximal left subclavian artery soft plaque without significant stenosis. Negative posterior circulation. 4. Grossly stable CT appearance of the brain since 1001 hours. Electronically Signed   By: Genevie Ann M.D.   On: 06/29/2015 11:28   Ct Head Wo Contrast  06/29/2015  CLINICAL DATA:  Code stroke.  Left-sided weakness and slurred speech EXAM: CT HEAD WITHOUT CONTRAST TECHNIQUE: Contiguous axial images were obtained from the base of the skull through the vertex without intravenous contrast. COMPARISON:  CT head 06/17/2015 FINDINGS: Ventricle size normal. Cerebral volume normal for age. Mild hypodensity in the periventricular white matter consistent with mild chronic microvascular ischemia. Negative for acute infarct.  Negative for acute hemorrhage or mass. Calvarium intact. IMPRESSION: Mild chronic microvascular ischemia.  No acute abnormality Critical Value/emergent results were called by telephone at the time of interpretation on 06/29/2015 at 10:13 am to Dr. Leonel Ramsay , who verbally acknowledged these results. Electronically Signed   By: Franchot Gallo M.D.   On: 06/29/2015 10:13   Ct Angio Neck W/cm &/or Wo/cm  06/29/2015  CLINICAL DATA:  60 year 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 Initial encounter.  EXAM: CT ANGIOGRAPHY HEAD AND NECK TECHNIQUE: Multidetector CT imaging of the head and neck was performed using the standard protocol during bolus administration of intravenous contrast. Multiplanar CT image reconstructions and MIPs were obtained to evaluate the vascular anatomy. Carotid stenosis measurements (when applicable) are obtained utilizing NASCET criteria, using the distal internal carotid diameter as the denominator. CONTRAST:  50 mL Omnipaque 350 COMPARISON:  Head CT without contrast 1001 hours today. FINDINGS: CTA NECK Skeleton: Intermittent dental periapical lucency. No acute osseous abnormality identified. Visualized paranasal sinuses and mastoids are clear. Other neck: Negative lung apices other than dependent atelectasis. No superior mediastinal lymphadenopathy. Thyroid, larynx, pharynx, parapharyngeal spaces, retropharyngeal space, sublingual space, submandibular glands, and parotid glands are within normal limits. No cervical lymphadenopathy. Aortic arch: 3 vessel arch configuration. Minimal distal arch calcified plaque. No great vessel origin stenosis. Right carotid system: Negative right CCA and right carotid bifurcation. Moderately tortuous proximal right ICA with a mildly kinked appearance just past the bulb. Otherwise negative cervical right ICA. Left carotid system: Negative left CCA. Soft plaque in the left ICA origin and bulb less than 50 % stenosis with respect to the distal vessel at the distal bulb. Tortuous cervical left ICA otherwise. Vertebral arteries:No proximal right subclavian artery stenosis. Normal right vertebral artery origin. Mildly dominant right vertebral artery. Soft plaque in the proximal left subclavian artery just proximal to the left vertebral artery origin with less than 50 % stenosis with respect to the distal vessel. The left vertebral artery origin appears normal. Mildly non dominant left vertebral artery appears normal to the skullbase. CTA HEAD Posterior  circulation: Mildly dominant distal right vertebral artery. Normal PICA origins. No distal vertebral stenosis. Patent vertebrobasilar junction. Patent basilar artery without stenosis. SCA and left PCA origins are normal, fetal type right PCA origin. PCA branches are within normal limits. Anterior circulation: Both ICA siphons are patent without stenosis. Minimal calcified plaque in the right cavernous segment. Normal right posterior communicating artery origin. The left ICA terminus is normal. Filling defect at the right ICA terminus, MCA and ACA origin best seen on series 505, image 17. 7-8 mm segment of the right MCA M1 segment is nonenhancing, but with collateral enhancement at the right MCA bifurcation and sylvian division. The right ACA A1 segment is non dominant. It remains patent likely supplied from the left. Normal left MCA and ACA origins. Dominant left A1. Anterior communicating artery and bilateral ACA branches are within normal limits. Left MCA M1 segment, bifurcation, and left MCA branches are within normal limits. Venous sinuses: Patent. Anatomic variants: Dominant left ACA A1 segment. Fetal type right PCA origin. Mildly dominant right vertebral artery. Right hemisphere  gray-white matter differentiation appears stableand normal compared to 1001 hours today. IMPRESSION: 1. Positive for emergent large vessel occlusion: Occluded right ICA terminus, right MCA and right ACA origins. 7-8 mm occluded segment of the right M1 segment but good right MCA M2 and distal collaterals. Also, the left A1 is dominant such that bilateral A2 and distal ACA flow appears symmetric. Finding of ELVO at the [facility] origin reviewed in person with Dr. Roland Rack on 06/29/2015 at 1105 hours. 2. Mildly to moderately tortuous cervical ICAs. Mild plaque at the right ICA origin and bulb without stenosis. Minimal calcified right ICA siphon plaque. Moderate plaque at the distal left ICA bulb but without hemodynamically significant  stenosis. 3. Proximal left subclavian artery soft plaque without significant stenosis. Negative posterior circulation. 4. Grossly stable CT appearance of the brain since 1001 hours. Electronically Signed   By: Genevie Ann M.D.   On: 06/29/2015 11:28   I have personally reviewed and evaluated these images and lab results as part of my medical decision-making.   EKG Interpretation   Date/Time:  Sunday June 29 2015 09:55:34 EST Ventricular Rate:  93 PR Interval:  161 QRS Duration: 80 QT Interval:  357 QTC Calculation: 444 R Axis:   44 Text Interpretation:  Sinus rhythm Anterior infarct, old Minimal ST  elevation, inferior leads worsened  from 1/22 Baseline wander in lead(s)  V6 Confirmed by [facility] MD, Corene Cornea [phone]) on 06/29/2015 10:04:01 AM      MDM   Final diagnoses:  Left-sided weakness  Stroke (cerebrum) [facility])    Is a 60 year old female just discharged from the hospital for sepsis presents to the emergency department with a acute onset of bilateral arm weakness progressively worsened to become left greater than right arm weakness to EMS was called. EMSs arrival patient apparently had a normal neurologic exam however upon arrival in the emergency department EMS noticed that the patient had the inability to use her left arm so was brought back here. Nurses informed of the situation and was at the bedside quickly and assess the patientshe also had a left facial droop and decreased left arm strength. Therefore a code stroke was called, consider given TPA however upon review of records neurology and noticed that the patient was here for infective endocarditis and therefore thought TPA was contraindicated as the patient will get a mechanical thrombectomy instead. Patient's mental status is stable throughout her stay here. Rest of labs and imaging are noncontributory.    Merrily Pew, MD 06/29/15 [id number]

## 2015-06-29 NOTE — Progress Notes (Signed)
[facility] Progress Note Patient Name: FONTAINE KOSSMAN DOB: Jan 31, 1956 MRN: [id number]   Date of Service  06/29/2015  HPI/Events of Note  Patient arrives from IR on Propofol and Phenylephrine IV infusions.   eICU Interventions  Will order: 1. Propofol IV infusion. Titrate to RASS = -2 to -3. 2. Phenylephring IV infusion. Titrate to SBP 130-150.     Intervention Category Major Interventions: Hypotension - evaluation and management Minor Interventions: Agitation / anxiety - evaluation and management  Cherl Gorney Eugene 06/29/2015, 3:57 PM

## 2015-06-29 NOTE — Transfer of Care (Signed)
Immediate Anesthesia Transfer of Care Note  Patient: Becky Patterson  Procedure(s) Performed: Procedure(s): RADIOLOGY WITH ANESTHESIA (N/A)  Patient Location: ICU  Anesthesia Type:General  Level of Consciousness: sedated, unresponsive and Patient remains intubated per anesthesia plan  Airway & Oxygen Therapy: Patient remains intubated per anesthesia plan and Patient placed on Ventilator (see vital sign flow sheet for setting)  Post-op Assessment: Report given to RN and Post -op Vital signs reviewed and stable  Post vital signs: Reviewed and stable  Last Vitals:  Filed Vitals:   06/29/15 1015  BP: 101/67  Pulse: 95  Resp: 23    Complications: No apparent anesthesia complications

## 2015-06-29 NOTE — Anesthesia Preprocedure Evaluation (Addendum)
Anesthesia Evaluation  Patient identified by MRN, date of birth, ID band Patient confused    Reviewed: Allergy & Precautions, H&P , NPO status , Patient's Chart, lab work & pertinent test results  Airway Mallampati: II  TM Distance: >3 FB Neck ROM: Full    Dental no notable dental hx. (+) Teeth Intact, Dental Advisory Given   Pulmonary Current Smoker,  H/o lung mass   Pulmonary exam normal breath sounds clear to auscultation       Cardiovascular negative cardio ROS   Rhythm:Regular Rate:Normal     Neuro/Psych CVA, Residual Symptoms negative psych ROS   GI/Hepatic negative GI ROS, Neg liver ROS,   Endo/Other  negative endocrine ROS  Renal/GU negative Renal ROS  negative genitourinary   Musculoskeletal   Abdominal   Peds  Hematology negative hematology ROS (+) anemia ,   Anesthesia Other Findings   Reproductive/Obstetrics negative OB ROS                            Anesthesia Physical Anesthesia Plan  ASA: III and emergent  Anesthesia Plan: General   Post-op Pain Management:    Induction: Intravenous, Rapid sequence and Cricoid pressure planned  Airway Management Planned: Oral ETT  Additional Equipment: Arterial line  Intra-op Plan:   Post-operative Plan: Post-operative intubation/ventilation  Informed Consent: I have reviewed the patients History and Physical, chart, labs and discussed the procedure including the risks, benefits and alternatives for the proposed anesthesia with the patient or authorized representative who has indicated his/her understanding and acceptance.   Dental advisory given  Plan Discussed with: CRNA  Anesthesia Plan Comments:         Anesthesia Quick Evaluation

## 2015-06-29 NOTE — Progress Notes (Signed)
eLink Physician-Brief Progress Note Patient Name: Becky Patterson DOB: 12/04/1955 MRN: [id number]   Date of Service  06/29/2015  HPI/Events of Note  Patient still hypotensive on Neo via PIV. Patient S/P 1u PRBC.  eICU Interventions  1. Place CVL 2. Stat Hgb/Hct & Coags     Intervention Category Major Interventions: Shock - evaluation and management Intermediate Interventions: Hypotension - evaluation and management  Tera Partridge 06/29/2015, 11:36 PM

## 2015-06-29 NOTE — Progress Notes (Signed)
eLink Physician-Brief Progress Note Patient Name: Becky Patterson DOB: 10-22-1955 MRN: [id number]   Date of Service  06/29/2015  HPI/Events of Note  Notified of need for Stress Ulcer Prophylaxis.   eICU Interventions  Will order: Protonix 40 mg IV now and Q day.      Intervention Category Intermediate Interventions: Best-practice therapies (e.g. DVT, beta blocker, etc.)  Sommer,Steven Eugene 06/29/2015, 5:21 PM

## 2015-06-29 NOTE — Consult Note (Signed)
PULMONARY / CRITICAL CARE MEDICINE   Name: Becky Patterson MRN: [id number] DOB: Mar 24, 1956    ADMISSION DATE:  06/29/2015 CONSULTATION DATE:  2/5  REFERRING MD:  Neuro -Kirpatrick   CHIEF COMPLAINT:  Vent management /CVA   HISTORY OF PRESENT ILLNESS:   Becky Patterson is a 60 y.o. female history of infective endocarditis that was treated with discharge on 2/2, continuing on antibiotics at home. She was being treated with fluoroquinolone. She was doing well until today when she had sudden onset of left-sided weakness that started around 6:30 AM. She woke up normal, with clear speech and no weakness and then developed slurred speech and hand weakness. She has progressively declined since that time and is currently having significant left-sided weakness as well as neglect. She was brought into the emergency room as bilateral arm weakness, but once here was recognized that it was left arm weakness and a code stroke was activated. She went for a CT, and once was recognized that she had infective endocarditis TPA was considered contraindicated. She was taken back for a CT angiogram which did show an M1 occlusion with some collateralization.  She was hypotensive and given a total of 1.5 L to try and assist her blood pressure. Being taken to interventional radiology per Dr. Estanislado Pandy.   PAST MEDICAL HISTORY :  She  has no past medical history on file.  PAST SURGICAL HISTORY: She  has past surgical history that includes Back surgery and TEE without cardioversion (N/A, 06/24/2015).  Allergies  Allergen Reactions  . Codeine Nausea And Vomiting    No current facility-administered medications on file prior to encounter.   Current Outpatient Prescriptions on File Prior to Encounter  Medication Sig  . acetaminophen (TYLENOL) 500 MG tablet Take 1,000 mg by mouth 2 (two) times daily as needed for mild pain or headache.  . ciprofloxacin (CIPRO) 750 MG tablet Take 1 tablet (750 mg total) by mouth 2 (two)  times daily.  . ferrous sulfate 325 (65 FE) MG tablet Take 1 tablet (325 mg total) by mouth 2 (two) times daily with a meal.  . furosemide (LASIX) 40 MG tablet Take 1 tablet (40 mg total) by mouth daily.  . Hydrocodone-Acetaminophen 5-300 MG TABS Take 1 tablet by mouth every 6 (six) hours as needed (for pain). Reported on 06/15/2015  . naproxen sodium (ANAPROX) 220 MG tablet Take 440 mg by mouth 2 (two) times daily as needed (for pain).  . promethazine (PHENERGAN) 6.25 MG/5ML syrup Take 10 mLs (12.5 mg total) by mouth every 6 (six) hours as needed for nausea or vomiting.  . traMADol (ULTRAM) 50 MG tablet Take 1-2 tablets (50-100 mg total) by mouth every 6 (six) hours as needed for moderate pain.    FAMILY HISTORY:  Her has no family status information on file.   SOCIAL HISTORY: She  reports that she has been smoking.  She does not have any smokeless tobacco history on file. She reports that she does not drink alcohol or use illicit drugs.  REVIEW OF SYSTEMS:     SUBJECTIVE: Sedated and intubated.   VITAL SIGNS: BP 101/67 mmHg  Pulse 95  Resp 23  SpO2 96%  HEMODYNAMICS:    VENTILATOR SETTINGS:    INTAKE / OUTPUT:    PHYSICAL EXAMINATION: General: Chronically ill appearing female, sedated and ventilated. Neuro:  Sedated, ventilated and paralyzed, unable to examine. HEENT:  [place]/AT, PERRL, EOM-I and MMM. Cardiovascular:  RRR, NL S1/S2, -M/R/G. Lungs:  CTA bilaterally. Abdomen:  Soft, NT, ND and +BS. Musculoskeletal:  -edema and -tenderness. Skin:  Scattered bruises.  LABS:  BMET  Recent Labs Lab 06/23/15 0850 06/24/15 0545 06/29/15 1014  NA 133* 134* 130*  132*  K 3.2* 4.2 3.9  3.8  CL 97* 98* 94*  96*  CO2 '24 25 24  '$ BUN '7 6 8  8  '$ CREATININE 0.95 0.93 1.04*  0.90  GLUCOSE 236* 133* 182*  176*   Electrolytes  Recent Labs Lab 06/23/15 0850 06/24/15 0545 06/29/15 1014  CALCIUM 8.2* 8.4* 8.4*   CBC  Recent Labs Lab 06/25/15 0651 06/26/15 0701  06/29/15 1014  WBC 14.8* 16.2* 15.0*  HGB 7.8* 8.5* 8.7*  9.5*  HCT 23.2* 25.2* 25.9*  28.0*  PLT 348 386 390   Coag's  Recent Labs Lab 06/29/15 1014  APTT 38*  INR 1.41   Sepsis Markers No results for input(s): LATICACIDVEN, PROCALCITON, O2SATVEN in the last 168 hours.  ABG No results for input(s): PHART, PCO2ART, PO2ART in the last 168 hours.  Liver Enzymes  Recent Labs Lab 06/29/15 1014  AST 20  ALT 16  ALKPHOS 76  BILITOT 0.4  ALBUMIN 2.2*   Cardiac Enzymes No results for input(s): TROPONINI, PROBNP in the last 168 hours.  Glucose  Recent Labs Lab 06/24/15 2202 06/25/15 0901 06/25/15 1242 06/25/15 1701 06/25/15 2141 06/29/15 0956  GLUCAP 118* 142* 94 94 179* 154*   Imaging Ct Angio Head W/cm &/or Wo Cm  06/29/2015  CLINICAL DATA:  60 year 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 Initial encounter. EXAM: CT ANGIOGRAPHY HEAD AND NECK TECHNIQUE: Multidetector CT imaging of the head and neck was performed using the standard protocol during bolus administration of intravenous contrast. Multiplanar CT image reconstructions and MIPs were obtained to evaluate the vascular anatomy. Carotid stenosis measurements (when applicable) are obtained utilizing NASCET criteria, using the distal internal carotid diameter as the denominator. CONTRAST:  50 mL Omnipaque 350 COMPARISON:  Head CT without contrast 1001 hours today. FINDINGS: CTA NECK Skeleton: Intermittent dental periapical lucency. No acute osseous abnormality identified. Visualized paranasal sinuses and mastoids are clear. Other neck: Negative lung apices other than dependent atelectasis. No superior mediastinal lymphadenopathy. Thyroid, larynx, pharynx, parapharyngeal spaces, retropharyngeal space, sublingual space, submandibular glands, and parotid glands are within normal limits. No cervical lymphadenopathy. Aortic arch: 3 vessel arch configuration. Minimal distal arch calcified plaque.  No great vessel origin stenosis. Right carotid system: Negative right CCA and right carotid bifurcation. Moderately tortuous proximal right ICA with a mildly kinked appearance just past the bulb. Otherwise negative cervical right ICA. Left carotid system: Negative left CCA. Soft plaque in the left ICA origin and bulb less than 50 % stenosis with respect to the distal vessel at the distal bulb. Tortuous cervical left ICA otherwise. Vertebral arteries:No proximal right subclavian artery stenosis. Normal right vertebral artery origin. Mildly dominant right vertebral artery. Soft plaque in the proximal left subclavian artery just proximal to the left vertebral artery origin with less than 50 % stenosis with respect to the distal vessel. The left vertebral artery origin appears normal. Mildly non dominant left vertebral artery appears normal to the skullbase. CTA HEAD Posterior circulation: Mildly dominant distal right vertebral artery. Normal PICA origins. No distal vertebral stenosis. Patent vertebrobasilar junction. Patent basilar artery without stenosis. SCA and left PCA origins are normal, fetal type right PCA origin. PCA branches are within normal limits. Anterior circulation: Both ICA siphons are patent without stenosis. Minimal calcified plaque in the right cavernous  segment. Normal right posterior communicating artery origin. The left ICA terminus is normal. Filling defect at the right ICA terminus, MCA and ACA origin best seen on series 505, image 17. 7-8 mm segment of the right MCA M1 segment is nonenhancing, but with collateral enhancement at the right MCA bifurcation and sylvian division. The right ACA A1 segment is non dominant. It remains patent likely supplied from the left. Normal left MCA and ACA origins. Dominant left A1. Anterior communicating artery and bilateral ACA branches are within normal limits. Left MCA M1 segment, bifurcation, and left MCA branches are within normal limits. Venous sinuses:  Patent. Anatomic variants: Dominant left ACA A1 segment. Fetal type right PCA origin. Mildly dominant right vertebral artery. Right hemisphere gray-white matter differentiation appears stableand normal compared to 1001 hours today. IMPRESSION: 1. Positive for emergent large vessel occlusion: Occluded right ICA terminus, right MCA and right ACA origins. 7-8 mm occluded segment of the right M1 segment but good right MCA M2 and distal collaterals. Also, the left A1 is dominant such that bilateral A2 and distal ACA flow appears symmetric. Finding of ELVO at the [facility] origin reviewed in person with Dr. Roland Rack on 06/29/2015 at 1105 hours. 2. Mildly to moderately tortuous cervical ICAs. Mild plaque at the right ICA origin and bulb without stenosis. Minimal calcified right ICA siphon plaque. Moderate plaque at the distal left ICA bulb but without hemodynamically significant stenosis. 3. Proximal left subclavian artery soft plaque without significant stenosis. Negative posterior circulation. 4. Grossly stable CT appearance of the brain since 1001 hours. Electronically Signed   By: Genevie Ann M.D.   On: 06/29/2015 11:28   Ct Head Wo Contrast  06/29/2015  CLINICAL DATA:  Code stroke.  Left-sided weakness and slurred speech EXAM: CT HEAD WITHOUT CONTRAST TECHNIQUE: Contiguous axial images were obtained from the base of the skull through the vertex without intravenous contrast. COMPARISON:  CT head 06/17/2015 FINDINGS: Ventricle size normal. Cerebral volume normal for age. Mild hypodensity in the periventricular white matter consistent with mild chronic microvascular ischemia. Negative for acute infarct.  Negative for acute hemorrhage or mass. Calvarium intact. IMPRESSION: Mild chronic microvascular ischemia.  No acute abnormality Critical Value/emergent results were called by telephone at the time of interpretation on 06/29/2015 at 10:13 am to Dr. Leonel Ramsay , who verbally acknowledged these results. Electronically  Signed   By: Franchot Gallo M.D.   On: 06/29/2015 10:13   Ct Angio Neck W/cm &/or Wo/cm  06/29/2015  CLINICAL DATA:  60 year 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 Initial encounter. EXAM: CT ANGIOGRAPHY HEAD AND NECK TECHNIQUE: Multidetector CT imaging of the head and neck was performed using the standard protocol during bolus administration of intravenous contrast. Multiplanar CT image reconstructions and MIPs were obtained to evaluate the vascular anatomy. Carotid stenosis measurements (when applicable) are obtained utilizing NASCET criteria, using the distal internal carotid diameter as the denominator. CONTRAST:  50 mL Omnipaque 350 COMPARISON:  Head CT without contrast 1001 hours today. FINDINGS: CTA NECK Skeleton: Intermittent dental periapical lucency. No acute osseous abnormality identified. Visualized paranasal sinuses and mastoids are clear. Other neck: Negative lung apices other than dependent atelectasis. No superior mediastinal lymphadenopathy. Thyroid, larynx, pharynx, parapharyngeal spaces, retropharyngeal space, sublingual space, submandibular glands, and parotid glands are within normal limits. No cervical lymphadenopathy. Aortic arch: 3 vessel arch configuration. Minimal distal arch calcified plaque. No great vessel origin stenosis. Right carotid system: Negative right CCA and right carotid bifurcation. Moderately tortuous proximal right ICA  with a mildly kinked appearance just past the bulb. Otherwise negative cervical right ICA. Left carotid system: Negative left CCA. Soft plaque in the left ICA origin and bulb less than 50 % stenosis with respect to the distal vessel at the distal bulb. Tortuous cervical left ICA otherwise. Vertebral arteries:No proximal right subclavian artery stenosis. Normal right vertebral artery origin. Mildly dominant right vertebral artery. Soft plaque in the proximal left subclavian artery just proximal to the left vertebral artery  origin with less than 50 % stenosis with respect to the distal vessel. The left vertebral artery origin appears normal. Mildly non dominant left vertebral artery appears normal to the skullbase. CTA HEAD Posterior circulation: Mildly dominant distal right vertebral artery. Normal PICA origins. No distal vertebral stenosis. Patent vertebrobasilar junction. Patent basilar artery without stenosis. SCA and left PCA origins are normal, fetal type right PCA origin. PCA branches are within normal limits. Anterior circulation: Both ICA siphons are patent without stenosis. Minimal calcified plaque in the right cavernous segment. Normal right posterior communicating artery origin. The left ICA terminus is normal. Filling defect at the right ICA terminus, MCA and ACA origin best seen on series 505, image 17. 7-8 mm segment of the right MCA M1 segment is nonenhancing, but with collateral enhancement at the right MCA bifurcation and sylvian division. The right ACA A1 segment is non dominant. It remains patent likely supplied from the left. Normal left MCA and ACA origins. Dominant left A1. Anterior communicating artery and bilateral ACA branches are within normal limits. Left MCA M1 segment, bifurcation, and left MCA branches are within normal limits. Venous sinuses: Patent. Anatomic variants: Dominant left ACA A1 segment. Fetal type right PCA origin. Mildly dominant right vertebral artery. Right hemisphere gray-white matter differentiation appears stableand normal compared to 1001 hours today. IMPRESSION: 1. Positive for emergent large vessel occlusion: Occluded right ICA terminus, right MCA and right ACA origins. 7-8 mm occluded segment of the right M1 segment but good right MCA M2 and distal collaterals. Also, the left A1 is dominant such that bilateral A2 and distal ACA flow appears symmetric. Finding of ELVO at the [facility] origin reviewed in person with Dr. Roland Rack on 06/29/2015 at 1105 hours. 2. Mildly to moderately  tortuous cervical ICAs. Mild plaque at the right ICA origin and bulb without stenosis. Minimal calcified right ICA siphon plaque. Moderate plaque at the distal left ICA bulb but without hemodynamically significant stenosis. 3. Proximal left subclavian artery soft plaque without significant stenosis. Negative posterior circulation. 4. Grossly stable CT appearance of the brain since 1001 hours. Electronically Signed   By: Genevie Ann M.D.   On: 06/29/2015 11:28   STUDIES:  CT chest 1/28 >2 cm LLL nodule  Echo 06/24/15 EF 60%, MV vegetation  CT head 2/5 >Positive for occlusion Right ICA/MCA , 7 mm occluded segment at M1 at right .   CULTURES:   ANTIBIOTICS: 2/5 Rocephin  2/5 Cipro>>>  SIGNIFICANT EVENTS: Admitted 1/22-2/2 for sepsis d/t pyelonephritis , bacteremia and endocarditis with splenic infarcts . Lung mass found on this admission.  LINES/TUBES:   DISCUSSION:  60  Yo female wth recent admission for sepsis with endocarditits on prolonged abx at discharge followed by ID with acute CVA most likely from embolization despite abx therapy. She is currently in IR for thrombectomy.    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  Smoker  M P:   Vent support  Daily WUA  Eval for daily SBT  Check ABG  OP Pet scan has been set up.   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 P:  Cont IV abx , see ID sxn  IV cipro as above.  RENAL A:  Hyponatremia   P:   Trend Bmet   GASTROINTESTINAL A:   NPO  P:   PPI  Start TF in AM if remains intubated.  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  Patient was intubated for the procedure and left intubated post procedure and sent to the ICU.  PCCM called to assist with vent management.  On exam lungs are clear to auscultation.  Patient is still paralyzed so exam is impeded.  Will continue full vent support.  Adjust vent for ABG.  F/U CXR for ETT placement.  Add cipro and reculture.  Will monitor for today and evaluate mental status in AM.  The patient is critically ill with multiple organ systems failure and requires high complexity decision making for assessment and support, frequent evaluation and titration of therapies, application of advanced monitoring technologies and extensive interpretation of multiple databases.   Critical Care Time devoted to patient care services described in this note is  35  Minutes. This time reflects time of care of this signee Dr Jennet Maduro. This critical care time does not reflect procedure time, or teaching time or supervisory time of PA/NP/Med student/Med Resident etc but could involve care discussion time.  Rush Farmer, M.D. [facility] Pulmonary/Critical Care Medicine. Pager: [phone]. After hours pager: [phone].  06/29/2015, 12:45 PM

## 2015-06-30 ENCOUNTER — Encounter (HOSPITAL_COMMUNITY): Payer: 59

## 2015-06-30 ENCOUNTER — Inpatient Hospital Stay (HOSPITAL_COMMUNITY): Payer: 59

## 2015-06-30 DIAGNOSIS — B952 Enterococcus as the cause of diseases classified elsewhere: Secondary | ICD-10-CM

## 2015-06-30 DIAGNOSIS — I330 Acute and subacute infective endocarditis: Secondary | ICD-10-CM

## 2015-06-30 DIAGNOSIS — J9601 Acute respiratory failure with hypoxia: Secondary | ICD-10-CM

## 2015-06-30 DIAGNOSIS — I639 Cerebral infarction, unspecified: Secondary | ICD-10-CM | POA: Insufficient documentation

## 2015-06-30 DIAGNOSIS — I959 Hypotension, unspecified: Secondary | ICD-10-CM

## 2015-06-30 DIAGNOSIS — G9340 Encephalopathy, unspecified: Secondary | ICD-10-CM | POA: Insufficient documentation

## 2015-06-30 DIAGNOSIS — R21 Rash and other nonspecific skin eruption: Secondary | ICD-10-CM

## 2015-06-30 DIAGNOSIS — I749 Embolism and thrombosis of unspecified artery: Secondary | ICD-10-CM

## 2015-06-30 DIAGNOSIS — I63131 Cerebral infarction due to embolism of right carotid artery: Secondary | ICD-10-CM

## 2015-06-30 LAB — URINALYSIS W MICROSCOPIC (NOT AT ARMC)
Bilirubin Urine: NEGATIVE
GLUCOSE, UA: NEGATIVE mg/dL
Hgb urine dipstick: NEGATIVE
Ketones, ur: NEGATIVE mg/dL
Nitrite: NEGATIVE
PH: 6.5 (ref 5.0–8.0)
Protein, ur: NEGATIVE mg/dL
RBC / HPF: NONE SEEN RBC/hpf (ref 0–5)
SPECIFIC GRAVITY, URINE: 1.017 (ref 1.005–1.030)

## 2015-06-30 LAB — LIPID PANEL
CHOL/HDL RATIO: 4 ratio
CHOLESTEROL: 64 mg/dL (ref 0–200)
HDL: 16 mg/dL — AB (ref >40)
LDL Cholesterol: 26 mg/dL (ref 0–99)
TRIGLYCERIDES: 109 mg/dL (ref <150)
VLDL: 22 mg/dL (ref 0–40)

## 2015-06-30 LAB — CBC WITH DIFFERENTIAL/PLATELET
BASOS ABS: 0 10*3/uL (ref 0.0–0.1)
BASOS PCT: 0 %
EOS PCT: 2 %
Eosinophils Absolute: 0.3 10*3/uL (ref 0.0–0.7)
HCT: 24 % — ABNORMAL LOW (ref 36.0–46.0)
Hemoglobin: 8.1 g/dL — ABNORMAL LOW (ref 12.0–15.0)
LYMPHS PCT: 13 %
Lymphs Abs: 1.9 10*3/uL (ref 0.7–4.0)
MCH: 31.2 pg (ref 26.0–34.0)
MCHC: 33.8 g/dL (ref 30.0–36.0)
MCV: 92.3 fL (ref 78.0–100.0)
MONO ABS: 1 10*3/uL (ref 0.1–1.0)
Monocytes Relative: 7 %
Neutro Abs: 11.3 10*3/uL — ABNORMAL HIGH (ref 1.7–7.7)
Neutrophils Relative %: 78 %
PLATELETS: 366 10*3/uL (ref 150–400)
RBC: 2.6 MIL/uL — ABNORMAL LOW (ref 3.87–5.11)
RDW: 14.6 % (ref 11.5–15.5)
WBC: 14.5 10*3/uL — ABNORMAL HIGH (ref 4.0–10.5)

## 2015-06-30 LAB — POCT I-STAT 4, (NA,K, GLUC, HGB,HCT)
Glucose, Bld: 172 mg/dL — ABNORMAL HIGH (ref 65–99)
HCT: 20 % — ABNORMAL LOW (ref 36.0–46.0)
Hemoglobin: 6.8 g/dL — CL (ref 12.0–15.0)
Potassium: 3.2 mmol/L — ABNORMAL LOW (ref 3.5–5.1)
Sodium: 137 mmol/L (ref 135–145)

## 2015-06-30 LAB — BLOOD GAS, ARTERIAL
Acid-base deficit: 5.1 mmol/L — ABNORMAL HIGH (ref 0.0–2.0)
Bicarbonate: 19.8 mEq/L — ABNORMAL LOW (ref 20.0–24.0)
DRAWN BY: 44166
FIO2: 0.4
MECHVT: 410 mL
O2 SAT: 95.9 %
PATIENT TEMPERATURE: 101.3
PEEP: 5 cmH2O
PH ART: 7.306 — AB (ref 7.350–7.450)
RATE: 15 resp/min
TCO2: 21 mmol/L (ref 0–100)
pCO2 arterial: 41.7 mmHg (ref 35.0–45.0)
pO2, Arterial: 95.8 mmHg (ref 80.0–100.0)

## 2015-06-30 LAB — GLUCOSE, CAPILLARY
GLUCOSE-CAPILLARY: 152 mg/dL — AB (ref 65–99)
GLUCOSE-CAPILLARY: 201 mg/dL — AB (ref 65–99)
GLUCOSE-CAPILLARY: 179 mg/dL — AB (ref 65–99)
Glucose-Capillary: 133 mg/dL — ABNORMAL HIGH (ref 65–99)
Glucose-Capillary: 155 mg/dL — ABNORMAL HIGH (ref 65–99)
Glucose-Capillary: 169 mg/dL — ABNORMAL HIGH (ref 65–99)

## 2015-06-30 LAB — BASIC METABOLIC PANEL
ANION GAP: 10 (ref 5–15)
BUN: <5 mg/dL — ABNORMAL LOW (ref 6–20)
CALCIUM: 7.5 mg/dL — AB (ref 8.9–10.3)
CO2: 21 mmol/L — ABNORMAL LOW (ref 22–32)
Chloride: 104 mmol/L (ref 101–111)
Creatinine, Ser: 0.85 mg/dL (ref 0.44–1.00)
GFR calc Af Amer: >60 mL/min (ref >60)
GLUCOSE: 148 mg/dL — AB (ref 65–99)
Potassium: 3.7 mmol/L (ref 3.5–5.1)
Sodium: 135 mmol/L (ref 135–145)

## 2015-06-30 LAB — HEMOGLOBIN AND HEMATOCRIT, BLOOD
HEMATOCRIT: 22.7 % — AB (ref 36.0–46.0)
Hemoglobin: 7.7 g/dL — ABNORMAL LOW (ref 12.0–15.0)

## 2015-06-30 LAB — PROTIME-INR
INR: 1.46 (ref 0.00–1.49)
PROTHROMBIN TIME: 17.8 s — AB (ref 11.6–15.2)

## 2015-06-30 LAB — TRANSFUSION REACTION
DAT C3: NEGATIVE
POST RXN DAT IGG: NEGATIVE

## 2015-06-30 LAB — TYPE AND SCREEN
ABO/RH(D): O POS
ANTIBODY SCREEN: NEGATIVE
UNIT DIVISION: 0
Unit division: 0

## 2015-06-30 LAB — MAGNESIUM: Magnesium: 1.6 mg/dL — ABNORMAL LOW (ref 1.7–2.4)

## 2015-06-30 LAB — SODIUM
SODIUM: 129 mmol/L — AB (ref 135–145)
Sodium: 137 mmol/L (ref 135–145)

## 2015-06-30 LAB — POCT ACTIVATED CLOTTING TIME: Activated Clotting Time: 168 seconds

## 2015-06-30 LAB — PHOSPHORUS: Phosphorus: 3.2 mg/dL (ref 2.5–4.6)

## 2015-06-30 LAB — APTT: APTT: 32 s (ref 24–37)

## 2015-06-30 MED ORDER — PHENYLEPHRINE HCL 10 MG/ML IJ SOLN
30.0000 ug/min | INTRAVENOUS | Status: DC
  Administered 2015-06-30: 140 ug/min via INTRAVENOUS
  Administered 2015-06-30: 150 ug/min via INTRAVENOUS
  Administered 2015-06-30: 130 ug/min via INTRAVENOUS
  Administered 2015-07-01: 100 ug/min via INTRAVENOUS
  Administered 2015-07-01: 160 ug/min via INTRAVENOUS
  Administered 2015-07-01: 110 ug/min via INTRAVENOUS
  Administered 2015-07-01: 160 ug/min via INTRAVENOUS
  Administered 2015-07-02: 75 ug/min via INTRAVENOUS
  Administered 2015-07-02: 100 ug/min via INTRAVENOUS
  Filled 2015-06-30 (×10): qty 4

## 2015-06-30 MED ORDER — MIDAZOLAM HCL 2 MG/2ML IJ SOLN
1.0000 mg | INTRAMUSCULAR | Status: DC | PRN
  Administered 2015-06-30 (×2): 2 mg via INTRAVENOUS
  Filled 2015-06-30 (×2): qty 2

## 2015-06-30 MED ORDER — MAGNESIUM SULFATE 2 GM/50ML IV SOLN
INTRAVENOUS | Status: AC
  Filled 2015-06-30: qty 50

## 2015-06-30 MED ORDER — MAGNESIUM SULFATE 2 GM/50ML IV SOLN
2.0000 g | Freq: Once | INTRAVENOUS | Status: AC
  Administered 2015-06-30: 2 g via INTRAVENOUS

## 2015-06-30 MED ORDER — FENTANYL CITRATE (PF) 100 MCG/2ML IJ SOLN
25.0000 ug | INTRAMUSCULAR | Status: DC | PRN
  Administered 2015-06-30 (×2): 50 ug via INTRAVENOUS
  Filled 2015-06-30: qty 2

## 2015-06-30 MED ORDER — VITAL HIGH PROTEIN PO LIQD
1000.0000 mL | ORAL | Status: DC
  Administered 2015-06-30: 1000 mL
  Administered 2015-06-30: 22:00:00
  Administered 2015-07-01: 1000 mL
  Administered 2015-07-01: 03:00:00

## 2015-06-30 MED ORDER — SODIUM CHLORIDE 3 % IV SOLN
INTRAVENOUS | Status: DC
  Administered 2015-06-30 – 2015-07-01 (×2): 50 mL/h via INTRAVENOUS
  Administered 2015-07-01: 75 mL/h via INTRAVENOUS
  Administered 2015-07-01: 50 mL/h via INTRAVENOUS
  Administered 2015-07-03: 20 mL/h via INTRAVENOUS
  Filled 2015-06-30 (×9): qty 500

## 2015-06-30 MED ORDER — LACTATED RINGERS IV BOLUS (SEPSIS)
1000.0000 mL | Freq: Once | INTRAVENOUS | Status: AC
  Administered 2015-06-30: 1000 mL via INTRAVENOUS

## 2015-06-30 MED ORDER — DEXTROSE 5 % IV SOLN
2.0000 g | Freq: Two times a day (BID) | INTRAVENOUS | Status: DC
  Administered 2015-06-30: 2 g via INTRAVENOUS
  Filled 2015-06-30 (×2): qty 2

## 2015-06-30 MED ORDER — SODIUM CHLORIDE 0.9 % IV BOLUS (SEPSIS)
1000.0000 mL | Freq: Once | INTRAVENOUS | Status: AC
  Administered 2015-06-30: 1000 mL via INTRAVENOUS

## 2015-06-30 MED ORDER — PHENYLEPHRINE HCL 10 MG/ML IJ SOLN
30.0000 ug/min | INTRAVENOUS | Status: DC
  Administered 2015-06-30 (×7): 130 ug/min via INTRAVENOUS
  Filled 2015-06-30 (×7): qty 1

## 2015-06-30 MED ORDER — ADULT MULTIVITAMIN W/MINERALS CH
1.0000 | ORAL_TABLET | Freq: Every day | ORAL | Status: DC
  Administered 2015-07-01 – 2015-07-07 (×5): 1
  Filled 2015-06-30 (×5): qty 1

## 2015-06-30 MED ORDER — DIPHENHYDRAMINE HCL 50 MG/ML IJ SOLN
12.5000 mg | Freq: Four times a day (QID) | INTRAMUSCULAR | Status: DC | PRN
  Administered 2015-06-30 – 2015-07-07 (×18): 12.5 mg via INTRAVENOUS
  Filled 2015-06-30 (×19): qty 1

## 2015-06-30 MED ORDER — SODIUM CHLORIDE 0.9% FLUSH
10.0000 mL | Freq: Two times a day (BID) | INTRAVENOUS | Status: DC
  Administered 2015-06-30: 30 mL
  Administered 2015-07-01 – 2015-07-06 (×8): 10 mL

## 2015-06-30 MED ORDER — METHYLPREDNISOLONE SODIUM SUCC 125 MG IJ SOLR
INTRAMUSCULAR | Status: AC
  Filled 2015-06-30: qty 2

## 2015-06-30 MED ORDER — FENTANYL CITRATE (PF) 100 MCG/2ML IJ SOLN
25.0000 ug | INTRAMUSCULAR | Status: DC | PRN
  Administered 2015-06-30 – 2015-07-01 (×4): 50 ug via INTRAVENOUS
  Administered 2015-07-02: 25 ug via INTRAVENOUS
  Administered 2015-07-02 – 2015-07-03 (×3): 50 ug via INTRAVENOUS
  Administered 2015-07-03 – 2015-07-05 (×7): 25 ug via INTRAVENOUS
  Filled 2015-06-30 (×16): qty 2

## 2015-06-30 MED ORDER — METHYLPREDNISOLONE SODIUM SUCC 125 MG IJ SOLR
60.0000 mg | Freq: Once | INTRAMUSCULAR | Status: AC
  Administered 2015-06-30: 60 mg via INTRAVENOUS

## 2015-06-30 MED ORDER — SODIUM CHLORIDE 0.9% FLUSH
10.0000 mL | INTRAVENOUS | Status: DC | PRN
  Administered 2015-07-05 – 2015-07-06 (×3): 20 mL
  Administered 2015-07-07: 10 mL
  Filled 2015-06-30 (×4): qty 40

## 2015-06-30 MED ORDER — SODIUM CHLORIDE 0.9 % IV SOLN
500.0000 mg | Freq: Four times a day (QID) | INTRAVENOUS | Status: DC
  Administered 2015-06-30 – 2015-07-05 (×20): 500 mg via INTRAVENOUS
  Filled 2015-06-30 (×26): qty 500

## 2015-06-30 MED ORDER — MIDAZOLAM HCL 2 MG/2ML IJ SOLN
1.0000 mg | INTRAMUSCULAR | Status: DC | PRN
  Administered 2015-06-30: 2 mg via INTRAVENOUS
  Administered 2015-06-30: 1 mg via INTRAVENOUS
  Administered 2015-06-30 – 2015-07-02 (×3): 2 mg via INTRAVENOUS
  Filled 2015-06-30 (×5): qty 2

## 2015-06-30 NOTE — Progress Notes (Signed)
STROKE TEAM PROGRESS NOTE   HISTORY OF PRESENT ILLNESS Becky Patterson is a 60 y.o. female history of infective endocarditis that was treated with discharge on 2/2, continuing on antibiotics at home. She was being treated with fluoroquinolone. She was doing well until today 06/29/2015 when she had sudden onset of left-sided weakness that started around 6:30 AM (LKW). She woke up normal, with clear speech and no weakness and then developed slurred speech and hand weakness. She has progressively declined since that time and is currently having significant left-sided weakness as well as neglect. She was brought into the emergency room as bilateral arm weakness, but once here was recognized that it was left arm weakness and a code stroke was activated. She went for a CT, and once was recognized that she had infective endocarditis TPA was considered contraindicated. She was taken back for a CT angiogram which did show an M1 occlusion with some collateralization. She was hypotensive and given a total of 1.5 L to try and assist her blood pressure. Taken to interventional radiology per Dr. Estanislado Pandy where he attempted revascularization of the right MCA occlusion with Solitare and IA Integrelin. she did not have significant medical follow-up prior to her most recent hospitalization. She was diagnosed with diabetes during recent hospitalization. She was admitted to the neuro ICU for further evaluation and treatment.   SUBJECTIVE (INTERVAL HISTORY) Her husband and sister are at the bedside.  She remains intubated. Is following commands. They report she has not been to the hospital in 30 years until last week. Now she has just been sick ever since.    OBJECTIVE Temp:  [97.9 F (36.6 C)-101.5 F (38.6 C)] 99.1 F (37.3 C) (02/06 0800) Pulse Rate:  [78-107] 82 (02/06 0814) Cardiac Rhythm:  [-] Normal sinus rhythm (02/06 0600) Resp:  [12-28] 24 (02/06 0814) BP: (86-125)/(45-83) 125/65 mmHg (02/06 0814) SpO2:   [95 %-100 %] 100 % (02/06 0814) Arterial Line BP: (93-155)/(53-92) 116/67 mmHg (02/06 0800) FiO2 (%):  [40 %-50 %] 40 % (02/06 0814) Weight:  [87.2 kg (192 lb 3.9 oz)] 87.2 kg (192 lb 3.9 oz) (02/05 1553)  CBC:   Recent Labs Lab 06/29/15 1014 06/30/15 0002 06/30/15 0430  WBC 15.0*  --  14.5*  NEUTROABS 11.9*  --  11.3*  HGB 8.7*  9.5* 7.7* 8.1*  HCT 25.9*  28.0* 22.7* 24.0*  MCV 92.8  --  92.3  PLT 390  --  601    Basic Metabolic Panel:   Recent Labs Lab 06/29/15 1014 06/30/15 0430  NA 130*  132* 135  K 3.9  3.8 3.7  CL 94*  96* 104  CO2 24 21*  GLUCOSE 182*  176* 148*  BUN 8  8 <5*  CREATININE 1.04*  0.90 0.85  CALCIUM 8.4* 7.5*  MG  --  1.6*  PHOS  --  3.2    Lipid Panel:     Component Value Date/Time   CHOL 64 06/30/2015 0430   TRIG 109 06/30/2015 0430   HDL 16* 06/30/2015 0430   CHOLHDL 4.0 06/30/2015 0430   VLDL 22 06/30/2015 0430   LDLCALC 26 06/30/2015 0430   HgbA1c:  Lab Results  Component Value Date   HGBA1C 6.9* 06/17/2015   Urine Drug Screen:     Component Value Date/Time   LABOPIA NONE DETECTED 06/29/2015 1048   COCAINSCRNUR NONE DETECTED 06/29/2015 1048   LABBENZ NONE DETECTED 06/29/2015 1048   AMPHETMU NONE DETECTED 06/29/2015 1048   THCU POSITIVE* 06/29/2015 1048  LABBARB NONE DETECTED 06/29/2015 1048      IMAGING  Ct Head Wo Contrast 06/29/2015   No adverse features status post Neurointervention for emergent right ICA terminus occlusion. No acute or developing right hemisphere infarct identified.  06/29/2015   Mild chronic microvascular ischemia.  No acute abnormality   Ct Angio Head & Neck W/cm &/or Wo/cm 06/29/2015  1. Positive for emergent large vessel occlusion: Occluded right ICA terminus, right MCA and right ACA origins. 7-8 mm occluded segment of the right M1 segment but good right MCA M2 and distal collaterals. Also, the left A1 is dominant such that bilateral A2 and distal ACA flow appears symmetric.  2. Mildly to  moderately tortuous cervical ICAs. Mild plaque at the right ICA origin and bulb without stenosis. Minimal calcified right ICA siphon plaque. Moderate plaque at the distal left ICA bulb but without hemodynamically significant stenosis. 3. Proximal left subclavian artery soft plaque without significant stenosis. Negative posterior circulation. 4. Grossly stable CT appearance of the brain since 1001 hours.   Cerebral angiogram  06/29/2015 bilateral CCA and right vertebral artery arteriograms followed by attempted right ICA terminus and right MCA occlusion using 3 passes of the Solitaire [place] stent retrieval device and 8 mg of superselective E Integrilin. Robust crass filling from left ICA versus ACom and R PCA leptomeningeals.  Dg Chest Port 1 View 06/29/2015  Endotracheal tube is 6 cm above the carina, recommend advancing 2 cm Right upper lobe collapse     PHYSICAL EXAM Elderly caucasian lady who is intubated. . Afebrile. Head is nontraumatic. Neck is supple without bruit.    Cardiac exam no murmur or gallop. Lungs are clear to auscultation. Distal pulses are well felt. She has a diffuse maculopapular rash which is new Neurological Exam :  Drowsy but can be easily aroused. Right gaze preference unable to look to the left past midline. Blinks to threat on the right but not on the left. Pupils irregular but reactive. Fundi could not be visualized. Follows simple midline and commands on the right side consistently. No left hemi-neglect. Able to recognize her own left side. Able to point towards family members. Normal strength in the right. Left hemiplegia with only trace withdrawal to painful stimuli in the legs and none in the arm. Diminished on the left side. Right plantar downgoing left upgoing. ASSESSMENT/PLAN Becky Patterson is a 60 y.o. female with history of infective endocarditis presenting with left arm weakness. She did not receive IV t-PA due to recent endocarditis. She was taken to IR where  there was attempted revascularization of the right MCA occlusion using mechanical thrombectomy and intra-arterial Integrilin.   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 Get a CT if MRI is unable to be done  TEE 06/24/2015 MV endocarditis, vegetation both leaflets but trivial MR  LDL 26, at goal < 70  HgbA1c 6.9 in Jan  SCDs for VTE prophylaxis Diet NPO time specified  No antithrombotic prior to admission, now on No antithrombotic due to endocarditis  D/c Sheath this am  Ongoing aggressive stroke risk factor management  Consider 3% with neuro  worsening, review MRI  Therapy recommendations:  pending   Disposition:  pending   Acute Respiratory Failure  intubated for neuro intervention  Weaning underway  MV Endocarditis with splenic infarcts  dx 05/2015 with sepsis from enterococcus UTI, pyelo, bacteremia   on Cipro 750 mg bid since 06/25/15 (planned total 6 weeks treatment with fluoroquinolone, had been on rocephin 1/23 - 1/30 (no abx 1/31))  new CTX 2 mg IV q 24h this admission  WBD 15.0-> 14.5  Have requested PICC line placement  Planned OP followup - by ID and Cardiology to monitor vegetation  Maculopapular Rash  Over thorax  ? Transfusion reaction vs abx reaction  On Rocephin and Cipro currently  Continue cephalosporin for now  Acute Blood Loss Anemia Transfusion Reaction Anemia of Chronic disease  Hgb 9.5 on admission, down to 7.7  Received 1 unit. 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   Diabetes  HgbA1c 6.9, at goal < 7.0  Other Stroke Risk Factors  smoker   THC, UDS positive this admission  Obesity, Body mass index is 35.15 kg/(m^2).   Other Active Problems  Hyponatremia  Hospital day # [place] for Pager information 06/30/2015 10:21 AM  I have personally examined this patient, reviewed notes, independently viewed imaging studies, participated in medical decision making and plan of care. I have made any additions or clarifications directly to the above note. Agree with note above. She presented with a large right brain infarct due to terminal ICA occlusion unfortunately she did not have get successful revascularization. Follow-up CT scan likely to show cerebral edema and she is at risk for significant neurological worsening, brain herniation with cytotoxic edema and will need aggressive management to control cerebral edema and prevent neurological worsening. I had a long discussion at the bedside with the patient's family and answered questions. There would like aggressive care. We will also consult infectious disease to help manage endocarditis and appreciate pulmonary critical care help for management of other critical care issues This patient is critically ill and at significant risk of neurological worsening, death and care requires constant monitoring of vital signs, hemodynamics,respiratory and cardiac monitoring, extensive review of multiple databases, frequent neurological assessment, discussion with family, other specialists and medical decision making of high complexity.I have made any additions or clarifications directly to the above note.This critical care time does not reflect  procedure time, or teaching time or supervisory time of PA/NP/Med Resident etc but could involve care discussion time.  I spent 60 minutes of neurocritical care time  in the care of  this patient.     Antony Contras, MD Medical Director [facility] Pager: [phone] 06/30/2015 4:10 PM    To contact Stroke Continuity provider, please refer to [email or web address] After hours, contact General Neurology

## 2015-06-30 NOTE — Progress Notes (Signed)
Dr. Oletta Darter notified patient's BP below neurology recs, that neo need increasing and through a PIV, and patient's temperature after 1 unit of PRBC. Order received to hold second unit of PRBC, give tylenol and see if temperature came down before giving. To call if neo exceeded 155mg.

## 2015-06-30 NOTE — Progress Notes (Signed)
Peripherally Inserted Central Catheter/Midline Placement  The IV Nurse has discussed with the patient and/or persons authorized to consent for the patient, the purpose of this procedure and the potential benefits and risks involved with this procedure.  The benefits include less needle sticks, lab draws from the catheter and patient may be discharged home with the catheter.  Risks include, but not limited to, infection, bleeding, blood clot (thrombus formation), and puncture of an artery; nerve damage and irregular heat beat.  Alternatives to this procedure were also discussed.  Consent signed by husband.  PICC/Midline Placement Documentation  PICC Triple Lumen 21/03/12 PICC Right Basilic 42 cm 1 cm (Active)  Indication for Insertion or Continuance of Line Vasoactive infusions;Administration of hyperosmolar/irritating solutions (i.e. TPN, Vancomycin, etc.) 06/30/2015  2:10 PM  Exposed Catheter (cm) 1 cm 06/30/2015  2:10 PM  Site Assessment Clean;Dry;Intact 06/30/2015  2:10 PM  Lumen #1 Status Flushed;Saline locked;Blood return noted 06/30/2015  2:10 PM  Lumen #2 Status Flushed;Saline locked;Blood return noted 06/30/2015  2:10 PM  Lumen #3 Status Flushed;Saline locked;Blood return noted 06/30/2015  2:10 PM  Dressing Type Transparent 06/30/2015  2:10 PM  Dressing Status Clean;Dry;Intact;Antimicrobial disc in place 06/30/2015  2:10 PM  Dressing Intervention New dressing 06/30/2015  2:10 PM  Dressing Change Due 07/07/15 06/30/2015  2:10 PM       Gordan Payment 06/30/2015, 2:12 PM

## 2015-06-30 NOTE — Progress Notes (Signed)
PT Cancellation Note  Patient Details Name: Becky Patterson MRN: [id number] DOB: [birth year]   Cancelled Treatment:    Reason Eval/Treat Not Completed: Patient not medically ready   [name] 06/30/2015, 8:35 AM  [name], PT [phone] 06/30/2015

## 2015-06-30 NOTE — Progress Notes (Signed)
RFA 46f sheath removed using VPad and and manual pressure at 950am.  Hemostasis obtained at 1020am.  Groin site soft and intact.  No hematoma.  Site reviewed with Susannah RN and distal pulses dopplered.  Pressure dressing applied and sandbag to site.

## 2015-06-30 NOTE — Progress Notes (Signed)
PULMONARY / CRITICAL CARE MEDICINE   Name: EMILLIA WEATHERLY MRN: [id number] DOB: 1956-01-16    ADMISSION DATE:  06/29/2015 CONSULTATION DATE:  2/5  REFERRING MD:  Neuro -Kirpatrick   CHIEF COMPLAINT:  Vent management /CVA   HISTORY OF PRESENT ILLNESS:   Becky Patterson is a 60 y.o. female history of infective endocarditis that was treated with discharge on 2/2, continuing on antibiotics at home. She was being treated with fluoroquinolone. She was doing well until today when she had sudden onset of left-sided weakness that started around 6:30 AM. She woke up normal, with clear speech and no weakness and then developed slurred speech and hand weakness. She has progressively declined since that time and is currently having significant left-sided weakness as well as neglect. She was brought into the emergency room as bilateral arm weakness, but once here was recognized that it was left arm weakness and a code stroke was activated. She went for a CT, and once was recognized that she had infective endocarditis TPA was considered contraindicated. She was taken back for a CT angiogram which did show an M1 occlusion with some collateralization.  She was hypotensive and given a total of 1.5 L to try and assist her blood pressure. Being taken to interventional radiology per Dr. Estanislado Pandy.  SUBJECTIVE: No events overnight, sedate.  VITAL SIGNS: BP 144/58 mmHg  Pulse 93  Temp(Src) 99.1 F (37.3 C) (Axillary)  Resp 25  Ht '5\' 2"'$  (1.575 m)  Wt 87.2 kg (192 lb 3.9 oz)  BMI 35.15 kg/m2  SpO2 100%  HEMODYNAMICS:    VENTILATOR SETTINGS: Vent Mode:  [-] CPAP FiO2 (%):  [40 %-50 %] 40 % Set Rate:  [15 bmp] 15 bmp Vt Set:  [410 mL] 410 mL PEEP:  [5 cmH20] 5 cmH20 Pressure Support:  [5 cmH20-8 cmH20] 8 cmH20 Plateau Pressure:  [10 cmH20-16 cmH20] 13 cmH20  INTAKE / OUTPUT: I/O last 3 completed shifts: In: 9174.5 [I.V.:7218.3; Blood:331.3; IV Piggyback:1625] Out: 5784 [Urine:4665]  PHYSICAL  EXAMINATION: General: Chronically ill appearing female, sedated and ventilated. Neuro:  Sedated, ventilated and paralyzed, unable to examine. HEENT:  [place]/AT, PERRL, EOM-I and MMM. Cardiovascular:  RRR, NL S1/S2, -M/R/G. Lungs:  CTA bilaterally. Abdomen:  Soft, NT, ND and +BS. Musculoskeletal:  -edema and -tenderness. Skin:  Scattered bruises.  LABS:  BMET  Recent Labs Lab 06/24/15 0545 06/29/15 1014 06/30/15 0430  NA 134* 130*  132* 135  K 4.2 3.9  3.8 3.7  CL 98* 94*  96* 104  CO2 25 24 21*  BUN '6 8  8 '$ <5*  CREATININE 0.93 1.04*  0.90 0.85  GLUCOSE 133* 182*  176* 148*   Electrolytes  Recent Labs Lab 06/24/15 0545 06/29/15 1014 06/30/15 0430  CALCIUM 8.4* 8.4* 7.5*  MG  --   --  1.6*  PHOS  --   --  3.2   CBC  Recent Labs Lab 06/26/15 0701 06/29/15 1014 06/30/15 0002 06/30/15 0430  WBC 16.2* 15.0*  --  14.5*  HGB 8.5* 8.7*  9.5* 7.7* 8.1*  HCT 25.2* 25.9*  28.0* 22.7* 24.0*  PLT 386 390  --  366   Coag's  Recent Labs Lab 06/29/15 1014 06/30/15 0002  APTT 38* 32  INR 1.41 1.46   Sepsis Markers No results for input(s): LATICACIDVEN, PROCALCITON, O2SATVEN in the last 168 hours.  ABG  Recent Labs Lab 06/30/15 0435  PHART 7.306*  PCO2ART 41.7  PO2ART 95.8   Liver Enzymes  Recent Labs Lab  06/29/15 1014  AST 20  ALT 16  ALKPHOS 76  BILITOT 0.4  ALBUMIN 2.2*   Cardiac Enzymes No results for input(s): TROPONINI, PROBNP in the last 168 hours.  Glucose  Recent Labs Lab 06/29/15 0956 06/29/15 1724 06/29/15 2041 06/29/15 2356 06/30/15 0439 06/30/15 0744  GLUCAP 154* 127* 165* 143* 133* 152*   Imaging Ct Angio Head W/cm &/or Wo Cm  06/29/2015  CLINICAL DATA:  60 year 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 Initial encounter. EXAM: CT ANGIOGRAPHY HEAD AND NECK TECHNIQUE: Multidetector CT imaging of the head and neck was performed using the standard protocol during bolus administration of  intravenous contrast. Multiplanar CT image reconstructions and MIPs were obtained to evaluate the vascular anatomy. Carotid stenosis measurements (when applicable) are obtained utilizing NASCET criteria, using the distal internal carotid diameter as the denominator. CONTRAST:  50 mL Omnipaque 350 COMPARISON:  Head CT without contrast 1001 hours today. FINDINGS: CTA NECK Skeleton: Intermittent dental periapical lucency. No acute osseous abnormality identified. Visualized paranasal sinuses and mastoids are clear. Other neck: Negative lung apices other than dependent atelectasis. No superior mediastinal lymphadenopathy. Thyroid, larynx, pharynx, parapharyngeal spaces, retropharyngeal space, sublingual space, submandibular glands, and parotid glands are within normal limits. No cervical lymphadenopathy. Aortic arch: 3 vessel arch configuration. Minimal distal arch calcified plaque. No great vessel origin stenosis. Right carotid system: Negative right CCA and right carotid bifurcation. Moderately tortuous proximal right ICA with a mildly kinked appearance just past the bulb. Otherwise negative cervical right ICA. Left carotid system: Negative left CCA. Soft plaque in the left ICA origin and bulb less than 50 % stenosis with respect to the distal vessel at the distal bulb. Tortuous cervical left ICA otherwise. Vertebral arteries:No proximal right subclavian artery stenosis. Normal right vertebral artery origin. Mildly dominant right vertebral artery. Soft plaque in the proximal left subclavian artery just proximal to the left vertebral artery origin with less than 50 % stenosis with respect to the distal vessel. The left vertebral artery origin appears normal. Mildly non dominant left vertebral artery appears normal to the skullbase. CTA HEAD Posterior circulation: Mildly dominant distal right vertebral artery. Normal PICA origins. No distal vertebral stenosis. Patent vertebrobasilar junction. Patent basilar artery  without stenosis. SCA and left PCA origins are normal, fetal type right PCA origin. PCA branches are within normal limits. Anterior circulation: Both ICA siphons are patent without stenosis. Minimal calcified plaque in the right cavernous segment. Normal right posterior communicating artery origin. The left ICA terminus is normal. Filling defect at the right ICA terminus, MCA and ACA origin best seen on series 505, image 17. 7-8 mm segment of the right MCA M1 segment is nonenhancing, but with collateral enhancement at the right MCA bifurcation and sylvian division. The right ACA A1 segment is non dominant. It remains patent likely supplied from the left. Normal left MCA and ACA origins. Dominant left A1. Anterior communicating artery and bilateral ACA branches are within normal limits. Left MCA M1 segment, bifurcation, and left MCA branches are within normal limits. Venous sinuses: Patent. Anatomic variants: Dominant left ACA A1 segment. Fetal type right PCA origin. Mildly dominant right vertebral artery. Right hemisphere gray-white matter differentiation appears stableand normal compared to 1001 hours today. IMPRESSION: 1. Positive for emergent large vessel occlusion: Occluded right ICA terminus, right MCA and right ACA origins. 7-8 mm occluded segment of the right M1 segment but good right MCA M2 and distal collaterals. Also, the left A1 is dominant such that bilateral A2 and  distal ACA flow appears symmetric. Finding of ELVO at the [facility] origin reviewed in person with Dr. Roland Rack on 06/29/2015 at 1105 hours. 2. Mildly to moderately tortuous cervical ICAs. Mild plaque at the right ICA origin and bulb without stenosis. Minimal calcified right ICA siphon plaque. Moderate plaque at the distal left ICA bulb but without hemodynamically significant stenosis. 3. Proximal left subclavian artery soft plaque without significant stenosis. Negative posterior circulation. 4. Grossly stable CT appearance of the brain  since 1001 hours. Electronically Signed   By: Genevie Ann M.D.   On: 06/29/2015 11:28   Ct Head Wo Contrast  06/29/2015  CLINICAL DATA:  60 year old female status post neurointervention, clot retrieval for emergent right ICA terminus occlusion in the setting of infectious endocarditis. Initial encounter. EXAM: CT HEAD WITHOUT CONTRAST TECHNIQUE: Contiguous axial images were obtained from the base of the skull through the vertex without intravenous contrast. COMPARISON:  Cta head and neck 1039 hours today and earlier. FINDINGS: Fluid in the pharynx. Mild paranasal sinus mucosal thickening. Stable visualized osseous structures. Stable orbit and scalp soft tissues. Residual intravascular contrast. Mild contrast staining along the course of the right MCA posterior division in the right temporal lobe and posterior insula. No CT changes of acute cortically based infarct are identified. No acute intracranial hemorrhage identified. No midline shift, mass effect, or evidence of intracranial mass lesion. No ventriculomegaly. Stable basilar cisterns. IMPRESSION: No adverse features status post Neurointervention for emergent right ICA terminus occlusion. No acute or developing right hemisphere infarct identified. Electronically Signed   By: Genevie Ann M.D.   On: 06/29/2015 15:55   Ct Angio Neck W/cm &/or Wo/cm  06/29/2015  CLINICAL DATA:  60 year 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 Initial encounter. EXAM: CT ANGIOGRAPHY HEAD AND NECK TECHNIQUE: Multidetector CT imaging of the head and neck was performed using the standard protocol during bolus administration of intravenous contrast. Multiplanar CT image reconstructions and MIPs were obtained to evaluate the vascular anatomy. Carotid stenosis measurements (when applicable) are obtained utilizing NASCET criteria, using the distal internal carotid diameter as the denominator. CONTRAST:  50 mL Omnipaque 350 COMPARISON:  Head CT without contrast  1001 hours today. FINDINGS: CTA NECK Skeleton: Intermittent dental periapical lucency. No acute osseous abnormality identified. Visualized paranasal sinuses and mastoids are clear. Other neck: Negative lung apices other than dependent atelectasis. No superior mediastinal lymphadenopathy. Thyroid, larynx, pharynx, parapharyngeal spaces, retropharyngeal space, sublingual space, submandibular glands, and parotid glands are within normal limits. No cervical lymphadenopathy. Aortic arch: 3 vessel arch configuration. Minimal distal arch calcified plaque. No great vessel origin stenosis. Right carotid system: Negative right CCA and right carotid bifurcation. Moderately tortuous proximal right ICA with a mildly kinked appearance just past the bulb. Otherwise negative cervical right ICA. Left carotid system: Negative left CCA. Soft plaque in the left ICA origin and bulb less than 50 % stenosis with respect to the distal vessel at the distal bulb. Tortuous cervical left ICA otherwise. Vertebral arteries:No proximal right subclavian artery stenosis. Normal right vertebral artery origin. Mildly dominant right vertebral artery. Soft plaque in the proximal left subclavian artery just proximal to the left vertebral artery origin with less than 50 % stenosis with respect to the distal vessel. The left vertebral artery origin appears normal. Mildly non dominant left vertebral artery appears normal to the skullbase. CTA HEAD Posterior circulation: Mildly dominant distal right vertebral artery. Normal PICA origins. No distal vertebral stenosis. Patent vertebrobasilar junction. Patent basilar artery without stenosis. SCA  and left PCA origins are normal, fetal type right PCA origin. PCA branches are within normal limits. Anterior circulation: Both ICA siphons are patent without stenosis. Minimal calcified plaque in the right cavernous segment. Normal right posterior communicating artery origin. The left ICA terminus is normal. Filling  defect at the right ICA terminus, MCA and ACA origin best seen on series 505, image 17. 7-8 mm segment of the right MCA M1 segment is nonenhancing, but with collateral enhancement at the right MCA bifurcation and sylvian division. The right ACA A1 segment is non dominant. It remains patent likely supplied from the left. Normal left MCA and ACA origins. Dominant left A1. Anterior communicating artery and bilateral ACA branches are within normal limits. Left MCA M1 segment, bifurcation, and left MCA branches are within normal limits. Venous sinuses: Patent. Anatomic variants: Dominant left ACA A1 segment. Fetal type right PCA origin. Mildly dominant right vertebral artery. Right hemisphere gray-white matter differentiation appears stableand normal compared to 1001 hours today. IMPRESSION: 1. Positive for emergent large vessel occlusion: Occluded right ICA terminus, right MCA and right ACA origins. 7-8 mm occluded segment of the right M1 segment but good right MCA M2 and distal collaterals. Also, the left A1 is dominant such that bilateral A2 and distal ACA flow appears symmetric. Finding of ELVO at the [facility] origin reviewed in person with Dr. Roland Rack on 06/29/2015 at 1105 hours. 2. Mildly to moderately tortuous cervical ICAs. Mild plaque at the right ICA origin and bulb without stenosis. Minimal calcified right ICA siphon plaque. Moderate plaque at the distal left ICA bulb but without hemodynamically significant stenosis. 3. Proximal left subclavian artery soft plaque without significant stenosis. Negative posterior circulation. 4. Grossly stable CT appearance of the brain since 1001 hours. Electronically Signed   By: Genevie Ann M.D.   On: 06/29/2015 11:28   Dg Chest Port 1 View  06/30/2015  CLINICAL DATA:  Intubation. EXAM: PORTABLE CHEST 1 VIEW COMPARISON:  06/29/2015. FINDINGS: Endotracheal tube tip is at the thoracic inlet, advancement of approximately 3 -4 cm should be considered. NG tube noted with tip  below left hemidiaphragm. Interim partial clearing of right upper lobe atelectasis. Cardiomegaly with mild pulmonary venous congestion and interstitial prominence. No pleural effusion or pneumothorax . IMPRESSION: 1. Endotracheal tube tip at the thoracic inlet, interim advancement of approximately 3-4 cm should be considered. NG tube in stable position. The 2. Interim partial clearing of right upper lobe atelectasis. 3. Cardiomegaly with mild pulmonary interstitial prominence. A mild component of congestive heart failure cannot be excluded. Electronically Signed   By: Marcello Moores  Register   On: 06/30/2015 07:59   Dg Chest Port 1 View  06/29/2015  CLINICAL DATA:  Endotracheal tube placement EXAM: PORTABLE CHEST 1 VIEW COMPARISON:  06/15/2015 FINDINGS: Endotracheal tube is approximately 6 cm above the carina and could be advanced 2 cm. NG tube enters stomach Right upper lobe collapse has developed since the prior study. Left lower lobe nodule better seen on prior studies. Negative for heart failure or effusion. IMPRESSION: Endotracheal tube is 6 cm above the carina, recommend advancing 2 cm Right upper lobe collapse Electronically Signed   By: Franchot Gallo M.D.   On: 06/29/2015 17:55   STUDIES:  CT chest 1/28 >2 cm LLL nodule  Echo 06/24/15 EF 60%, MV vegetation  CT head 2/5 >Positive for occlusion Right ICA/MCA , 7 mm occluded segment at M1 at right .   CULTURES:   ANTIBIOTICS: 2/5 Rocephin  2/5 Cipro>>>  SIGNIFICANT  EVENTS: Admitted 1/22-2/2 for sepsis d/t pyelonephritis , bacteremia and endocarditis with splenic infarcts . Lung mass found on this admission.  LINES/TUBES:   DISCUSSION:  60  Yo female wth recent admission for sepsis with endocarditits on prolonged abx at discharge followed by ID with acute CVA most likely from embolization despite abx therapy. She is currently in IR for thrombectomy.    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  Smoker  M P:   Vent support until neuro work up is complete Daily WUA  Eval for daily SBT when ready  OP Pet scan has been set up.   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 P:  Cont IV abx , see ID sxn  IV cipro as above.  RENAL A:  Hyponatremia   P:   Trend Bmet. Replace electrolytes as indicated.  GASTROINTESTINAL A:   NPO  P:   PPI  Consult nutrition for TF as per nutrition.  HEMATOLOGIC A:   Anemia  P:  Tr cbc   INFECTIOUS A:   MV Endocarditis   P:   Continue on IV abx with Rocephin  Add cipro until repeat cultures   ENDOCRINE A:     Hyperglycemia  P:   SSI   NEUROLOGIC A:   Right M1 Occlusion s/p IR Thrombectomy   P:   RASS goal: -1 to 0  Monitor closely  Propofol drip Neo for target SBP of 130 MRI today Sheaths out today  FAMILY  - Updates: No family bedside.  - Inter-disciplinary family meet or Palliative Care meeting due by:  2/12  The patient is critically ill with multiple organ systems failure and requires high complexity decision making for assessment and support, frequent evaluation and titration of therapies, application of advanced monitoring technologies and extensive interpretation of multiple databases.   Critical Care Time devoted to patient care services described in this note is  45  Minutes. This time reflects time of care of this signee Dr Jennet Maduro. This critical care time does not reflect procedure time, or teaching time or supervisory time of PA/NP/Med student/Med Resident etc but could involve care discussion time.  Rush Farmer, M.D. [facility] Pulmonary/Critical Care Medicine. Pager: [phone]. After hours pager: [phone].  06/30/2015, 10:18 AM

## 2015-06-30 NOTE — Progress Notes (Signed)
Dr. Ashok Cordia notified that patient's temperature had not come down. Order for H&H to see if second unit is needed. Notified of neo rate with continued BP less than neurology recommendations. Order received for CVC. Will monitor.

## 2015-06-30 NOTE — Progress Notes (Signed)
SLP Cancellation Note  Patient Details Name: Becky Patterson MRN: [id number] DOB: 01-29-1956   Cancelled treatment:       Reason Eval/Treat Not Completed: Patient not medically ready   Annaliz Aven, Katherene Ponto 06/30/2015, 7:48 AM

## 2015-06-30 NOTE — Progress Notes (Signed)
Initial Nutrition Assessment  DOCUMENTATION CODES:   Obesity unspecified  INTERVENTION:   Initiate Vital High Protein @ 20 ml/hr via OG tube and increase by 10 ml every 4 hours to goal rate of 50 ml/hr.   MVI daily  Tube feeding regimen provides 1200 kcal, 105 grams of protein, and 1003 ml of H2O.   NUTRITION DIAGNOSIS:   Inadequate oral intake related to inability to eat as evidenced by NPO status.  GOAL:   Provide needs based on ASPEN/SCCM guidelines  MONITOR:   Vent status, Weight trends, I & O's, TF tolerance  REASON FOR ASSESSMENT:   Consult Enteral/tube feeding initiation and management  ASSESSMENT:   Pt with recent admission for sepsis with endocarditits on prolonged abx at discharge followed by ID with acute CVA most likely from embolization despite abx therapy. Admitted with left sided weakness. Intubated for respiratory failure in setting of Acute CVA s/p IR thrombectomy. 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   Patient is currently intubated on ventilator support MV: 10 L/min Temp (24hrs), Avg:99.5 F (37.5 C), Min:97.9 F (36.6 C), Max:101.5 F (38.6 C)  Propofol: off  Pt discussed during ICU rounds and with RN.  Labs reviewed: magnesium low 1.6 CBG's: 133-152 (no TF)  OG tube 2/5 intubated  Husband and niece at bedside. Usual weight unknown.  Nutrition-Focused physical exam completed. Findings are no fat depletion, no muscle depletion, and moderate edema.    Diet Order:  Diet NPO time specified  Skin:  Reviewed, no issues  Last BM:  2/6  Height:   Ht Readings from Last 1 Encounters:  06/29/15 '5\' 2"'$  (1.575 m)   Weight:   Wt Readings from Last 1 Encounters:  06/29/15 192 lb 3.9 oz (87.2 kg)   Ideal Body Weight:  50 kg  BMI:  Body mass index is 35.15 kg/(m^2).  Estimated Nutritional Needs:   Kcal:  [phone]  Protein:  >/=  100 grams  Fluid:  > 1.5 L/day  EDUCATION NEEDS:   No education needs identified at this time  [place], [place], [place] Pager [phone] After Hours Pager

## 2015-06-30 NOTE — Progress Notes (Signed)
Transfusion reaction orders implemented and all materials taken to blood bank for review.

## 2015-06-30 NOTE — Progress Notes (Signed)
Referring Physician(s): Cytogeneticist Complaint:  CVA  Clot retrieval R ICA/R MCA 2/5 in IR   Subjective:  Vent Awake Seems focused Moving Rt side to command Rt groin sheath intact  Allergies: Codeine  Medications: Prior to Admission medications   Medication Sig Start Date End Date Taking? Authorizing Provider  acetaminophen (TYLENOL) 500 MG tablet Take 1,000 mg by mouth 2 (two) times daily as needed for mild pain or headache.   Yes Historical Provider, MD  ciprofloxacin (CIPRO) 750 MG tablet Take 1 tablet (750 mg total) by mouth 2 (two) times daily. 06/26/15  Yes Nita Sells, MD  ferrous sulfate 325 (65 FE) MG tablet Take 1 tablet (325 mg total) by mouth 2 (two) times daily with a meal. 06/26/15  Yes Nita Sells, MD  furosemide (LASIX) 40 MG tablet Take 1 tablet (40 mg total) by mouth daily. 06/26/15  Yes Nita Sells, MD  Hydrocodone-Acetaminophen 5-300 MG TABS Take 1 tablet by mouth every 6 (six) hours as needed (for pain). Reported on 06/15/2015 06/08/15  Yes Historical Provider, MD  naproxen sodium (ANAPROX) 220 MG tablet Take 440 mg by mouth 2 (two) times daily as needed (for pain).   Yes Historical Provider, MD  promethazine (PHENERGAN) 6.25 MG/5ML syrup Take 10 mLs (12.5 mg total) by mouth every 6 (six) hours as needed for nausea or vomiting. 06/26/15  Yes Nita Sells, MD  traMADol (ULTRAM) 50 MG tablet Take 1-2 tablets (50-100 mg total) by mouth every 6 (six) hours as needed for moderate pain. 06/26/15  Yes Nita Sells, MD     Vital Signs: BP 106/59 mmHg  Pulse 87  Temp(Src) 99.1 F (37.3 C) (Axillary)  Resp 16  Ht '5\' 2"'$  (1.575 m)  Wt 192 lb 3.9 oz (87.2 kg)  BMI 35.15 kg/m2  SpO2 100%  Physical Exam  Pulmonary/Chest:  vent  Musculoskeletal:  Moves Rt to command Tries to open mouth Follows me with eyes Does use EOM to command  Neurological:  Focuses on  Me Tries to communicate  Skin: Skin is warm. Rash noted.  Rt groin:  no bleeding No hematoma Rt groin sheath intact  Rt foot 1+ pulses  Nursing note and vitals reviewed.   Imaging: Ct Angio Head W/cm &/or Wo Cm  06/29/2015  CLINICAL DATA:  60 year 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 Initial encounter. EXAM: CT ANGIOGRAPHY HEAD AND NECK TECHNIQUE: Multidetector CT imaging of the head and neck was performed using the standard protocol during bolus administration of intravenous contrast. Multiplanar CT image reconstructions and MIPs were obtained to evaluate the vascular anatomy. Carotid stenosis measurements (when applicable) are obtained utilizing NASCET criteria, using the distal internal carotid diameter as the denominator. CONTRAST:  50 mL Omnipaque 350 COMPARISON:  Head CT without contrast 1001 hours today. FINDINGS: CTA NECK Skeleton: Intermittent dental periapical lucency. No acute osseous abnormality identified. Visualized paranasal sinuses and mastoids are clear. Other neck: Negative lung apices other than dependent atelectasis. No superior mediastinal lymphadenopathy. Thyroid, larynx, pharynx, parapharyngeal spaces, retropharyngeal space, sublingual space, submandibular glands, and parotid glands are within normal limits. No cervical lymphadenopathy. Aortic arch: 3 vessel arch configuration. Minimal distal arch calcified plaque. No great vessel origin stenosis. Right carotid system: Negative right CCA and right carotid bifurcation. Moderately tortuous proximal right ICA with a mildly kinked appearance just past the bulb. Otherwise negative cervical right ICA. Left carotid system: Negative left CCA. Soft plaque in the left ICA origin and bulb less than 50 %  stenosis with respect to the distal vessel at the distal bulb. Tortuous cervical left ICA otherwise. Vertebral arteries:No proximal right subclavian artery stenosis. Normal right vertebral artery origin. Mildly dominant right vertebral artery. Soft plaque in the proximal left  subclavian artery just proximal to the left vertebral artery origin with less than 50 % stenosis with respect to the distal vessel. The left vertebral artery origin appears normal. Mildly non dominant left vertebral artery appears normal to the skullbase. CTA HEAD Posterior circulation: Mildly dominant distal right vertebral artery. Normal PICA origins. No distal vertebral stenosis. Patent vertebrobasilar junction. Patent basilar artery without stenosis. SCA and left PCA origins are normal, fetal type right PCA origin. PCA branches are within normal limits. Anterior circulation: Both ICA siphons are patent without stenosis. Minimal calcified plaque in the right cavernous segment. Normal right posterior communicating artery origin. The left ICA terminus is normal. Filling defect at the right ICA terminus, MCA and ACA origin best seen on series 505, image 17. 7-8 mm segment of the right MCA M1 segment is nonenhancing, but with collateral enhancement at the right MCA bifurcation and sylvian division. The right ACA A1 segment is non dominant. It remains patent likely supplied from the left. Normal left MCA and ACA origins. Dominant left A1. Anterior communicating artery and bilateral ACA branches are within normal limits. Left MCA M1 segment, bifurcation, and left MCA branches are within normal limits. Venous sinuses: Patent. Anatomic variants: Dominant left ACA A1 segment. Fetal type right PCA origin. Mildly dominant right vertebral artery. Right hemisphere gray-white matter differentiation appears stableand normal compared to 1001 hours today. IMPRESSION: 1. Positive for emergent large vessel occlusion: Occluded right ICA terminus, right MCA and right ACA origins. 7-8 mm occluded segment of the right M1 segment but good right MCA M2 and distal collaterals. Also, the left A1 is dominant such that bilateral A2 and distal ACA flow appears symmetric. Finding of ELVO at the [facility] origin reviewed in person with Dr. Roland Rack on 06/29/2015 at 1105 hours. 2. Mildly to moderately tortuous cervical ICAs. Mild plaque at the right ICA origin and bulb without stenosis. Minimal calcified right ICA siphon plaque. Moderate plaque at the distal left ICA bulb but without hemodynamically significant stenosis. 3. Proximal left subclavian artery soft plaque without significant stenosis. Negative posterior circulation. 4. Grossly stable CT appearance of the brain since 1001 hours. Electronically Signed   By: Genevie Ann M.D.   On: 06/29/2015 11:28   Ct Head Wo Contrast  06/29/2015  CLINICAL DATA:  60 year old female status post neurointervention, clot retrieval for emergent right ICA terminus occlusion in the setting of infectious endocarditis. Initial encounter. EXAM: CT HEAD WITHOUT CONTRAST TECHNIQUE: Contiguous axial images were obtained from the base of the skull through the vertex without intravenous contrast. COMPARISON:  Cta head and neck 1039 hours today and earlier. FINDINGS: Fluid in the pharynx. Mild paranasal sinus mucosal thickening. Stable visualized osseous structures. Stable orbit and scalp soft tissues. Residual intravascular contrast. Mild contrast staining along the course of the right MCA posterior division in the right temporal lobe and posterior insula. No CT changes of acute cortically based infarct are identified. No acute intracranial hemorrhage identified. No midline shift, mass effect, or evidence of intracranial mass lesion. No ventriculomegaly. Stable basilar cisterns. IMPRESSION: No adverse features status post Neurointervention for emergent right ICA terminus occlusion. No acute or developing right hemisphere infarct identified. Electronically Signed   By: Genevie Ann M.D.   On: 06/29/2015 15:55   Ct  Head Wo Contrast  06/29/2015  CLINICAL DATA:  Code stroke.  Left-sided weakness and slurred speech EXAM: CT HEAD WITHOUT CONTRAST TECHNIQUE: Contiguous axial images were obtained from the base of the skull through the  vertex without intravenous contrast. COMPARISON:  CT head 06/17/2015 FINDINGS: Ventricle size normal. Cerebral volume normal for age. Mild hypodensity in the periventricular white matter consistent with mild chronic microvascular ischemia. Negative for acute infarct.  Negative for acute hemorrhage or mass. Calvarium intact. IMPRESSION: Mild chronic microvascular ischemia.  No acute abnormality Critical Value/emergent results were called by telephone at the time of interpretation on 06/29/2015 at 10:13 am to Dr. Leonel Ramsay , who verbally acknowledged these results. Electronically Signed   By: Franchot Gallo M.D.   On: 06/29/2015 10:13   Ct Angio Neck W/cm &/or Wo/cm  06/29/2015  CLINICAL DATA:  60 year 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 Initial encounter. EXAM: CT ANGIOGRAPHY HEAD AND NECK TECHNIQUE: Multidetector CT imaging of the head and neck was performed using the standard protocol during bolus administration of intravenous contrast. Multiplanar CT image reconstructions and MIPs were obtained to evaluate the vascular anatomy. Carotid stenosis measurements (when applicable) are obtained utilizing NASCET criteria, using the distal internal carotid diameter as the denominator. CONTRAST:  50 mL Omnipaque 350 COMPARISON:  Head CT without contrast 1001 hours today. FINDINGS: CTA NECK Skeleton: Intermittent dental periapical lucency. No acute osseous abnormality identified. Visualized paranasal sinuses and mastoids are clear. Other neck: Negative lung apices other than dependent atelectasis. No superior mediastinal lymphadenopathy. Thyroid, larynx, pharynx, parapharyngeal spaces, retropharyngeal space, sublingual space, submandibular glands, and parotid glands are within normal limits. No cervical lymphadenopathy. Aortic arch: 3 vessel arch configuration. Minimal distal arch calcified plaque. No great vessel origin stenosis. Right carotid system: Negative right CCA and right  carotid bifurcation. Moderately tortuous proximal right ICA with a mildly kinked appearance just past the bulb. Otherwise negative cervical right ICA. Left carotid system: Negative left CCA. Soft plaque in the left ICA origin and bulb less than 50 % stenosis with respect to the distal vessel at the distal bulb. Tortuous cervical left ICA otherwise. Vertebral arteries:No proximal right subclavian artery stenosis. Normal right vertebral artery origin. Mildly dominant right vertebral artery. Soft plaque in the proximal left subclavian artery just proximal to the left vertebral artery origin with less than 50 % stenosis with respect to the distal vessel. The left vertebral artery origin appears normal. Mildly non dominant left vertebral artery appears normal to the skullbase. CTA HEAD Posterior circulation: Mildly dominant distal right vertebral artery. Normal PICA origins. No distal vertebral stenosis. Patent vertebrobasilar junction. Patent basilar artery without stenosis. SCA and left PCA origins are normal, fetal type right PCA origin. PCA branches are within normal limits. Anterior circulation: Both ICA siphons are patent without stenosis. Minimal calcified plaque in the right cavernous segment. Normal right posterior communicating artery origin. The left ICA terminus is normal. Filling defect at the right ICA terminus, MCA and ACA origin best seen on series 505, image 17. 7-8 mm segment of the right MCA M1 segment is nonenhancing, but with collateral enhancement at the right MCA bifurcation and sylvian division. The right ACA A1 segment is non dominant. It remains patent likely supplied from the left. Normal left MCA and ACA origins. Dominant left A1. Anterior communicating artery and bilateral ACA branches are within normal limits. Left MCA M1 segment, bifurcation, and left MCA branches are within normal limits. Venous sinuses: Patent. Anatomic variants: Dominant left ACA A1 segment.  Fetal type right PCA origin.  Mildly dominant right vertebral artery. Right hemisphere gray-white matter differentiation appears stableand normal compared to 1001 hours today. IMPRESSION: 1. Positive for emergent large vessel occlusion: Occluded right ICA terminus, right MCA and right ACA origins. 7-8 mm occluded segment of the right M1 segment but good right MCA M2 and distal collaterals. Also, the left A1 is dominant such that bilateral A2 and distal ACA flow appears symmetric. Finding of ELVO at the [facility] origin reviewed in person with Dr. Roland Rack on 06/29/2015 at 1105 hours. 2. Mildly to moderately tortuous cervical ICAs. Mild plaque at the right ICA origin and bulb without stenosis. Minimal calcified right ICA siphon plaque. Moderate plaque at the distal left ICA bulb but without hemodynamically significant stenosis. 3. Proximal left subclavian artery soft plaque without significant stenosis. Negative posterior circulation. 4. Grossly stable CT appearance of the brain since 1001 hours. Electronically Signed   By: Genevie Ann M.D.   On: 06/29/2015 11:28   Dg Chest Port 1 View  06/30/2015  CLINICAL DATA:  Intubation. EXAM: PORTABLE CHEST 1 VIEW COMPARISON:  06/29/2015. FINDINGS: Endotracheal tube tip is at the thoracic inlet, advancement of approximately 3 -4 cm should be considered. NG tube noted with tip below left hemidiaphragm. Interim partial clearing of right upper lobe atelectasis. Cardiomegaly with mild pulmonary venous congestion and interstitial prominence. No pleural effusion or pneumothorax . IMPRESSION: 1. Endotracheal tube tip at the thoracic inlet, interim advancement of approximately 3-4 cm should be considered. NG tube in stable position. The 2. Interim partial clearing of right upper lobe atelectasis. 3. Cardiomegaly with mild pulmonary interstitial prominence. A mild component of congestive heart failure cannot be excluded. Electronically Signed   By: Marcello Moores  Register   On: 06/30/2015 07:59   Dg Chest Port 1  View  06/29/2015  CLINICAL DATA:  Endotracheal tube placement EXAM: PORTABLE CHEST 1 VIEW COMPARISON:  06/15/2015 FINDINGS: Endotracheal tube is approximately 6 cm above the carina and could be advanced 2 cm. NG tube enters stomach Right upper lobe collapse has developed since the prior study. Left lower lobe nodule better seen on prior studies. Negative for heart failure or effusion. IMPRESSION: Endotracheal tube is 6 cm above the carina, recommend advancing 2 cm Right upper lobe collapse Electronically Signed   By: Franchot Gallo M.D.   On: 06/29/2015 17:55    Labs:  CBC:  Recent Labs  06/25/15 0651 06/26/15 0701 06/29/15 1014 06/30/15 0002 06/30/15 0430  WBC 14.8* 16.2* 15.0*  --  14.5*  HGB 7.8* 8.5* 8.7*  9.5* 7.7* 8.1*  HCT 23.2* 25.2* 25.9*  28.0* 22.7* 24.0*  PLT 348 386 390  --  366    COAGS:  Recent Labs  06/16/15 0920 06/17/15 0527 06/29/15 1014 06/30/15 0002  INR 1.52* 1.45 1.41 1.46  APTT 34  --  38* 32    BMP:  Recent Labs  06/23/15 0850 06/24/15 0545 06/29/15 1014 06/30/15 0430  NA 133* 134* 130*  132* 135  K 3.2* 4.2 3.9  3.8 3.7  CL 97* 98* 94*  96* 104  CO2 '24 25 24 '$ 21*  GLUCOSE 236* 133* 182*  176* 148*  BUN '7 6 8  8 '$ <5*  CALCIUM 8.2* 8.4* 8.4* 7.5*  CREATININE 0.95 0.93 1.04*  0.90 0.85  GFRNONAA >60 >60 57* >60  GFRAA >60 >60 >60 >60    LIVER FUNCTION TESTS:  Recent Labs  06/15/15 1520 06/17/15 0527 06/29/15 1014  BILITOT 0.6 0.3 0.4  AST '30 20 20  '$ ALT '30 27 16  '$ ALKPHOS 115 79 76  PROT 6.9 5.4* 6.4*  ALBUMIN 2.4* 1.9* 2.2*    Assessment and Plan:  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

## 2015-06-30 NOTE — Progress Notes (Signed)
eLink Physician-Brief Progress Note Patient Name: URIJAH RAYNOR DOB: 01/02/1956 MRN: [id number]   Date of Service  06/30/2015  HPI/Events of Note  BP now 129/63. Transitioning off of Propofol drip to intermittent Fentanyl/Versed. Finishing bolus of NS. New BP goal per Dr. Nicole Kindred.  eICU Interventions  1. Continuing current plan by Dr. Chase Caller. 2. LR 1L bolus     Intervention Category Major Interventions: Hypotension - evaluation and management  Tera Partridge 06/30/2015, 2:32 AM

## 2015-06-30 NOTE — Progress Notes (Signed)
[facility] Progress Note Patient Name: Becky Patterson DOB: 1955/05/26 MRN: [id number]   Date of Service  06/30/2015  HPI/Events of Note  RN reports rash over majority of thorax. Question possible transfusion reaction versus Rocephin allergy. Currently on Cipro & Rocephin.  eICU Interventions  Defer to day rounder & possible ID consult for further antibiotic selection.      Intervention Category Intermediate Interventions: Other:  Tera Partridge 06/30/2015, 2:47 AM

## 2015-06-30 NOTE — Progress Notes (Signed)
Pharmacy Antibiotic Note  Becky Patterson is a 60 y.o. female admitted on 06/29/2015 with left-sided weakness and diagnosed with CVA.  Pharmacy has been consulted for primaxin dosing for infective endocarditis with septic CNS emboli. Infective endocarditis initially diagnosed in January. Tmax 101.5, WBC is 14.5 and Scr is WNL.    Plan: - Primaxin '500mg'$  IV Q6H - F/u renal fxn, C&S, clinical status - F/u further ID recommendations  Height: '5\' 2"'$  (157.5 cm) Weight: 192 lb 3.9 oz (87.2 kg) IBW/kg (Calculated) : 50.1  Temp (24hrs), Avg:99.5 F (37.5 C), Min:97.9 F (36.6 C), Max:101.5 F (38.6 C)   Recent Labs Lab 06/24/15 0545 06/25/15 0651 06/26/15 0701 06/29/15 1014 06/30/15 0430  WBC 18.9* 14.8* 16.2* 15.0* 14.5*  CREATININE 0.93  --   --  1.04*  0.90 0.85    Estimated Creatinine Clearance: 71.2 mL/min (by C-G formula based on Cr of 0.85).    Allergies  Allergen Reactions  . Codeine Nausea And Vomiting    Antimicrobials this admission: Cipro from PTA >> 2/6 CTX 2/5 >>2/6 Priamxin 2/6>>  Dose adjustments this admission: N/A  Microbiology results: 2/5 BCx x2 - NGTD 2/5 MRSA PCR - NEG  Salome Arnt, PharmD, BCPS Pager # [phone] 06/30/2015 3:09 PM

## 2015-06-30 NOTE — Progress Notes (Signed)
eLink Physician-Brief Progress Note Patient Name: Becky Patterson DOB: [birth year] MRN: [id number]   Date of Service  06/30/2015  HPI/Events of Note  Camera check for sedation on ventilator. Received IV Fentanyl & Versed. Better synchrony now. BP 128/75. Now fully off Propofol.  eICU Interventions  1. Continue current plan of care 2. Change Versed & Fentanyl to q1hr prn IV     Intervention Category Intermediate Interventions: Other:  Tera Partridge 06/30/2015, 3:25 AM

## 2015-06-30 NOTE — Consult Note (Signed)
[place] for Infectious Disease  Total days of antibiotics 14(including days from prior hosp)        Day 2 ceftriaxone        Day 5 cipro               Reason for Consult: enterobacter endocarditis with CNS emboli    Referring Physician: yacoub  Active Problems:   Stroke (cerebrum) ([place])   Acute respiratory failure with hypoxia (HCC)   Arterial hypotension   Acute encephalopathy    HPI: Becky Patterson is a 60 y.o. 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  She was seen by Dr. 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 He recommended 6 wks of ciprofloxacin 774m BID to take through March 7th. On Feb 5th, She was readmitted 3 days after discharge from previous hospitalization due to sudden onset of left-sided weakness that started around 6:30 AM. She woke up normal, with clear speech and no weakness and then developed slurred speech and hand weakness. She has progressively declined and taken to the ED for evaluation of possible stroke. She was found to have  left-sided weakness as well as neglect.  She went for a CT which showed M1 occlusion with some collateralization. once was recognized that she had infective endocarditis, TPA was considered contraindicated but decision was to pursue mechanical thrombectomy by interventional radiology in order to attempt revascularization of the right MCA occlusion. She was kept on cipro as well as ceftriaxone for antibiotic regimen. She was noted to have isolated fever of 101.54F last night, shortly after starting blood transfusions. In addition to the fever, she has developed a diffuse macular papular rash to torso, extremities, neck. She had received cephalosporins plus continue on cipro at time of rash. Now has started on steroids. Pateint  being monitored, kept intubated in ICU for response to IR procedure as well as hyponatremia hwere she was 130 now up to 135. Family reports seeing her move her left upper extremities twice today   Active Ambulatory Problems    Diagnosis Date Noted  . Lung mass 06/15/2015  . [place]   . Mass of lower lobe of left lung   . Tobacco abuse   . Anemia of chronic disease   . Hyponatremia   . Hyperglycemia   . Hypokalemia   . Enterococcal bacteremia 06/19/2015  . Colitis   . Pyelonephritis    Resolved Ambulatory Problems    Diagnosis Date Noted  . Sepsis ([place] 06/15/2015  . Anemia, unspecified 06/15/2015  . Bacteremia    No Additional Past Medical History    Allergies:  Allergies  Allergen Reactions  . Codeine Nausea And Vomiting    MEDICATIONS: . antiseptic oral rinse  7 mL Mouth Rinse 10 times per day  . cefTRIAXone (ROCEPHIN)  IV  2 g Intravenous Q12H  . chlorhexidine gluconate  15 mL Mouth Rinse BID  . insulin aspart  0-15 Units Subcutaneous 6 times per day  . magnesium sulfate 1 - 4 g bolus IVPB  2 g Intravenous Once  . methylPREDNISolone (SOLU-MEDROL) injection  60 mg Intravenous Once  . pantoprazole (PROTONIX) IV  40 mg Intravenous Q24H    Social History  Substance Use Topics  . Smoking status: Current Every Day Smoker  . Smokeless tobacco:  None  . Alcohol Use: No    Family History  Problem Relation Age of Onset  . Lung cancer Mother   . Liver cancer Brother   . Heart attack Brother     Review of Systems - Unable to obtain since she is sedated and intubated  OBJECTIVE: Temp:  [97.9 F (36.6 C)-101.5 F (38.6 C)] 99.1 F (37.3 C) (02/06 0800) Pulse Rate:  [78-107] 94 (02/06 1000) Resp:  [12-28] 26 (02/06 1000) BP: (86-144)/(45-83) 118/76 mmHg (02/06 1000) SpO2:  [95 %-100 %] 100 % (02/06 1000) Arterial Line BP: (93-155)/(53-92) 131/74 mmHg (02/06 0900) FiO2 (%):  [40 %-50 %] 40 % (02/06 0814) Weight:  [192 lb 3.9 oz (87.2 kg)]  192 lb 3.9 oz (87.2 kg) (02/05 1553) Physical Exam  Constitutional:  Sedated, intubated. appears well-developed and well-nourished. No distress.  HENT: [place]/AT, PERRLA, no scleral icterus Mouth/Throat: Oropharynx is clear and moist. No oropharyngeal exudate.  Cardiovascular: Normal rate, regular rhythm and normal heart sounds. Exam reveals no gallop and no friction rub.  No murmur heard.  Pulmonary/Chest: Effort normal and breath sounds normal. No respiratory distress.  has no wheezes.  Neck = supple, no nuchal rigidity Abdominal: Soft. Bowel sounds are normal.  exhibits no distension. There is no tenderness.  Lymphadenopathy: no cervical adenopathy. No axillary adenopathy Neurological: opens eyes to verbal stimuli. Spontaneous movement of right foot, right arm Skin: Skin is warm and dry. No rash noted. No erythema.  Psychiatric: a normal mood and affect.  behavior is normal.   LABS: Results for orders placed or performed during the hospital encounter of 06/29/15 (from the past 48 hour(s))  CBG monitoring, ED     Status: Abnormal   Collection Time: 06/29/15  9:56 AM  Result Value Ref Range   Glucose-Capillary 154 (H) 65 - 99 mg/dL  I-stat troponin, ED (not at [facility], [facility])     Status: None   Collection Time: 06/29/15 10:12 AM  Result Value Ref Range   Troponin i, poc 0.07 0.00 - 0.08 ng/mL   Comment 3            Comment: Due to the release kinetics of cTnI, a negative result within the first hours of the onset of symptoms does not rule out myocardial infarction with certainty. If myocardial infarction is still suspected, repeat the test at appropriate intervals.   I-Stat Chem 8, ED  (not at [facility], [facility])     Status: Abnormal   Collection Time: 06/29/15 10:14 AM  Result Value Ref Range   Sodium 132 (L) 135 - 145 mmol/L   Potassium 3.8 3.5 - 5.1 mmol/L   Chloride 96 (L) 101 - 111 mmol/L   BUN 8 6 - 20 mg/dL   Creatinine, Ser 0.90 0.44 - 1.00 mg/dL   Glucose, Bld 176 (H) 65 - 99 mg/dL     Calcium, Ion 1.09 (L) 1.13 - 1.30 mmol/L   TCO2 25 0 - 100 mmol/L   Hemoglobin 9.5 (L) 12.0 - 15.0 g/dL   HCT 28.0 (L) 36.0 - 46.0 %  Ethanol     Status: None   Collection Time: 06/29/15 10:14 AM  Result Value Ref Range   Alcohol, Ethyl (B) <5 <5 mg/dL    Comment:        LOWEST DETECTABLE LIMIT FOR SERUM ALCOHOL IS 5 mg/dL FOR MEDICAL PURPOSES ONLY   Protime-INR     Status: Abnormal   Collection Time: 06/29/15 10:14 AM  Result Value Ref Range   Prothrombin  Time 17.4 (H) 11.6 - 15.2 seconds   INR 1.41 0.00 - 1.49  APTT     Status: Abnormal   Collection Time: 06/29/15 10:14 AM  Result Value Ref Range   aPTT 38 (H) 24 - 37 seconds    Comment:        IF BASELINE aPTT IS ELEVATED, SUGGEST PATIENT RISK ASSESSMENT BE USED TO DETERMINE APPROPRIATE ANTICOAGULANT THERAPY.   CBC     Status: Abnormal   Collection Time: 06/29/15 10:14 AM  Result Value Ref Range   WBC 15.0 (H) 4.0 - 10.5 K/uL   RBC 2.79 (L) 3.87 - 5.11 MIL/uL   Hemoglobin 8.7 (L) 12.0 - 15.0 g/dL   HCT 25.9 (L) 36.0 - 46.0 %   MCV 92.8 78.0 - 100.0 fL   MCH 31.2 26.0 - 34.0 pg   MCHC 33.6 30.0 - 36.0 g/dL   RDW 14.1 11.5 - 15.5 %   Platelets 390 150 - 400 K/uL  Differential     Status: Abnormal   Collection Time: 06/29/15 10:14 AM  Result Value Ref Range   Neutrophils Relative % 79 %   Neutro Abs 11.9 (H) 1.7 - 7.7 K/uL   Lymphocytes Relative 14 %   Lymphs Abs 2.1 0.7 - 4.0 K/uL   Monocytes Relative 5 %   Monocytes Absolute 0.8 0.1 - 1.0 K/uL   Eosinophils Relative 2 %   Eosinophils Absolute 0.2 0.0 - 0.7 K/uL   Basophils Relative 0 %   Basophils Absolute 0.0 0.0 - 0.1 K/uL  Comprehensive metabolic panel     Status: Abnormal   Collection Time: 06/29/15 10:14 AM  Result Value Ref Range   Sodium 130 (L) 135 - 145 mmol/L   Potassium 3.9 3.5 - 5.1 mmol/L   Chloride 94 (L) 101 - 111 mmol/L   CO2 24 22 - 32 mmol/L   Glucose, Bld 182 (H) 65 - 99 mg/dL   BUN 8 6 - 20 mg/dL   Creatinine, Ser 1.04 (H) 0.44 -  1.00 mg/dL   Calcium 8.4 (L) 8.9 - 10.3 mg/dL   Total Protein 6.4 (L) 6.5 - 8.1 g/dL   Albumin 2.2 (L) 3.5 - 5.0 g/dL   AST 20 15 - 41 U/L   ALT 16 14 - 54 U/L   Alkaline Phosphatase 76 38 - 126 U/L   Total Bilirubin 0.4 0.3 - 1.2 mg/dL   GFR calc non Af Amer 57 (L) >60 mL/min   GFR calc Af Amer >60 >60 mL/min    Comment: (NOTE) The eGFR has been calculated using the CKD EPI equation. This calculation has not been validated in all clinical situations. eGFR's persistently <60 mL/min signify possible Chronic Kidney Disease.    Anion gap 12 5 - 15  Urine rapid drug screen (hosp performed)not at [facility]     Status: Abnormal   Collection Time: 06/29/15 10:48 AM  Result Value Ref Range   Opiates NONE DETECTED NONE DETECTED   Cocaine NONE DETECTED NONE DETECTED   Benzodiazepines NONE DETECTED NONE DETECTED   Amphetamines NONE DETECTED NONE DETECTED   Tetrahydrocannabinol POSITIVE (A) NONE DETECTED   Barbiturates NONE DETECTED NONE DETECTED    Comment:        DRUG SCREEN FOR MEDICAL PURPOSES ONLY.  IF CONFIRMATION IS NEEDED FOR ANY PURPOSE, NOTIFY LAB WITHIN 5 DAYS.        LOWEST DETECTABLE LIMITS FOR URINE DRUG SCREEN Drug Class       Cutoff (ng/mL) Amphetamine  1000 Barbiturate      200 Benzodiazepine   161 Tricyclics       096 Opiates          300 Cocaine          300 THC              50   Urinalysis, Routine w reflex microscopic (not at [facility])     Status: Abnormal   Collection Time: 06/29/15 10:48 AM  Result Value Ref Range   Color, Urine YELLOW YELLOW   APPearance CLEAR CLEAR   Specific Gravity, Urine 1.009 1.005 - 1.030   pH 7.0 5.0 - 8.0   Glucose, UA NEGATIVE NEGATIVE mg/dL   Hgb urine dipstick SMALL (A) NEGATIVE   Bilirubin Urine NEGATIVE NEGATIVE   Ketones, ur NEGATIVE NEGATIVE mg/dL   Protein, ur NEGATIVE NEGATIVE mg/dL   Nitrite NEGATIVE NEGATIVE   Leukocytes, UA MODERATE (A) NEGATIVE  Urine microscopic-add on     Status: Abnormal   Collection Time:  06/29/15 10:48 AM  Result Value Ref Range   Squamous Epithelial / LPF 0-5 (A) NONE SEEN   WBC, UA 0-5 0 - 5 WBC/hpf   RBC / HPF 0-5 0 - 5 RBC/hpf   Bacteria, UA NONE SEEN NONE SEEN  Type and screen [place]     Status: None   Collection Time: 06/29/15  2:45 PM  Result Value Ref Range   ABO/RH(D) O POS    Antibody Screen NEG    Sample Expiration 07/02/2015    Unit Number [id number]    Blood Component Type RED CELLS,LR    Unit division 00    Status of Unit DISCARDED    Transfusion Status OK TO TRANSFUSE    Crossmatch Result Compatible    Unit Number [id number]    Blood Component Type RED CELLS,LR    Unit division 00    Status of Unit ISSUED,FINAL    Transfusion Status OK TO TRANSFUSE    Crossmatch Result Compatible   ABO/Rh     Status: None   Collection Time: 06/29/15  2:45 PM  Result Value Ref Range   ABO/RH(D) O POS   Prepare RBC (crossmatch)     Status: None   Collection Time: 06/29/15  2:46 PM  Result Value Ref Range   Order Confirmation ORDER PROCESSED BY BLOOD BANK   Prepare RBC     Status: None   Collection Time: 06/29/15  2:46 PM  Result Value Ref Range   Order Confirmation ORDER PROCESSED BY BLOOD BANK   MRSA PCR Screening     Status: None   Collection Time: 06/29/15  3:54 PM  Result Value Ref Range   MRSA by PCR NEGATIVE NEGATIVE    Comment:        The GeneXpert MRSA Assay (FDA approved for NASAL specimens only), is one component of a comprehensive MRSA colonization surveillance program. It is not intended to diagnose MRSA infection nor to guide or monitor treatment for MRSA infections.   Glucose, capillary     Status: Abnormal   Collection Time: 06/29/15  5:24 PM  Result Value Ref Range   Glucose-Capillary 127 (H) 65 - 99 mg/dL  Glucose, capillary     Status: Abnormal   Collection Time: 06/29/15  8:41 PM  Result Value Ref Range   Glucose-Capillary 165 (H) 65 - 99 mg/dL  Glucose, capillary     Status: Abnormal   Collection  Time: 06/29/15 11:56 PM  Result Value Ref  Range   Glucose-Capillary 143 (H) 65 - 99 mg/dL  Hemoglobin and hematocrit, blood     Status: Abnormal   Collection Time: 06/30/15 12:02 AM  Result Value Ref Range   Hemoglobin 7.7 (L) 12.0 - 15.0 g/dL   HCT 22.7 (L) 36.0 - 46.0 %  Protime-INR     Status: Abnormal   Collection Time: 06/30/15 12:02 AM  Result Value Ref Range   Prothrombin Time 17.8 (H) 11.6 - 15.2 seconds   INR 1.46 0.00 - 1.49  APTT     Status: None   Collection Time: 06/30/15 12:02 AM  Result Value Ref Range   aPTT 32 24 - 37 seconds  Transfusion reaction     Status: None (Preliminary result)   Collection Time: 06/30/15  1:00 AM  Result Value Ref Range   Post RXN DAT IgG NEG    DAT C3 NEG    Path interp tx rxn PENDING   Urinalysis with microscopic (not at [facility])     Status: Abnormal   Collection Time: 06/30/15  1:00 AM  Result Value Ref Range   Color, Urine YELLOW YELLOW   APPearance CLEAR CLEAR   Specific Gravity, Urine 1.017 1.005 - 1.030   pH 6.5 5.0 - 8.0   Glucose, UA NEGATIVE NEGATIVE mg/dL   Hgb urine dipstick NEGATIVE NEGATIVE   Bilirubin Urine NEGATIVE NEGATIVE   Ketones, ur NEGATIVE NEGATIVE mg/dL   Protein, ur NEGATIVE NEGATIVE mg/dL   Nitrite NEGATIVE NEGATIVE   Leukocytes, UA SMALL (A) NEGATIVE   WBC, UA 0-5 0 - 5 WBC/hpf   RBC / HPF NONE SEEN 0 - 5 RBC/hpf   Bacteria, UA RARE (A) NONE SEEN   Squamous Epithelial / LPF 0-5 (A) NONE SEEN  CBC WITH DIFFERENTIAL     Status: Abnormal   Collection Time: 06/30/15  4:30 AM  Result Value Ref Range   WBC 14.5 (H) 4.0 - 10.5 K/uL   RBC 2.60 (L) 3.87 - 5.11 MIL/uL   Hemoglobin 8.1 (L) 12.0 - 15.0 g/dL   HCT 24.0 (L) 36.0 - 46.0 %   MCV 92.3 78.0 - 100.0 fL   MCH 31.2 26.0 - 34.0 pg   MCHC 33.8 30.0 - 36.0 g/dL   RDW 14.6 11.5 - 15.5 %   Platelets 366 150 - 400 K/uL   Neutrophils Relative % 78 %   Neutro Abs 11.3 (H) 1.7 - 7.7 K/uL   Lymphocytes Relative 13 %   Lymphs Abs 1.9 0.7 - 4.0 K/uL   Monocytes  Relative 7 %   Monocytes Absolute 1.0 0.1 - 1.0 K/uL   Eosinophils Relative 2 %   Eosinophils Absolute 0.3 0.0 - 0.7 K/uL   Basophils Relative 0 %   Basophils Absolute 0.0 0.0 - 0.1 K/uL  Basic metabolic panel     Status: Abnormal   Collection Time: 06/30/15  4:30 AM  Result Value Ref Range   Sodium 135 135 - 145 mmol/L   Potassium 3.7 3.5 - 5.1 mmol/L   Chloride 104 101 - 111 mmol/L   CO2 21 (L) 22 - 32 mmol/L   Glucose, Bld 148 (H) 65 - 99 mg/dL   BUN <5 (L) 6 - 20 mg/dL   Creatinine, Ser 0.85 0.44 - 1.00 mg/dL   Calcium 7.5 (L) 8.9 - 10.3 mg/dL   GFR calc non Af Amer >60 >60 mL/min   GFR calc Af Amer >60 >60 mL/min    Comment: (NOTE) The eGFR has been calculated using the  CKD EPI equation. This calculation has not been validated in all clinical situations. eGFR's persistently <60 mL/min signify possible Chronic Kidney Disease.    Anion gap 10 5 - 15  Magnesium     Status: Abnormal   Collection Time: 06/30/15  4:30 AM  Result Value Ref Range   Magnesium 1.6 (L) 1.7 - 2.4 mg/dL  Phosphorus     Status: None   Collection Time: 06/30/15  4:30 AM  Result Value Ref Range   Phosphorus 3.2 2.5 - 4.6 mg/dL  Lipid panel     Status: Abnormal   Collection Time: 06/30/15  4:30 AM  Result Value Ref Range   Cholesterol 64 0 - 200 mg/dL   Triglycerides 109 <150 mg/dL   HDL 16 (L) >40 mg/dL   Total CHOL/HDL Ratio 4.0 RATIO   VLDL 22 0 - 40 mg/dL   LDL Cholesterol 26 0 - 99 mg/dL    Comment:        Total Cholesterol/HDL:CHD Risk Coronary Heart Disease Risk Table                     Men   Women  1/2 Average Risk   3.4   3.3  Average Risk       5.0   4.4  2 X Average Risk   9.6   7.1  3 X Average Risk  23.4   11.0        Use the calculated Patient Ratio above and the CHD Risk Table to determine the patient's CHD Risk.        ATP III CLASSIFICATION (LDL):  <100     mg/dL   Optimal  100-129  mg/dL   Near or Above                    Optimal  130-159  mg/dL   Borderline   160-189  mg/dL   High  >190     mg/dL   Very High   Blood gas, arterial     Status: Abnormal   Collection Time: 06/30/15  4:35 AM  Result Value Ref Range   FIO2 0.40    Delivery systems VENTILATOR    Mode PRESSURE REGULATED VOLUME CONTROL    VT 410 mL   LHR 15 resp/min   Peep/cpap 5.0 cm H20   pH, Arterial 7.306 (L) 7.350 - 7.450   pCO2 arterial 41.7 35.0 - 45.0 mmHg   pO2, Arterial 95.8 80.0 - 100.0 mmHg   Bicarbonate 19.8 (L) 20.0 - 24.0 mEq/L   TCO2 21.0 0 - 100 mmol/L   Acid-base deficit 5.1 (H) 0.0 - 2.0 mmol/L   O2 Saturation 95.9 %   Patient temperature 101.3    Collection site A-LINE    Drawn by [phone]    Sample type ARTERIAL DRAW   Glucose, capillary     Status: Abnormal   Collection Time: 06/30/15  4:39 AM  Result Value Ref Range   Glucose-Capillary 133 (H) 65 - 99 mg/dL  Glucose, capillary     Status: Abnormal   Collection Time: 06/30/15  7:44 AM  Result Value Ref Range   Glucose-Capillary 152 (H) 65 - 99 mg/dL    MICRO: 2/5 blood cx pending IMAGING: Ct Angio Head W/cm &/or Wo Cm  06/29/2015  CLINICAL DATA:  60 year 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 Initial encounter. EXAM: CT ANGIOGRAPHY HEAD AND NECK TECHNIQUE: Multidetector CT imaging of the head and neck was  performed using the standard protocol during bolus administration of intravenous contrast. Multiplanar CT image reconstructions and MIPs were obtained to evaluate the vascular anatomy. Carotid stenosis measurements (when applicable) are obtained utilizing NASCET criteria, using the distal internal carotid diameter as the denominator. CONTRAST:  50 mL Omnipaque 350 COMPARISON:  Head CT without contrast 1001 hours today. FINDINGS: CTA NECK Skeleton: Intermittent dental periapical lucency. No acute osseous abnormality identified. Visualized paranasal sinuses and mastoids are clear. Other neck: Negative lung apices other than dependent atelectasis. No superior mediastinal  lymphadenopathy. Thyroid, larynx, pharynx, parapharyngeal spaces, retropharyngeal space, sublingual space, submandibular glands, and parotid glands are within normal limits. No cervical lymphadenopathy. Aortic arch: 3 vessel arch configuration. Minimal distal arch calcified plaque. No great vessel origin stenosis. Right carotid system: Negative right CCA and right carotid bifurcation. Moderately tortuous proximal right ICA with a mildly kinked appearance just past the bulb. Otherwise negative cervical right ICA. Left carotid system: Negative left CCA. Soft plaque in the left ICA origin and bulb less than 50 % stenosis with respect to the distal vessel at the distal bulb. Tortuous cervical left ICA otherwise. Vertebral arteries:No proximal right subclavian artery stenosis. Normal right vertebral artery origin. Mildly dominant right vertebral artery. Soft plaque in the proximal left subclavian artery just proximal to the left vertebral artery origin with less than 50 % stenosis with respect to the distal vessel. The left vertebral artery origin appears normal. Mildly non dominant left vertebral artery appears normal to the skullbase. CTA HEAD Posterior circulation: Mildly dominant distal right vertebral artery. Normal PICA origins. No distal vertebral stenosis. Patent vertebrobasilar junction. Patent basilar artery without stenosis. SCA and left PCA origins are normal, fetal type right PCA origin. PCA branches are within normal limits. Anterior circulation: Both ICA siphons are patent without stenosis. Minimal calcified plaque in the right cavernous segment. Normal right posterior communicating artery origin. The left ICA terminus is normal. Filling defect at the right ICA terminus, MCA and ACA origin best seen on series 505, image 17. 7-8 mm segment of the right MCA M1 segment is nonenhancing, but with collateral enhancement at the right MCA bifurcation and sylvian division. The right ACA A1 segment is non dominant.  It remains patent likely supplied from the left. Normal left MCA and ACA origins. Dominant left A1. Anterior communicating artery and bilateral ACA branches are within normal limits. Left MCA M1 segment, bifurcation, and left MCA branches are within normal limits. Venous sinuses: Patent. Anatomic variants: Dominant left ACA A1 segment. Fetal type right PCA origin. Mildly dominant right vertebral artery. Right hemisphere gray-white matter differentiation appears stableand normal compared to 1001 hours today. IMPRESSION: 1. Positive for emergent large vessel occlusion: Occluded right ICA terminus, right MCA and right ACA origins. 7-8 mm occluded segment of the right M1 segment but good right MCA M2 and distal collaterals. Also, the left A1 is dominant such that bilateral A2 and distal ACA flow appears symmetric. Finding of ELVO at the [facility] origin reviewed in person with Dr. Roland Rack on 06/29/2015 at 1105 hours. 2. Mildly to moderately tortuous cervical ICAs. Mild plaque at the right ICA origin and bulb without stenosis. Minimal calcified right ICA siphon plaque. Moderate plaque at the distal left ICA bulb but without hemodynamically significant stenosis. 3. Proximal left subclavian artery soft plaque without significant stenosis. Negative posterior circulation. 4. Grossly stable CT appearance of the brain since 1001 hours. Electronically Signed   By: Genevie Ann M.D.   On: 06/29/2015 11:28   Ct  Head Wo Contrast  06/29/2015  CLINICAL DATA:  61 year old female status post neurointervention, clot retrieval for emergent right ICA terminus occlusion in the setting of infectious endocarditis. Initial encounter. EXAM: CT HEAD WITHOUT CONTRAST TECHNIQUE: Contiguous axial images were obtained from the base of the skull through the vertex without intravenous contrast. COMPARISON:  Cta head and neck 1039 hours today and earlier. FINDINGS: Fluid in the pharynx. Mild paranasal sinus mucosal thickening. Stable visualized  osseous structures. Stable orbit and scalp soft tissues. Residual intravascular contrast. Mild contrast staining along the course of the right MCA posterior division in the right temporal lobe and posterior insula. No CT changes of acute cortically based infarct are identified. No acute intracranial hemorrhage identified. No midline shift, mass effect, or evidence of intracranial mass lesion. No ventriculomegaly. Stable basilar cisterns. IMPRESSION: No adverse features status post Neurointervention for emergent right ICA terminus occlusion. No acute or developing right hemisphere infarct identified. Electronically Signed   By: Genevie Ann M.D.   On: 06/29/2015 15:55   Ct Head Wo Contrast  06/29/2015  CLINICAL DATA:  Code stroke.  Left-sided weakness and slurred speech EXAM: CT HEAD WITHOUT CONTRAST TECHNIQUE: Contiguous axial images were obtained from the base of the skull through the vertex without intravenous contrast. COMPARISON:  CT head 06/17/2015 FINDINGS: Ventricle size normal. Cerebral volume normal for age. Mild hypodensity in the periventricular white matter consistent with mild chronic microvascular ischemia. Negative for acute infarct.  Negative for acute hemorrhage or mass. Calvarium intact. IMPRESSION: Mild chronic microvascular ischemia.  No acute abnormality Critical Value/emergent results were called by telephone at the time of interpretation on 06/29/2015 at 10:13 am to Dr. Leonel Ramsay , who verbally acknowledged these results. Electronically Signed   By: Franchot Gallo M.D.   On: 06/29/2015 10:13   Ct Angio Neck W/cm &/or Wo/cm  06/29/2015  CLINICAL DATA:  60 year 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 Initial encounter. EXAM: CT ANGIOGRAPHY HEAD AND NECK TECHNIQUE: Multidetector CT imaging of the head and neck was performed using the standard protocol during bolus administration of intravenous contrast. Multiplanar CT image reconstructions and MIPs were obtained to  evaluate the vascular anatomy. Carotid stenosis measurements (when applicable) are obtained utilizing NASCET criteria, using the distal internal carotid diameter as the denominator. CONTRAST:  50 mL Omnipaque 350 COMPARISON:  Head CT without contrast 1001 hours today. FINDINGS: CTA NECK Skeleton: Intermittent dental periapical lucency. No acute osseous abnormality identified. Visualized paranasal sinuses and mastoids are clear. Other neck: Negative lung apices other than dependent atelectasis. No superior mediastinal lymphadenopathy. Thyroid, larynx, pharynx, parapharyngeal spaces, retropharyngeal space, sublingual space, submandibular glands, and parotid glands are within normal limits. No cervical lymphadenopathy. Aortic arch: 3 vessel arch configuration. Minimal distal arch calcified plaque. No great vessel origin stenosis. Right carotid system: Negative right CCA and right carotid bifurcation. Moderately tortuous proximal right ICA with a mildly kinked appearance just past the bulb. Otherwise negative cervical right ICA. Left carotid system: Negative left CCA. Soft plaque in the left ICA origin and bulb less than 50 % stenosis with respect to the distal vessel at the distal bulb. Tortuous cervical left ICA otherwise. Vertebral arteries:No proximal right subclavian artery stenosis. Normal right vertebral artery origin. Mildly dominant right vertebral artery. Soft plaque in the proximal left subclavian artery just proximal to the left vertebral artery origin with less than 50 % stenosis with respect to the distal vessel. The left vertebral artery origin appears normal. Mildly non dominant left vertebral artery appears  normal to the skullbase. CTA HEAD Posterior circulation: Mildly dominant distal right vertebral artery. Normal PICA origins. No distal vertebral stenosis. Patent vertebrobasilar junction. Patent basilar artery without stenosis. SCA and left PCA origins are normal, fetal type right PCA origin. PCA  branches are within normal limits. Anterior circulation: Both ICA siphons are patent without stenosis. Minimal calcified plaque in the right cavernous segment. Normal right posterior communicating artery origin. The left ICA terminus is normal. Filling defect at the right ICA terminus, MCA and ACA origin best seen on series 505, image 17. 7-8 mm segment of the right MCA M1 segment is nonenhancing, but with collateral enhancement at the right MCA bifurcation and sylvian division. The right ACA A1 segment is non dominant. It remains patent likely supplied from the left. Normal left MCA and ACA origins. Dominant left A1. Anterior communicating artery and bilateral ACA branches are within normal limits. Left MCA M1 segment, bifurcation, and left MCA branches are within normal limits. Venous sinuses: Patent. Anatomic variants: Dominant left ACA A1 segment. Fetal type right PCA origin. Mildly dominant right vertebral artery. Right hemisphere gray-white matter differentiation appears stableand normal compared to 1001 hours today. IMPRESSION: 1. Positive for emergent large vessel occlusion: Occluded right ICA terminus, right MCA and right ACA origins. 7-8 mm occluded segment of the right M1 segment but good right MCA M2 and distal collaterals. Also, the left A1 is dominant such that bilateral A2 and distal ACA flow appears symmetric. Finding of ELVO at the [facility] origin reviewed in person with Dr. Roland Rack on 06/29/2015 at 1105 hours. 2. Mildly to moderately tortuous cervical ICAs. Mild plaque at the right ICA origin and bulb without stenosis. Minimal calcified right ICA siphon plaque. Moderate plaque at the distal left ICA bulb but without hemodynamically significant stenosis. 3. Proximal left subclavian artery soft plaque without significant stenosis. Negative posterior circulation. 4. Grossly stable CT appearance of the brain since 1001 hours. Electronically Signed   By: Genevie Ann M.D.   On: 06/29/2015 11:28   Dg  Chest Port 1 View  06/30/2015  CLINICAL DATA:  Intubation. EXAM: PORTABLE CHEST 1 VIEW COMPARISON:  06/29/2015. FINDINGS: Endotracheal tube tip is at the thoracic inlet, advancement of approximately 3 -4 cm should be considered. NG tube noted with tip below left hemidiaphragm. Interim partial clearing of right upper lobe atelectasis. Cardiomegaly with mild pulmonary venous congestion and interstitial prominence. No pleural effusion or pneumothorax . IMPRESSION: 1. Endotracheal tube tip at the thoracic inlet, interim advancement of approximately 3-4 cm should be considered. NG tube in stable position. The 2. Interim partial clearing of right upper lobe atelectasis. 3. Cardiomegaly with mild pulmonary interstitial prominence. A mild component of congestive heart failure cannot be excluded. Electronically Signed   By: Marcello Moores  Register   On: 06/30/2015 07:59   Dg Chest Port 1 View  06/29/2015  CLINICAL DATA:  Endotracheal tube placement EXAM: PORTABLE CHEST 1 VIEW COMPARISON:  06/15/2015 FINDINGS: Endotracheal tube is approximately 6 cm above the carina and could be advanced 2 cm. NG tube enters stomach Right upper lobe collapse has developed since the prior study. Left lower lobe nodule better seen on prior studies. Negative for heart failure or effusion. IMPRESSION: Endotracheal tube is 6 cm above the carina, recommend advancing 2 cm Right upper lobe collapse Electronically Signed   By: Franchot Gallo M.D.   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 Not uncommon to see further emboli on 2-4 th  week of treatment. - will follow blood cx to see if can still isolate enterobacter  -macularpapupular rash = likely abtx associated rash. Patient had been discharged home with evidence of maculopapular rash to only her back states her husband, it was not until after she was admitted that her rash erupted to involve torso, extremities and neck. She was reintroduced to cephalosporins. It is difficult to tell which could have caused her rash.  - for now, would recommend to switch to carbapenem, and reconsider challenge with either FQ first to see if caused by fluoroquinolones. My suspicion that it possibly was cephalosporins.  - would recommend repeat TTE once she is stable, to see if having worsening valvular damage from vegetation, or if it still remains, and possible have CT surgery input.  I have discussed plan with her nurse and [place] [facility] [phone]

## 2015-06-30 NOTE — Progress Notes (Signed)
PT Cancellation Note  Patient Details Name: Becky Patterson MRN: [id number] DOB: May 21, 1956   Cancelled Treatment:    Reason Eval/Treat Not Completed: Patient not medically ready; patient not yet stable to start PT per RN.  Will attempt another day.  [name] 06/30/2015, 1:49 PM  [name], [place] 06/30/2015

## 2015-06-30 NOTE — Progress Notes (Addendum)
Patient ID: Becky Patterson, female   DOB: 1955/08/29, 60 y.o.   MRN: [id number]   RN noticed diffuse rash post prbc  Also patient on neo 120 for sbp goal > 130 but also on diprivan  Exam - diffuse maculopapular rash noticed   Recent Labs Lab 06/25/15 0651 06/26/15 0701 06/29/15 1014 06/30/15 0002  HGB 7.8* 8.5* 8.7*  9.5* 7.7*  HCT 23.2* 25.2* 25.9*  28.0* 22.7*  WBC 14.8* 16.2* 15.0*  --   PLT 348 386 390  --      A #RASH Likely cephalosporin related ??? Transfusion related  P Hold 2nd unit prbc now that Hgb > 7gm% Do transfusion reaction test For now ok for cephalosprin - id to gve opinion on this > 7am  #BP  - circulatory hypotension - d./w Dr Nicole Kindred of neuro -  - ok for sbp > 110 if we cannot get it to > 130 - dc diprivan - do 1L fluid bolus - do prn fent/versed for sedation  #acute post op resp failure - gets agitated on lower dose dipriovan and unable to extubate   30 min crtiical care time  Dr. Brand Males, M.D., [facility].C.P Pulmonary and Critical Care Medicine Staff Physician [place] [facility] Pager: [phone], If no answer or between  15:00h - 7:00h: call [phone]  06/30/2015 1:02 AM

## 2015-06-30 NOTE — Progress Notes (Signed)
abg collected

## 2015-06-30 NOTE — Progress Notes (Signed)
OT Cancellation Note  Patient Details Name: Becky Patterson MRN: [id number] DOB: 1955/10/20   Cancelled Treatment:    Reason Eval/Treat Not Completed: Patient not medically ready  [place], OTR/L  [phone] 06/30/2015 06/30/2015, 7:27 AM

## 2015-07-01 ENCOUNTER — Inpatient Hospital Stay (HOSPITAL_COMMUNITY): Payer: 59

## 2015-07-01 ENCOUNTER — Encounter (HOSPITAL_COMMUNITY): Payer: Self-pay | Admitting: Interventional Radiology

## 2015-07-01 DIAGNOSIS — I6310 Cerebral infarction due to embolism of unspecified precerebral artery: Secondary | ICD-10-CM

## 2015-07-01 DIAGNOSIS — J9601 Acute respiratory failure with hypoxia: Secondary | ICD-10-CM

## 2015-07-01 DIAGNOSIS — I959 Hypotension, unspecified: Secondary | ICD-10-CM

## 2015-07-01 DIAGNOSIS — G9340 Encephalopathy, unspecified: Secondary | ICD-10-CM

## 2015-07-01 LAB — GLUCOSE, CAPILLARY
Glucose-Capillary: 148 mg/dL — ABNORMAL HIGH (ref 65–99)
Glucose-Capillary: 145 mg/dL — ABNORMAL HIGH (ref 65–99)
Glucose-Capillary: 155 mg/dL — ABNORMAL HIGH (ref 65–99)
Glucose-Capillary: 134 mg/dL — ABNORMAL HIGH (ref 65–99)
Glucose-Capillary: 148 mg/dL — ABNORMAL HIGH (ref 65–99)
Glucose-Capillary: 140 mg/dL — ABNORMAL HIGH (ref 65–99)

## 2015-07-01 LAB — BLOOD GAS, ARTERIAL
Acid-base deficit: 4 mmol/L — ABNORMAL HIGH (ref 0.0–2.0)
BICARBONATE: 20.5 meq/L (ref 20.0–24.0)
Drawn by: 441661
FIO2: 0.4
O2 Saturation: 99.1 %
PATIENT TEMPERATURE: 98.6
PCO2 ART: 36.7 mmHg (ref 35.0–45.0)
PEEP: 5 cmH2O
RATE: 15 resp/min
TCO2: 21.6 mmol/L (ref 0–100)
VT: 410 mL
pH, Arterial: 7.366 (ref 7.350–7.450)
pO2, Arterial: 153 mmHg — ABNORMAL HIGH (ref 80.0–100.0)

## 2015-07-01 LAB — CBC
HCT: 24.4 % — ABNORMAL LOW (ref 36.0–46.0)
HEMOGLOBIN: 7.9 g/dL — AB (ref 12.0–15.0)
MCH: 29.8 pg (ref 26.0–34.0)
MCHC: 32.4 g/dL (ref 30.0–36.0)
MCV: 92.1 fL (ref 78.0–100.0)
PLATELETS: 358 10*3/uL (ref 150–400)
RBC: 2.65 MIL/uL — AB (ref 3.87–5.11)
RDW: 14.4 % (ref 11.5–15.5)
WBC: 14.8 10*3/uL — AB (ref 4.0–10.5)

## 2015-07-01 LAB — HEMOGLOBIN A1C
HEMOGLOBIN A1C: 6.3 % — AB (ref 4.8–5.6)
MEAN PLASMA GLUCOSE: 134 mg/dL

## 2015-07-01 LAB — MAGNESIUM: MAGNESIUM: 2.1 mg/dL (ref 1.7–2.4)

## 2015-07-01 LAB — BASIC METABOLIC PANEL
ANION GAP: 11 (ref 5–15)
BUN: 7 mg/dL (ref 6–20)
CO2: 21 mmol/L — ABNORMAL LOW (ref 22–32)
Calcium: 8.3 mg/dL — ABNORMAL LOW (ref 8.9–10.3)
Chloride: 105 mmol/L (ref 101–111)
Creatinine, Ser: 0.77 mg/dL (ref 0.44–1.00)
Glucose, Bld: 168 mg/dL — ABNORMAL HIGH (ref 65–99)
POTASSIUM: 3.6 mmol/L (ref 3.5–5.1)
SODIUM: 137 mmol/L (ref 135–145)

## 2015-07-01 LAB — SODIUM
SODIUM: 140 mmol/L (ref 135–145)
SODIUM: 150 mmol/L — AB (ref 135–145)
Sodium: 143 mmol/L (ref 135–145)

## 2015-07-01 LAB — PHOSPHORUS: PHOSPHORUS: 2.9 mg/dL (ref 2.5–4.6)

## 2015-07-01 MED ORDER — DIPHENHYDRAMINE HCL 50 MG/ML IJ SOLN
12.5000 mg | Freq: Once | INTRAMUSCULAR | Status: AC
  Administered 2015-07-01: 12.5 mg via INTRAVENOUS

## 2015-07-01 NOTE — Progress Notes (Signed)
[facility] Progress Note Patient Name: Becky Patterson DOB: [birth year] MRN: [id number]   Date of Service  07/01/2015  HPI/Events of Note  Pruritis - still itching after Benadryl 12.5 mg IV.  eICU Interventions  Will order Benadryl 12.5 mg IV as an extra dose now.      Intervention Category Intermediate Interventions: Other:  Sommer,Steven Cornelia Copa 07/01/2015, 5:03 AM

## 2015-07-01 NOTE — Progress Notes (Signed)
RT Note: Patient was transported to and from MRI on her rest mode. Patients ET Tube came out to 20cm during the transport but was placed back at 22cm which is where she was at initially. BBS-equal and good chest rise and volumes noted. Patient tolerated her transport well but did have some increased secretions after arriving back to the unit which were suctioned from patient without issues. Patient was placed back on her rest mode and continues to be on it presently. She is tolerating it well and RT will continue to monitor.

## 2015-07-01 NOTE — Progress Notes (Signed)
OT Cancellation Note  Patient Details Name: Becky Patterson MRN: [id number] DOB: [birth year]   Cancelled Treatment:    Reason Eval/Treat Not Completed: Patient not medically ready. Will try back tomorrow.   Darlina Rumpf [place], OTR/L [phone]  07/01/2015, 9:56 AM

## 2015-07-01 NOTE — Progress Notes (Signed)
   07/01/15 1107  Clinical Encounter Type  Visited With Patient and family together;Health care provider  Visit Type Initial   Chaplain met with patient and patient's family (daughter and husband) to introduce spiritual care services. Family indicated they are fine at this time and were complimentary of the care the patient is receiving. Chaplain offered support, and our services are available as needed.   Jeri Lager, Chaplain 07/01/2015 11:09 AM

## 2015-07-01 NOTE — Progress Notes (Signed)
PULMONARY / CRITICAL CARE MEDICINE   Name: Becky Patterson MRN: [id number] DOB: Apr 22, 1956    ADMISSION DATE:  06/29/2015 CONSULTATION DATE:  2/5  REFERRING MD:  Neuro -Kirpatrick   CHIEF COMPLAINT:  Vent management /CVA   HISTORY OF PRESENT ILLNESS:   Becky Patterson is a 60 y.o. female history of infective endocarditis that was treated with discharge on 2/2, continuing on antibiotics at home. She was being treated with fluoroquinolone. She was doing well until today when she had sudden onset of left-sided weakness that started around 6:30 AM. She woke up normal, with clear speech and no weakness and then developed slurred speech and hand weakness. She has progressively declined since that time and is currently having significant left-sided weakness as well as neglect. She was brought into the emergency room as bilateral arm weakness, but once here was recognized that it was left arm weakness and a code stroke was activated. She went for a CT, and once was recognized that she had infective endocarditis TPA was considered contraindicated. She was taken back for a CT angiogram which did show an M1 occlusion with some collateralization.  She was hypotensive and given a total of 1.5 L to try and assist her blood pressure. Being taken to interventional radiology per Dr. Estanislado Pandy.  SUBJECTIVE: No events overnight, off sedation.  VITAL SIGNS: BP 126/62 mmHg  Pulse 76  Temp(Src) 98.1 F (36.7 C) (Axillary)  Resp 19  Ht '5\' 2"'$  (1.575 m)  Wt 93 kg (205 lb 0.4 oz)  BMI 37.49 kg/m2  SpO2 100%  HEMODYNAMICS:    VENTILATOR SETTINGS: Vent Mode:  [-] CPAP;PSV FiO2 (%):  [40 %-100 %] 40 % Set Rate:  [15 bmp] 15 bmp Vt Set:  [410 mL] 410 mL PEEP:  [5 cmH20] 5 cmH20 Pressure Support:  [8 cmH20] 8 cmH20 Plateau Pressure:  [10 HCW23-76 cmH20] 12 cmH20  INTAKE / OUTPUT: I/O last 3 completed shifts: In: 8787.6 [I.V.:6194.6; Blood:331.3; NG/GT:331.8; IV Piggyback:1930] Out: 2831  [Urine:6390]  PHYSICAL EXAMINATION: General: Chronically ill appearing female, unresponsive off sedation and ventilated. Neuro:  Unresponsive off sedation, not withdrawing to pain. HEENT:  [place]/AT, PERRL, EOM-I and MMM. Cardiovascular:  RRR, NL S1/S2, -M/R/G. Lungs:  CTA bilaterally. Abdomen:  Soft, NT, ND and +BS. Musculoskeletal:  -edema and -tenderness. Skin:  Scattered bruises.  LABS:  BMET  Recent Labs Lab 06/29/15 1014 06/29/15 1438 06/30/15 0430  06/30/15 2224 07/01/15 0548 07/01/15 1205  NA 130*  132* 137 135  < > 137 137 140  K 3.9  3.8 3.2* 3.7  --   --  3.6  --   CL 94*  96*  --  104  --   --  105  --   CO2 24  --  21*  --   --  21*  --   BUN 8  8  --  <5*  --   --  7  --   CREATININE 1.04*  0.90  --  0.85  --   --  0.77  --   GLUCOSE 182*  176* 172* 148*  --   --  168*  --   < > = values in this interval not displayed. Electrolytes  Recent Labs Lab 06/29/15 1014 06/30/15 0430 07/01/15 0548  CALCIUM 8.4* 7.5* 8.3*  MG  --  1.6* 2.1  PHOS  --  3.2 2.9   CBC  Recent Labs Lab 06/29/15 1014  06/30/15 0002 06/30/15 0430 07/01/15 0548  WBC 15.0*  --   --  14.5* 14.8*  HGB 8.7*  9.5*  < > 7.7* 8.1* 7.9*  HCT 25.9*  28.0*  < > 22.7* 24.0* 24.4*  PLT 390  --   --  366 358  < > = values in this interval not displayed. Coag's  Recent Labs Lab 06/29/15 1014 06/30/15 0002  APTT 38* 32  INR 1.41 1.46   Sepsis Markers No results for input(s): LATICACIDVEN, PROCALCITON, O2SATVEN in the last 168 hours.  ABG  Recent Labs Lab 06/30/15 0435 07/01/15 0508  PHART 7.306* 7.366  PCO2ART 41.7 36.7  PO2ART 95.8 153*   Liver Enzymes  Recent Labs Lab 06/29/15 1014  AST 20  ALT 16  ALKPHOS 76  BILITOT 0.4  ALBUMIN 2.2*   Cardiac Enzymes No results for input(s): TROPONINI, PROBNP in the last 168 hours.  Glucose  Recent Labs Lab 06/30/15 1644 06/30/15 1936 06/30/15 2350 07/01/15 0337 07/01/15 0742 07/01/15 1231  GLUCAP 201* 179*  155* 148* 145* 155*   Imaging Ct Head Wo Contrast  06/30/2015  CLINICAL DATA:  Status post revascularization of the right middle cerebral artery. Left-sided paraplegia. EXAM: CT HEAD WITHOUT CONTRAST TECHNIQUE: Contiguous axial images were obtained from the base of the skull through the vertex without intravenous contrast. COMPARISON:  CT head without contrast 06/29/2015. FINDINGS: A developing large right MCA territory infarct is noted. This involves the posterior any inferior division. A hyperdense right MCA and posterior MCA branch vessel is noted. This is compatible with reocclusion. The right lentiform nucleus is infarcted. There is asymmetry of the right caudate head. No focal hemorrhage is present. 3 mm of midline shift is now present. There is partial effacement of the right lateral ventricle. The basal cisterns are intact. No focal left-sided infarct is present. There is no significant extra-axial fluid collection. IMPRESSION: 1. 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 2. Sparing of the superior right MCA division. This may be secondary to collaterals of the right anterior cerebral artery. 3. Nonhemorrhagic infarction within the basal ganglia. These results were called by telephone at the time of interpretation on 06/30/2015 at 3:38 pm to Dr. Antony Contras , who verbally acknowledged these results. Electronically Signed   By: San Morelle M.D.   On: 06/30/2015 15:38   Dg Chest Port 1 View  07/01/2015  CLINICAL DATA:  Check endotracheal tube placement EXAM: PORTABLE CHEST 1 VIEW COMPARISON:  06/30/2015 FINDINGS: Cardiac shadow is stable. An endotracheal tube is noted at least 10 cm above the carina. A nasogastric catheter is seen in the stomach. A right-sided PICC line is noted in the distal superior vena cava. No focal infiltrate or sizable effusion is seen. Previously seen right upper lobe infiltrate has resolved. No bony abnormality is noted. Old left rib fractures are seen.  IMPRESSION: Endotracheal to several cm above the carina as described. This could be advanced of 4-5 cm. Continued clearing of right upper lobe infiltrate. Electronically Signed   By: Inez Catalina M.D.   On: 07/01/2015 07:45   STUDIES:  CT chest 1/28 >2 cm LLL nodule  Echo 06/24/15 EF 60%, MV vegetation  CT head 2/5 >Positive for occlusion Right ICA/MCA , 7 mm occluded segment at M1 at right .   CULTURES:   ANTIBIOTICS: 2/5 Rocephin  2/5 Cipro>>>  SIGNIFICANT EVENTS: Admitted 1/22-2/2 for sepsis d/t pyelonephritis , bacteremia and endocarditis with splenic infarcts . Lung mass found on this admission.  LINES/TUBES:   DISCUSSION:  60  Yo female wth recent admission for sepsis with endocarditits  on prolonged abx at discharge followed by ID with acute CVA most likely from embolization despite abx therapy. She is currently in IR for thrombectomy.    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  Smoker  M P:   Begin PS trials but no extubation given mental status and pressor need. Daily WUA  Eval for daily SBT when ready  OP Pet scan has been set up.   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 P:  Primaxin. Neo for BP support. Hold outpatient anti-HTN.  RENAL A:  No active issues.  P:   Trend Bmet. Replace electrolytes as indicated. 3% saline.  GASTROINTESTINAL A:   No active issues  P:   PPI. Consult nutrition for TF as per nutrition.  HEMATOLOGIC A:   Anemia  P:  Tr cbc   INFECTIOUS A:   MV Endocarditis  P:   Primaxin.  ENDOCRINE A:   Hyperglycemia  P:   SSI.  NEUROLOGIC A:   Right M1 Occlusion s/p IR Thrombectomy  P:   RASS goal: -1 to 0  Monitor closely  D/C sedation. Neo for target SBP of 130 Further imaging  per neuro Sheaths out today 3% saline  FAMILY  - Updates: No family bedside.  - Inter-disciplinary family meet or Palliative Care meeting due by:  2/12  The patient is critically ill with multiple organ systems failure and requires high complexity decision making for assessment and support, frequent evaluation and titration of therapies, application of advanced monitoring technologies and extensive interpretation of multiple databases.   Critical Care Time devoted to patient care services described in this note is  45  Minutes. This time reflects time of care of this signee Dr Jennet Maduro. This critical care time does not reflect procedure time, or teaching time or supervisory time of PA/NP/Med student/Med Resident etc but could involve care discussion time.  Rush Farmer, M.D. [facility] Pulmonary/Critical Care Medicine. Pager: [phone]. After hours pager: [phone].  07/01/2015, 12:57 PM

## 2015-07-01 NOTE — Progress Notes (Signed)
Notified Dr Pat Kocher regarding patient's redrawn sodium level. Orders recieved to modify hypertonic solution rate. RN will continue to monitor.

## 2015-07-01 NOTE — Care Management Note (Signed)
Case Management Note  Patient Details  Name: LAKELYN STRAUS MRN: [id number] Date of Birth: 1955/10/17  Subjective/Objective:   Pt admitted on 06/29/15 s/p acute CVA.  PTA, pt independent, lives with spouse.                     Action/Plan: Will follow for discharge planning as pt progresses.  Pt currently remains sedated and on ventilator.    Expected Discharge Date:                  Expected Discharge Plan:  [place]  In-House Referral:     Discharge planning Services  CM Consult  Post Acute Care Choice:    Choice offered to:     DME Arranged:    DME Agency:     HH Arranged:    [place] Agency:     Status of Service:  In process, will continue to follow  Medicare Important Message Given:    Date Medicare IM Given:    Medicare IM give by:    Date Additional Medicare IM Given:    Additional Medicare Important Message give by:     If discussed at [place] of Stay Meetings, dates discussed:    Additional Comments:  Reinaldo Raddle, RN, BSN  Trauma/Neuro ICU Case Manager [phone]

## 2015-07-01 NOTE — Progress Notes (Signed)
PT Cancellation Note  Patient Details Name: Becky Patterson MRN: [id number] DOB: 1955/09/08   Cancelled Treatment:    Reason Eval/Treat Not Completed: Patient not medically ready.  Spoke with MD who indicates pt not appropriate at this time.  Will f/u tomorrow.     Catarina Hartshorn, [place] 07/01/2015, 9:53 AM

## 2015-07-01 NOTE — Progress Notes (Signed)
STROKE TEAM PROGRESS NOTE   SUBJECTIVE (INTERVAL HISTORY) Her family is at the bedside. Asking about diagnosis, prognosis and current treatment. Patient remains intubated. Started on 3% to decrease cerebral edema yesterday. Repeat CT scan of the head yesterday shows large right posterior division MCA infarct with cytotoxic edema and mild right to left brain herniation and mass effect on the right lateral ventricle.   OBJECTIVE Temp:  [97.8 F (36.6 C)-98.6 F (37 C)] 98.6 F (37 C) (02/07 0800) Pulse Rate:  [43-98] 88 (02/07 0900) Cardiac Rhythm:  [-] Normal sinus rhythm (02/07 0800) Resp:  [13-28] 17 (02/07 0900) BP: (103-149)/(51-86) 132/72 mmHg (02/07 0900) SpO2:  [100 %] 100 % (02/07 0900) FiO2 (%):  [40 %-100 %] 40 % (02/07 0510) Weight:  [93 kg (205 lb 0.4 oz)] 93 kg (205 lb 0.4 oz) (02/07 0443)  CBC:   Recent Labs Lab 06/29/15 1014  06/30/15 0430 07/01/15 0548  WBC 15.0*  --  14.5* 14.8*  NEUTROABS 11.9*  --  11.3*  --   HGB 8.7*  9.5*  < > 8.1* 7.9*  HCT 25.9*  28.0*  < > 24.0* 24.4*  MCV 92.8  --  92.3 92.1  PLT 390  --  366 358  < > = values in this interval not displayed.  Basic Metabolic Panel:   Recent Labs Lab 06/30/15 0430  06/30/15 2224 07/01/15 0548  NA 135  < > 137 137  K 3.7  --   --  3.6  CL 104  --   --  105  CO2 21*  --   --  21*  GLUCOSE 148*  --   --  168*  BUN <5*  --   --  7  CREATININE 0.85  --   --  0.77  CALCIUM 7.5*  --   --  8.3*  MG 1.6*  --   --  2.1  PHOS 3.2  --   --  2.9  < > = values in this interval not displayed.  Lipid Panel:     Component Value Date/Time   CHOL 64 06/30/2015 0430   TRIG 109 06/30/2015 0430   HDL 16* 06/30/2015 0430   CHOLHDL 4.0 06/30/2015 0430   VLDL 22 06/30/2015 0430   LDLCALC 26 06/30/2015 0430   HgbA1c:  Lab Results  Component Value Date   HGBA1C 6.3* 06/30/2015   Urine Drug Screen:     Component Value Date/Time   LABOPIA NONE DETECTED 06/29/2015 1048   COCAINSCRNUR NONE DETECTED  06/29/2015 1048   LABBENZ NONE DETECTED 06/29/2015 1048   AMPHETMU NONE DETECTED 06/29/2015 1048   THCU POSITIVE* 06/29/2015 1048   LABBARB NONE DETECTED 06/29/2015 1048      IMAGING  Ct Head Wo Contrast 07/02/2015  06/29/2105 1. 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 2. Sparing of the superior right MCA division. This may be secondary to collaterals of the right anterior cerebral artery. 3. Nonhemorrhagic infarction within the basal ganglia. 06/29/2015   No adverse features status post Neurointervention for emergent right ICA terminus occlusion. No acute or developing right hemisphere infarct identified.  06/29/2015   Mild chronic microvascular ischemia.  No acute abnormality   Ct Angio Head & Neck W/cm &/or Wo/cm 06/29/2015  1. Positive for emergent large vessel occlusion: Occluded right ICA terminus, right MCA and right ACA origins. 7-8 mm occluded segment of the right M1 segment but good right MCA M2 and distal collaterals. Also, the left A1 is dominant such that  bilateral A2 and distal ACA flow appears symmetric.  2. Mildly to moderately tortuous cervical ICAs. Mild plaque at the right ICA origin and bulb without stenosis. Minimal calcified right ICA siphon plaque. Moderate plaque at the distal left ICA bulb but without hemodynamically significant stenosis. 3. Proximal left subclavian artery soft plaque without significant stenosis. Negative posterior circulation. 4. Grossly stable CT appearance of the brain since 1001 hours.   Cerebral angiogram  06/29/2015 bilateral CCA and right vertebral artery arteriograms followed by attempted right ICA terminus and right MCA occlusion using 3 passes of the [employer] [place] stent retrieval device and 8 mg of superselective E Integrilin. Robust crass filling from left ICA versus ACom and R PCA leptomeningeals.  Dg Chest Port 1 View 06/30/2105 Endotracheal to several cm above the carina as described. This could be advanced of 4-5 cm. 06/29/2105 1.  Endotracheal tube tip at the thoracic inlet, interim advancement of approximately 3-4 cm should be considered. NG tube in stable position. The 2. Interim partial clearing of right upper lobe atelectasis. 3. Cardiomegaly with mild pulmonary interstitial prominence. A mild component of congestive heart failure cannot be excluded.  06/29/2015  Endotracheal tube is 6 cm above the carina, recommend advancing 2 cm Right upper lobe collapse     PHYSICAL EXAM Elderly caucasian lady who is intubated. . Afebrile. Head is nontraumatic. Neck is supple without bruit.    Cardiac exam no murmur or gallop. Lungs are clear to auscultation. Distal pulses are well felt. She has a diffuse maculopapular rash which is new Neurological Exam :  Drowsy but can be easily aroused. Right gaze preference unable to look to the left past midline. Blinks to threat on the right but not on the left. Pupils irregular but reactive. Fundi could not be visualized. Follows simple midline and commands on the right side consistently. No left hemi-neglect. Able to recognize her own left side. Able to point towards family members. Normal strength in the right. Left hemiplegia with only trace withdrawal to painful stimuli in the legs and none in the arm. Diminished on the left side. Right plantar downgoing left upgoing.   ASSESSMENT/PLAN Ms. DAVIONA HERBERT is a 60 y.o. female with history of infective endocarditis presenting with left arm weakness. She did not receive IV t-PA due to recent endocarditis. She was taken to IR where there was attempted revascularization of the right MCA occlusion using mechanical thrombectomy and intra-arterial Integrilin.   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   Resultant  VDRF, L hemiparesis, left gaze  paresis, left hemianopsia  CTA head and neck R ICA terminus, R ACA, RMCA occlusion  Cerebral angiogram R ICA terminus, R MCA occlusion, some flow from L ICA, L Acom, R PCA and leptomeningeals  Repeat CT 2/6 large R MCA terrirotry infarct sparing the superior R MCA division  Plan repeat CT 2/8 am  MRI  pending   TEE 06/24/2015 MV endocarditis, vegetation both leaflets but trivial MR  LDL 26, at goal < 70  HgbA1c 6.9 in Jan  SCDs for VTE prophylaxis Diet NPO time specified  No antithrombotic prior to admission, now on No antithrombotic due to endocarditis  Ongoing aggressive stroke risk factor management  Therapy recommendations:  pending   Disposition:  pending   Acute Respiratory Failure  intubated for neuro intervention  CCM following  Keep intubated today and consider wean tomorrow based on repeat CT results in am  Cytotoxic Cerebral Edema  Induced hypernatremia  Started on 3% to induce hypernatremia in order to decrease cerebral edema  Na 137  Increase 3% to 75 ml/hr  MV Endocarditis with splenic infarcts  dx 05/2015 with sepsis from enterococcus UTI, pyelo, bacteremia   on Cipro 750 mg bid since 06/25/15 (planned total 6 weeks treatment with fluoroquinolone, had been on rocephin 1/23 - 1/30 (no abx 1/31))  new CTX 2 mg IV q 24h this admission  Cipro and CTX changed to Primaxin  WBD 15.0-> 14.8  PICC line placed   Planned OP followup - by ID and Cardiology to monitor vegetation  Maculopapular Rash with itching  Over thorax  ? Transfusion reaction vs abx reaction. Unsure etiology  Rocephin and Cipro changed to Primaxin  Treated with Benadryl   Acute Blood Loss Anemia Transfusion Reaction Anemia of Chronic disease  Hgb 9.5 on admission, down to 7.9  Received 1 unit. 2nd unit held due to transfusion reaction of T 101.5  Rash following blood transfusion, unclear if related to blood or abx  Hyportension  On neo for SBP goal  110-130  Increase SBP goal to > 120   Wean neo  Lung Mass and Liver Mass  1.8 superior segment LLL lung mass and liver mass  Metastasis of unknown Primary vs hamartoma  oncology has not seen pt last admission - appt at [facility] 07/25/15 at 930a  OP PET is pending late Feb.   Diabetes  HgbA1c 6.9, at goal < 7.0  Other Stroke Risk Factors  smoker   THC, UDS positive this admission  Obesity, Body mass index is 37.49 kg/(m^2).   Other Active Problems  Hyponatremia  Hospital day # [place] for Pager information 07/01/2015 9:18 AM  I have personally examined this patient, reviewed notes, independently viewed imaging studies, participated in medical decision making and plan of care. I have made any additions or clarifications directly to the above note. Agree with note above. The patient remains critically ill and at significant risk for worsening cerebral edema and brain herniation. Continue hypertonic saline but increase the rate to 75 mL an hour to an goal of his serum sodium 150-155. I had a long discussion the bedside with the patient's husband and answered questions. Plan repeat CT scan of the head in the morning and if edema is stable and may consider extubation This patient is critically ill and at significant risk of neurological worsening, death and care requires constant monitoring of vital signs, hemodynamics,respiratory and cardiac monitoring, extensive review of multiple databases, frequent neurological assessment, discussion with family, other specialists and medical decision making of high complexity.I have made any additions or clarifications directly to the above note.This critical care time does not reflect procedure time, or teaching time or supervisory time of PA/NP/Med Resident etc but could involve care discussion time.  I spent 30 minutes of neurocritical care time  in the care of  this patient.      Antony Contras, MD Medical Director [facility] Pager: [phone] 07/01/2015 2:39 PM    To contact Stroke Continuity provider, please refer to [email or web address] After hours, contact General Neurology

## 2015-07-01 NOTE — Progress Notes (Signed)
Referring Physician(s): Cytogeneticist Complaint:  CVA R ICA/MCA clot retrieval 2/5  Subjective:  On vent Following me Trying to communicate Moving Rt side  Allergies: Codeine  Medications: Prior to Admission medications   Medication Sig Start Date End Date Taking? Authorizing Provider  acetaminophen (TYLENOL) 500 MG tablet Take 1,000 mg by mouth 2 (two) times daily as needed for mild pain or headache.   Yes Historical Provider, MD  ciprofloxacin (CIPRO) 750 MG tablet Take 1 tablet (750 mg total) by mouth 2 (two) times daily. 06/26/15  Yes Nita Sells, MD  ferrous sulfate 325 (65 FE) MG tablet Take 1 tablet (325 mg total) by mouth 2 (two) times daily with a meal. 06/26/15  Yes Nita Sells, MD  furosemide (LASIX) 40 MG tablet Take 1 tablet (40 mg total) by mouth daily. 06/26/15  Yes Nita Sells, MD  Hydrocodone-Acetaminophen 5-300 MG TABS Take 1 tablet by mouth every 6 (six) hours as needed (for pain). Reported on 06/15/2015 06/08/15  Yes Historical Provider, MD  naproxen sodium (ANAPROX) 220 MG tablet Take 440 mg by mouth 2 (two) times daily as needed (for pain).   Yes Historical Provider, MD  promethazine (PHENERGAN) 6.25 MG/5ML syrup Take 10 mLs (12.5 mg total) by mouth every 6 (six) hours as needed for nausea or vomiting. 06/26/15  Yes Nita Sells, MD  traMADol (ULTRAM) 50 MG tablet Take 1-2 tablets (50-100 mg total) by mouth every 6 (six) hours as needed for moderate pain. 06/26/15  Yes Nita Sells, MD     Vital Signs: BP 149/86 mmHg  Pulse 64  Temp(Src) 98.1 F (36.7 C) (Axillary)  Resp 15  Ht '5\' 2"'$  (1.575 m)  Wt 205 lb 0.4 oz (93 kg)  BMI 37.49 kg/m2  SpO2 100%  Physical Exam  Pulmonary/Chest:  vent  Abdominal: Soft.  Musculoskeletal:  Moves rt to command No response on Left  Rt groin NT no bleeding No hematoma Rt foot 1+ doppler pulse  Neurological:  follows me with eyes Tries to nod yes/no---appropriate   Skin: Skin is  warm and dry.  Nursing note and vitals reviewed.   Imaging: Ct Angio Head W/cm &/or Wo Cm  06/29/2015  CLINICAL DATA:  60 year 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 Initial encounter. EXAM: CT ANGIOGRAPHY HEAD AND NECK TECHNIQUE: Multidetector CT imaging of the head and neck was performed using the standard protocol during bolus administration of intravenous contrast. Multiplanar CT image reconstructions and MIPs were obtained to evaluate the vascular anatomy. Carotid stenosis measurements (when applicable) are obtained utilizing NASCET criteria, using the distal internal carotid diameter as the denominator. CONTRAST:  50 mL Omnipaque 350 COMPARISON:  Head CT without contrast 1001 hours today. FINDINGS: CTA NECK Skeleton: Intermittent dental periapical lucency. No acute osseous abnormality identified. Visualized paranasal sinuses and mastoids are clear. Other neck: Negative lung apices other than dependent atelectasis. No superior mediastinal lymphadenopathy. Thyroid, larynx, pharynx, parapharyngeal spaces, retropharyngeal space, sublingual space, submandibular glands, and parotid glands are within normal limits. No cervical lymphadenopathy. Aortic arch: 3 vessel arch configuration. Minimal distal arch calcified plaque. No great vessel origin stenosis. Right carotid system: Negative right CCA and right carotid bifurcation. Moderately tortuous proximal right ICA with a mildly kinked appearance just past the bulb. Otherwise negative cervical right ICA. Left carotid system: Negative left CCA. Soft plaque in the left ICA origin and bulb less than 50 % stenosis with respect to the distal vessel at the distal bulb. Tortuous cervical left ICA  otherwise. Vertebral arteries:No proximal right subclavian artery stenosis. Normal right vertebral artery origin. Mildly dominant right vertebral artery. Soft plaque in the proximal left subclavian artery just proximal to the left vertebral  artery origin with less than 50 % stenosis with respect to the distal vessel. The left vertebral artery origin appears normal. Mildly non dominant left vertebral artery appears normal to the skullbase. CTA HEAD Posterior circulation: Mildly dominant distal right vertebral artery. Normal PICA origins. No distal vertebral stenosis. Patent vertebrobasilar junction. Patent basilar artery without stenosis. SCA and left PCA origins are normal, fetal type right PCA origin. PCA branches are within normal limits. Anterior circulation: Both ICA siphons are patent without stenosis. Minimal calcified plaque in the right cavernous segment. Normal right posterior communicating artery origin. The left ICA terminus is normal. Filling defect at the right ICA terminus, MCA and ACA origin best seen on series 505, image 17. 7-8 mm segment of the right MCA M1 segment is nonenhancing, but with collateral enhancement at the right MCA bifurcation and sylvian division. The right ACA A1 segment is non dominant. It remains patent likely supplied from the left. Normal left MCA and ACA origins. Dominant left A1. Anterior communicating artery and bilateral ACA branches are within normal limits. Left MCA M1 segment, bifurcation, and left MCA branches are within normal limits. Venous sinuses: Patent. Anatomic variants: Dominant left ACA A1 segment. Fetal type right PCA origin. Mildly dominant right vertebral artery. Right hemisphere gray-white matter differentiation appears stableand normal compared to 1001 hours today. IMPRESSION: 1. Positive for emergent large vessel occlusion: Occluded right ICA terminus, right MCA and right ACA origins. 7-8 mm occluded segment of the right M1 segment but good right MCA M2 and distal collaterals. Also, the left A1 is dominant such that bilateral A2 and distal ACA flow appears symmetric. Finding of ELVO at the [facility] origin reviewed in person with Dr. Roland Rack on 06/29/2015 at 1105 hours. 2. Mildly to  moderately tortuous cervical ICAs. Mild plaque at the right ICA origin and bulb without stenosis. Minimal calcified right ICA siphon plaque. Moderate plaque at the distal left ICA bulb but without hemodynamically significant stenosis. 3. Proximal left subclavian artery soft plaque without significant stenosis. Negative posterior circulation. 4. Grossly stable CT appearance of the brain since 1001 hours. Electronically Signed   By: Genevie Ann M.D.   On: 06/29/2015 11:28   Ct Head Wo Contrast  06/30/2015  CLINICAL DATA:  Status post revascularization of the right middle cerebral artery. Left-sided paraplegia. EXAM: CT HEAD WITHOUT CONTRAST TECHNIQUE: Contiguous axial images were obtained from the base of the skull through the vertex without intravenous contrast. COMPARISON:  CT head without contrast 06/29/2015. FINDINGS: A developing large right MCA territory infarct is noted. This involves the posterior any inferior division. A hyperdense right MCA and posterior MCA branch vessel is noted. This is compatible with reocclusion. The right lentiform nucleus is infarcted. There is asymmetry of the right caudate head. No focal hemorrhage is present. 3 mm of midline shift is now present. There is partial effacement of the right lateral ventricle. The basal cisterns are intact. No focal left-sided infarct is present. There is no significant extra-axial fluid collection. IMPRESSION: 1. 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 2. Sparing of the superior right MCA division. This may be secondary to collaterals of the right anterior cerebral artery. 3. Nonhemorrhagic infarction within the basal ganglia. These results were called by telephone at the time of interpretation on 06/30/2015 at 3:38 pm to Dr.  PRAMOD SETHI , who verbally acknowledged these results. Electronically Signed   By: San Morelle M.D.   On: 06/30/2015 15:38   Ct Head Wo Contrast  06/29/2015  CLINICAL DATA:  60 year old female status post  neurointervention, clot retrieval for emergent right ICA terminus occlusion in the setting of infectious endocarditis. Initial encounter. EXAM: CT HEAD WITHOUT CONTRAST TECHNIQUE: Contiguous axial images were obtained from the base of the skull through the vertex without intravenous contrast. COMPARISON:  Cta head and neck 1039 hours today and earlier. FINDINGS: Fluid in the pharynx. Mild paranasal sinus mucosal thickening. Stable visualized osseous structures. Stable orbit and scalp soft tissues. Residual intravascular contrast. Mild contrast staining along the course of the right MCA posterior division in the right temporal lobe and posterior insula. No CT changes of acute cortically based infarct are identified. No acute intracranial hemorrhage identified. No midline shift, mass effect, or evidence of intracranial mass lesion. No ventriculomegaly. Stable basilar cisterns. IMPRESSION: No adverse features status post Neurointervention for emergent right ICA terminus occlusion. No acute or developing right hemisphere infarct identified. Electronically Signed   By: Genevie Ann M.D.   On: 06/29/2015 15:55   Ct Head Wo Contrast  06/29/2015  CLINICAL DATA:  Code stroke.  Left-sided weakness and slurred speech EXAM: CT HEAD WITHOUT CONTRAST TECHNIQUE: Contiguous axial images were obtained from the base of the skull through the vertex without intravenous contrast. COMPARISON:  CT head 06/17/2015 FINDINGS: Ventricle size normal. Cerebral volume normal for age. Mild hypodensity in the periventricular white matter consistent with mild chronic microvascular ischemia. Negative for acute infarct.  Negative for acute hemorrhage or mass. Calvarium intact. IMPRESSION: Mild chronic microvascular ischemia.  No acute abnormality Critical Value/emergent results were called by telephone at the time of interpretation on 06/29/2015 at 10:13 am to Dr. Leonel Ramsay , who verbally acknowledged these results. Electronically Signed   By: Franchot Gallo M.D.   On: 06/29/2015 10:13   Ct Angio Neck W/cm &/or Wo/cm  06/29/2015  CLINICAL DATA:  60 year 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 Initial encounter. EXAM: CT ANGIOGRAPHY HEAD AND NECK TECHNIQUE: Multidetector CT imaging of the head and neck was performed using the standard protocol during bolus administration of intravenous contrast. Multiplanar CT image reconstructions and MIPs were obtained to evaluate the vascular anatomy. Carotid stenosis measurements (when applicable) are obtained utilizing NASCET criteria, using the distal internal carotid diameter as the denominator. CONTRAST:  50 mL Omnipaque 350 COMPARISON:  Head CT without contrast 1001 hours today. FINDINGS: CTA NECK Skeleton: Intermittent dental periapical lucency. No acute osseous abnormality identified. Visualized paranasal sinuses and mastoids are clear. Other neck: Negative lung apices other than dependent atelectasis. No superior mediastinal lymphadenopathy. Thyroid, larynx, pharynx, parapharyngeal spaces, retropharyngeal space, sublingual space, submandibular glands, and parotid glands are within normal limits. No cervical lymphadenopathy. Aortic arch: 3 vessel arch configuration. Minimal distal arch calcified plaque. No great vessel origin stenosis. Right carotid system: Negative right CCA and right carotid bifurcation. Moderately tortuous proximal right ICA with a mildly kinked appearance just past the bulb. Otherwise negative cervical right ICA. Left carotid system: Negative left CCA. Soft plaque in the left ICA origin and bulb less than 50 % stenosis with respect to the distal vessel at the distal bulb. Tortuous cervical left ICA otherwise. Vertebral arteries:No proximal right subclavian artery stenosis. Normal right vertebral artery origin. Mildly dominant right vertebral artery. Soft plaque in the proximal left subclavian artery just proximal to the left vertebral artery origin with  less than 50  % stenosis with respect to the distal vessel. The left vertebral artery origin appears normal. Mildly non dominant left vertebral artery appears normal to the skullbase. CTA HEAD Posterior circulation: Mildly dominant distal right vertebral artery. Normal PICA origins. No distal vertebral stenosis. Patent vertebrobasilar junction. Patent basilar artery without stenosis. SCA and left PCA origins are normal, fetal type right PCA origin. PCA branches are within normal limits. Anterior circulation: Both ICA siphons are patent without stenosis. Minimal calcified plaque in the right cavernous segment. Normal right posterior communicating artery origin. The left ICA terminus is normal. Filling defect at the right ICA terminus, MCA and ACA origin best seen on series 505, image 17. 7-8 mm segment of the right MCA M1 segment is nonenhancing, but with collateral enhancement at the right MCA bifurcation and sylvian division. The right ACA A1 segment is non dominant. It remains patent likely supplied from the left. Normal left MCA and ACA origins. Dominant left A1. Anterior communicating artery and bilateral ACA branches are within normal limits. Left MCA M1 segment, bifurcation, and left MCA branches are within normal limits. Venous sinuses: Patent. Anatomic variants: Dominant left ACA A1 segment. Fetal type right PCA origin. Mildly dominant right vertebral artery. Right hemisphere gray-white matter differentiation appears stableand normal compared to 1001 hours today. IMPRESSION: 1. Positive for emergent large vessel occlusion: Occluded right ICA terminus, right MCA and right ACA origins. 7-8 mm occluded segment of the right M1 segment but good right MCA M2 and distal collaterals. Also, the left A1 is dominant such that bilateral A2 and distal ACA flow appears symmetric. Finding of ELVO at the [facility] origin reviewed in person with Dr. Roland Rack on 06/29/2015 at 1105 hours. 2. Mildly to moderately tortuous cervical ICAs.  Mild plaque at the right ICA origin and bulb without stenosis. Minimal calcified right ICA siphon plaque. Moderate plaque at the distal left ICA bulb but without hemodynamically significant stenosis. 3. Proximal left subclavian artery soft plaque without significant stenosis. Negative posterior circulation. 4. Grossly stable CT appearance of the brain since 1001 hours. Electronically Signed   By: Genevie Ann M.D.   On: 06/29/2015 11:28   Dg Chest Port 1 View  07/01/2015  CLINICAL DATA:  Check endotracheal tube placement EXAM: PORTABLE CHEST 1 VIEW COMPARISON:  06/30/2015 FINDINGS: Cardiac shadow is stable. An endotracheal tube is noted at least 10 cm above the carina. A nasogastric catheter is seen in the stomach. A right-sided PICC line is noted in the distal superior vena cava. No focal infiltrate or sizable effusion is seen. Previously seen right upper lobe infiltrate has resolved. No bony abnormality is noted. Old left rib fractures are seen. IMPRESSION: Endotracheal to several cm above the carina as described. This could be advanced of 4-5 cm. Continued clearing of right upper lobe infiltrate. Electronically Signed   By: Inez Catalina M.D.   On: 07/01/2015 07:45   Dg Chest Port 1 View  06/30/2015  CLINICAL DATA:  Intubation. EXAM: PORTABLE CHEST 1 VIEW COMPARISON:  06/29/2015. FINDINGS: Endotracheal tube tip is at the thoracic inlet, advancement of approximately 3 -4 cm should be considered. NG tube noted with tip below left hemidiaphragm. Interim partial clearing of right upper lobe atelectasis. Cardiomegaly with mild pulmonary venous congestion and interstitial prominence. No pleural effusion or pneumothorax . IMPRESSION: 1. Endotracheal tube tip at the thoracic inlet, interim advancement of approximately 3-4 cm should be considered. NG tube in stable position. The 2. Interim partial clearing of right upper  lobe atelectasis. 3. Cardiomegaly with mild pulmonary interstitial prominence. A mild component of  congestive heart failure cannot be excluded. Electronically Signed   By: Marcello Moores  Register   On: 06/30/2015 07:59   Dg Chest Port 1 View  06/29/2015  CLINICAL DATA:  Endotracheal tube placement EXAM: PORTABLE CHEST 1 VIEW COMPARISON:  06/15/2015 FINDINGS: Endotracheal tube is approximately 6 cm above the carina and could be advanced 2 cm. NG tube enters stomach Right upper lobe collapse has developed since the prior study. Left lower lobe nodule better seen on prior studies. Negative for heart failure or effusion. IMPRESSION: Endotracheal tube is 6 cm above the carina, recommend advancing 2 cm Right upper lobe collapse Electronically Signed   By: Franchot Gallo M.D.   On: 06/29/2015 17:55    Labs:  CBC:  Recent Labs  06/26/15 0701 06/29/15 1014 06/29/15 1438 06/30/15 0002 06/30/15 0430 07/01/15 0548  WBC 16.2* 15.0*  --   --  14.5* 14.8*  HGB 8.5* 8.7*  9.5* 6.8* 7.7* 8.1* 7.9*  HCT 25.2* 25.9*  28.0* 20.0* 22.7* 24.0* 24.4*  PLT 386 390  --   --  366 358    COAGS:  Recent Labs  06/16/15 0920 06/17/15 0527 06/29/15 1014 06/30/15 0002  INR 1.52* 1.45 1.41 1.46  APTT 34  --  38* 32    BMP:  Recent Labs  06/24/15 0545 06/29/15 1014 06/29/15 1438 06/30/15 0430 06/30/15 1627 06/30/15 2224 07/01/15 0548  NA 134* 130*  132* 137 135 129* 137 137  K 4.2 3.9  3.8 3.2* 3.7  --   --  3.6  CL 98* 94*  96*  --  104  --   --  105  CO2 25 24  --  21*  --   --  21*  GLUCOSE 133* 182*  176* 172* 148*  --   --  168*  BUN '6 8  8  '$ --  <5*  --   --  7  CALCIUM 8.4* 8.4*  --  7.5*  --   --  8.3*  CREATININE 0.93 1.04*  0.90  --  0.85  --   --  0.77  GFRNONAA >60 57*  --  >60  --   --  >60  GFRAA >60 >60  --  >60  --   --  >60    LIVER FUNCTION TESTS:  Recent Labs  06/15/15 1520 06/17/15 0527 06/29/15 1014  BILITOT 0.6 0.3 0.4  AST '30 20 20  '$ ALT '30 27 16  '$ ALKPHOS 115 79 76  PROT 6.9 5.4* 6.4*  ALBUMIN 2.4* 1.9* 2.2*    Assessment and Plan:  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

## 2015-07-01 NOTE — Progress Notes (Signed)
[place] for Infectious Disease    Date of Admission:  06/29/2015   Total days of antibiotics 15        Day 2 imipenem           ID: Becky Patterson is a 60 y.o. 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 She is following commands.family reports little movement to left side  Interval hx: had repeat brain nchct which showed, patient was alert with family writing her husband's name on paper.   developing large right MCA territory infarct is noted. This involves the posterior any inferior division. A hyperdense right MCA and posterior MCA branch vessel is noted. This is compatible with reocclusion. The right lentiform nucleus is infarcted. There is asymmetry of the right caudate head.  No focal hemorrhage is present. 3 mm of midline shift is now present. There is partial effacement of the right lateral ventricle. The basal cisterns are intact.  No focal left-sided infarct is present. There is no significant extra-axial fluid collection.   Medications:  . antiseptic oral rinse  7 mL Mouth Rinse 10 times per day  . chlorhexidine gluconate  15 mL Mouth Rinse BID  . feeding supplement (VITAL HIGH PROTEIN)  1,000 mL Per Tube Q24H  . imipenem-cilastatin  500 mg Intravenous Q6H  . insulin aspart  0-15 Units Subcutaneous 6 times per day  . multivitamin with minerals  1 tablet Per Tube Daily  . pantoprazole (PROTONIX) IV  40 mg Intravenous Q24H  . sodium chloride flush  10-40 mL Intracatheter Q12H    Objective: Vital signs in  last 24 hours: Temp:  [97.8 F (36.6 C)-98.4 F (36.9 C)] 98.1 F (36.7 C) (02/07 0400) Pulse Rate:  [43-98] 68 (02/07 0800) Resp:  [13-28] 15 (02/07 0800) BP: (103-149)/(51-86) 142/72 mmHg (02/07 0800) SpO2:  [100 %] 100 % (02/07 0800) Arterial Line BP: (131)/(74) 131/74 mmHg (02/06 0900) FiO2 (%):  [40 %-100 %] 40 % (02/07 0510) Weight:  [205 lb 0.4 oz (93 kg)] 205 lb 0.4 oz (93 kg) (02/07 0443) Physical Exam  Constitutional:  Sedated, intubated. appears well-developed and well-nourished. No distress.  HENT: [place]/AT, PERRLA, no scleral icterus Mouth/Throat: Oropharynx is clear and moist. No oropharyngeal exudate.  Cardiovascular: Normal rate, regular rhythm and normal heart sounds. Exam reveals no gallop and no friction rub.  No murmur heard.  Pulmonary/Chest: Effort normal and breath sounds normal. No respiratory distress.  has no wheezes.  Abdominal: Soft. Bowel sounds are normal.  exhibits no distension. There is no tenderness.  Skin: Skin is warm and dry. Trace rash on right arm  Lab Results  Recent Labs  06/30/15 0430  06/30/15 2224 07/01/15 0548  WBC 14.5*  --   --  14.8*  HGB 8.1*  --   --  7.9*  HCT 24.0*  --   --  24.4*  NA 135  < > 137 137  K 3.7  --   --  3.6  CL 104  --   --  105  CO2 21*  --   --  21*  BUN <5*  --   --  7  CREATININE 0.85  --   --  0.77  < > = values in this interval not displayed. Liver Panel  Recent Labs  06/29/15 1014  PROT 6.4*  ALBUMIN 2.2*  AST 20  ALT 16  ALKPHOS 91  BILITOT 0.4    Microbiology: 2/5blood cx ngtd Studies/Results: Ct Angio Head W/cm &/or Wo Cm  06/29/2015  CLINICAL DATA:  60 year 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 Initial encounter. EXAM: CT ANGIOGRAPHY HEAD AND NECK TECHNIQUE: Multidetector CT imaging of the head and neck was performed using the standard protocol during bolus administration of intravenous contrast. Multiplanar CT image reconstructions and MIPs were obtained  to evaluate the vascular anatomy. Carotid stenosis measurements (when applicable) are obtained utilizing NASCET criteria, using the distal internal carotid diameter as the denominator. CONTRAST:  50 mL Omnipaque 350 COMPARISON:  Head CT without contrast 1001 hours today. FINDINGS: CTA NECK Skeleton: Intermittent dental periapical lucency. No acute osseous abnormality identified. Visualized paranasal sinuses and mastoids are clear. Other neck: Negative lung apices other than dependent atelectasis. No superior mediastinal lymphadenopathy. Thyroid, larynx, pharynx, parapharyngeal spaces, retropharyngeal space, sublingual space, submandibular glands, and parotid glands are within normal limits. No cervical lymphadenopathy. Aortic arch: 3 vessel arch configuration. Minimal distal arch calcified plaque. No great vessel origin stenosis. Right carotid system: Negative right CCA and right carotid bifurcation. Moderately tortuous proximal right ICA with a mildly kinked appearance just past the bulb. Otherwise negative cervical right ICA. Left carotid system: Negative left CCA. Soft plaque in the left ICA origin and bulb less than 50 % stenosis with respect to the distal vessel at the distal bulb. Tortuous cervical left ICA otherwise. Vertebral arteries:No proximal right subclavian artery stenosis. Normal right vertebral artery origin. Mildly dominant right vertebral artery. Soft plaque in the proximal left subclavian artery just proximal to the left vertebral artery origin with less than 50 % stenosis with respect to the distal vessel. The left vertebral artery origin appears normal. Mildly non dominant left vertebral artery appears normal to the skullbase. CTA HEAD Posterior circulation: Mildly dominant distal right vertebral artery. Normal PICA origins. No distal vertebral stenosis. Patent vertebrobasilar junction. Patent basilar artery without stenosis. SCA and left PCA origins are normal, fetal type right PCA origin. PCA  branches are within normal limits. Anterior circulation: Both ICA siphons are patent without stenosis. Minimal calcified plaque in the right cavernous segment. Normal right posterior communicating artery origin. The left ICA terminus is normal. Filling defect at the right ICA terminus, MCA and ACA origin best seen on series 505, image 17. 7-8 mm segment of the right MCA M1 segment is nonenhancing, but with collateral enhancement at the right MCA bifurcation and sylvian division. The right ACA A1 segment is non dominant. It remains patent likely supplied from the left. Normal left MCA and ACA origins. Dominant left A1. Anterior communicating artery and bilateral ACA branches are within normal limits. Left MCA M1 segment, bifurcation, and left MCA branches are within normal limits. Venous sinuses: Patent. Anatomic variants: Dominant left ACA A1 segment. Fetal type right PCA origin. Mildly dominant right vertebral artery. Right hemisphere gray-white matter differentiation appears stableand normal compared to 1001 hours today. IMPRESSION: 1. Positive for emergent large vessel occlusion: Occluded right ICA terminus, right MCA and right ACA origins. 7-8 mm occluded segment of the right M1 segment but good right MCA M2 and distal collaterals. Also, the left A1 is dominant such that bilateral  A2 and distal ACA flow appears symmetric. Finding of ELVO at the [facility] origin reviewed in person with Dr. Roland Rack on 06/29/2015 at 1105 hours. 2. Mildly to moderately tortuous cervical ICAs. Mild plaque at the right ICA origin and bulb without stenosis. Minimal calcified right ICA siphon plaque. Moderate plaque at the distal left ICA bulb but without hemodynamically significant stenosis. 3. Proximal left subclavian artery soft plaque without significant stenosis. Negative posterior circulation. 4. Grossly stable CT appearance of the brain since 1001 hours. Electronically Signed   By: Genevie Ann M.D.   On: 06/29/2015 11:28   Ct  Head Wo Contrast  06/30/2015  CLINICAL DATA:  Status post revascularization of the right middle cerebral artery. Left-sided paraplegia. EXAM: CT HEAD WITHOUT CONTRAST TECHNIQUE: Contiguous axial images were obtained from the base of the skull through the vertex without intravenous contrast. COMPARISON:  CT head without contrast 06/29/2015. FINDINGS: A developing large right MCA territory infarct is noted. This involves the posterior any inferior division. A hyperdense right MCA and posterior MCA branch vessel is noted. This is compatible with reocclusion. The right lentiform nucleus is infarcted. There is asymmetry of the right caudate head. No focal hemorrhage is present. 3 mm of midline shift is now present. There is partial effacement of the right lateral ventricle. The basal cisterns are intact. No focal left-sided infarct is present. There is no significant extra-axial fluid collection. IMPRESSION: 1. 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 2. Sparing of the superior right MCA division. This may be secondary to collaterals of the right anterior cerebral artery. 3. Nonhemorrhagic infarction within the basal ganglia. These results were called by telephone at the time of interpretation on 06/30/2015 at 3:38 pm to Dr. Antony Contras , who verbally acknowledged these results. Electronically Signed   By: San Morelle M.D.   On: 06/30/2015 15:38   Ct Head Wo Contrast  06/29/2015  CLINICAL DATA:  60 year old female status post neurointervention, clot retrieval for emergent right ICA terminus occlusion in the setting of infectious endocarditis. Initial encounter. EXAM: CT HEAD WITHOUT CONTRAST TECHNIQUE: Contiguous axial images were obtained from the base of the skull through the vertex without intravenous contrast. COMPARISON:  Cta head and neck 1039 hours today and earlier. FINDINGS: Fluid in the pharynx. Mild paranasal sinus mucosal thickening. Stable visualized osseous structures. Stable orbit  and scalp soft tissues. Residual intravascular contrast. Mild contrast staining along the course of the right MCA posterior division in the right temporal lobe and posterior insula. No CT changes of acute cortically based infarct are identified. No acute intracranial hemorrhage identified. No midline shift, mass effect, or evidence of intracranial mass lesion. No ventriculomegaly. Stable basilar cisterns. IMPRESSION: No adverse features status post Neurointervention for emergent right ICA terminus occlusion. No acute or developing right hemisphere infarct identified. Electronically Signed   By: Genevie Ann M.D.   On: 06/29/2015 15:55   Ct Head Wo Contrast  06/29/2015  CLINICAL DATA:  Code stroke.  Left-sided weakness and slurred speech EXAM: CT HEAD WITHOUT CONTRAST TECHNIQUE: Contiguous axial images were obtained from the base of the skull through the vertex without intravenous contrast. COMPARISON:  CT head 06/17/2015 FINDINGS: Ventricle size normal. Cerebral volume normal for age. Mild hypodensity in the periventricular white matter consistent with mild chronic microvascular ischemia. Negative for acute infarct.  Negative for acute hemorrhage or mass. Calvarium intact. IMPRESSION: Mild chronic microvascular ischemia.  No acute abnormality Critical Value/emergent results were called by telephone at the time of interpretation on  06/29/2015 at 10:13 am to Dr. Leonel Ramsay , who verbally acknowledged these results. Electronically Signed   By: Franchot Gallo M.D.   On: 06/29/2015 10:13   Ct Angio Neck W/cm &/or Wo/cm  06/29/2015  CLINICAL DATA:  60 year 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 Initial encounter. EXAM: CT ANGIOGRAPHY HEAD AND NECK TECHNIQUE: Multidetector CT imaging of the head and neck was performed using the standard protocol during bolus administration of intravenous contrast. Multiplanar CT image reconstructions and MIPs were obtained to evaluate the vascular anatomy.  Carotid stenosis measurements (when applicable) are obtained utilizing NASCET criteria, using the distal internal carotid diameter as the denominator. CONTRAST:  50 mL Omnipaque 350 COMPARISON:  Head CT without contrast 1001 hours today. FINDINGS: CTA NECK Skeleton: Intermittent dental periapical lucency. No acute osseous abnormality identified. Visualized paranasal sinuses and mastoids are clear. Other neck: Negative lung apices other than dependent atelectasis. No superior mediastinal lymphadenopathy. Thyroid, larynx, pharynx, parapharyngeal spaces, retropharyngeal space, sublingual space, submandibular glands, and parotid glands are within normal limits. No cervical lymphadenopathy. Aortic arch: 3 vessel arch configuration. Minimal distal arch calcified plaque. No great vessel origin stenosis. Right carotid system: Negative right CCA and right carotid bifurcation. Moderately tortuous proximal right ICA with a mildly kinked appearance just past the bulb. Otherwise negative cervical right ICA. Left carotid system: Negative left CCA. Soft plaque in the left ICA origin and bulb less than 50 % stenosis with respect to the distal vessel at the distal bulb. Tortuous cervical left ICA otherwise. Vertebral arteries:No proximal right subclavian artery stenosis. Normal right vertebral artery origin. Mildly dominant right vertebral artery. Soft plaque in the proximal left subclavian artery just proximal to the left vertebral artery origin with less than 50 % stenosis with respect to the distal vessel. The left vertebral artery origin appears normal. Mildly non dominant left vertebral artery appears normal to the skullbase. CTA HEAD Posterior circulation: Mildly dominant distal right vertebral artery. Normal PICA origins. No distal vertebral stenosis. Patent vertebrobasilar junction. Patent basilar artery without stenosis. SCA and left PCA origins are normal, fetal type right PCA origin. PCA branches are within normal limits.  Anterior circulation: Both ICA siphons are patent without stenosis. Minimal calcified plaque in the right cavernous segment. Normal right posterior communicating artery origin. The left ICA terminus is normal. Filling defect at the right ICA terminus, MCA and ACA origin best seen on series 505, image 17. 7-8 mm segment of the right MCA M1 segment is nonenhancing, but with collateral enhancement at the right MCA bifurcation and sylvian division. The right ACA A1 segment is non dominant. It remains patent likely supplied from the left. Normal left MCA and ACA origins. Dominant left A1. Anterior communicating artery and bilateral ACA branches are within normal limits. Left MCA M1 segment, bifurcation, and left MCA branches are within normal limits. Venous sinuses: Patent. Anatomic variants: Dominant left ACA A1 segment. Fetal type right PCA origin. Mildly dominant right vertebral artery. Right hemisphere gray-white matter differentiation appears stableand normal compared to 1001 hours today. IMPRESSION: 1. Positive for emergent large vessel occlusion: Occluded right ICA terminus, right MCA and right ACA origins. 7-8 mm occluded segment of the right M1 segment but good right MCA M2 and distal collaterals. Also, the left A1 is dominant such that bilateral A2 and distal ACA flow appears symmetric. [facility] origin reviewed in person with Dr. Roland Rack on 06/29/2015 at 1105 hours. 2. Mildly to moderately tortuous cervical ICAs. Mild plaque  at the right ICA origin and bulb without stenosis. Minimal calcified right ICA siphon plaque. Moderate plaque at the distal left ICA bulb but without hemodynamically significant stenosis. 3. Proximal left subclavian artery soft plaque without significant stenosis. Negative posterior circulation. 4. Grossly stable CT appearance of the brain since 1001 hours. Electronically Signed   By: Genevie Ann M.D.   On: 06/29/2015 11:28   Dg Chest Port 1 View  07/01/2015   CLINICAL DATA:  Check endotracheal tube placement EXAM: PORTABLE CHEST 1 VIEW COMPARISON:  06/30/2015 FINDINGS: Cardiac shadow is stable. An endotracheal tube is noted at least 10 cm above the carina. A nasogastric catheter is seen in the stomach. A right-sided PICC line is noted in the distal superior vena cava. No focal infiltrate or sizable effusion is seen. Previously seen right upper lobe infiltrate has resolved. No bony abnormality is noted. Old left rib fractures are seen. IMPRESSION: Endotracheal to several cm above the carina as described. This could be advanced of 4-5 cm. Continued clearing of right upper lobe infiltrate. Electronically Signed   By: Inez Catalina M.D.   On: 07/01/2015 07:45   Dg Chest Port 1 View  06/30/2015  CLINICAL DATA:  Intubation. EXAM: PORTABLE CHEST 1 VIEW COMPARISON:  06/29/2015. FINDINGS: Endotracheal tube tip is at the thoracic inlet, advancement of approximately 3 -4 cm should be considered. NG tube noted with tip below left hemidiaphragm. Interim partial clearing of right upper lobe atelectasis. Cardiomegaly with mild pulmonary venous congestion and interstitial prominence. No pleural effusion or pneumothorax . IMPRESSION: 1. Endotracheal tube tip at the thoracic inlet, interim advancement of approximately 3-4 cm should be considered. NG tube in stable position. The 2. Interim partial clearing of right upper lobe atelectasis. 3. Cardiomegaly with mild pulmonary interstitial prominence. A mild component of congestive heart failure cannot be excluded. Electronically Signed   By: Marcello Moores  Register   On: 06/30/2015 07:59   Dg Chest Port 1 View  06/29/2015  CLINICAL DATA:  Endotracheal tube placement EXAM: PORTABLE CHEST 1 VIEW COMPARISON:  06/15/2015 FINDINGS: Endotracheal tube is approximately 6 cm above the carina and could be advanced 2 cm. NG tube enters stomach Right upper lobe collapse has developed since the prior study. Left lower lobe nodule better seen on prior  studies. Negative for heart failure or effusion. IMPRESSION: Endotracheal tube is 6 cm above the carina, recommend advancing 2 cm Right upper lobe collapse Electronically Signed   By: Franchot Gallo M.D.   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 Patient has evolving large right mca territory infarct as well as small infarct to basal ganglia with cerebral edema  - continue with imipenem  - this is not necessarily treatment failure but likely risk factor of medical management of MV endocarditis. Not uncommon to see further emboli on 2-4 th week of treatment. - will follow blood cx to see if can still isolate enterobacter  -macularpapupular rash = likely abtx associated rash. Continue with symptomatic management. Patient is now on carbapenem should only have small risk of cross reacting.  - would recommend repeat TTE once she is stable, to see if having worsening valvular damage from vegetation, or if it still remains, and possible have CT surgery input.  [place], [facility]  [facility] Cell: [phone] Pager: [phone]  07/01/2015, 8:57 AM

## 2015-07-01 NOTE — Progress Notes (Signed)
abg collected

## 2015-07-02 ENCOUNTER — Inpatient Hospital Stay (HOSPITAL_COMMUNITY): Payer: 59

## 2015-07-02 DIAGNOSIS — Z515 Encounter for palliative care: Secondary | ICD-10-CM

## 2015-07-02 DIAGNOSIS — I63031 Cerebral infarction due to thrombosis of right carotid artery: Secondary | ICD-10-CM

## 2015-07-02 DIAGNOSIS — Z7189 Other specified counseling: Secondary | ICD-10-CM

## 2015-07-02 LAB — BASIC METABOLIC PANEL
ANION GAP: 7 (ref 5–15)
BUN: 12 mg/dL (ref 6–20)
CHLORIDE: 112 mmol/L — AB (ref 101–111)
CO2: 25 mmol/L (ref 22–32)
Calcium: 8.3 mg/dL — ABNORMAL LOW (ref 8.9–10.3)
Creatinine, Ser: 0.75 mg/dL (ref 0.44–1.00)
GFR calc Af Amer: >60 mL/min (ref >60)
GFR calc non Af Amer: >60 mL/min (ref >60)
GLUCOSE: 149 mg/dL — AB (ref 65–99)
POTASSIUM: 3.4 mmol/L — AB (ref 3.5–5.1)
Sodium: 144 mmol/L (ref 135–145)

## 2015-07-02 LAB — BLOOD GAS, ARTERIAL
Acid-base deficit: 1 mmol/L (ref 0.0–2.0)
BICARBONATE: 22.8 meq/L (ref 20.0–24.0)
Drawn by: 232811
FIO2: 0.4
LHR: 15 {breaths}/min
O2 Saturation: 99.2 %
PATIENT TEMPERATURE: 99.3
PCO2 ART: 35.8 mmHg (ref 35.0–45.0)
PEEP: 5 cmH2O
TCO2: 23.8 mmol/L (ref 0–100)
VT: 410 mL
pH, Arterial: 7.422 (ref 7.350–7.450)
pO2, Arterial: 150 mmHg — ABNORMAL HIGH (ref 80.0–100.0)

## 2015-07-02 LAB — CBC
HEMATOCRIT: 22.5 % — AB (ref 36.0–46.0)
HEMOGLOBIN: 7 g/dL — AB (ref 12.0–15.0)
MCH: 29 pg (ref 26.0–34.0)
MCHC: 31.1 g/dL (ref 30.0–36.0)
MCV: 93.4 fL (ref 78.0–100.0)
Platelets: 335 10*3/uL (ref 150–400)
RBC: 2.41 MIL/uL — AB (ref 3.87–5.11)
RDW: 14.5 % (ref 11.5–15.5)
WBC: 13 10*3/uL — AB (ref 4.0–10.5)

## 2015-07-02 LAB — MAGNESIUM: Magnesium: 1.9 mg/dL (ref 1.7–2.4)

## 2015-07-02 LAB — GLUCOSE, CAPILLARY
GLUCOSE-CAPILLARY: 153 mg/dL — AB (ref 65–99)
GLUCOSE-CAPILLARY: 119 mg/dL — AB (ref 65–99)
GLUCOSE-CAPILLARY: 137 mg/dL — AB (ref 65–99)
GLUCOSE-CAPILLARY: 137 mg/dL — AB (ref 65–99)
Glucose-Capillary: 122 mg/dL — ABNORMAL HIGH (ref 65–99)

## 2015-07-02 LAB — PHOSPHORUS: Phosphorus: 2.4 mg/dL — ABNORMAL LOW (ref 2.5–4.6)

## 2015-07-02 LAB — SODIUM
Sodium: 150 mmol/L — ABNORMAL HIGH (ref 135–145)
Sodium: 157 mmol/L — ABNORMAL HIGH (ref 135–145)
Sodium: 143 mmol/L (ref 135–145)

## 2015-07-02 MED ORDER — POTASSIUM CHLORIDE 10 MEQ/50ML IV SOLN
10.0000 meq | INTRAVENOUS | Status: AC
  Administered 2015-07-02 (×2): 10 meq via INTRAVENOUS
  Filled 2015-07-02: qty 50

## 2015-07-02 MED ORDER — POTASSIUM CHLORIDE 10 MEQ/50ML IV SOLN
INTRAVENOUS | Status: AC
  Administered 2015-07-02: 10 meq via INTRAVENOUS
  Filled 2015-07-02: qty 50

## 2015-07-02 NOTE — Progress Notes (Signed)
SLP Cancellation Note  Patient Details Name: Becky Patterson MRN: [id number] DOB: [birth year]   Cancelled treatment:       Reason Eval/Treat Not Completed: Patient not medically ready. Will sign off at this time, please reorder when ready.    Orell Hurtado, Katherene Ponto 07/02/2015, 7:27 AM

## 2015-07-02 NOTE — Progress Notes (Signed)
ETT was found to be 19.5 @ lip. RT advanced back to 22 @ the lip. RT will continue to monitor.

## 2015-07-02 NOTE — Progress Notes (Signed)
Referring Physician(s): Cytogeneticist Complaint:  CVA Rt ICA/MCA revascularization 2/5  Subjective:  On vent More alert Follows some commands  Allergies: Codeine  Medications: Prior to Admission medications   Medication Sig Start Date End Date Taking? Authorizing Provider  acetaminophen (TYLENOL) 500 MG tablet Take 1,000 mg by mouth 2 (two) times daily as needed for mild pain or headache.   Yes Historical Provider, MD  ciprofloxacin (CIPRO) 750 MG tablet Take 1 tablet (750 mg total) by mouth 2 (two) times daily. 06/26/15  Yes Nita Sells, MD  ferrous sulfate 325 (65 FE) MG tablet Take 1 tablet (325 mg total) by mouth 2 (two) times daily with a meal. 06/26/15  Yes Nita Sells, MD  furosemide (LASIX) 40 MG tablet Take 1 tablet (40 mg total) by mouth daily. 06/26/15  Yes Nita Sells, MD  Hydrocodone-Acetaminophen 5-300 MG TABS Take 1 tablet by mouth every 6 (six) hours as needed (for pain). Reported on 06/15/2015 06/08/15  Yes Historical Provider, MD  naproxen sodium (ANAPROX) 220 MG tablet Take 440 mg by mouth 2 (two) times daily as needed (for pain).   Yes Historical Provider, MD  promethazine (PHENERGAN) 6.25 MG/5ML syrup Take 10 mLs (12.5 mg total) by mouth every 6 (six) hours as needed for nausea or vomiting. 06/26/15  Yes Nita Sells, MD  traMADol (ULTRAM) 50 MG tablet Take 1-2 tablets (50-100 mg total) by mouth every 6 (six) hours as needed for moderate pain. 06/26/15  Yes Nita Sells, MD     Vital Signs: BP 131/77 mmHg  Pulse 82  Temp(Src) 99.2 F (37.3 C) (Axillary)  Resp 17  Ht '5\' 2"'$  (1.575 m)  Wt 205 lb 11 oz (93.3 kg)  BMI 37.61 kg/m2  SpO2 100%  Physical Exam  Pulmonary/Chest:  vent  Musculoskeletal:  Moves Rt side to command Grip is good Moves Rt leg well  No movement on left for me  Neurological:  Can stick tongue out Can follow me with eyes Can look to left and right to command  Skin: Skin is warm.  Nursing note and  vitals reviewed.   Imaging: Ct Angio Head W/cm &/or Wo Cm  06/29/2015  CLINICAL DATA:  60 year 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 Initial encounter. EXAM: CT ANGIOGRAPHY HEAD AND NECK TECHNIQUE: Multidetector CT imaging of the head and neck was performed using the standard protocol during bolus administration of intravenous contrast. Multiplanar CT image reconstructions and MIPs were obtained to evaluate the vascular anatomy. Carotid stenosis measurements (when applicable) are obtained utilizing NASCET criteria, using the distal internal carotid diameter as the denominator. CONTRAST:  50 mL Omnipaque 350 COMPARISON:  Head CT without contrast 1001 hours today. FINDINGS: CTA NECK Skeleton: Intermittent dental periapical lucency. No acute osseous abnormality identified. Visualized paranasal sinuses and mastoids are clear. Other neck: Negative lung apices other than dependent atelectasis. No superior mediastinal lymphadenopathy. Thyroid, larynx, pharynx, parapharyngeal spaces, retropharyngeal space, sublingual space, submandibular glands, and parotid glands are within normal limits. No cervical lymphadenopathy. Aortic arch: 3 vessel arch configuration. Minimal distal arch calcified plaque. No great vessel origin stenosis. Right carotid system: Negative right CCA and right carotid bifurcation. Moderately tortuous proximal right ICA with a mildly kinked appearance just past the bulb. Otherwise negative cervical right ICA. Left carotid system: Negative left CCA. Soft plaque in the left ICA origin and bulb less than 50 % stenosis with respect to the distal vessel at the distal bulb. Tortuous cervical left ICA otherwise. Vertebral arteries:No  proximal right subclavian artery stenosis. Normal right vertebral artery origin. Mildly dominant right vertebral artery. Soft plaque in the proximal left subclavian artery just proximal to the left vertebral artery origin with less than 50 %  stenosis with respect to the distal vessel. The left vertebral artery origin appears normal. Mildly non dominant left vertebral artery appears normal to the skullbase. CTA HEAD Posterior circulation: Mildly dominant distal right vertebral artery. Normal PICA origins. No distal vertebral stenosis. Patent vertebrobasilar junction. Patent basilar artery without stenosis. SCA and left PCA origins are normal, fetal type right PCA origin. PCA branches are within normal limits. Anterior circulation: Both ICA siphons are patent without stenosis. Minimal calcified plaque in the right cavernous segment. Normal right posterior communicating artery origin. The left ICA terminus is normal. Filling defect at the right ICA terminus, MCA and ACA origin best seen on series 505, image 17. 7-8 mm segment of the right MCA M1 segment is nonenhancing, but with collateral enhancement at the right MCA bifurcation and sylvian division. The right ACA A1 segment is non dominant. It remains patent likely supplied from the left. Normal left MCA and ACA origins. Dominant left A1. Anterior communicating artery and bilateral ACA branches are within normal limits. Left MCA M1 segment, bifurcation, and left MCA branches are within normal limits. Venous sinuses: Patent. Anatomic variants: Dominant left ACA A1 segment. Fetal type right PCA origin. Mildly dominant right vertebral artery. Right hemisphere gray-white matter differentiation appears stableand normal compared to 1001 hours today. IMPRESSION: 1. Positive for emergent large vessel occlusion: Occluded right ICA terminus, right MCA and right ACA origins. 7-8 mm occluded segment of the right M1 segment but good right MCA M2 and distal collaterals. Also, the left A1 is dominant such that bilateral A2 and distal ACA flow appears symmetric. Finding of ELVO at the [facility] origin reviewed in person with Dr. Roland Rack on 06/29/2015 at 1105 hours. 2. Mildly to moderately tortuous cervical ICAs.  Mild plaque at the right ICA origin and bulb without stenosis. Minimal calcified right ICA siphon plaque. Moderate plaque at the distal left ICA bulb but without hemodynamically significant stenosis. 3. Proximal left subclavian artery soft plaque without significant stenosis. Negative posterior circulation. 4. Grossly stable CT appearance of the brain since 1001 hours. Electronically Signed   By: Genevie Ann M.D.   On: 06/29/2015 11:28   Ct Head Wo Contrast  07/02/2015  CLINICAL DATA:  Follow-up examination for stroke. EXAM: CT HEAD WITHOUT CONTRAST TECHNIQUE: Contiguous axial images were obtained from the base of the skull through the vertex without intravenous contrast. COMPARISON:  Prior CT from 06/29/2014 as well as prior MRI from 06/30/2014. FINDINGS: Extensive cytotoxic edema seen throughout the right MCA territory, consistent with large acute right MCA territory infarct. Again, there is involvement of the right frontal, right parietal, and right temporal lobes, with involvement of the basal ganglia. Overall, distribution is stable from recent MRI. No evidence for hemorrhagic transformation on this examination. Persistent hyperdensity within the right M1 segment and right MCA at the sylvian fissure likely representing thrombosis. There is localized edema with partial effacement of the right lateral ventricle, similar to prior MRI. Mild bowing of the septum pellucidum from right-to-left measures up to 4 mm. Right uncal herniation, similar to previous. Basilar cisterns minimally crowded at the level of the right uncus, but otherwise patent. No hydrocephalus or evidence for ventricular trapping. Cytotoxic edema within the anterior insular/subinsular region on the left also similar relative to previous MRI. No significant mass  effect or evidence for hemorrhagic transformation. No new infarct.  No extra-axial fluid collection. No acute abnormality about the orbits. Scalp soft tissues within normal limits. Mild  mucosal thickening within the ethmoidal air cells, maxillary sinuses, and sphenoid sinuses. Paranasal sinuses are otherwise clear. Trace opacity within the right mastoid air cells. Calvarium intact. IMPRESSION: 1. Continued interval evolution of large ischemic right MCA territory infarct, overall stable in size and distribution relative to recent MRI. No evidence for hemorrhagic transformation. Localized mass effect with effacement of the right lateral ventricle, right uncal herniation, and 4 mm of right-to-left shift present. No hydrocephalus or evidence for ventricular trapping. 2. Stable size of smaller left anterior insular/subinsular ischemic infarct. No evidence for associated hemorrhage or mass effect. 3. No other acute intracranial process. Electronically Signed   By: Jeannine Boga M.D.   On: 07/02/2015 04:35   Ct Head Wo Contrast  06/30/2015  CLINICAL DATA:  Status post revascularization of the right middle cerebral artery. Left-sided paraplegia. EXAM: CT HEAD WITHOUT CONTRAST TECHNIQUE: Contiguous axial images were obtained from the base of the skull through the vertex without intravenous contrast. COMPARISON:  CT head without contrast 06/29/2015. FINDINGS: A developing large right MCA territory infarct is noted. This involves the posterior any inferior division. A hyperdense right MCA and posterior MCA branch vessel is noted. This is compatible with reocclusion. The right lentiform nucleus is infarcted. There is asymmetry of the right caudate head. No focal hemorrhage is present. 3 mm of midline shift is now present. There is partial effacement of the right lateral ventricle. The basal cisterns are intact. No focal left-sided infarct is present. There is no significant extra-axial fluid collection. IMPRESSION: 1. 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 2. Sparing of the superior right MCA division. This may be secondary to collaterals of the right anterior cerebral artery. 3.  Nonhemorrhagic infarction within the basal ganglia. These results were called by telephone at the time of interpretation on 06/30/2015 at 3:38 pm to Dr. Antony Contras , who verbally acknowledged these results. Electronically Signed   By: San Morelle M.D.   On: 06/30/2015 15:38   Ct Head Wo Contrast  06/29/2015  CLINICAL DATA:  60 year old female status post neurointervention, clot retrieval for emergent right ICA terminus occlusion in the setting of infectious endocarditis. Initial encounter. EXAM: CT HEAD WITHOUT CONTRAST TECHNIQUE: Contiguous axial images were obtained from the base of the skull through the vertex without intravenous contrast. COMPARISON:  Cta head and neck 1039 hours today and earlier. FINDINGS: Fluid in the pharynx. Mild paranasal sinus mucosal thickening. Stable visualized osseous structures. Stable orbit and scalp soft tissues. Residual intravascular contrast. Mild contrast staining along the course of the right MCA posterior division in the right temporal lobe and posterior insula. No CT changes of acute cortically based infarct are identified. No acute intracranial hemorrhage identified. No midline shift, mass effect, or evidence of intracranial mass lesion. No ventriculomegaly. Stable basilar cisterns. IMPRESSION: No adverse features status post Neurointervention for emergent right ICA terminus occlusion. No acute or developing right hemisphere infarct identified. Electronically Signed   By: Genevie Ann M.D.   On: 06/29/2015 15:55   Ct Head Wo Contrast  06/29/2015  CLINICAL DATA:  Code stroke.  Left-sided weakness and slurred speech EXAM: CT HEAD WITHOUT CONTRAST TECHNIQUE: Contiguous axial images were obtained from the base of the skull through the vertex without intravenous contrast. COMPARISON:  CT head 06/17/2015 FINDINGS: Ventricle size normal. Cerebral volume normal for age. Mild hypodensity in  the periventricular white matter consistent with mild chronic microvascular ischemia.  Negative for acute infarct.  Negative for acute hemorrhage or mass. Calvarium intact. IMPRESSION: Mild chronic microvascular ischemia.  No acute abnormality Critical Value/emergent results were called by telephone at the time of interpretation on 06/29/2015 at 10:13 am to Dr. Leonel Ramsay , who verbally acknowledged these results. Electronically Signed   By: Franchot Gallo M.D.   On: 06/29/2015 10:13   Ct Angio Neck W/cm &/or Wo/cm  06/29/2015  CLINICAL DATA:  60 year 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 Initial encounter. EXAM: CT ANGIOGRAPHY HEAD AND NECK TECHNIQUE: Multidetector CT imaging of the head and neck was performed using the standard protocol during bolus administration of intravenous contrast. Multiplanar CT image reconstructions and MIPs were obtained to evaluate the vascular anatomy. Carotid stenosis measurements (when applicable) are obtained utilizing NASCET criteria, using the distal internal carotid diameter as the denominator. CONTRAST:  50 mL Omnipaque 350 COMPARISON:  Head CT without contrast 1001 hours today. FINDINGS: CTA NECK Skeleton: Intermittent dental periapical lucency. No acute osseous abnormality identified. Visualized paranasal sinuses and mastoids are clear. Other neck: Negative lung apices other than dependent atelectasis. No superior mediastinal lymphadenopathy. Thyroid, larynx, pharynx, parapharyngeal spaces, retropharyngeal space, sublingual space, submandibular glands, and parotid glands are within normal limits. No cervical lymphadenopathy. Aortic arch: 3 vessel arch configuration. Minimal distal arch calcified plaque. No great vessel origin stenosis. Right carotid system: Negative right CCA and right carotid bifurcation. Moderately tortuous proximal right ICA with a mildly kinked appearance just past the bulb. Otherwise negative cervical right ICA. Left carotid system: Negative left CCA. Soft plaque in the left ICA origin and bulb less than  50 % stenosis with respect to the distal vessel at the distal bulb. Tortuous cervical left ICA otherwise. Vertebral arteries:No proximal right subclavian artery stenosis. Normal right vertebral artery origin. Mildly dominant right vertebral artery. Soft plaque in the proximal left subclavian artery just proximal to the left vertebral artery origin with less than 50 % stenosis with respect to the distal vessel. The left vertebral artery origin appears normal. Mildly non dominant left vertebral artery appears normal to the skullbase. CTA HEAD Posterior circulation: Mildly dominant distal right vertebral artery. Normal PICA origins. No distal vertebral stenosis. Patent vertebrobasilar junction. Patent basilar artery without stenosis. SCA and left PCA origins are normal, fetal type right PCA origin. PCA branches are within normal limits. Anterior circulation: Both ICA siphons are patent without stenosis. Minimal calcified plaque in the right cavernous segment. Normal right posterior communicating artery origin. The left ICA terminus is normal. Filling defect at the right ICA terminus, MCA and ACA origin best seen on series 505, image 17. 7-8 mm segment of the right MCA M1 segment is nonenhancing, but with collateral enhancement at the right MCA bifurcation and sylvian division. The right ACA A1 segment is non dominant. It remains patent likely supplied from the left. Normal left MCA and ACA origins. Dominant left A1. Anterior communicating artery and bilateral ACA branches are within normal limits. Left MCA M1 segment, bifurcation, and left MCA branches are within normal limits. Venous sinuses: Patent. Anatomic variants: Dominant left ACA A1 segment. Fetal type right PCA origin. Mildly dominant right vertebral artery. Right hemisphere gray-white matter differentiation appears stableand normal compared to 1001 hours today. IMPRESSION: 1. Positive for emergent large vessel occlusion: Occluded right ICA terminus, right MCA  and right ACA origins. 7-8 mm occluded segment of the right M1 segment but good right MCA M2 and  distal collaterals. Also, the left A1 is dominant such that bilateral A2 and distal ACA flow appears symmetric. Finding of ELVO at the [facility] origin reviewed in person with Dr. Roland Rack on 06/29/2015 at 1105 hours. 2. Mildly to moderately tortuous cervical ICAs. Mild plaque at the right ICA origin and bulb without stenosis. Minimal calcified right ICA siphon plaque. Moderate plaque at the distal left ICA bulb but without hemodynamically significant stenosis. 3. Proximal left subclavian artery soft plaque without significant stenosis. Negative posterior circulation. 4. Grossly stable CT appearance of the brain since 1001 hours. Electronically Signed   By: Genevie Ann M.D.   On: 06/29/2015 11:28   Mr Brain Wo Contrast  07/01/2015  CLINICAL DATA:  60 year old female with infective endocarditis on treatment with sudden onset of left-sided weakness. Post angiogram and endovascular treatment for right internal carotid artery terminus occlusion. Subsequent encounter. EXAM: MRI HEAD WITHOUT CONTRAST TECHNIQUE: Multiplanar, multiecho pulse sequences of the brain and surrounding structures were obtained without intravenous contrast. COMPARISON:  06/30/2015 head CT.  No comparison brain MR. FINDINGS: Exam is motion degraded. Large acute right middle cerebral artery distribution infarct involving portions the right temporal lobe, right frontal lobe, right parietal lobe, operculum region, right basal ganglia and right corona radiata with local mass effect upon the right lateral ventricle with slight bowing of the septum to the left by 2 mm. On gradient sequence no associated blood however, slight T1 hyperintensity within the right lenticular nucleus and subtle blood at this level therefore not excluded. Mild right uncal herniation Nonhemorrhagic acute anterior left subinsular/peri operculum infarct. Mild small vessel disease  changes. No intracranial mass lesion noted on this unenhanced exam. Cerebellar tonsils slightly low lying but within the range of normal limits. Decreased signal intensity of bone marrow may be related to patient's habitus or anemia. At the level of the sella, major intracranial vascular structures are patent. Limited evaluation right middle cerebral artery branches. IMPRESSION: Large acute right middle cerebral artery distribution infarct involving portions the right temporal lobe, right frontal lobe, right parietal lobe, operculum region, right basal ganglia and right corona radiata with local mass effect upon the right lateral ventricle with slight bowing of the septum to the left by 2 mm. On gradient sequence no associated blood however, slight T1 hyperintensity within the right lenticular nucleus and subtle blood at this level therefore not excluded. Mild right uncal herniation. Nonhemorrhagic acute anterior left subinsular/peri operculum infarct. Electronically Signed   By: Genia Del M.D.   On: 07/01/2015 16:34   Dg Chest Port 1 View  07/02/2015  CLINICAL DATA:  Endotracheal tube EXAM: PORTABLE CHEST 1 VIEW COMPARISON:  None. FINDINGS: Endotracheal 9 cm from carina. Consider advancement by 2-3 cm. Stable enlarged cardiac silhouette. There is central venous congestion similar prior. No pneumothorax. IMPRESSION: Endotracheal tube 9 cm from carina.  Consider advancement by 2-3 cm. Central venous congestion Electronically Signed   By: Suzy Bouchard M.D.   On: 07/02/2015 07:43   Dg Chest Port 1 View  07/01/2015  CLINICAL DATA:  Check endotracheal tube placement EXAM: PORTABLE CHEST 1 VIEW COMPARISON:  06/30/2015 FINDINGS: Cardiac shadow is stable. An endotracheal tube is noted at least 10 cm above the carina. A nasogastric catheter is seen in the stomach. A right-sided PICC line is noted in the distal superior vena cava. No focal infiltrate or sizable effusion is seen. Previously seen right upper lobe  infiltrate has resolved. No bony abnormality is noted. Old left rib fractures are seen. IMPRESSION:  Endotracheal to several cm above the carina as described. This could be advanced of 4-5 cm. Continued clearing of right upper lobe infiltrate. Electronically Signed   By: Inez Catalina M.D.   On: 07/01/2015 07:45   Dg Chest Port 1 View  06/30/2015  CLINICAL DATA:  Intubation. EXAM: PORTABLE CHEST 1 VIEW COMPARISON:  06/29/2015. FINDINGS: Endotracheal tube tip is at the thoracic inlet, advancement of approximately 3 -4 cm should be considered. NG tube noted with tip below left hemidiaphragm. Interim partial clearing of right upper lobe atelectasis. Cardiomegaly with mild pulmonary venous congestion and interstitial prominence. No pleural effusion or pneumothorax . IMPRESSION: 1. Endotracheal tube tip at the thoracic inlet, interim advancement of approximately 3-4 cm should be considered. NG tube in stable position. The 2. Interim partial clearing of right upper lobe atelectasis. 3. Cardiomegaly with mild pulmonary interstitial prominence. A mild component of congestive heart failure cannot be excluded. Electronically Signed   By: Marcello Moores  Register   On: 06/30/2015 07:59   Dg Chest Port 1 View  06/29/2015  CLINICAL DATA:  Endotracheal tube placement EXAM: PORTABLE CHEST 1 VIEW COMPARISON:  06/15/2015 FINDINGS: Endotracheal tube is approximately 6 cm above the carina and could be advanced 2 cm. NG tube enters stomach Right upper lobe collapse has developed since the prior study. Left lower lobe nodule better seen on prior studies. Negative for heart failure or effusion. IMPRESSION: Endotracheal tube is 6 cm above the carina, recommend advancing 2 cm Right upper lobe collapse Electronically Signed   By: Franchot Gallo M.D.   On: 06/29/2015 17:55    Labs:  CBC:  Recent Labs  06/29/15 1014  06/30/15 0002 06/30/15 0430 07/01/15 0548 07/02/15 0533  WBC 15.0*  --   --  14.5* 14.8* 13.0*  HGB 8.7*  9.5*  < >  7.7* 8.1* 7.9* 7.0*  HCT 25.9*  28.0*  < > 22.7* 24.0* 24.4* 22.5*  PLT 390  --   --  366 358 335  < > = values in this interval not displayed.  COAGS:  Recent Labs  06/16/15 0920 06/17/15 0527 06/29/15 1014 06/30/15 0002  INR 1.52* 1.45 1.41 1.46  APTT 34  --  38* 32    BMP:  Recent Labs  06/29/15 1014 06/29/15 1438 06/30/15 0430  07/01/15 0548  07/01/15 1748 07/01/15 1845 07/02/15 0028 07/02/15 0533  NA 130*  132* 137 135  < > 137  < > 150* 143 150* 144  K 3.9  3.8 3.2* 3.7  --  3.6  --   --   --   --  3.4*  CL 94*  96*  --  104  --  105  --   --   --   --  112*  CO2 24  --  21*  --  21*  --   --   --   --  25  GLUCOSE 182*  176* 172* 148*  --  168*  --   --   --   --  149*  BUN 8  8  --  <5*  --  7  --   --   --   --  12  CALCIUM 8.4*  --  7.5*  --  8.3*  --   --   --   --  8.3*  CREATININE 1.04*  0.90  --  0.85  --  0.77  --   --   --   --  0.75  GFRNONAA 57*  --  >  60  --  >60  --   --   --   --  >60  GFRAA >60  --  >60  --  >60  --   --   --   --  >60  < > = values in this interval not displayed.  LIVER FUNCTION TESTS:  Recent Labs  06/15/15 1520 06/17/15 0527 06/29/15 1014  BILITOT 0.6 0.3 0.4  AST '30 20 20  '$ ALT '30 27 16  '$ ALKPHOS 115 79 76  PROT 6.9 5.4* 6.4*  ALBUMIN 2.4* 1.9* 2.2*    Assessment and Plan:  CVA IR intervention 2/5 R ICA/MCA revascularization  Electronically Signed: Ura Yingling A 07/02/2015, 8:27 AM   I spent a total of 15 Minutes at the the patient's bedside AND on the patient's hospital floor or unit, greater than 50% of which was counseling/coordinating care for CVA; Rt ICA/MCA intervention

## 2015-07-02 NOTE — Progress Notes (Signed)
   07/02/15 1355  Clinical Encounter Type  Visited With Patient and family together;Health care provider  Visit Type Follow-up;Spiritual support;Critical Care  Referral From Nurse  Spiritual Encounters  Spiritual Needs Prayer   Chaplain met with patient's family (niece). Chaplain offered support and prayer, and our services are available as needed.   Jeri Lager, Chaplain 07/02/2015 1:57 PM

## 2015-07-02 NOTE — Progress Notes (Signed)
Epic vent flowsheet tab is down, charge RT/Epic support is aware.  Pt seen at last rounds at [facility].  HR86, bp 120/67, spo2 99%.  Current vent settings: PRVC 410/15/40%/+5.  Pt does meet wean criteria.  Alarms set and operable.  RT will continue to monitor and assess pt.

## 2015-07-02 NOTE — Progress Notes (Signed)
PT Cancellation Note  Patient Details Name: Becky Patterson MRN: [id number] DOB: 08/24/1955   Cancelled Treatment:    Reason Eval/Treat Not Completed: Patient not medically ready.  Noted pt extubated today and MD note indicating pt is not protecting her airway.  MD note states pt is now DNR and with possible comfort care pending pt respiratory status.  Will hold PT and mobility at this time.     Catarina Hartshorn, [place] 07/02/2015, 1:21 PM

## 2015-07-02 NOTE — Progress Notes (Signed)
Late entry for 06/29/15 '@1530'$  IR tech called pharmacy to prepare Dr. Threasa Heads IR heparin saline mixture. RN was asked to go collect bags. RN arrived at pharmacy window. Pharmacy tech handed RN 5 fluid bags and informed RN that last bag was being mixed by pharmacist and if RN would like to wait. RN waited. Collected 6 bags of fluid and returned to IR. One of the incorrect bags was taken, hung and used as a flush bag per IR tech France Ravens. RN not aware of exact amount used as flush. At the end of the procedure when RN went to call pt's family in to speak with MD and noticed pharmacy tech was on his way to the IR nurses station inform staff of findings. That the incorrect bags were given to IR, and only the last bag mixed was accurate. The bags contained 35000 units of heparin in 1031m of NS. The correct mixture is suppose to be 5000 units of heparin in 1000 ml of NS. RN informed tech's of findings and Dr. DDuke Salviawas informed. An Activated Clotting Time was done resulting in168. Pt does not show any s/s of active bleeding and no bleeding noted. Incision site is c/d/i.

## 2015-07-02 NOTE — Progress Notes (Addendum)
[facility] Progress Note Patient Name: Becky Patterson DOB: 24-Oct-1955 MRN: [id number]   Date of Service  07/02/2015  HPI/Events of Note  Delirium/Agitation - patient pulled out NGT. Request for bilateral wrist restraints.  eICU Interventions  Will order bilateral wrist restraints.      Intervention Category Major Interventions: Delirium, psychosis, severe agitation - evaluation and management  Zianna Dercole [place] 07/02/2015, 5:20 AM

## 2015-07-02 NOTE — Consult Note (Signed)
Consultation Note Date: 07/02/2015   Patient Name: Becky Patterson  DOB: [birth year]  MRN: [id number]  Age / Sex: 60 y.o., female  PCP: No primary care provider on file. Referring Physician: Garvin Fila, MD  Reason for Consultation: Establishing goals of care, Non pain symptom management, Pain control and Psychosocial/spiritual support   Clinical Assessment/Narrative:  Ct Head Wo Contrast 07/02/2015 1. Continued interval evolution of large ischemic right MCA territory infarct, overall stable in size and distribution Relative to recent MRI. No evidence for hemorrhagic transformation. Localized mass effect with effacement of the right lateral ventricle, right uncal herniation, and 4 mm of right-to-left shift present. No hydrocephalus or evidence for ventricular trapping. 2. Stable size of smaller left anterior insular/subinsular ischemic infarct. No evidence for associated hemorrhage or mass effect. 3. No other acute intracranial process. 06/29/2105 1. 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 2. Sparing of the superior right MCA division. This may be secondary to collaterals of the right anterior cerebral artery. 3. Nonhemorrhagic infarction within the basal ganglia. 06/29/2015 No adverse features status post Neurointervention for emergent right ICA terminus occlusion. No acute or developing right hemisphere infarct identified.  06/29/2015 Mild chronic microvascular ischemia. No acute abnormality      [name] is a 60 y.o. female history of infective endocarditis that was treated with discharge on 2/2, continuing on antibiotics at home. She was being treated with fluoroquinolone. She was doing well until today when she had sudden onset of left-sided weakness that started around 6:30 AM. She woke up normal, with clear speech and no weakness and then developed slurred speech and hand weakness. She has  progressively declined since that time and is currently having significant left-sided weakness as well as neglect. She was brought into the emergency room as bilateral arm weakness, but once here was recognized that it was left arm weakness and a code stroke was activated. She went for a CT, and once was recognized that she had infective endocarditis TPA was considered contraindicated. She was taken back for a CT angiogram which did show an M1 occlusion with some collateralization.  Family is faced with advanced directive decisions and anticipatory care needs.  This NP Wadie Lessen reviewed medical records, received report from team, assessed the patient and then meet at the patient's bedside along with her husband and niece Cecille Rubin  to discuss diagnosis, prognosis, GOC, and options.  A  discussion was had today regarding advanced directives.  Concepts specific to code status, artifical feeding and hydration, continued IV antibiotics and rehospitalization was had.  The difference between a aggressive medical intervention path  and a palliative comfort care path for this patient at this time was had.  Values and goals of care important to patient and family were attempted to be elicited.  Concept of Hospice and Palliative Care were discussed  Questions and concerns addressed. Family encouraged to call with questions or concerns.  PMT will continue to support holistically.   Contacts/Participants in Discussion: Primary Decision Maker: Husband, he will include patient's two children  HCPOA: none documented    SUMMARY OF RECOMMENDATIONS  -continue current medical interventions until further discussion tomorrow with her two children at 1000 -conversation will be focused on long term goals of care considering all medical co-morbidities, not specifically focusing on recent CVA  Code Status/Advance Care Planning: DNR    Code Status Orders        Start     Ordered   07/02/15 1252  Do not attempt  resuscitation (DNR)   Continuous    Question Answer Comment  In the event of cardiac or respiratory ARREST Do not call a "code blue"   In the event of cardiac or respiratory ARREST Do not perform Intubation, CPR, defibrillation or ACLS   In the event of cardiac or respiratory ARREST Use medication by any route, position, wound care, and other measures to relive pain and suffering. May use oxygen, suction and manual treatment of airway obstruction as needed for comfort.      07/02/15 1251    Code Status History    Date Active Date Inactive Code Status Order ID Comments User Context   06/29/2015  4:06 PM 07/02/2015 12:51 PM Full Code [id number]  Greta Doom, MD Inpatient   06/15/2015  9:48 PM 06/26/2015  3:29 PM Full Code [id number]  Edwin Dada, MD Inpatient      Other Directives:None  Symptom Management:   Dysphagia: speech therapy to evaluation and make recommendations  Palliative Prophylaxis:   Aspiration, Bowel Regimen, Delirium Protocol, Eye Care, Frequent Pain Assessment and Turn Reposition, oral care  Additional Recommendations (Limitations, Scope, Preferences):  Husband wishes to continue with current medical interventions until tomorrow morning when her children will gather to further define [place], we discussed what a full comfort path would look like and the possibility of  a hospice facility  Psycho-social/Spiritual:  Support System: Strong Desire for further Chaplaincy support:no Additional Recommendations: Education on Hospice and Grief/Bereavement Support  Prognosis:  Pending decisions regarding life prolonging intervetnions Discharge Planning: Pending   Chief Complaint/ Primary Diagnoses: Present on Admission:  . Stroke (cerebrum) ([place])  I have reviewed the medical record, interviewed the patient and family, and examined the patient. The following aspects are pertinent.  History reviewed. No pertinent past medical history. Social History   Social  History  . Marital Status: Married    Spouse Name: N/A  . Number of Children: N/A  . Years of Education: N/A   Social History Main Topics  . Smoking status: Current Every Day Smoker  . Smokeless tobacco: None  . Alcohol Use: No  . Drug Use: No  . Sexual Activity: Not Asked   Other Topics Concern  . None   Social History Narrative   Family History  Problem Relation Age of Onset  . Lung cancer Mother   . Liver cancer Brother   . Heart attack Brother    Scheduled Meds: . antiseptic oral rinse  7 mL Mouth Rinse 10 times per day  . chlorhexidine gluconate  15 mL Mouth Rinse BID  . feeding supplement (VITAL HIGH PROTEIN)  1,000 mL Per Tube Q24H  . imipenem-cilastatin  500 mg Intravenous Q6H  . insulin aspart  0-15 Units Subcutaneous 6 times per day  . multivitamin with minerals  1 tablet Per Tube Daily  . pantoprazole (PROTONIX) IV  40 mg Intravenous Q24H  . potassium chloride  10 mEq Intravenous Q1 Hr x 2  . sodium chloride flush  10-40 mL Intracatheter Q12H   Continuous Infusions: . phenylephrine (NEO-SYNEPHRINE) Adult infusion 90 mcg/min (07/02/15 0900)  . sodium chloride (hypertonic) 75 mL/hr (07/02/15 0904)   PRN Meds:.acetaminophen **OR** acetaminophen, diphenhydrAMINE, fentaNYL (SUBLIMAZE) injection, midazolam, ondansetron (ZOFRAN) IV, sodium chloride flush Medications Prior to Admission:  Prior to Admission medications   Medication Sig Start Date End Date Taking? Authorizing Provider  acetaminophen (TYLENOL) 500 MG tablet Take 1,000 mg by mouth 2 (two) times daily as needed for mild pain or headache.   Yes  Historical Provider, MD  ciprofloxacin (CIPRO) 750 MG tablet Take 1 tablet (750 mg total) by mouth 2 (two) times daily. 06/26/15  Yes Nita Sells, MD  ferrous sulfate 325 (65 FE) MG tablet Take 1 tablet (325 mg total) by mouth 2 (two) times daily with a meal. 06/26/15  Yes Nita Sells, MD  furosemide (LASIX) 40 MG tablet Take 1 tablet (40 mg total) by  mouth daily. 06/26/15  Yes Nita Sells, MD  Hydrocodone-Acetaminophen 5-300 MG TABS Take 1 tablet by mouth every 6 (six) hours as needed (for pain). Reported on 06/15/2015 06/08/15  Yes Historical Provider, MD  naproxen sodium (ANAPROX) 220 MG tablet Take 440 mg by mouth 2 (two) times daily as needed (for pain).   Yes Historical Provider, MD  promethazine (PHENERGAN) 6.25 MG/5ML syrup Take 10 mLs (12.5 mg total) by mouth every 6 (six) hours as needed for nausea or vomiting. 06/26/15  Yes Nita Sells, MD  traMADol (ULTRAM) 50 MG tablet Take 1-2 tablets (50-100 mg total) by mouth every 6 (six) hours as needed for moderate pain. 06/26/15  Yes Nita Sells, MD   Allergies  Allergen Reactions  . Codeine Nausea And Vomiting    Review of Systems  Unable to perform ROS   Physical Exam  Constitutional: She appears well-developed.  HENT:  Head: Normocephalic.  Cardiovascular: Normal rate and regular rhythm.   Respiratory: Effort normal and breath sounds normal.  Skin: Skin is warm and dry.    Vital Signs: BP 121/72 mmHg  Pulse 87  Temp(Src) 98.2 F (36.8 C) (Axillary)  Resp 19  Ht '5\' 2"'$  (1.575 m)  Wt 93.3 kg (205 lb 11 oz)  BMI 37.61 kg/m2  SpO2 96%  SpO2: SpO2: 96 % O2 Device:SpO2: 96 % O2 Flow Rate: .O2 Flow Rate (L/min): 4 L/min  IO: Intake/output summary:  Intake/Output Summary (Last 24 hours) at 07/02/15 1408 Last data filed at 07/02/15 1300  Gross per 24 hour  Intake 3497.73 ml  Output   3240 ml  Net 257.73 ml    LBM: Last BM Date: 06/30/15 Baseline Weight: Weight: 87.2 kg (192 lb 3.9 oz) Most recent weight: Weight: 93.3 kg (205 lb 11 oz)      Palliative Assessment/Data:    Additional Data Reviewed:  CBC:    Component Value Date/Time   WBC 13.0* 07/02/2015 0533   HGB 7.0* 07/02/2015 0533   HCT 22.5* 07/02/2015 0533   PLT 335 07/02/2015 0533   MCV 93.4 07/02/2015 0533   NEUTROABS 11.3* 06/30/2015 0430   LYMPHSABS 1.9 06/30/2015 0430    MONOABS 1.0 06/30/2015 0430   EOSABS 0.3 06/30/2015 0430   BASOSABS 0.0 06/30/2015 0430   Comprehensive Metabolic Panel:    Component Value Date/Time   NA 144 07/02/2015 0533   K 3.4* 07/02/2015 0533   CL 112* 07/02/2015 0533   CO2 25 07/02/2015 0533   BUN 12 07/02/2015 0533   CREATININE 0.75 07/02/2015 0533   GLUCOSE 149* 07/02/2015 0533   CALCIUM 8.3* 07/02/2015 0533   AST 20 06/29/2015 1014   ALT 16 06/29/2015 1014   ALKPHOS 76 06/29/2015 1014   BILITOT 0.4 06/29/2015 1014   PROT 6.4* 06/29/2015 1014   ALBUMIN 2.2* 06/29/2015 1014   Discussed with Dr Leonie Man  Time In: 1415 Time Out: 1530 Time Total: 75  minutes  Greater than 50%  of this time was spent counseling and coordinating care related to the above assessment and plan.  Signed by: Wadie Lessen, NP  Kristine Royal  Davis Gourd, NP  07/02/2015, 2:08 PM  Please contact Palliative Medicine Team phone at [phone] for questions and concerns.

## 2015-07-02 NOTE — Progress Notes (Signed)
PULMONARY / CRITICAL CARE MEDICINE   Name: FAYNE MCGUFFEE MRN: [id number] DOB: 08-Mar-1956    ADMISSION DATE:  06/29/2015 CONSULTATION DATE:  2/5  REFERRING MD:  Neuro -Kirpatrick   CHIEF COMPLAINT:  Vent management /CVA   HISTORY OF PRESENT ILLNESS:   Becky Patterson is a 60 y.o. female history of infective endocarditis that was treated with discharge on 2/2, continuing on antibiotics at home. She was being treated with fluoroquinolone. She was doing well until today when she had sudden onset of left-sided weakness that started around 6:30 AM. She woke up normal, with clear speech and no weakness and then developed slurred speech and hand weakness. She has progressively declined since that time and is currently having significant left-sided weakness as well as neglect. She was brought into the emergency room as bilateral arm weakness, but once here was recognized that it was left arm weakness and a code stroke was activated. She went for a CT, and once was recognized that she had infective endocarditis TPA was considered contraindicated. She was taken back for a CT angiogram which did show an M1 occlusion with some collateralization.  She was hypotensive and given a total of 1.5 L to try and assist her blood pressure. Being taken to interventional radiology per Dr. Estanislado Pandy.  SUBJECTIVE: Awake and following simple commands.  VITAL SIGNS: BP 106/59 mmHg  Pulse 85  Temp(Src) 98.2 F (36.8 C) (Axillary)  Resp 12  Ht '5\' 2"'$  (1.575 m)  Wt 93.3 kg (205 lb 11 oz)  BMI 37.61 kg/m2  SpO2 92%  HEMODYNAMICS: CVP:  [6 mmHg-18 mmHg] 16 mmHg  VENTILATOR SETTINGS: Vent Mode:  [-] PSV;CPAP FiO2 (%):  [40 %] 40 % Set Rate:  [15 bmp] 15 bmp Vt Set:  [410 mL] 410 mL PEEP:  [5 cmH20] 5 cmH20 Pressure Support:  [5 cmH20] 5 cmH20 Plateau Pressure:  [12 cmH20-17 cmH20] 12 cmH20  INTAKE / OUTPUT: I/O last 3 completed shifts: In: 5607.1 [I.V.:3704.1; NG/GT:1303; IV Piggyback:600] Out: 4790  [Urine:4790]  PHYSICAL EXAMINATION: General: Chronically ill appearing female, arousable and following simple commands. Neuro:  Lifts head to command. HEENT:  [place]/AT, PERRL, EOM-I and MMM. Cardiovascular:  RRR, NL S1/S2, -M/R/G. Lungs:  CTA bilaterally. Abdomen:  Soft, NT, ND and +BS. Musculoskeletal:  -edema and -tenderness. Skin:  Scattered bruises.  LABS:  BMET  Recent Labs Lab 06/30/15 0430  07/01/15 0548  07/01/15 1845 07/02/15 0028 07/02/15 0533  NA 135  < > 137  < > 143 150* 144  K 3.7  --  3.6  --   --   --  3.4*  CL 104  --  105  --   --   --  112*  CO2 21*  --  21*  --   --   --  25  BUN <5*  --  7  --   --   --  12  CREATININE 0.85  --  0.77  --   --   --  0.75  GLUCOSE 148*  --  168*  --   --   --  149*  < > = values in this interval not displayed. Electrolytes  Recent Labs Lab 06/30/15 0430 07/01/15 0548 07/02/15 0533  CALCIUM 7.5* 8.3* 8.3*  MG 1.6* 2.1 1.9  PHOS 3.2 2.9 2.4*   CBC  Recent Labs Lab 06/30/15 0430 07/01/15 0548 07/02/15 0533  WBC 14.5* 14.8* 13.0*  HGB 8.1* 7.9* 7.0*  HCT 24.0* 24.4* 22.5*  PLT  [place] 06/29/15 1014 06/30/15 0002  APTT 38* 32  INR 1.41 1.46   Sepsis Markers No results for input(s): LATICACIDVEN, PROCALCITON, O2SATVEN in the last 168 hours.  ABG  Recent Labs Lab 06/30/15 0435 07/01/15 0508 07/02/15 0415  PHART 7.306* 7.366 7.422  PCO2ART 41.7 36.7 35.8  PO2ART 95.8 153* 150*   Liver Enzymes  Recent Labs Lab 06/29/15 1014  AST 20  ALT 16  ALKPHOS 76  BILITOT 0.4  ALBUMIN 2.2*   Cardiac Enzymes No results for input(s): TROPONINI, PROBNP in the last 168 hours.  Glucose  Recent Labs Lab 07/01/15 1231 07/01/15 1634 07/01/15 1925 07/01/15 2313 07/02/15 0339 07/02/15 0748  GLUCAP 155* 134* 148* 140* 153* 119*   Imaging Ct Head Wo Contrast  07/02/2015  CLINICAL DATA:  Follow-up examination for stroke. EXAM: CT HEAD WITHOUT CONTRAST TECHNIQUE:  Contiguous axial images were obtained from the base of the skull through the vertex without intravenous contrast. COMPARISON:  Prior CT from 06/29/2014 as well as prior MRI from 06/30/2014. FINDINGS: Extensive cytotoxic edema seen throughout the right MCA territory, consistent with large acute right MCA territory infarct. Again, there is involvement of the right frontal, right parietal, and right temporal lobes, with involvement of the basal ganglia. Overall, distribution is stable from recent MRI. No evidence for hemorrhagic transformation on this examination. Persistent hyperdensity within the right M1 segment and right MCA at the sylvian fissure likely representing thrombosis. There is localized edema with partial effacement of the right lateral ventricle, similar to prior MRI. Mild bowing of the septum pellucidum from right-to-left measures up to 4 mm. Right uncal herniation, similar to previous. Basilar cisterns minimally crowded at the level of the right uncus, but otherwise patent. No hydrocephalus or evidence for ventricular trapping. Cytotoxic edema within the anterior insular/subinsular region on the left also similar relative to previous MRI. No significant mass effect or evidence for hemorrhagic transformation. No new infarct.  No extra-axial fluid collection. No acute abnormality about the orbits. Scalp soft tissues within normal limits. Mild mucosal thickening within the ethmoidal air cells, maxillary sinuses, and sphenoid sinuses. Paranasal sinuses are otherwise clear. Trace opacity within the right mastoid air cells. Calvarium intact. IMPRESSION: 1. Continued interval evolution of large ischemic right MCA territory infarct, overall stable in size and distribution relative to recent MRI. No evidence for hemorrhagic transformation. Localized mass effect with effacement of the right lateral ventricle, right uncal herniation, and 4 mm of right-to-left shift present. No hydrocephalus or evidence for  ventricular trapping. 2. Stable size of smaller left anterior insular/subinsular ischemic infarct. No evidence for associated hemorrhage or mass effect. 3. No other acute intracranial process. Electronically Signed   By: Jeannine Boga M.D.   On: 07/02/2015 04:35   Mr Brain Wo Contrast  07/01/2015  CLINICAL DATA:  60 year old female with infective endocarditis on treatment with sudden onset of left-sided weakness. Post angiogram and endovascular treatment for right internal carotid artery terminus occlusion. Subsequent encounter. EXAM: MRI HEAD WITHOUT CONTRAST TECHNIQUE: Multiplanar, multiecho pulse sequences of the brain and surrounding structures were obtained without intravenous contrast. COMPARISON:  06/30/2015 head CT.  No comparison brain MR. FINDINGS: Exam is motion degraded. Large acute right middle cerebral artery distribution infarct involving portions the right temporal lobe, right frontal lobe, right parietal lobe, operculum region, right basal ganglia and right corona radiata with local mass effect upon the right lateral ventricle with slight bowing of the septum to the left by  2 mm. On gradient sequence no associated blood however, slight T1 hyperintensity within the right lenticular nucleus and subtle blood at this level therefore not excluded. Mild right uncal herniation Nonhemorrhagic acute anterior left subinsular/peri operculum infarct. Mild small vessel disease changes. No intracranial mass lesion noted on this unenhanced exam. Cerebellar tonsils slightly low lying but within the range of normal limits. Decreased signal intensity of bone marrow may be related to patient's habitus or anemia. At the level of the sella, major intracranial vascular structures are patent. Limited evaluation right middle cerebral artery branches. IMPRESSION: Large acute right middle cerebral artery distribution infarct involving portions the right temporal lobe, right frontal lobe, right parietal lobe,  operculum region, right basal ganglia and right corona radiata with local mass effect upon the right lateral ventricle with slight bowing of the septum to the left by 2 mm. On gradient sequence no associated blood however, slight T1 hyperintensity within the right lenticular nucleus and subtle blood at this level therefore not excluded. Mild right uncal herniation. Nonhemorrhagic acute anterior left subinsular/peri operculum infarct. Electronically Signed   By: Genia Del M.D.   On: 07/01/2015 16:34   Dg Chest Port 1 View  07/02/2015  CLINICAL DATA:  Endotracheal tube EXAM: PORTABLE CHEST 1 VIEW COMPARISON:  None. FINDINGS: Endotracheal 9 cm from carina. Consider advancement by 2-3 cm. Stable enlarged cardiac silhouette. There is central venous congestion similar prior. No pneumothorax. IMPRESSION: Endotracheal tube 9 cm from carina.  Consider advancement by 2-3 cm. Central venous congestion Electronically Signed   By: Suzy Bouchard M.D.   On: 07/02/2015 07:43   STUDIES:  CT chest 1/28 >2 cm LLL nodule  Echo 06/24/15 EF 60%, MV vegetation  CT head 2/5 >Positive for occlusion Right ICA/MCA , 7 mm occluded segment at M1 at right .   CULTURES:   ANTIBIOTICS: 2/5 Rocephin  2/5 Cipro>>>  SIGNIFICANT EVENTS: Admitted 1/22-2/2 for sepsis d/t pyelonephritis , bacteremia and endocarditis with splenic infarcts . Lung mass found on this admission.  LINES/TUBES:   DISCUSSION:  60  Yo female wth recent admission for sepsis with endocarditits on prolonged abx at discharge followed by ID with acute CVA most likely from embolization despite abx therapy. She is currently in IR for thrombectomy.    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  Smoker  M P:   Extubate. IS. O2. Swallow  evaluation.  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 P:  Primaxin. Neo for BP support. Hold outpatient anti-HTN.  RENAL A:  No active issues.  P:   Trend Bmet. Replace electrolytes as indicated. 3% saline.  GASTROINTESTINAL A:   No active issues  P:   PPI. Hold TF for extubation. SLP.  HEMATOLOGIC A:   Anemia  P:  Tr cbc   INFECTIOUS A:   MV Endocarditis  P:   Primaxin.  ENDOCRINE A:   Hyperglycemia  P:   SSI.  NEUROLOGIC A:   Right M1 Occlusion s/p IR Thrombectomy  P:   RASS goal: -1 to 0  Monitor closely  D/C sedation. Neo for target SBP of 130 Further imaging per neuro Sheaths out today 3% saline  FAMILY  - Updates: Family updated bedside.  - Inter-disciplinary family meet or Palliative Care meeting due by:  2/12  The patient is critically ill with multiple organ systems failure and requires high complexity decision making for assessment and support, frequent evaluation and titration of therapies, application of advanced monitoring technologies and extensive interpretation of multiple databases.   Critical Care Time devoted to patient care services described in this note is  45  Minutes. This time reflects time of care of this signee Dr Jennet Maduro. This critical care time does not reflect procedure time, or teaching time or supervisory time of PA/NP/Med student/Med Resident etc but could involve care discussion time.  Rush Farmer, M.D. [facility] Pulmonary/Critical Care Medicine. Pager: [phone]. After hours pager: [phone].  07/02/2015, 12:31 PM

## 2015-07-02 NOTE — Progress Notes (Signed)
eLink Physician-Brief Progress Note Patient Name: Becky Patterson DOB: 11-04-1955 MRN: [id number]   Date of Service  07/02/2015  HPI/Events of Note  Camera check on patient post extubation today. Resting comfortably in bed. Family at bedside. No evidence of respiratory distress.  eICU Interventions  Continue current plan of care.     Intervention Category Intermediate Interventions: Other:  Tera Partridge 07/02/2015, 10:32 PM

## 2015-07-02 NOTE — Procedures (Signed)
Extubation Procedure Note  Patient Details:   Name: Becky Patterson DOB: Sep 02, 1955 MRN: [id number]   Airway Documentation:     Evaluation  O2 sats: stable throughout Complications: No apparent complications Patient did tolerate procedure well. Bilateral Breath Sounds: Clear, Diminished Suctioning: Oral, Airway Yes   Patient extubated to a 4L [place]. RN and family at the bedside with RT. No stridor was noted. Cuff leak was heard. RT will continue to monitor.  Tiburcio Bash 07/02/2015, 11:50 AM

## 2015-07-02 NOTE — Progress Notes (Signed)
Notified Dr Pat Kocher regarding patient's sodium level. Orders received to restart hypertonic solution. RN will continue to monitor.

## 2015-07-02 NOTE — Progress Notes (Signed)
STROKE TEAM PROGRESS NOTE   SUBJECTIVE (INTERVAL HISTORY) Her husband and family are at the bedside. Remains intubated. RT working with pt. Family hoping for extubation today.   OBJECTIVE Temp:  [97.9 F (36.6 C)-100.6 F (38.1 C)] 99.2 F (37.3 C) (02/08 0400) Pulse Rate:  [71-100] 80 (02/08 0800) Cardiac Rhythm:  [-] Normal sinus rhythm (02/08 0800) Resp:  [13-30] 17 (02/08 0800) BP: (100-146)/(52-82) 121/63 mmHg (02/08 0800) SpO2:  [95 %-100 %] 100 % (02/08 0800) FiO2 (%):  [40 %] 40 % (02/08 0800) Weight:  [93.3 kg (205 lb 11 oz)] 93.3 kg (205 lb 11 oz) (02/08 0338)  CBC:   Recent Labs Lab 06/29/15 1014  06/30/15 0430 07/01/15 0548 07/02/15 0533  WBC 15.0*  --  14.5* 14.8* 13.0*  NEUTROABS 11.9*  --  11.3*  --   --   HGB 8.7*  9.5*  < > 8.1* 7.9* 7.0*  HCT 25.9*  28.0*  < > 24.0* 24.4* 22.5*  MCV 92.8  --  92.3 92.1 93.4  PLT 390  --  366 358 335  < > = values in this interval not displayed.  Basic Metabolic Panel:   Recent Labs Lab 07/01/15 0548  07/02/15 0028 07/02/15 0533  NA 137  < > 150* 144  K 3.6  --   --  3.4*  CL 105  --   --  112*  CO2 21*  --   --  25  GLUCOSE 168*  --   --  149*  BUN 7  --   --  12  CREATININE 0.77  --   --  0.75  CALCIUM 8.3*  --   --  8.3*  MG 2.1  --   --  1.9  PHOS 2.9  --   --  2.4*  < > = values in this interval not displayed.  Lipid Panel:     Component Value Date/Time   CHOL 64 06/30/2015 0430   TRIG 109 06/30/2015 0430   HDL 16* 06/30/2015 0430   CHOLHDL 4.0 06/30/2015 0430   VLDL 22 06/30/2015 0430   LDLCALC 26 06/30/2015 0430   HgbA1c:  Lab Results  Component Value Date   HGBA1C 6.3* 06/30/2015   Urine Drug Screen:     Component Value Date/Time   LABOPIA NONE DETECTED 06/29/2015 1048   COCAINSCRNUR NONE DETECTED 06/29/2015 1048   LABBENZ NONE DETECTED 06/29/2015 1048   AMPHETMU NONE DETECTED 06/29/2015 1048   THCU POSITIVE* 06/29/2015 1048   LABBARB NONE DETECTED 06/29/2015 1048       IMAGING  Ct Head Wo Contrast 07/02/2015 1. Continued interval evolution of large ischemic right MCA territory infarct, overall stable in size and distribution  Relative to recent MRI. No evidence for hemorrhagic transformation. Localized mass effect with effacement of the right lateral ventricle, right uncal herniation, and 4 mm of right-to-left shift present. No hydrocephalus or evidence for ventricular trapping. 2. Stable size of smaller left anterior insular/subinsular ischemic infarct. No evidence for associated hemorrhage or mass effect. 3. No other acute intracranial process. 06/29/2105 1. 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 2. Sparing of the superior right MCA division. This may be secondary to collaterals of the right anterior cerebral artery. 3. Nonhemorrhagic infarction within the basal ganglia. 06/29/2015   No adverse features status post Neurointervention for emergent right ICA terminus occlusion. No acute or developing right hemisphere infarct identified.  06/29/2015   Mild chronic microvascular ischemia.  No acute abnormality   Ct Angio  Head & Neck W/cm &/or Wo/cm 06/29/2015  1. Positive for emergent large vessel occlusion: Occluded right ICA terminus, right MCA and right ACA origins. 7-8 mm occluded segment of the right M1 segment but good right MCA M2 and distal collaterals. Also, the left A1 is dominant such that bilateral A2 and distal ACA flow appears symmetric.  2. Mildly to moderately tortuous cervical ICAs. Mild plaque at the right ICA origin and bulb without stenosis. Minimal calcified right ICA siphon plaque. Moderate plaque at the distal left ICA bulb but without hemodynamically significant stenosis. 3. Proximal left subclavian artery soft plaque without significant stenosis. Negative posterior circulation. 4. Grossly stable CT appearance of the brain since 1001 hours.   Cerebral angiogram  06/29/2015 bilateral CCA and right vertebral artery arteriograms followed  by attempted right ICA terminus and right MCA occlusion using 3 passes of the [employer] [place] stent retrieval device and 8 mg of superselective E Integrilin. Robust crass filling from left ICA versus ACom and R PCA leptomeningeals.  MRI brain  07/01/2015 Large acute right middle cerebral artery distribution infarct involving portions the right temporal lobe, right frontal lobe, right parietal lobe, operculum region, right basal ganglia and right corona radiata with local mass effect upon the right lateral ventricle with slight bowing of the septum to the left by 2 mm. On gradient sequence no associated blood however, slight T1 hyperintensity within the right lenticular nucleus and subtle blood at this level therefore not excluded. Mild right uncal herniation. Nonhemorrhagic acute anterior left subinsular/peri operculum infarct.  Dg Chest Port 1 View 07/02/2015 Endotracheal tube 9 cm from carina. Consider advancement by 2-3 cm. Central venous congestion 06/30/2105 Endotracheal to several cm above the carina as described. This could be advanced of 4-5 cm. 06/29/2105 1. Endotracheal tube tip at the thoracic inlet, interim advancement of approximately 3-4 cm should be considered. NG tube in stable position. The 2. Interim partial clearing of right upper lobe atelectasis. 3. Cardiomegaly with mild pulmonary interstitial prominence. A mild component of congestive heart failure cannot be excluded.  06/29/2015  Endotracheal tube is 6 cm above the carina, recommend advancing 2 cm Right upper lobe collapse    PHYSICAL EXAM Elderly caucasian lady who is intubated. . Afebrile. Head is nontraumatic. Neck is supple without bruit.    Cardiac exam no murmur or gallop. Lungs are clear to auscultation. Distal pulses are well felt. She has a diffuse maculopapular rash which is new Neurological Exam :  Drowsy but can be easily aroused. Right gaze preference unable to look to the left past midline. Blinks to threat on the right  but not on the left. Pupils irregular but reactive. Fundi could not be visualized. Follows simple midline and commands on the right side consistently. No left hemi-neglect. Able to recognize her own left side. Able to point towards family members. Normal strength in the right. Left hemiplegia with only trace withdrawal to painful stimuli in the legs and none in the arm. Diminished on the left side. Right plantar downgoing left upgoing.   ASSESSMENT/PLAN Becky Patterson is a 60 y.o. female with history of infective endocarditis presenting with left arm weakness. She did not receive IV t-PA due to recent endocarditis. She was taken to IR where there was attempted revascularization of the right MCA occlusion using mechanical thrombectomy and intra-arterial Integrilin.   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 Also L subinsular/periopercular infarct  TEE 06/24/2015 MV endocarditis, vegetation both leaflets but trivial MR  LDL 26, at goal < 70  HgbA1c 6.9 in Jan  SCDs for VTE prophylaxis Diet NPO time specified  No antithrombotic prior to admission, now on No antithrombotic due to endocarditis and risk of bleeing  Ongoing aggressive stroke risk factor management  Therapy recommendations:  pending   Disposition:  pending   Acute Respiratory  Failure  intubated for neuro intervention  CCM following for extubation as appropriate  Cytotoxic Cerebral Edema  Induced hypernatremia  Started on 3% to induce hypernatremia in order to decrease cerebral edema  Na up to 150 during the night, dropped to 44 this am after 3% decreased  Increase 3% back to 75 ml/hr  Goal Na 150-155  MV Endocarditis with splenic infarcts  dx 05/2015 with sepsis from enterococcus UTI, pyelo, bacteremia   on Cipro 750 mg bid since 06/25/15 (planned total 6 weeks treatment with fluoroquinolone, had been on rocephin 1/23 - 1/30 (no abx 1/31))  new CTX 2 mg IV q 24h this admission  Cipro and CTX changed to Primaxin  WBC 15.0-> 13.0  Has PICC line   Planned OP followup - by ID and Cardiology to monitor vegetation  Maculopapular Rash with itching  Over thorax  ? Transfusion reaction vs abx reaction. Unsure etiology  Rocephin and Cipro changed to Primaxin  Treated with Benadryl   Acute Blood Loss Anemia Transfusion Reaction Anemia of Chronic disease  Hgb 9.5 on admission  Received 1 unit. 2nd unit held due to transfusion reaction of T 101.5  Rash following blood transfusion, unclear if related to blood or abx  Hgb down to 7.0 this am  Hyportension  On neo for SBP goal 110-130  Increase SBP goal to > 120   Still on neo, continue to wean   Lung Mass and Liver Mass  1.8 superior segment LLL lung mass and liver mass  Metastasis of unknown Primary vs hamartoma  oncology has not seen pt last admission - [facility] at 930a  OP PET is pending late Feb.   Diabetes  HgbA1c 6.9, at goal < 7.0  Other Stroke Risk Factors  smoker   THC, UDS positive this admission  Obesity, Body mass index is 37.61 kg/(m^2).   Other Active Problems  Hyponatremia  Hospital day # [place] Pager information 07/02/2015 9:03 AM  I have personally examined this patient,  reviewed notes, independently viewed imaging studies, participated in medical decision making and plan of care. I have made any additions or clarifications directly to the above note. Agree with note above. Patient seems clinically and radiologically stable. Serum sodium is yet below goal hence we'll increase hypertonic saline drip to 75 mL an hour. Long discussion the bedside with patient's husband and daughter and answered questions This patient is critically ill and at significant risk of  neurological worsening, death and care requires constant monitoring of vital signs, hemodynamics,respiratory and cardiac monitoring, extensive review of multiple databases, frequent neurological assessment, discussion with family, other specialists and medical decision making of high complexity.I have made any additions or clarifications directly to the above note.This critical care time does not reflect procedure time, or teaching time or supervisory time of PA/NP/Med Resident etc but could involve care discussion time.  I spent 30 minutes of neurocritical care time  in the care of  this patient.     Antony Contras, MD Medical Director [place] Pager: [phone] 07/02/2015 12:29 PM    To contact Stroke Continuity provider, please refer to [email or web address] After hours, contact General Neurology

## 2015-07-02 NOTE — Progress Notes (Signed)
OT Cancellation Note  Patient Details Name: RHYLIN VENTERS MRN: [id number] DOB: [birth year]   Cancelled Treatment:    Reason Eval/Treat Not Completed: Medical issues which prohibited therapy -- pt weaning with plan to extubate.  Will reattempt this pm as schedule allows.   Darlina Rumpf [place], OTR/L [phone]  07/02/2015, 11:25 AM

## 2015-07-02 NOTE — Progress Notes (Signed)
Patient clearly not protecting her airway.  Had a family meeting and after discussion, decision was made to make patient a full DNR and if the situation arises that she is starting to struggle, will make patient a full comfort care.  Will call the palliative care team to help with symptom management.  Rush Farmer, M.D. [facility] Pulmonary/Critical Care Medicine. Pager: [phone]. After hours pager: [phone].

## 2015-07-03 ENCOUNTER — Inpatient Hospital Stay (HOSPITAL_COMMUNITY): Payer: 59

## 2015-07-03 DIAGNOSIS — I69391 Dysphagia following cerebral infarction: Secondary | ICD-10-CM

## 2015-07-03 DIAGNOSIS — H5340 Unspecified visual field defects: Secondary | ICD-10-CM | POA: Diagnosis present

## 2015-07-03 DIAGNOSIS — I69922 Dysarthria following unspecified cerebrovascular disease: Secondary | ICD-10-CM

## 2015-07-03 DIAGNOSIS — I63411 Cerebral infarction due to embolism of right middle cerebral artery: Principal | ICD-10-CM

## 2015-07-03 DIAGNOSIS — I69322 Dysarthria following cerebral infarction: Secondary | ICD-10-CM

## 2015-07-03 DIAGNOSIS — G8194 Hemiplegia, unspecified affecting left nondominant side: Secondary | ICD-10-CM | POA: Diagnosis present

## 2015-07-03 DIAGNOSIS — R1310 Dysphagia, unspecified: Secondary | ICD-10-CM

## 2015-07-03 DIAGNOSIS — Z515 Encounter for palliative care: Secondary | ICD-10-CM | POA: Insufficient documentation

## 2015-07-03 DIAGNOSIS — Z7189 Other specified counseling: Secondary | ICD-10-CM | POA: Insufficient documentation

## 2015-07-03 LAB — SODIUM
SODIUM: 140 mmol/L (ref 135–145)
Sodium: 144 mmol/L (ref 135–145)

## 2015-07-03 LAB — GLUCOSE, CAPILLARY
GLUCOSE-CAPILLARY: 116 mg/dL — AB (ref 65–99)
GLUCOSE-CAPILLARY: 112 mg/dL — AB (ref 65–99)
GLUCOSE-CAPILLARY: 114 mg/dL — AB (ref 65–99)
GLUCOSE-CAPILLARY: 120 mg/dL — AB (ref 65–99)
GLUCOSE-CAPILLARY: 145 mg/dL — AB (ref 65–99)
GLUCOSE-CAPILLARY: 156 mg/dL — AB (ref 65–99)

## 2015-07-03 LAB — CBC
HCT: 22.6 % — ABNORMAL LOW (ref 36.0–46.0)
HEMOGLOBIN: 7.3 g/dL — AB (ref 12.0–15.0)
MCH: 30.2 pg (ref 26.0–34.0)
MCHC: 32.3 g/dL (ref 30.0–36.0)
MCV: 93.4 fL (ref 78.0–100.0)
PLATELETS: 355 10*3/uL (ref 150–400)
RBC: 2.42 MIL/uL — AB (ref 3.87–5.11)
RDW: 14.4 % (ref 11.5–15.5)
WBC: 12.5 10*3/uL — AB (ref 4.0–10.5)

## 2015-07-03 LAB — BASIC METABOLIC PANEL
ANION GAP: 11 (ref 5–15)
BUN: 11 mg/dL (ref 6–20)
CHLORIDE: 107 mmol/L (ref 101–111)
CO2: 25 mmol/L (ref 22–32)
Calcium: 8.6 mg/dL — ABNORMAL LOW (ref 8.9–10.3)
Creatinine, Ser: 0.76 mg/dL (ref 0.44–1.00)
GFR calc Af Amer: >60 mL/min (ref >60)
GLUCOSE: 127 mg/dL — AB (ref 65–99)
POTASSIUM: 3.4 mmol/L — AB (ref 3.5–5.1)
Sodium: 143 mmol/L (ref 135–145)

## 2015-07-03 LAB — PHOSPHORUS: PHOSPHORUS: 3.3 mg/dL (ref 2.5–4.6)

## 2015-07-03 LAB — MAGNESIUM: Magnesium: 1.8 mg/dL (ref 1.7–2.4)

## 2015-07-03 MED ORDER — ANTISEPTIC ORAL RINSE SOLUTION (CORINZ)
7.0000 mL | Freq: Two times a day (BID) | OROMUCOSAL | Status: DC
Start: 2015-07-03 — End: 2015-07-07
  Administered 2015-07-04 – 2015-07-07 (×7): 7 mL via OROMUCOSAL

## 2015-07-03 NOTE — Progress Notes (Signed)
Pharmacy Antibiotic Note  Becky Patterson is a 60 y.o. female on day #4 primaxin for enterobacter infective endocarditis. Renal function stable.  Plan: 1) Continue Primaxin '500mg'$  IV q6  Height: '5\' 2"'$  (157.5 cm) Weight: 203 lb 7.8 oz (92.3 kg) IBW/kg (Calculated) : 50.1  Temp (24hrs), Avg:98.9 F (37.2 C), Min:98.2 F (36.8 C), Max:99.9 F (37.7 C)   Recent Labs Lab 06/29/15 1014 06/30/15 0430 07/01/15 0548 07/02/15 0533 07/03/15 0545  WBC 15.0* 14.5* 14.8* 13.0* 12.5*  CREATININE 1.04*  0.90 0.85 0.77 0.75 0.76    Estimated Creatinine Clearance: 78.1 mL/min (by C-G formula based on Cr of 0.76).    Allergies  Allergen Reactions  . Codeine Nausea And Vomiting    Antimicrobials this admission: Cipro from PTA >> 2/6 CTX 2/5 >>2/6 Primaxin 2/6>>  Dose adjustments this admission: N/A  Microbiology results: 2/5 BCx x2 - NGTD 2/5 MRSA PCR - NEG  Nena Jordan, PharmD, BCPS 07/03/2015 7:24 AM

## 2015-07-03 NOTE — Progress Notes (Signed)
Rehab Admissions Coordinator Note:  Patient was screened by Retta Diones for appropriateness for an Inpatient Acute Rehab Consult.  At this time, we are recommending Inpatient Rehab consult.  Jodell Cipro M 07/03/2015, 2:15 PM  I can be reached at [phone].

## 2015-07-03 NOTE — Evaluation (Signed)
Physical Therapy Evaluation Patient Details Name: NARAYA STONEBERG MRN: [id number] DOB: 12/05/1955 Today's Date: 07/03/2015   History of Present Illness  pt rpesents with R MCA CVA s/p Thrombectomy.  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  pt with no pertinent PMH.    Clinical Impression  Pt following simple directions on R side, but no active participation on L side and pain sensation delayed.  Pt with definite R gaze preference, but is able to cross midline to L side with cueing.  Pt very neglectful of L side throughout session.  Pt family present during session and discussed pt's need for further therapies to maximize independence and decrease burden of care prior to returning to home.      Follow Up Recommendations CIR    Equipment Recommendations  None recommended by PT    Recommendations for Other Services Rehab consult     Precautions / Restrictions Precautions Precautions: Fall Restrictions Weight Bearing Restrictions: No      Mobility  Bed Mobility Overal bed mobility: Needs Assistance;+2 for physical assistance Bed Mobility: Supine to Sit     Supine to sit: Mod assist;+2 for physical assistance;HOB elevated     General bed mobility comments: pt does attempt to follow cues for coming to sitting EOB.  pt moves R LE off of bed and reached for PT with her R UE.  Cues and facilitation for movement of L LE and bringing trunk to sitting.    Transfers Overall transfer level: Needs assistance Equipment used: 2 person hand held assist Transfers: Sit to/from [facility] Sit to Stand: Max assist;+2 physical assistance Stand pivot transfers: Total assist;+2 physical assistance       General transfer comment: pt does follow directions with R UE and LE, but no active participation in L side.  Blocked L knee and utilized pad under hips to come to standing and complete pivot to recliner.    Ambulation/Gait                 Stairs            Wheelchair Mobility    Modified Rankin (Stroke Patients Only) Modified Rankin (Stroke Patients Only) Pre-Morbid Rankin Score: No symptoms Modified Rankin: Severe disability     Balance Overall balance assessment: Needs assistance Sitting-balance support: Single extremity supported;Feet supported Sitting balance-Leahy Scale: Poor   Postural control: Posterior lean Standing balance support: During functional activity Standing balance-Leahy Scale: Poor                               Pertinent Vitals/Pain Pain Assessment: No/denies pain    Home Living Family/patient expects to be discharged to:: Inpatient rehab Living Arrangements: Spouse/significant other Available Help at Discharge: Family;Available 24 hours/day Type of Home: House         Home Equipment: None      Prior Function Level of Independence: Independent         Comments: Worked as Air traffic controller.     Hand Dominance        Extremity/Trunk Assessment   Upper Extremity Assessment: Defer to OT evaluation           Lower Extremity Assessment: LLE deficits/detail   LLE Deficits / Details: No active movement noted in LE.  pt has delayed response to painful stimulation.    Cervical / Trunk Assessment: Normal  Communication   Communication: Expressive difficulties  Cognition  Arousal/Alertness: Lethargic Behavior During Therapy: Flat affect Overall Cognitive Status: Difficult to assess                      General Comments      Exercises        Assessment/Plan    PT Assessment Patient needs continued PT services  PT Diagnosis Hemiplegia non-dominant side   PT Problem List Decreased strength;Decreased activity tolerance;Decreased balance;Decreased mobility;Decreased coordination;Decreased cognition;Decreased knowledge of use of DME;Decreased safety awareness;Impaired sensation;Obesity  PT Treatment Interventions DME instruction;Gait  training;Functional mobility training;Therapeutic activities;Therapeutic exercise;Balance training;Neuromuscular re-education;Cognitive remediation;Patient/family education   PT Goals (Current goals can be found in the Care Plan section) Acute Rehab PT Goals Patient Stated Goal: Per family to [facility] better PT Goal Formulation: With patient/family Time For Goal Achievement: 07/17/15 Potential to Achieve Goals: Good    Frequency Min 4X/week   Barriers to discharge        Co-evaluation               End of Session Equipment Utilized During Treatment: Gait belt Activity Tolerance: Patient limited by fatigue Patient left: in chair;with call bell/phone within reach;with chair alarm set Nurse Communication: Mobility status;Need for lift equipment         Time: 1000-1028 PT Time Calculation (min) (ACUTE ONLY): 28 min   Charges:   PT Evaluation $PT Eval Moderate Complexity: 1 Procedure PT Treatments $Therapeutic Activity: 8-22 mins   PT G CodesCatarina Hartshorn, [place] [phone] 07/03/2015, 1:42 PM

## 2015-07-03 NOTE — Evaluation (Signed)
Speech Language Pathology Evaluation Patient Details Name: Becky Patterson MRN: [id number] DOB: [birth year] Today's Date: 07/03/2015 Time: 1093-2355 SLP Time Calculation (min) (ACUTE ONLY): 15 min  Problem List:  Patient Active Problem List   Diagnosis Date Noted  . Palliative care encounter   . DNR (do not resuscitate) discussion   . Acute respiratory failure with hypoxia ([place]) 06/30/2015  . Arterial hypotension 06/30/2015  . Acute encephalopathy   . Stroke due to embolism ([place])   . Stroke (cerebrum) ([place]) 06/29/2015  . Colitis   . Pyelonephritis   . Enterococcal bacteremia 06/19/2015  . Sepsis secondary to UTI ([place])   . Mass of lower lobe of left lung   . Tobacco abuse   . Anemia of chronic disease   . Hyponatremia   . Hyperglycemia   . Hypokalemia   . Lung mass 06/15/2015   Past Medical History: History reviewed. No pertinent past medical history. Past Surgical History:  Past Surgical History  Procedure Laterality Date  . Back surgery    . Tee without cardioversion N/A 06/24/2015    Procedure: TRANSESOPHAGEAL ECHOCARDIOGRAM (TEE);  Surgeon: Larey Dresser, MD;  Location: [facility];  Service: Cardiovascular;  Laterality: N/A;  . Radiology with anesthesia N/A 06/29/2015    Procedure: RADIOLOGY WITH ANESTHESIA;  Surgeon: Luanne Bras, MD;  Location: [facility];  Service: Radiology;  Laterality: N/A;   HPI:  Becky Patterson is a 60 y.o. female history of infective endocarditis that was treated with discharge on 2/2, continuing on antibiotics at home, lung mass noted on that admission. Re-admitted with left sided weakness that progressed after admission. MRI: Large acute right middle cerebral artery distribution infarct   Assessment / Plan / Recommendation Clinical Impression  Cognitive-linguistic evaluation complete. Patient presents with deficits in the areas of sustained attention, intellectual awareness, and problem solving in addition to notable left sided inattention,  although able to cross midline to locate objects and clinician with max cues. Patient will benefit from SLP f/u to maximize cognitive abilities and decrease burden of care post d/c. Recommend CIR consult.     SLP Assessment  Patient needs continued Speech Lanaguage Pathology Services    Follow Up Recommendations  Inpatient Rehab    Frequency and Duration min 2x/week  2 weeks      SLP Evaluation Prior Functioning  Cognitive/Linguistic Baseline: Within functional limits Type of Home: House Available Help at Discharge: Family;Available 24 hours/day   Cognition  Overall Cognitive Status: Impaired/Different from baseline Arousal/Alertness: Awake/alert Orientation Level: Oriented to person;Oriented to place;Oriented to situation Attention: Sustained Sustained Attention: Impaired Sustained Attention Impairment: Verbal complex;Functional complex Memory:  (TBD) Awareness: Impaired Awareness Impairment: Intellectual impairment Problem Solving:  (TBD) Safety/Judgment: Impaired    Comprehension  Auditory Comprehension Overall Auditory Comprehension: Appears within functional limits for tasks assessed Visual Recognition/Discrimination Discrimination: Within Function Limits Reading Comprehension Reading Status: Not tested    Expression Expression Primary Mode of Expression: Verbal Verbal Expression Overall Verbal Expression: Appears within functional limits for tasks assessed   Oral / Motor  Oral Motor/Sensory Function Overall Oral Motor/Sensory Function: Moderate impairment Facial ROM: Reduced left;Suspected CN VII (facial) dysfunction Facial Symmetry: Abnormal symmetry left;Suspected CN VII (facial) dysfunction Facial Strength: Reduced left;Suspected CN VII (facial) dysfunction Facial Sensation: Within Functional Limits Lingual ROM: Reduced left;Suspected CN XII (hypoglossal) dysfunction Lingual Symmetry: Abnormal symmetry left;Suspected CN XII (hypoglossal)  dysfunction Lingual Strength: Reduced;Suspected CN XII (hypoglossal) dysfunction Lingual Sensation: Within Functional Limits Velum: Within Functional Limits Mandible: Within Functional Limits Motor Speech Overall  Motor Speech: Impaired Respiration: Impaired Level of Impairment: Phrase Phonation: Low vocal intensity;Hoarse Resonance: Within functional limits Articulation: Impaired Level of Impairment: Word Intelligibility: Intelligibility reduced Word: 50-74% accurate Phrase: 50-74% accurate Sentence: 50-74% accurate Conversation: 50-74% accurate Motor Planning: Witnin functional limits Effective Techniques: Increased vocal intensity;Over-articulate   GO            Gabriel Rainwater MA, CCC-SLP [phone]         Gabriel Rainwater [place] 07/03/2015, 2:43 PM

## 2015-07-03 NOTE — Progress Notes (Signed)
Daily Progress Note   Patient Name: Becky Patterson       Date: 07/03/2015 DOB: 1955/06/21  Age: 60 y.o. MRN#: [id number] Attending Physician: Garvin Fila, MD Primary Care Physician: No primary care provider on file. Admit Date: 06/29/2015  Reason for Consultation/Follow-up: Establishing goals of care and Psychosocial/spiritual support  Subjective:  - continued conversation regarding [place] within the context of improvement from recent CVA.  This has been most difficult for Mr Vanderhoof to conceptualize the big picture including known lung mass, endocarditis and now dealing with residual effects of CVA.  He needs continued support and education helping him navigate healthcare options and anticipatory care needs for his wife.  -all questions and concerns addressed  -PMT will continue to support holistically    Length of Stay: 4 days  Current Medications: Scheduled Meds:  . antiseptic oral rinse  7 mL Mouth Rinse 10 times per day  . chlorhexidine gluconate  15 mL Mouth Rinse BID  . imipenem-cilastatin  500 mg Intravenous Q6H  . insulin aspart  0-15 Units Subcutaneous 6 times per day  . multivitamin with minerals  1 tablet Per Tube Daily  . sodium chloride flush  10-40 mL Intracatheter Q12H    Continuous Infusions:    PRN Meds: diphenhydrAMINE, fentaNYL (SUBLIMAZE) injection, sodium chloride flush  Physical Exam: Physical Exam  Constitutional: She appears well-developed.  HENT:  Head: Normocephalic.  Pulmonary/Chest: She has decreased breath sounds in the right lower field and the left lower field.  Abdominal: Soft. Normal appearance and bowel sounds are normal.  Neurological: She is alert. A cranial nerve deficit and sensory deficit is present. She exhibits abnormal muscle tone.   Left sided paresis   Skin: Skin is warm and dry.                Vital Signs: BP 114/69 mmHg  Pulse 25  Temp(Src) 98.4 F (36.9 C) (Oral)  Resp 19  Ht '5\' 2"'$  (1.575 m)  Wt 92.3 kg (203 lb 7.8 oz)  BMI 37.21 kg/m2  SpO2 100% SpO2: SpO2: 100 % O2 Device: O2 Device: Nasal Cannula O2 Flow Rate: O2 Flow Rate (L/min): 4 L/min  Intake/output summary:  Intake/Output Summary (Last 24 hours) at 07/03/15 1106 Last data filed at 07/03/15 0955  Gross per 24 hour  Intake 1400.06  ml  Output   3421 ml  Net -2020.94 ml   LBM: Last BM Date: 06/30/15 Baseline Weight: Weight: 87.2 kg (192 lb 3.9 oz) Most recent weight: Weight: 92.3 kg (203 lb 7.8 oz)       Palliative Assessment/Data: Flowsheet Rows        Most Recent Value   Intake Tab    Referral Department  Critical care   Unit at Time of Referral  ICU   Palliative Care Primary Diagnosis  Sepsis/Infectious Disease   Date Notified  07/02/15   Palliative Care Type  New Palliative care   Reason for referral  Clarify Goals of Care, Non-pain Symptom   Date of Admission  06/29/15   Date first seen by Palliative Care  07/02/15   # of days Palliative referral response time  0 Day(s)   # of days IP prior to Palliative referral  3   Clinical Assessment    Psychosocial & Spiritual Assessment    Palliative Care Outcomes       Additional Data Reviewed: CBC    Component Value Date/Time   WBC 12.5* 07/03/2015 0545   RBC 2.42* 07/03/2015 0545   RBC 2.70* 06/15/2015 2248   HGB 7.3* 07/03/2015 0545   HCT 22.6* 07/03/2015 0545   PLT 355 07/03/2015 0545   MCV 93.4 07/03/2015 0545   MCH 30.2 07/03/2015 0545   MCHC 32.3 07/03/2015 0545   RDW 14.4 07/03/2015 0545   LYMPHSABS 1.9 06/30/2015 0430   MONOABS 1.0 06/30/2015 0430   EOSABS 0.3 06/30/2015 0430   BASOSABS 0.0 06/30/2015 0430    CMP     Component Value Date/Time   NA 143 07/03/2015 0545   K 3.4* 07/03/2015 0545   CL 107 07/03/2015 0545   CO2 25 07/03/2015 0545   GLUCOSE  127* 07/03/2015 0545   BUN 11 07/03/2015 0545   CREATININE 0.76 07/03/2015 0545   CALCIUM 8.6* 07/03/2015 0545   PROT 6.4* 06/29/2015 1014   ALBUMIN 2.2* 06/29/2015 1014   AST 20 06/29/2015 1014   ALT 16 06/29/2015 1014   ALKPHOS 76 06/29/2015 1014   BILITOT 0.4 06/29/2015 1014   GFRNONAA >60 07/03/2015 0545   GFRAA >60 07/03/2015 0545       Problem List:  Patient Active Problem List   Diagnosis Date Noted  . Palliative care encounter   . DNR (do not resuscitate) discussion   . Acute respiratory failure with hypoxia ([place]) 06/30/2015  . Arterial hypotension 06/30/2015  . Acute encephalopathy   . Stroke due to embolism ([place])   . Stroke (cerebrum) ([place]) 06/29/2015  . Colitis   . Pyelonephritis   . Enterococcal bacteremia 06/19/2015  . Sepsis secondary to UTI ([place])   . Mass of lower lobe of left lung   . Tobacco abuse   . Anemia of chronic disease   . Hyponatremia   . Hyperglycemia   . Hypokalemia   . Lung mass 06/15/2015     Palliative Care Assessment & Plan    1.Code Status:  DNR    Code Status Orders        Start     Ordered   07/02/15 1252  Do not attempt resuscitation (DNR)   Continuous    Question Answer Comment  In the event of cardiac or respiratory ARREST Do not call a "code blue"   In the event of cardiac or respiratory ARREST Do not perform Intubation, CPR, defibrillation or ACLS   In the event of cardiac or  respiratory ARREST Use medication by any route, position, wound care, and other measures to relive pain and suffering. May use oxygen, suction and manual treatment of airway obstruction as needed for comfort.      07/02/15 1251    Code Status History    Date Active Date Inactive Code Status Order ID Comments User Context   06/29/2015  4:06 PM 07/02/2015 12:51 PM Full Code [id number]  Greta Doom, MD Inpatient   06/15/2015  9:48 PM 06/26/2015  3:29 PM Full Code [id number]  Edwin Dada, MD Inpatient       2. Goals of  Care/Additional Recommendations:   Desire for further Chaplaincy support:no  Psycho-social Needs: education and emotional  support   Symptom Management:      1. Weakness/ left sided: aggressive PT/OT/Speech therapy   Palliative Prophylaxis:   Aspiration, Bowel Regimen, Delirium Protocol, Frequent Pain Assessment, Oral Care and Turn Reposition   Discharge Planning:  [place] for rehab with Palliative care service follow-up   Care plan was discussed with Dr Leonie Man  Thank you for allowing the Palliative Medicine Team to assist in the care of this patient.   Time In: 1000 Time Out: 1100 Total Time 60  Prolonged Time Billed  no         Knox Royalty, NP  07/03/2015, 11:06 AM  Please contact Palliative Medicine Team phone at [phone] for questions and concerns.

## 2015-07-03 NOTE — Progress Notes (Signed)
STROKE TEAM PROGRESS NOTE   SUBJECTIVE (INTERVAL HISTORY) Her husband and family are at the bedside. Patient was extubated yesterday and seems to be doing fine. She continues to have right gaze preference and dense left hemiplegia with neglect.. She was restarted on hypertonic saline as sodium was below target   OBJECTIVE Temp:  [98.1 F (36.7 C)-99.9 F (37.7 C)] 98.1 F (36.7 C) (02/09 1156) Pulse Rate:  [25-98] 25 (02/09 1045) Cardiac Rhythm:  [-] Normal sinus rhythm (02/09 0800) Resp:  [14-24] 19 (02/09 1100) BP: (107-139)/(63-85) 114/69 mmHg (02/09 1100) SpO2:  [92 %-100 %] 100 % (02/09 1100) Weight:  [203 lb 7.8 oz (92.3 kg)] 203 lb 7.8 oz (92.3 kg) (02/09 0300)  CBC:   Recent Labs Lab 06/29/15 1014  06/30/15 0430  07/02/15 0533 07/03/15 0545  WBC 15.0*  --  14.5*  < > 13.0* 12.5*  NEUTROABS 11.9*  --  11.3*  --   --   --   HGB 8.7*  9.5*  < > 8.1*  < > 7.0* 7.3*  HCT 25.9*  28.0*  < > 24.0*  < > 22.5* 22.6*  MCV 92.8  --  92.3  < > 93.4 93.4  PLT 390  --  366  < > 335 355  < > = values in this interval not displayed.  Basic Metabolic Panel:   Recent Labs Lab 07/02/15 0533  07/03/15 0001 07/03/15 0545  NA 144  < > 144 143  K 3.4*  --   --  3.4*  CL 112*  --   --  107  CO2 25  --   --  25  GLUCOSE 149*  --   --  127*  BUN 12  --   --  11  CREATININE 0.75  --   --  0.76  CALCIUM 8.3*  --   --  8.6*  MG 1.9  --   --  1.8  PHOS 2.4*  --   --  3.3  < > = values in this interval not displayed.  Lipid Panel:     Component Value Date/Time   CHOL 64 06/30/2015 0430   TRIG 109 06/30/2015 0430   HDL 16* 06/30/2015 0430   CHOLHDL 4.0 06/30/2015 0430   VLDL 22 06/30/2015 0430   LDLCALC 26 06/30/2015 0430   HgbA1c:  Lab Results  Component Value Date   HGBA1C 6.3* 06/30/2015   Urine Drug Screen:     Component Value Date/Time   LABOPIA NONE DETECTED 06/29/2015 1048   COCAINSCRNUR NONE DETECTED 06/29/2015 1048   LABBENZ NONE DETECTED 06/29/2015 1048    AMPHETMU NONE DETECTED 06/29/2015 1048   THCU POSITIVE* 06/29/2015 1048   LABBARB NONE DETECTED 06/29/2015 1048      IMAGING  Ct Head Wo Contrast 07/02/2015 1. Continued interval evolution of large ischemic right MCA territory infarct, overall stable in size and distribution  Relative to recent MRI. No evidence for hemorrhagic transformation. Localized mass effect with effacement of the right lateral ventricle, right uncal herniation, and 4 mm of right-to-left shift present. No hydrocephalus or evidence for ventricular trapping. 2. Stable size of smaller left anterior insular/subinsular ischemic infarct. No evidence for associated hemorrhage or mass effect. 3. No other acute intracranial process. 06/29/2105 1. 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 2. Sparing of the superior right MCA division. This may be secondary to collaterals of the right anterior cerebral artery. 3. Nonhemorrhagic infarction within the basal ganglia. 06/29/2015   No adverse features  status post Neurointervention for emergent right ICA terminus occlusion. No acute or developing right hemisphere infarct identified.  06/29/2015   Mild chronic microvascular ischemia.  No acute abnormality   Ct Angio Head & Neck W/cm &/or Wo/cm 06/29/2015  1. Positive for emergent large vessel occlusion: Occluded right ICA terminus, right MCA and right ACA origins. 7-8 mm occluded segment of the right M1 segment but good right MCA M2 and distal collaterals. Also, the left A1 is dominant such that bilateral A2 and distal ACA flow appears symmetric.  2. Mildly to moderately tortuous cervical ICAs. Mild plaque at the right ICA origin and bulb without stenosis. Minimal calcified right ICA siphon plaque. Moderate plaque at the distal left ICA bulb but without hemodynamically significant stenosis. 3. Proximal left subclavian artery soft plaque without significant stenosis. Negative posterior circulation. 4. Grossly stable CT appearance of the brain  since 1001 hours.   Cerebral angiogram  06/29/2015 bilateral CCA and right vertebral artery arteriograms followed by attempted right ICA terminus and right MCA occlusion using 3 passes of the [employer] [place] stent retrieval device and 8 mg of superselective E Integrilin. Robust crass filling from left ICA versus ACom and R PCA leptomeningeals.  MRI brain  07/01/2015 Large acute right middle cerebral artery distribution infarct involving portions the right temporal lobe, right frontal lobe, right parietal lobe, operculum region, right basal ganglia and right corona radiata with local mass effect upon the right lateral ventricle with slight bowing of the septum to the left by 2 mm. On gradient sequence no associated blood however, slight T1 hyperintensity within the right lenticular nucleus and subtle blood at this level therefore not excluded. Mild right uncal herniation. Nonhemorrhagic acute anterior left subinsular/peri operculum infarct.  Dg Chest Port 1 View 07/02/2015 Endotracheal tube 9 cm from carina. Consider advancement by 2-3 cm. Central venous congestion 06/30/2105 Endotracheal to several cm above the carina as described. This could be advanced of 4-5 cm. 06/29/2105 1. Endotracheal tube tip at the thoracic inlet, interim advancement of approximately 3-4 cm should be considered. NG tube in stable position. The 2. Interim partial clearing of right upper lobe atelectasis. 3. Cardiomegaly with mild pulmonary interstitial prominence. A mild component of congestive heart failure cannot be excluded.  06/29/2015  Endotracheal tube is 6 cm above the carina, recommend advancing 2 cm Right upper lobe collapse    PHYSICAL EXAM Elderly caucasian lady who is not in distress. . Afebrile. Head is nontraumatic. Neck is supple without bruit.    Cardiac exam no murmur or gallop. Lungs are clear to auscultation. Distal pulses are well felt. She has a diffuse maculopapular rash which is new Neurological Exam :  Awake  alert oriented x 2. Right gaze preference unable to look to the left past midline. Blinks to threat on the right but not on the left. Pupils irregular but reactive. Fundi could not be visualized. Follows simple midline and commands on the right side consistently. Mild left hemi-neglect. Not consistently able to recognize her own left side. Able to point towards family members. Normal strength in the right. Left hemiplegia with only trace withdrawal to painful stimuli in the legs and none in the arm. Diminished on the left side. Right plantar downgoing left upgoing.   ASSESSMENT/PLAN Becky Patterson is a 60 y.o. female with history of infective endocarditis presenting with left arm weakness. She did not receive IV t-PA due to recent endocarditis. She was taken to IR where there was attempted revascularization of the right  MCA occlusion using mechanical thrombectomy and intra-arterial Integrilin.   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 Also L subinsular/periopercular infarct  TEE 06/24/2015 MV endocarditis, vegetation both leaflets but trivial MR  LDL 26, at goal < 70  HgbA1c 6.9 in Jan  SCDs for VTE prophylaxis Diet NPO time specified  No antithrombotic prior to admission, now on No antithrombotic due to endocarditis and risk of  bleeing  Ongoing aggressive stroke risk factor management  Therapy recommendations:  pending   Disposition:  pending   Acute Respiratory Failure  intubated for neuro intervention  CCM following for extubation as appropriate  Cytotoxic Cerebral Edema  Induced hypernatremia  Started on 3% to induce hypernatremia in order to decrease cerebral edema  Na up to 150 during the night, dropped to 144 this am after 3% decreased Taper hypertonic saline drip and discontinue over 24 hours as patient is outside maximum peak edema.  MV Endocarditis with splenic infarcts  dx 05/2015 with sepsis from enterococcus UTI, pyelo, bacteremia   on Cipro 750 mg bid since 06/25/15 (planned total 6 weeks treatment with fluoroquinolone, had been on rocephin 1/23 - 1/30 (no abx 1/31))  new CTX 2 mg IV q 24h this admission  Cipro and CTX changed to Primaxin  WBC 15.0-> 13.0  Has PICC line   Planned OP followup - by ID and Cardiology to monitor vegetation  Maculopapular Rash with itching  Over thorax  ? Transfusion reaction vs abx reaction. Unsure etiology  Rocephin and Cipro changed to Primaxin  Treated with Benadryl   Acute Blood Loss Anemia Transfusion Reaction Anemia of Chronic disease  Hgb 9.5 on admission  Received 1 unit. 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   Diabetes  HgbA1c 6.9, at goal < 7.0  Other Stroke Risk Factors  smoker   THC, UDS positive this admission  Obesity, Body mass index is 37.21 kg/(m^2).   Other  Active Problems  Hyponatremia  [facility] [id number] P[place] information 07/03/2015 12:01 PM  I have personally examined this patient, reviewed notes, independently viewed imaging studies, participated in medical decision making and plan of care. I have made any additions or clarifications directly to the above note. Agree with note above. Patient seems clinically and  radiologically stable. Serum sodium is yet below goal but patient is 4 days post stroke hence Taper hypertonic saline drip and discontinue over 24 hours as patient is outside maximum peak edema. Also taper Neo-Synephrine drip to keep mean arterial pressure greater than 65. I had a long discussion with the patient's husband, daughter and other family members regarding goals of care and agree with DO NOT RESUSCITATE but want to pursue appropriate care. They will consider feeding tube but do not want tracheostomy or chest compression This patient is critically ill and at significant risk of neurological worsening, death and care requires constant monitoring of vital signs, hemodynamics,respiratory and cardiac monitoring, extensive review of multiple databases, frequent neurological assessment, discussion with family, other specialists and medical decision making of high complexity.I have made any additions or clarifications directly to the above note.This critical care time does not reflect procedure time, or teaching time or supervisory time of PA/NP/Med Resident etc but could involve care discussion time.  I spent 50 minutes of neurocritical care time  in the care of  this patient.     Antony Contras, MD Medical Director [facility] Pager: [phone] 07/03/2015 12:01 PM    To contact Stroke Continuity provider, please refer to [email or web address] After hours, contact General Neurology

## 2015-07-03 NOTE — Progress Notes (Signed)
PCCM PROGRESS NOTE  Subjective: Maintaining airway.  Vital Signs: BP 121/71 mmHg  Pulse 84  Temp(Src) 98.4 F (36.9 C) (Oral)  Resp 16  Ht '5\' 2"'$  (1.575 m)  Wt 203 lb 7.8 oz (92.3 kg)  BMI 37.21 kg/m2  SpO2 99%  General: alert Neuro: follows commands HEENT: no stridor Cardiac: regular Chest: no wheeze Abd: soft, non tender Ext: no edema Skin: no rashes   CBC Recent Labs     07/01/15  0548  07/02/15  0533  07/03/15  0545  WBC  14.8*  13.0*  12.5*  HGB  7.9*  7.0*  7.3*  HCT  24.4*  22.5*  22.6*  PLT  358  335  355    BMET Recent Labs     07/01/15  0548   07/02/15  0533   07/02/15  1847  07/03/15  0001  07/03/15  0545  NA  137   < >  144   < >  143  144  143  K  3.6   --   3.4*   --    --    --   3.4*  CL  105   --   112*   --    --    --   107  CO2  21*   --   25   --    --    --   25  BUN  7   --   12   --    --    --   11  CREATININE  0.77   --   0.75   --    --    --   0.76  GLUCOSE  168*   --   149*   --    --    --   127*   < > = values in this interval not displayed.    Electrolytes Recent Labs     07/01/15  0548  07/02/15  0533  07/03/15  0545  CALCIUM  8.3*  8.3*  8.6*  MG  2.1  1.9  1.8  PHOS  2.9  2.4*  3.3    ABG Recent Labs     07/01/15  0508  07/02/15  0415  PHART  7.366  7.422  PCO2ART  36.7  35.8  PO2ART  153*  150*    Glucose Recent Labs     07/02/15  1224  07/02/15  1547  07/02/15  2004  07/02/15  2320  07/03/15  0311  07/03/15  0812  GLUCAP  137*  137*  122*  116*  112*  114*    Imaging Ct Head Wo Contrast  07/02/2015  CLINICAL DATA:  Follow-up examination for stroke. EXAM: CT HEAD WITHOUT CONTRAST TECHNIQUE: Contiguous axial images were obtained from the base of the skull through the vertex without intravenous contrast. COMPARISON:  Prior CT from 06/29/2014 as well as prior MRI from 06/30/2014. FINDINGS: Extensive cytotoxic edema seen throughout the right MCA territory, consistent with large acute right MCA  territory infarct. Again, there is involvement of the right frontal, right parietal, and right temporal lobes, with involvement of the basal ganglia. Overall, distribution is stable from recent MRI. No evidence for hemorrhagic transformation on this examination. Persistent hyperdensity within the right M1 segment and right MCA at the sylvian fissure likely representing thrombosis. There is localized edema with partial effacement of the right lateral ventricle, similar to prior MRI. Mild bowing of  the septum pellucidum from right-to-left measures up to 4 mm. Right uncal herniation, similar to previous. Basilar cisterns minimally crowded at the level of the right uncus, but otherwise patent. No hydrocephalus or evidence for ventricular trapping. Cytotoxic edema within the anterior insular/subinsular region on the left also similar relative to previous MRI. No significant mass effect or evidence for hemorrhagic transformation. No new infarct.  No extra-axial fluid collection. No acute abnormality about the orbits. Scalp soft tissues within normal limits. Mild mucosal thickening within the ethmoidal air cells, maxillary sinuses, and sphenoid sinuses. Paranasal sinuses are otherwise clear. Trace opacity within the right mastoid air cells. Calvarium intact. IMPRESSION: 1. Continued interval evolution of large ischemic right MCA territory infarct, overall stable in size and distribution relative to recent MRI. No evidence for hemorrhagic transformation. Localized mass effect with effacement of the right lateral ventricle, right uncal herniation, and 4 mm of right-to-left shift present. No hydrocephalus or evidence for ventricular trapping. 2. Stable size of smaller left anterior insular/subinsular ischemic infarct. No evidence for associated hemorrhage or mass effect. 3. No other acute intracranial process. Electronically Signed   By: Jeannine Boga M.D.   On: 07/02/2015 04:35   Mr Brain Wo Contrast  07/01/2015   CLINICAL DATA:  60 year old female with infective endocarditis on treatment with sudden onset of left-sided weakness. Post angiogram and endovascular treatment for right internal carotid artery terminus occlusion. Subsequent encounter. EXAM: MRI HEAD WITHOUT CONTRAST TECHNIQUE: Multiplanar, multiecho pulse sequences of the brain and surrounding structures were obtained without intravenous contrast. COMPARISON:  06/30/2015 head CT.  No comparison brain MR. FINDINGS: Exam is motion degraded. Large acute right middle cerebral artery distribution infarct involving portions the right temporal lobe, right frontal lobe, right parietal lobe, operculum region, right basal ganglia and right corona radiata with local mass effect upon the right lateral ventricle with slight bowing of the septum to the left by 2 mm. On gradient sequence no associated blood however, slight T1 hyperintensity within the right lenticular nucleus and subtle blood at this level therefore not excluded. Mild right uncal herniation Nonhemorrhagic acute anterior left subinsular/peri operculum infarct. Mild small vessel disease changes. No intracranial mass lesion noted on this unenhanced exam. Cerebellar tonsils slightly low lying but within the range of normal limits. Decreased signal intensity of bone marrow may be related to patient's habitus or anemia. At the level of the sella, major intracranial vascular structures are patent. Limited evaluation right middle cerebral artery branches. IMPRESSION: Large acute right middle cerebral artery distribution infarct involving portions the right temporal lobe, right frontal lobe, right parietal lobe, operculum region, right basal ganglia and right corona radiata with local mass effect upon the right lateral ventricle with slight bowing of the septum to the left by 2 mm. On gradient sequence no associated blood however, slight T1 hyperintensity within the right lenticular nucleus and subtle blood at this level  therefore not excluded. Mild right uncal herniation. Nonhemorrhagic acute anterior left subinsular/peri operculum infarct. Electronically Signed   By: Genia Del M.D.   On: 07/01/2015 16:34   Dg Chest Port 1 View  07/03/2015  CLINICAL DATA:  Endotracheal tube. EXAM: PORTABLE CHEST 1 VIEW COMPARISON:  07/02/2015 FINDINGS: Endotracheal tube not visualized and has presumably been removed. A right-sided PICC line terminates at the mid to low SVC. Midline trachea. Cardiomegaly accentuated by AP portable technique. No pleural effusion or pneumothorax. Improved to resolved interstitial edema. Pulmonary interstitial prominence is favored to be related to AP portable technique. Similar bibasilar atelectasis.  IMPRESSION: Endotracheal tube not visualized and has presumably been removed. Cardiomegaly with mild bibasilar atelectasis. Electronically Signed   By: Abigail Miyamoto M.D.   On: 07/03/2015 07:54   Dg Chest Port 1 View  07/02/2015  CLINICAL DATA:  Endotracheal tube EXAM: PORTABLE CHEST 1 VIEW COMPARISON:  None. FINDINGS: Endotracheal 9 cm from carina. Consider advancement by 2-3 cm. Stable enlarged cardiac silhouette. There is central venous congestion similar prior. No pneumothorax. IMPRESSION: Endotracheal tube 9 cm from carina.  Consider advancement by 2-3 cm. Central venous congestion Electronically Signed   By: Suzy Bouchard M.D.   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  Extubated 2/08 >> concern for ability to protect airway and palliative care/comfort care discussed with family.  Respiratory status since improved.    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 Plan: - per neurology  Acute respiratory failure with compromised airway. Plan: - DNR/DNI  Mitral valve ndocarditis. Plan: - Abx per primary team and ID  Lt lung mass. Plan: - will need further assessment as outpt when other medical issues more  stable  Dysphagia. Plan: - f/u with speech therapy  Updated pt's family at bedside.  D/w Dr. Leonie Man.  PCCM will sign off.  Please call if additional help needed.  Chesley Mires, MD [facility] Pulmonary/Critical Care 07/03/2015, 9:56 AM Pager:  [phone] After 3pm call: [phone]

## 2015-07-03 NOTE — Evaluation (Signed)
Clinical/Bedside Swallow Evaluation Patient Details  Name: Becky Patterson MRN: [id number] Date of Birth: 02/15/1956  Today's Date: 07/03/2015 Time: SLP Start Time (ACUTE ONLY): 0920 SLP Stop Time (ACUTE ONLY): 0933 SLP Time Calculation (min) (ACUTE ONLY): 13 min  Past Medical History: History reviewed. No pertinent past medical history. Past Surgical History:  Past Surgical History  Procedure Laterality Date  . Back surgery    . Tee without cardioversion N/A 06/24/2015    Procedure: TRANSESOPHAGEAL ECHOCARDIOGRAM (TEE);  Surgeon: Larey Dresser, MD;  Location: [place];  Service: Cardiovascular;  Laterality: N/A;  . Radiology with anesthesia N/A 06/29/2015    Procedure: RADIOLOGY WITH ANESTHESIA;  Surgeon: Luanne Bras, MD;  Location: [place];  Service: Radiology;  Laterality: N/A;   HPI:  Becky Patterson is a 60 y.o. female history of infective endocarditis that was treated with discharge on 2/2, continuing on antibiotics at home, lung mass noted on that admission. Re-admitted with left sided weakness that progressed after admission. MRI: Large acute right middle cerebral artery distribution infarct   Assessment / Plan / Recommendation Clinical Impression  Bedside swallow evaluation complete. Patient presents with evidence of a mild-moderate oral dysphagia characterized by CN VII and XII impairment mildly impacting oral transit of bolus, and subtle s/s of aspiration characterized by throat clear post swallow with thin liquids. Given extent of CVA and prolonged intubation, cannot r/o silelnt aspiration at bedside. Recommend proceeding with MBS to instrumentally evaluate function.     Aspiration Risk       Diet Recommendation NPO   Medication Administration: Via alternative means    Other  Recommendations Oral Care Recommendations: Oral care QID             Prognosis Prognosis for Safe Diet Advancement: Good      Swallow Study   General HPI: Becky Patterson is a 59 y.o.  female history of infective endocarditis that was treated with discharge on 2/2, continuing on antibiotics at home, lung mass noted on that admission. Re-admitted with left sided weakness that progressed after admission. MRI: Large acute right middle cerebral artery distribution infarct Type of Study: Bedside Swallow Evaluation Previous Swallow Assessment: none Diet Prior to this Study: NPO Temperature Spikes Noted: No Respiratory Status: Nasal cannula History of Recent Intubation: Yes Length of Intubations (days): 3 days Date extubated: 07/02/15 Behavior/Cognition: Alert;Cooperative;Pleasant mood Oral Cavity Assessment: Within Functional Limits Oral Care Completed by SLP: No Oral Cavity - Dentition: Adequate natural dentition Vision: Functional for self-feeding Self-Feeding Abilities: Able to feed self Patient Positioning: Upright in bed Baseline Vocal Quality: Hoarse Volitional Cough: Strong Volitional Swallow: Able to elicit    Oral/Motor/Sensory Function Overall Oral Motor/Sensory Function: Moderate impairment Facial ROM: Reduced left;Suspected CN VII (facial) dysfunction Facial Symmetry: Abnormal symmetry left;Suspected CN VII (facial) dysfunction Facial Strength: Reduced left;Suspected CN VII (facial) dysfunction Facial Sensation: Within Functional Limits Lingual ROM: Reduced left;Suspected CN XII (hypoglossal) dysfunction Lingual Symmetry: Abnormal symmetry left;Suspected CN XII (hypoglossal) dysfunction Lingual Strength: Reduced;Suspected CN XII (hypoglossal) dysfunction Lingual Sensation: Within Functional Limits Velum: Within Functional Limits Mandible: Within Functional Limits   Ice Chips Ice chips: Within functional limits Presentation: Spoon   Thin Liquid Thin Liquid: Impaired Presentation: Cup;Self Fed Pharyngeal  Phase Impairments: Suspected delayed Swallow;Throat Clearing - Delayed    Nectar Thick Nectar Thick Liquid: Not tested   Honey Thick Honey Thick Liquid:  Not tested   Puree Puree: Within functional limits Presentation: Spoon;Self Fed   Solid   GO   Solid: Impaired  Presentation: Self Fed Oral Phase Impairments: Impaired mastication Oral Phase Functional Implications: Prolonged oral transit       Becky Rainwater MA, CCC-SLP [phone]  Becky Patterson Becky Patterson 07/03/2015,2:35 PM

## 2015-07-03 NOTE — Consult Note (Signed)
Physical Medicine and Rehabilitation Consult  Reason for Consult: Left sided weakness with sensory deficits, left neglect, dysphagia. Referring Physician: Dr. Leonie Man.    HPI: Becky Patterson is a 60 y.o. 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  She was admitted on 06/29/15 with acute onset of left sided weakness with slurred speech and left neglect. CT head negative and she underwent CTA brain showed M1 occlusion and Dr. Estanislado Pandy attempted revascularization of the right MCA occlusion with Solitare and IA Integrelin. She required fluid boluses due to hypotension. She developed diffuse rash question due reaction from transfusion v/s antibiotics. Dr. Julio Alm consulted for input and recommended switching to carbapenem and repeat TTE.  Follow up CCT with large posterior inferior right MCA infarct with evolution and no hemorrhage. She was weaned off vent on 02/08 and therapy evaluations done today. Patient with resultant left neglect with right gaze preference, dense left hemiparesis with sensory deficits, cognitive deficits and dysphagia. CIR recommended for follow up therapy.  In terms of her oncology follow-up she will be seeing Dr. Jana Hakim and has a PET scan scheduled 07/25/2015.  Patient has been interactive with nursing staff. Discussed with RN. Patient does mouth words. She follows simple commands. Obvious right gaze preference  Review of Systems  Unable to perform ROS: other  Constitutional: Positive for malaise/fatigue.  Eyes: Positive for double vision.  Respiratory: Negative for shortness of breath.   Cardiovascular: Negative for chest pain.  Gastrointestinal: Negative for abdominal pain.  Musculoskeletal: Positive for myalgias, back pain (back injury 30 years ago) and joint pain (chronic knee pain).  Skin: Negative for rash.  Neurological: Positive for sensory change,  speech change and focal weakness. Negative for headaches.      History reviewed. No pertinent past medical history.    Past Surgical History  Procedure Laterality Date  . Back surgery    . Tee without cardioversion N/A 06/24/2015    Procedure: TRANSESOPHAGEAL ECHOCARDIOGRAM (TEE);  Surgeon: Larey Dresser, MD;  Location: [facility];  Service: Cardiovascular;  Laterality: N/A;  . Radiology with anesthesia N/A 06/29/2015    Procedure: RADIOLOGY WITH ANESTHESIA;  Surgeon: Luanne Bras, MD;  Location: [facility];  Service: Radiology;  Laterality: N/A;    Family History  Problem Relation Age of Onset  . Lung cancer Mother   . Liver cancer Brother   . Heart attack Brother     Social History:  Married.  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 Per reports  that she has been smoking  3 PPD.  She does not have any smokeless tobacco history on file. She reports that she does not drink alcohol or use illicit drugs.    Allergies  Allergen Reactions  . Codeine Nausea And Vomiting    Medications Prior to Admission  Medication Sig Dispense Refill  . acetaminophen (TYLENOL) 500 MG tablet Take 1,000 mg by mouth 2 (two) times daily as needed for mild pain or headache.    . ferrous sulfate 325 (65 FE) MG tablet Take 1 tablet (325 mg total) by mouth 2 (two) times daily with a meal. 30 tablet 3  . furosemide (LASIX) 40 MG tablet Take 1 tablet (40 mg total) by mouth daily. 30 tablet 0  . Hydrocodone-Acetaminophen 5-300 MG TABS Take 1 tablet by mouth every 6 (six) hours as needed (for pain). Reported on 06/15/2015  0  .  naproxen sodium (ANAPROX) 220 MG tablet Take 440 mg by mouth 2 (two) times daily as needed (for pain).    . promethazine (PHENERGAN) 6.25 MG/5ML syrup Take 10 mLs (12.5 mg total) by mouth every 6 (six) hours as needed for nausea or vomiting. 120 mL 0  . traMADol (ULTRAM) 50 MG tablet Take 1-2 tablets (50-100 mg total) by mouth every 6 (six) hours as needed for moderate pain.  30 tablet 0  . [DISCONTINUED] ciprofloxacin (CIPRO) 750 MG tablet Take 1 tablet (750 mg total) by mouth 2 (two) times daily. 70 tablet 0    Home: Home Living Family/patient expects to be discharged to:: Inpatient rehab Living Arrangements: Spouse/significant other Available Help at Discharge: Family, Available 24 hours/day Type of Home: House Home Equipment: None  Functional History: Prior Function Level of Independence: Independent Comments: Worked as Air traffic controller. Functional Status:  Mobility: Bed Mobility Overal bed mobility: Needs Assistance, +2 for physical assistance Bed Mobility: Supine to Sit Supine to sit: Mod assist, +2 for physical assistance, HOB elevated General bed mobility comments: pt does attempt to follow cues for coming to sitting EOB.  pt moves R LE off of bed and reached for PT with her R UE.  Cues and facilitation for movement of L LE and bringing trunk to sitting.   Transfers Overall transfer level: Needs assistance Equipment used: 2 person hand held assist Transfers: Sit to/from Stand, Stand Pivot Transfers Sit to Stand: Max assist, +2 physical assistance Stand pivot transfers: Total assist, +2 physical assistance General transfer comment: pt does follow directions with R UE and LE, but no active participation in L side.  Blocked L knee and utilized pad under hips to come to standing and complete pivot to recliner.        ADL:    Cognition: Cognition Overall Cognitive Status: Impaired/Different from baseline Arousal/Alertness: Awake/alert Orientation Level: Oriented to person, Oriented to place, Oriented to situation Attention: Sustained Sustained Attention: Impaired Sustained Attention Impairment: Verbal complex, Functional complex Memory:  (TBD) Awareness: Impaired Awareness Impairment: Intellectual impairment Problem Solving:  (TBD) Safety/Judgment: Impaired Cognition Arousal/Alertness: Lethargic Behavior During Therapy: Flat affect Overall  Cognitive Status: Impaired/Different from baseline Difficult to assess due to: Level of arousal, Impaired communication  Blood pressure 131/72, pulse 95, temperature 98.1 F (36.7 C), temperature source Oral, resp. rate 19, height '5\' 2"'$  (1.575 m), weight 92.3 kg (203 lb 7.8 oz), SpO2 93 %. Physical Exam  Constitutional: She is oriented to person, place, and time. She appears well-developed and well-nourished.  HENT:  Head: Normocephalic and atraumatic.  Eyes: Pupils are equal, round, and reactive to light.  Neck: Normal range of motion. Neck supple.  Cardiovascular: Normal rate and regular rhythm.   Respiratory: Effort normal and breath sounds normal. She has no wheezes.  GI: Soft. Bowel sounds are normal. She exhibits no distension. There is no tenderness.  Musculoskeletal: She exhibits edema (LUE).  Neurological: She is alert and oriented to person, place, and time.  Dysphonic--soft voice. Left inattention. Left hemiparesis with sensory deficits.  She was able to turn eyes to left field with max cues and kept head midline for exam. Reported double vision in right field and unable to see beyond a foot on the right? Able to follow simple commands without diffuclty  Skin: Skin is warm and dry.  Motor strength is 4/5 in the right deltoid, biceps, triceps, grip, 0/5 in the left deltoid, biceps, triceps, grip 4/5 in the right hip flexor and knee extensor and ankle dorsiflexor,  0 at the left hip flexor and extensor ankle dorsiflexor. Sensation reduced to pinch left upper extremity does have minimal withdrawal to pinch left lower extremity KUB every 2 the left, does have a left homonymous hemianopsia  Results for orders placed or performed during the hospital encounter of 06/29/15 (from the past 24 hour(s))  Glucose, capillary     Status: Abnormal   Collection Time: 07/02/15  3:47 PM  Result Value Ref Range   Glucose-Capillary 137 (H) 65 - 99 mg/dL  Sodium     Status: None   Collection Time:  07/02/15  6:47 PM  Result Value Ref Range   Sodium 143 135 - 145 mmol/L  Glucose, capillary     Status: Abnormal   Collection Time: 07/02/15  8:04 PM  Result Value Ref Range   Glucose-Capillary 122 (H) 65 - 99 mg/dL  Glucose, capillary     Status: Abnormal   Collection Time: 07/02/15 11:20 PM  Result Value Ref Range   Glucose-Capillary 116 (H) 65 - 99 mg/dL  Sodium     Status: None   Collection Time: 07/03/15 12:01 AM  Result Value Ref Range   Sodium 144 135 - 145 mmol/L  Glucose, capillary     Status: Abnormal   Collection Time: 07/03/15  3:11 AM  Result Value Ref Range   Glucose-Capillary 112 (H) 65 - 99 mg/dL  CBC     Status: Abnormal   Collection Time: 07/03/15  5:45 AM  Result Value Ref Range   WBC 12.5 (H) 4.0 - 10.5 K/uL   RBC 2.42 (L) 3.87 - 5.11 MIL/uL   Hemoglobin 7.3 (L) 12.0 - 15.0 g/dL   HCT 22.6 (L) 36.0 - 46.0 %   MCV 93.4 78.0 - 100.0 fL   MCH 30.2 26.0 - 34.0 pg   MCHC 32.3 30.0 - 36.0 g/dL   RDW 14.4 11.5 - 15.5 %   Platelets 355 150 - 400 K/uL  Basic metabolic panel     Status: Abnormal   Collection Time: 07/03/15  5:45 AM  Result Value Ref Range   Sodium 143 135 - 145 mmol/L   Potassium 3.4 (L) 3.5 - 5.1 mmol/L   Chloride 107 101 - 111 mmol/L   CO2 25 22 - 32 mmol/L   Glucose, Bld 127 (H) 65 - 99 mg/dL   BUN 11 6 - 20 mg/dL   Creatinine, Ser 0.76 0.44 - 1.00 mg/dL   Calcium 8.6 (L) 8.9 - 10.3 mg/dL   GFR calc non Af Amer >60 >60 mL/min   GFR calc Af Amer >60 >60 mL/min   Anion gap 11 5 - 15  Phosphorus     Status: None   Collection Time: 07/03/15  5:45 AM  Result Value Ref Range   Phosphorus 3.3 2.5 - 4.6 mg/dL  Magnesium     Status: None   Collection Time: 07/03/15  5:45 AM  Result Value Ref Range   Magnesium 1.8 1.7 - 2.4 mg/dL  Glucose, capillary     Status: Abnormal   Collection Time: 07/03/15  8:12 AM  Result Value Ref Range   Glucose-Capillary 114 (H) 65 - 99 mg/dL  Glucose, capillary     Status: Abnormal   Collection Time: 07/03/15  11:54 AM  Result Value Ref Range   Glucose-Capillary 120 (H) 65 - 99 mg/dL   Ct Head Wo Contrast  07/02/2015  CLINICAL DATA:  Follow-up examination for stroke. EXAM: CT HEAD WITHOUT CONTRAST TECHNIQUE: Contiguous axial images were obtained from  the base of the skull through the vertex without intravenous contrast. COMPARISON:  Prior CT from 06/29/2014 as well as prior MRI from 06/30/2014. FINDINGS: Extensive cytotoxic edema seen throughout the right MCA territory, consistent with large acute right MCA territory infarct. Again, there is involvement of the right frontal, right parietal, and right temporal lobes, with involvement of the basal ganglia. Overall, distribution is stable from recent MRI. No evidence for hemorrhagic transformation on this examination. Persistent hyperdensity within the right M1 segment and right MCA at the sylvian fissure likely representing thrombosis. There is localized edema with partial effacement of the right lateral ventricle, similar to prior MRI. Mild bowing of the septum pellucidum from right-to-left measures up to 4 mm. Right uncal herniation, similar to previous. Basilar cisterns minimally crowded at the level of the right uncus, but otherwise patent. No hydrocephalus or evidence for ventricular trapping. Cytotoxic edema within the anterior insular/subinsular region on the left also similar relative to previous MRI. No significant mass effect or evidence for hemorrhagic transformation. No new infarct.  No extra-axial fluid collection. No acute abnormality about the orbits. Scalp soft tissues within normal limits. Mild mucosal thickening within the ethmoidal air cells, maxillary sinuses, and sphenoid sinuses. Paranasal sinuses are otherwise clear. Trace opacity within the right mastoid air cells. Calvarium intact. IMPRESSION: 1. Continued interval evolution of large ischemic right MCA territory infarct, overall stable in size and distribution relative to recent MRI. No evidence  for hemorrhagic transformation. Localized mass effect with effacement of the right lateral ventricle, right uncal herniation, and 4 mm of right-to-left shift present. No hydrocephalus or evidence for ventricular trapping. 2. Stable size of smaller left anterior insular/subinsular ischemic infarct. No evidence for associated hemorrhage or mass effect. 3. No other acute intracranial process. Electronically Signed   By: Jeannine Boga M.D.   On: 07/02/2015 04:35   Mr Brain Wo Contrast  07/01/2015  CLINICAL DATA:  60 year old female with infective endocarditis on treatment with sudden onset of left-sided weakness. Post angiogram and endovascular treatment for right internal carotid artery terminus occlusion. Subsequent encounter. EXAM: MRI HEAD WITHOUT CONTRAST TECHNIQUE: Multiplanar, multiecho pulse sequences of the brain and surrounding structures were obtained without intravenous contrast. COMPARISON:  06/30/2015 head CT.  No comparison brain MR. FINDINGS: Exam is motion degraded. Large acute right middle cerebral artery distribution infarct involving portions the right temporal lobe, right frontal lobe, right parietal lobe, operculum region, right basal ganglia and right corona radiata with local mass effect upon the right lateral ventricle with slight bowing of the septum to the left by 2 mm. On gradient sequence no associated blood however, slight T1 hyperintensity within the right lenticular nucleus and subtle blood at this level therefore not excluded. Mild right uncal herniation Nonhemorrhagic acute anterior left subinsular/peri operculum infarct. Mild small vessel disease changes. No intracranial mass lesion noted on this unenhanced exam. Cerebellar tonsils slightly low lying but within the range of normal limits. Decreased signal intensity of bone marrow may be related to patient's habitus or anemia. At the level of the sella, major intracranial vascular structures are patent. Limited evaluation right  middle cerebral artery branches. IMPRESSION: Large acute right middle cerebral artery distribution infarct involving portions the right temporal lobe, right frontal lobe, right parietal lobe, operculum region, right basal ganglia and right corona radiata with local mass effect upon the right lateral ventricle with slight bowing of the septum to the left by 2 mm. On gradient sequence no associated blood however, slight T1 hyperintensity within the right  lenticular nucleus and subtle blood at this level therefore not excluded. Mild right uncal herniation. Nonhemorrhagic acute anterior left subinsular/peri operculum infarct. Electronically Signed   By: Genia Del M.D.   On: 07/01/2015 16:34   Dg Chest Port 1 View  07/03/2015  CLINICAL DATA:  Endotracheal tube. EXAM: PORTABLE CHEST 1 VIEW COMPARISON:  07/02/2015 FINDINGS: Endotracheal tube not visualized and has presumably been removed. A right-sided PICC line terminates at the mid to low SVC. Midline trachea. Cardiomegaly accentuated by AP portable technique. No pleural effusion or pneumothorax. Improved to resolved interstitial edema. Pulmonary interstitial prominence is favored to be related to AP portable technique. Similar bibasilar atelectasis. IMPRESSION: Endotracheal tube not visualized and has presumably been removed. Cardiomegaly with mild bibasilar atelectasis. Electronically Signed   By: Abigail Miyamoto M.D.   On: 07/03/2015 07:54   Dg Chest Port 1 View  07/02/2015  CLINICAL DATA:  Endotracheal tube EXAM: PORTABLE CHEST 1 VIEW COMPARISON:  None. FINDINGS: Endotracheal 9 cm from carina. Consider advancement by 2-3 cm. Stable enlarged cardiac silhouette. There is central venous congestion similar prior. No pneumothorax. IMPRESSION: Endotracheal tube 9 cm from carina.  Consider advancement by 2-3 cm. Central venous congestion Electronically Signed   By: Suzy Bouchard M.D.   On: 07/02/2015 07:43   Dg Swallowing Func-speech Pathology  07/03/2015   Objective Swallowing Evaluation: Type of Study: MBS-Modified Barium Swallow Study Patient Details Name: Becky Patterson MRN: [id number] Date of Birth: [birth year] Today's Date: 07/03/2015 Time: SLP Start Time (ACUTE ONLY): 1346-SLP Stop Time (ACUTE ONLY): 1357 SLP Time Calculation (min) (ACUTE ONLY): 11 min Past Medical History: No past medical history on file. Past Surgical History: Past Surgical History Procedure Laterality Date . Back surgery   . Tee without cardioversion N/A 06/24/2015   Procedure: TRANSESOPHAGEAL ECHOCARDIOGRAM (TEE);  Surgeon: Larey Dresser, MD;  Location: [facility];  Service: Cardiovascular;  Laterality: N/A; . Radiology with anesthesia N/A 06/29/2015   Procedure: RADIOLOGY WITH ANESTHESIA;  Surgeon: Luanne Bras, MD;  Location: [facility];  Service: Radiology;  Laterality: N/A; HPI: Becky Patterson is a 60 y.o. female history of infective endocarditis that was treated with discharge on 2/2, continuing on antibiotics at home, lung mass noted on that admission. Re-admitted with left sided weakness that progressed after admission. 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 Combination of above leads to silent aspiration of liquids thinner than honey thick. Patient able to protect airway with solids and honey thick liquids provided with mild vallecular residuals remaining post swallow due to base of tongue weakness, clearing with cued dry swallow. Recommend initiation of conservative diet with close SLP f/u for tolerance and potential to advance which is good with improved  alertness and neuro functioning.  Impact on safety and function Moderate aspiration risk   CHL IP TREATMENT RECOMMENDATION 07/03/2015 Treatment Recommendations Therapy as outlined in treatment plan below   Prognosis 07/03/2015 Prognosis for Safe Diet Advancement Good Barriers to Reach Goals -- Barriers/Prognosis Comment -- CHL IP DIET RECOMMENDATION 07/03/2015 SLP Diet Recommendations Dysphagia 3 (Mech soft) solids;Honey thick liquids Liquid Administration via Cup;No straw Medication Administration Crushed with puree Compensations Slow rate;Small sips/bites;Multiple dry swallows after each bite/sip;Lingual sweep  for clearance of pocketing Postural Changes Seated upright at 90 degrees   CHL IP OTHER RECOMMENDATIONS 07/03/2015 Recommended Consults -- Oral Care Recommendations Oral care BID Other Recommendations Order thickener from pharmacy;Prohibited food (jello, ice cream, thin soups);Remove water pitcher   CHL IP FOLLOW UP RECOMMENDATIONS 07/03/2015 Follow up Recommendations Inpatient Rehab   CHL IP FREQUENCY AND DURATION 07/03/2015 Speech Therapy Frequency (ACUTE ONLY) min 3x week Treatment Duration 2 weeks      CHL IP ORAL PHASE 07/03/2015 Oral Phase Impaired Oral - Pudding Teaspoon -- Oral - Pudding Cup -- Oral - Honey Teaspoon Lingual/palatal residue;Delayed oral transit;Decreased bolus cohesion Oral - Honey Cup Lingual/palatal residue;Delayed oral transit;Decreased bolus cohesion Oral - Nectar Teaspoon Lingual/palatal residue;Delayed oral transit;Decreased bolus cohesion Oral - Nectar Cup -- Oral - Nectar Straw -- Oral - Thin Teaspoon Lingual/palatal residue;Delayed oral transit;Decreased bolus cohesion Oral - Thin Cup -- Oral - Thin Straw -- Oral - Puree Lingual/palatal residue;Delayed oral transit Oral - Mech Soft Lingual/palatal residue;Delayed oral transit Oral - Regular -- Oral - Multi-Consistency -- Oral - Pill -- Oral Phase - Comment --  CHL IP PHARYNGEAL PHASE 07/03/2015 Pharyngeal Phase Impaired Pharyngeal- Pudding  Teaspoon -- Pharyngeal -- Pharyngeal- Pudding Cup -- Pharyngeal -- Pharyngeal- Honey Teaspoon Delayed swallow initiation-vallecula;Pharyngeal residue - valleculae;Reduced tongue base retraction Pharyngeal -- Pharyngeal- Honey Cup Delayed swallow initiation-vallecula;Pharyngeal residue - valleculae;Reduced tongue base retraction Pharyngeal -- Pharyngeal- Nectar Teaspoon Pharyngeal residue - valleculae;Reduced tongue base retraction;Delayed swallow initiation-pyriform sinuses;Penetration/Aspiration before swallow Pharyngeal Material enters airway, passes BELOW cords without attempt by patient to eject out (silent aspiration) Pharyngeal- Nectar Cup -- Pharyngeal -- Pharyngeal- Nectar Straw -- Pharyngeal -- Pharyngeal- Thin Teaspoon Pharyngeal residue - valleculae;Reduced tongue base retraction;Delayed swallow initiation-pyriform sinuses;Penetration/Aspiration before swallow Pharyngeal Material enters airway, passes BELOW cords without attempt by patient to eject out (silent aspiration) Pharyngeal- Thin Cup -- Pharyngeal -- Pharyngeal- Thin Straw -- Pharyngeal -- Pharyngeal- Puree Delayed swallow initiation-vallecula;Pharyngeal residue - valleculae;Reduced tongue base retraction Pharyngeal -- Pharyngeal- Mechanical Soft Delayed swallow initiation-vallecula;Pharyngeal residue - valleculae;Reduced tongue base retraction Pharyngeal -- Pharyngeal- Regular -- Pharyngeal -- Pharyngeal- Multi-consistency -- Pharyngeal -- Pharyngeal- Pill -- Pharyngeal -- Pharyngeal Comment --  CHL IP CERVICAL ESOPHAGEAL PHASE 07/03/2015 Cervical Esophageal Phase WFL Pudding Teaspoon -- Pudding Cup -- Honey Teaspoon -- Honey Cup -- Nectar Teaspoon -- Nectar Cup -- Nectar Straw -- Thin Teaspoon -- Thin Cup -- Thin Straw -- Puree -- Mechanical Soft -- Regular -- Multi-consistency -- Pill -- Cervical Esophageal Comment -- No flowsheet data found. Gabriel Rainwater MA, CCC-SLP [phone] Gabriel Rainwater Meryl 07/03/2015, 2:51 PM                Assessment/Plan: Diagnosis: Right MCA infarct with left hemiplegia, dysphasia, dysarthria, dysphagia, homonymous hemianopsia, hemisensory deficits 1. Does the need for close, 24 hr/day medical supervision in concert with the patient's rehab needs make it unreasonable for this patient to be served in a less intensive setting? Yes 2. Co-Morbidities requiring supervision/potential complications: History of lung mass, mitral valve endocarditis with sepsis 3. Due to bladder management, bowel management, safety, skin/wound care, disease management, medication administration, pain management and patient education, does the patient require 24 hr/day rehab nursing? Yes 4. Does the patient require coordinated care of a physician, rehab nurse, PT (1-2 hrs/day, 5 days/week), OT (1-2 hrs/day, 55 days/week) and SLP (0.5-1 hrs/day, 5 days/week) to address physical and functional deficits in the context of the above medical diagnosis(es)? Yes Addressing deficits in the following areas: balance, endurance, locomotion, strength, transferring, bowel/bladder  control, bathing, dressing, feeding, grooming, toileting, cognition, speech, language, swallowing and psychosocial support 5. Can the patient actively participate in an intensive therapy program of at least 3 hrs of therapy per day at least 5 days per week? Yes 6. The potential for patient to make measurable gains while on inpatient rehab is good 7. Anticipated functional outcomes upon discharge from inpatient rehab are min assist  with PT, min assist with OT, min assist with SLP. 8. Estimated rehab length of stay to reach the above functional goals is: 18-21 days 9. Does the patient have adequate social supports and living environment to accommodate these discharge functional goals? Potentially 10. Anticipated D/C setting: Home 11. Anticipated post D/C treatments: [facility] therapy 12. Overall Rehab/Functional Prognosis: good  RECOMMENDATIONS: This patient's  condition is appropriate for continued rehabilitative care in the following setting: CIR Patient has agreed to participate in recommended program. Potentially Note that insurance prior authorization may be required for reimbursement for recommended care.  Comment:     07/03/2015

## 2015-07-03 NOTE — Progress Notes (Signed)
MBSS complete. Full report located under chart review in imaging section.  Ryosuke Ericksen MA, CCC-SLP [phone]   

## 2015-07-04 ENCOUNTER — Encounter (HOSPITAL_COMMUNITY): Payer: Self-pay | Admitting: Physical Medicine and Rehabilitation

## 2015-07-04 LAB — CULTURE, BLOOD (ROUTINE X 2)
CULTURE: NO GROWTH
Culture: NO GROWTH

## 2015-07-04 LAB — GLUCOSE, CAPILLARY
GLUCOSE-CAPILLARY: 102 mg/dL — AB (ref 65–99)
GLUCOSE-CAPILLARY: 118 mg/dL — AB (ref 65–99)
GLUCOSE-CAPILLARY: 126 mg/dL — AB (ref 65–99)
GLUCOSE-CAPILLARY: 96 mg/dL (ref 65–99)
Glucose-Capillary: 110 mg/dL — ABNORMAL HIGH (ref 65–99)
Glucose-Capillary: 144 mg/dL — ABNORMAL HIGH (ref 65–99)

## 2015-07-04 LAB — BASIC METABOLIC PANEL
Anion gap: 9 (ref 5–15)
BUN: 11 mg/dL (ref 6–20)
CHLORIDE: 108 mmol/L (ref 101–111)
CO2: 25 mmol/L (ref 22–32)
Calcium: 7.8 mg/dL — ABNORMAL LOW (ref 8.9–10.3)
Creatinine, Ser: 0.64 mg/dL (ref 0.44–1.00)
GFR calc non Af Amer: >60 mL/min (ref >60)
Glucose, Bld: 110 mg/dL — ABNORMAL HIGH (ref 65–99)
POTASSIUM: 2.9 mmol/L — AB (ref 3.5–5.1)
SODIUM: 142 mmol/L (ref 135–145)

## 2015-07-04 MED ORDER — POTASSIUM CHLORIDE 10 MEQ/100ML IV SOLN
10.0000 meq | INTRAVENOUS | Status: AC
  Administered 2015-07-04 (×4): 10 meq via INTRAVENOUS
  Filled 2015-07-04 (×3): qty 100

## 2015-07-04 NOTE — H&P (Signed)
Physical Medicine and Rehabilitation Admission H&P    Chief Complaint  Patient presents with  . Left sided weakness with sensory deficits, left neglect, dysphagia    HPI:   Becky Patterson is a 60 y.o. 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 She was admitted on 06/29/15 with acute onset of left sided weakness with slurred speech and left neglect. UDS positive for THC. CT head negative and she underwent CTA brain showed M1 occlusion and Dr. Estanislado Pandy attempted revascularization of the right MCA occlusion with Solitare and IA Integrelin. She required fluid boluses due to hypotension. She developed diffuse rash question due reaction from transfusion v/s antibiotics. Dr. Julio Alm consulted for input and recommended switching to carbapenem and repeat TTE at end of course of antibiotics. Follow up CCT with large posterior inferior right MCA infarct with evolution and no hemorrhage. MRI brain showed large R-MCA infarct with local mass effect on right lateral ventricle and mild right uncal herniation as well as non hemorrhagic left subinsular/ perioperculum infarct. Dr Leonie Man feels that infarct due to septic emboli and no antithrombotic due to risk of bleeding. She was weaned off vent on 02/08 and MBS done  for swallow evaluation. Se was started on dysphagia 3, honey liquids due to moderate oropharyngeal dysphagia with aspiration of liquids thinner than honey. Maculopapular Rash has resolved and she was switched back to Ceftriaxone. Patient with resultant left neglect with right gaze preference, dense left hemiparesis with sensory deficits, cognitive deficits and dysphagia. CIR recommended for follow up therapy   Review of Systems  HENT: Negative for hearing loss.   Eyes: Negative for blurred vision and double vision.  Respiratory: Negative for cough and shortness of breath.   Cardiovascular:  Negative for chest pain and palpitations.  Gastrointestinal: Negative for heartburn, nausea and abdominal pain.  Musculoskeletal: Positive for neck pain. Negative for myalgias and back pain.  Neurological: Positive for sensory change, weakness and headaches (all the time). Negative for dizziness and tingling.  Psychiatric/Behavioral: Positive for memory loss. The patient does not have insomnia.   All other systems reviewed and are negative.     Past Medical History  Diagnosis Date  . Lung mass   . Bacterial endocarditis   . Splenic infarction   . Epistaxis     Past Surgical History  Procedure Laterality Date  . Back surgery    . Tee without cardioversion N/A 06/24/2015    Procedure: TRANSESOPHAGEAL ECHOCARDIOGRAM (TEE);  Surgeon: Larey Dresser, MD;  Location: [place];  Service: Cardiovascular;  Laterality: N/A;  . Radiology with anesthesia N/A 06/29/2015    Procedure: RADIOLOGY WITH ANESTHESIA;  Surgeon: Luanne Bras, MD;  Location: [place];  Service: Radiology;  Laterality: N/A;  . Cholecystectomy      Family History  Problem Relation Age of Onset  . Lung cancer Mother   . Liver cancer Brother   . Heart attack Brother     Social History:  Married. Self employed Air traffic controller.  Husband is a Administrator.  Family reports that she smokes 3 PPD.  Per reports she does not use alcohol or illicit drugs.    Allergies  Allergen Reactions  . Codeine Nausea And Vomiting    Medications Prior to Admission  Medication Sig Dispense Refill  . acetaminophen (TYLENOL) 500 MG tablet Take 1,000 mg by mouth 2 (two) times daily as needed for mild pain or headache.    Marland Kitchen  ferrous sulfate 325 (65 FE) MG tablet Take 1 tablet (325 mg total) by mouth 2 (two) times daily with a meal. 30 tablet 3  . furosemide (LASIX) 40 MG tablet Take 1 tablet (40 mg total) by mouth daily. 30 tablet 0  . Hydrocodone-Acetaminophen 5-300 MG TABS Take 1 tablet by mouth every 6 (six) hours as needed (for pain).  Reported on 06/15/2015  0  . naproxen sodium (ANAPROX) 220 MG tablet Take 440 mg by mouth 2 (two) times daily as needed (for pain).    . promethazine (PHENERGAN) 6.25 MG/5ML syrup Take 10 mLs (12.5 mg total) by mouth every 6 (six) hours as needed for nausea or vomiting. 120 mL 0  . traMADol (ULTRAM) 50 MG tablet Take 1-2 tablets (50-100 mg total) by mouth every 6 (six) hours as needed for moderate pain. 30 tablet 0  . [DISCONTINUED] ciprofloxacin (CIPRO) 750 MG tablet Take 1 tablet (750 mg total) by mouth 2 (two) times daily. 70 tablet 0    Home: Home Living Family/patient expects to be discharged to:: Inpatient rehab Living Arrangements: Spouse/significant other Available Help at Discharge: Family, Available 24 hours/day Type of Home: House Home Equipment: None Additional Comments: Per family pt was very independent PTA    Functional History: Prior Function Level of Independence: Independent Comments: Worked as Air traffic controller.  Functional Status:  Mobility: Bed Mobility Overal bed mobility: Needs Assistance, +2 for physical assistance Bed Mobility: Sit to Supine Supine to sit: Max assist Sit to supine: Max assist General bed mobility comments: Pt able to assist with moving Rt LE off bed and with lifting trunk.  She did assist with lowering trunk to bed and minimally to lift LEs onto the bed  Transfers Overall transfer level: Needs assistance Equipment used: 2 person hand held assist Transfers: Sit to/from Stand, Stand Pivot Transfers Sit to Stand: Max assist, +2 physical assistance Stand pivot transfers: Total assist, +2 physical assistance General transfer comment: did not attempt       ADL: ADL Overall ADL's : Needs assistance/impaired Eating/Feeding: Minimal assistance, Sitting Eating/Feeding Details (indicate cue type and reason): see with SLP.  Pt fed self spoonfuls of honey thick liquids with assist to stabilize the cup.  She is able to drink from cup with supervision    Grooming: Wash/dry hands, Wash/dry face, Moderate assistance, Sitting Upper Body Bathing: Maximal assistance, Sitting Lower Body Bathing: Total assistance, Sitting/lateral leans, Bed level Upper Body Dressing : Maximal assistance, Sitting Lower Body Dressing: Total assistance, Sitting/lateral leans, Bed level Toilet Transfer: Total assistance, Squat-pivot Toileting- Clothing Manipulation and Hygiene: Total assistance, Sitting/lateral lean, Bed level Functional mobility during ADLs: Total assistance General ADL Comments: spouse present during session and is very supportive of pt.   Cognition: Cognition Overall Cognitive Status: Impaired/Different from baseline Arousal/Alertness: Awake/alert Orientation Level: Oriented X4 Attention: Sustained Sustained Attention: Impaired Sustained Attention Impairment: Verbal complex, Functional complex Memory:  (TBD) Awareness: Impaired Awareness Impairment: Intellectual impairment Problem Solving:  (TBD) Safety/Judgment: Impaired Cognition Arousal/Alertness: Awake/alert Behavior During Therapy: Flat affect Overall Cognitive Status: Impaired/Different from baseline Area of Impairment: Attention, Following commands, Safety/judgement, Awareness, Problem solving Current Attention Level: Sustained, Selective Following Commands: Follows one step commands consistently Safety/Judgement: Decreased awareness of deficits, Decreased awareness of safety Awareness: Intellectual Problem Solving: Decreased initiation, Difficulty sequencing, Requires verbal cues, Requires tactile cues, Slow processing General Comments: Pt demonstrates sustained and at times selective attention.  She becomes distracted by items on her Rt or with concern about spouse's location, but is redirected back to  task with min cues.  She is able to tell therapist the events of the day.  She is aware she has had a stroke, but has poor awareness of her deficits and their impact on her  functionally  Difficult to assess due to: Level of arousal, Impaired communication   Blood pressure 121/56, pulse 99, temperature 98.6 F (37 C), temperature source Oral, resp. rate 18, height '5\' 2"'  (1.575 m), weight 92.08 kg (203 lb), SpO2 94 %. Physical Exam  Nursing note and vitals reviewed. Constitutional: She is oriented to person, place, and time. She appears well-developed and well-nourished.  HENT:  Head: Normocephalic and atraumatic.  Mouth/Throat: Oropharynx is clear and moist.  Eyes: Conjunctivae are normal. Pupils are equal, round, and reactive to light. No scleral icterus.  Neck: Normal range of motion. Neck supple.  Cardiovascular: Normal rate and regular rhythm.   Respiratory: Effort normal and breath sounds normal.  GI: Soft. Bowel sounds are normal. There is tenderness.  Musculoskeletal: She exhibits edema. She exhibits no tenderness.  Neurological: She is alert and oriented to person, place, and time.  Dysarthric speech with left facial weakness and left sided tongue deviation.  Right gaze preference and unable to move eyes beyond mild line.  Motor: LUE/LLE: 0/5 R UE/RLE 5/5 proximal distal LLE: Clonus DTRs symmetric Sensation diminished light touch le upper and left lower extremity  Skin: Skin is warm and dry.  Psychiatric: Her affect is angry and blunt. She expresses inappropriate judgment.   Results for orders placed or performed during the hospital encounter of 06/29/15 (from the past 48 hour(s))  Glucose, capillary     Status: Abnormal   Collection Time: 07/05/15 11:28 AM  Result Value Ref Range   Glucose-Capillary 149 (H) 65 - 99 mg/dL  Glucose, capillary     Status: Abnormal   Collection Time: 07/05/15  4:21 PM  Result Value Ref Range   Glucose-Capillary 155 (H) 65 - 99 mg/dL  Glucose, capillary     Status: Abnormal   Collection Time: 07/05/15  8:16 PM  Result Value Ref Range   Glucose-Capillary 141 (H) 65 - 99 mg/dL  Basic metabolic panel      Status: Abnormal   Collection Time: 07/06/15  2:15 AM  Result Value Ref Range   Sodium 138 135 - 145 mmol/L   Potassium 3.7 3.5 - 5.1 mmol/L   Chloride 103 101 - 111 mmol/L   CO2 26 22 - 32 mmol/L   Glucose, Bld 115 (H) 65 - 99 mg/dL   BUN 8 6 - 20 mg/dL   Creatinine, Ser 0.75 0.44 - 1.00 mg/dL   Calcium 8.4 (L) 8.9 - 10.3 mg/dL   GFR calc non Af Amer >60 >60 mL/min   GFR calc Af Amer >60 >60 mL/min    Comment: (NOTE) The eGFR has been calculated using the CKD EPI equation. This calculation has not been validated in all clinical situations. eGFR's persistently <60 mL/min signify possible Chronic Kidney Disease.    Anion gap 9 5 - 15  CBC     Status: Abnormal   Collection Time: 07/06/15  2:15 AM  Result Value Ref Range   WBC 9.7 4.0 - 10.5 K/uL   RBC 2.74 (L) 3.87 - 5.11 MIL/uL   Hemoglobin 7.9 (L) 12.0 - 15.0 g/dL   HCT 25.2 (L) 36.0 - 46.0 %   MCV 92.0 78.0 - 100.0 fL   MCH 28.8 26.0 - 34.0 pg   MCHC 31.3 30.0 - 36.0 g/dL  RDW 14.7 11.5 - 15.5 %   Platelets 363 150 - 400 K/uL  Glucose, capillary     Status: Abnormal   Collection Time: 07/06/15  6:34 AM  Result Value Ref Range   Glucose-Capillary 131 (H) 65 - 99 mg/dL   Comment 1 Notify RN    Comment 2 Document in Chart   Glucose, capillary     Status: Abnormal   Collection Time: 07/06/15 11:31 AM  Result Value Ref Range   Glucose-Capillary 126 (H) 65 - 99 mg/dL  Glucose, capillary     Status: Abnormal   Collection Time: 07/06/15  4:30 PM  Result Value Ref Range   Glucose-Capillary 142 (H) 65 - 99 mg/dL  Glucose, capillary     Status: Abnormal   Collection Time: 07/07/15 12:03 AM  Result Value Ref Range   Glucose-Capillary 156 (H) 65 - 99 mg/dL   Comment 1 Notify RN    Comment 2 Document in Chart   Basic metabolic panel     Status: Abnormal   Collection Time: 07/07/15  5:38 AM  Result Value Ref Range   Sodium 135 135 - 145 mmol/L   Potassium 3.4 (L) 3.5 - 5.1 mmol/L   Chloride 103 101 - 111 mmol/L   CO2 25  22 - 32 mmol/L   Glucose, Bld 125 (H) 65 - 99 mg/dL   BUN 5 (L) 6 - 20 mg/dL   Creatinine, Ser 0.77 0.44 - 1.00 mg/dL   Calcium 8.4 (L) 8.9 - 10.3 mg/dL   GFR calc non Af Amer >60 >60 mL/min   GFR calc Af Amer >60 >60 mL/min    Comment: (NOTE) The eGFR has been calculated using the CKD EPI equation. This calculation has not been validated in all clinical situations. eGFR's persistently <60 mL/min signify possible Chronic Kidney Disease.    Anion gap 7 5 - 15  CBC     Status: Abnormal   Collection Time: 07/07/15  5:38 AM  Result Value Ref Range   WBC 9.4 4.0 - 10.5 K/uL   RBC 2.70 (L) 3.87 - 5.11 MIL/uL   Hemoglobin 8.0 (L) 12.0 - 15.0 g/dL   HCT 25.1 (L) 36.0 - 46.0 %   MCV 93.0 78.0 - 100.0 fL   MCH 29.6 26.0 - 34.0 pg   MCHC 31.9 30.0 - 36.0 g/dL   RDW 15.0 11.5 - 15.5 %   Platelets 362 150 - 400 K/uL  Glucose, capillary     Status: Abnormal   Collection Time: 07/07/15  8:42 AM  Result Value Ref Range   Glucose-Capillary 136 (H) 65 - 99 mg/dL   Dg Chest Port 1 View  07/03/2015  CLINICAL DATA:  Endotracheal tube. EXAM: PORTABLE CHEST 1 VIEW COMPARISON:  07/02/2015 FINDINGS: Endotracheal tube not visualized and has presumably been removed. A right-sided PICC line terminates at the mid to low SVC. Midline trachea. Cardiomegaly accentuated by AP portable technique. No pleural effusion or pneumothorax. Improved to resolved interstitial edema. Pulmonary interstitial prominence is favored to be related to AP portable technique. Similar bibasilar atelectasis. IMPRESSION: Endotracheal tube not visualized and has presumably been removed. Cardiomegaly with mild bibasilar atelectasis. Electronically Signed   By: Abigail Miyamoto M.D.   On: 07/03/2015 07:54    Medical Problem List and Plan: 1.  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 No antithrombotic needed.  2.  DVT Prophylaxis/Anticoagulation: Pharmaceutical: Lovenox 3. Pain  Management: Has chronic back pain per family. Will  use tylenol prn for now.  4. Mood: LCSW to follow for evaluation and support.  5. Neuropsych: This patient is not fully capable of making decisions on her own behalf. 6. Skin/Wound Care: routine pressure relief measures.  7. Fluids/Electrolytes/Nutrition: Monitor I/O. Getting dehydrated with honey liquids. Will need IVF at nights and need to encourage fluid intake during the day. Check lytes in am.  8. 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 PET scan on outpatient basis. 10. Hypokalemia: K+ 3.4 this am. Check labs this pm and again in am 11. Cytotoxic edema: Weaned of hypertonic solution.   12. Maculopapular rash:  Likely due to antibiotics per ID and itching being managed with prn benadryl. Antibiotics narrowed to Ceftriaxone on 02/11--monitor for now. IF rash recurs will need to change back to imipenem.    Antibiotic D # 24--End date March 7th.  13. Anemia: Hgb slowly improving. Up to 8 today.  14. Hypotension: Cont to monitor 15. Headache: Will start topamax  Post Admission Physician Evaluation: 1. Functional deficits secondary  to right MCA infarct. 2. Patient is admitted to receive collaborative, interdisciplinary care between the physiatrist, rehab nursing staff, and therapy team. 3. Patient's level of medical complexity and substantial therapy needs in context of that medical necessity cannot be provided at a lesser intensity of care such as a SNF. 4. Patient has experienced substantial functional loss from his/her baseline which was documented above under the "Functional History" and "Functional Status" headings.  Judging by the patient's diagnosis, physical exam, and functional history, the patient has potential for functional progress which will result in measurable gains while on inpatient rehab.  These gains will be of substantial and practical use  upon discharge  in facilitating mobility and self-care at the household level. 5. Physiatrist will provide 24 hour management of medical needs as well as oversight of the therapy plan/treatment and provide guidance as appropriate regarding the interaction of the two. 6. 24 hour rehab nursing will assist with bladder management, bowel management, safety, skin/wound care, disease management, medication administration, pain management and patient education and help integrate therapy concepts, techniques,education, etc. 7. PT will assess and treat for/with: Lower extremity strength, range of motion, stamina, balance, functional mobility, safety, adaptive techniques and equipment, coping skills, pain control, stroke education.   Goals are: Min A. 8. OT will assess and treat for/with: ADL's, functional mobility, safety, upper extremity strength, adaptive techniques and equipment, ego support, and community reintegration.   Goals are: Min A. Therapy may proceed with showering this patient. 9. SLP will assess and treat for/with: swallowing and higher level cognition.  Goals are: Mod I/Ind. 10. Case Management and Social Worker will assess and treat for psychological issues and discharge planning. 11. Team conference will be held weekly to assess progress toward goals and to determine barriers to discharge. 12. Patient will receive at least 3 hours of therapy per day at least 5 days per week. 13. ELOS: 19-21 days.       14. Prognosis:  good  Delice Lesch, MD 07/07/2015

## 2015-07-04 NOTE — Progress Notes (Signed)
Occupational Therapy Evaluation (late entry)  Pt presents to OT with Lt neglect, Lt hemiplegia, impaired cognition, impaired balance.  She requires max - total A for ADLs at this time.  She has good family support.  Feel she would benefit from continued OT to maximize safety and independence with ADLs.  Recommend CIR.   Recommend CIR.  DME needs to be assessed in next venue    07/03/15 1443  OT Visit Information  Last OT Received On 07/04/15  Assistance Needed +2  History of Present Illness This 60 y.o. female admitted with acute onset Lt sided weakness, slurred speech and Lt neglect.  CTA showed M1 occlusion.   Revascularization of Rt MCA attempted.  She was weaned off vent 07/02/15.  PMH includes:  recent hospitalization for sepsis,  MV endocarditis with splenic infarcts (discharged 06/26/15); lung mass   Precautions  Precautions Fall  Precaution Comments Lt neglect   Home Living  Family/patient expects to be discharged to: Inpatient rehab  Living Arrangements Spouse/significant other  Available Help at Discharge Family;Available 24 hours/day  Type of Home House  Home Equipment None  Additional Comments Per family pt was very independent PTA   Prior Function  Level of Independence Independent  Comments Worked as Air traffic controller.  Communication  Communication (Pt with limited communication )  Pain Assessment  Pain Assessment No/denies pain  Cognition  Arousal/Alertness Lethargic;Awake/alert  Behavior During Therapy Flat affect  Overall Cognitive Status Impaired/Different from baseline  Area of Impairment Attention;Following commands;Safety/judgement;Awareness;Problem solving  Current Attention Level Sustained  Following Commands Follows one step commands inconsistently;Follows one step commands with increased time  Safety/Judgement Decreased awareness of safety;Decreased awareness of deficits  Awareness Intellectual  Problem Solving Slow processing;Decreased initiation;Difficulty  sequencing;Requires verbal cues;Requires tactile cues  General Comments Pt was oriented x 4.  She is slow to respond.    Upper Extremity Assessment  Upper Extremity Assessment LUE deficits/detail  LUE Deficits / Details edema noted Lt UE.  Pt does not move Lt UE to command, but spontaneously flexed elbow ~40* with no awareness of doing so.  Spouse reports she has done this several times   LUE Coordination decreased fine motor;decreased gross motor  Lower Extremity Assessment  Lower Extremity Assessment Defer to PT evaluation  Cervical / Trunk Assessment  Cervical / Trunk Assessment Other exceptions  Cervical / Trunk Exceptions Pt with decreased trunk activation Lt trunk  ADL  Overall ADL's  Needs assistance/impaired  Eating/Feeding NPO  Grooming Wash/dry hands;Wash/dry face;Moderate assistance;Sitting  Upper Body Bathing Maximal assistance;Sitting  Lower Body Bathing Total assistance;Sitting/lateral leans;Bed level  Upper Body Dressing  Maximal assistance;Sitting  Lower Body Dressing Total assistance;Sitting/lateral leans;Bed level  Toilet Transfer Total assistance;Squat-pivot  Toileting- Clothing Manipulation and Hygiene Total assistance;Sitting/lateral lean;Bed level  Functional mobility during ADLs Total assistance  General ADL Comments Pt very engaged with OT, does fatigue as she has had multiple therapies in room this am.    Vision- Assessment  Additional Comments Pt with difficulty engaging in formal visual assessment.  She has Rt gaze prefernece.  She will track to and locate items in far Lt visual field with max stimuli and encouragement   Perception  Perception Tested? Yes  Perception Deficits Inattention/neglect  Inattention/Neglect Does not attend to left visual field;Does not attend to left side of body  Praxis  Praxis tested? Deficits  Deficits Ideation  Praxis-Other Comments possible motor planning deficits   Bed Mobility  General bed mobility comments sitting in chair    Transfers  General  transfer comment Unable to safely attempt with +1 assist   Balance  Overall balance assessment Needs assistance  Sitting-balance support Feet supported  Sitting balance-Leahy Scale Poor  Postural control Posterior lean;Left lateral lean  General Comments  General comments (skin integrity, edema, etc.) family present and very supportive.  VSS   OT - End of Session  Equipment Utilized During Treatment Oxygen  Activity Tolerance Patient tolerated treatment well  Patient left in chair;with call bell/phone within reach;with chair alarm set;with family/visitor present  Nurse Communication Mobility status  OT Assessment  OT Therapy Diagnosis  Generalized weakness;Cognitive deficits;Disturbance of vision;Hemiplegia non-dominant side  OT Recommendation/Assessment Patient needs continued OT Services  OT Problem List Decreased strength;Decreased range of motion;Impaired balance (sitting and/or standing);Decreased activity tolerance;Impaired vision/perception;Decreased coordination;Decreased cognition;Decreased safety awareness;Decreased knowledge of use of DME or AE;Impaired sensation;Impaired tone;Impaired UE functional use;Increased edema  OT Plan  OT Frequency (ACUTE ONLY) Min 3X/week  OT Treatment/Interventions (ACUTE ONLY) Self-care/ADL training;Therapeutic exercise;Neuromuscular education;DME and/or AE instruction;Energy conservation;Manual therapy;Therapeutic activities;Splinting;Cognitive remediation/compensation;Visual/perceptual remediation/compensation;Patient/family education;Balance training  OT Recommendation  Recommendations for Other Services Rehab consult  Follow Up Recommendations CIR;Supervision/Assistance - 24 hour  OT Equipment None recommended by OT  Individuals Consulted  Consulted and Agree with Results and Recommendations Patient;Family member/caregiver  Family Member Consulted to regain independence   Acute Rehab OT Goals  OT Goal Formulation With  patient/family  Time For Goal Achievement 07/18/15  Potential to Achieve Goals Good  OT Time Calculation  OT Start Time (ACUTE ONLY) 1042  OT Stop Time (ACUTE ONLY) 1105  OT Time Calculation (min) 23 min  OT General Charges  $OT Visit 1 Procedure  OT Evaluation  $OT Eval Moderate Complexity 1 Procedure  OT Treatments  $Therapeutic Activity 8-22 mins  Written Expression  Dominant Hand (Pt ambidextrous )  Lucille Passy, OTR/L [phone]

## 2015-07-04 NOTE — Progress Notes (Signed)
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  pt with no pertinent PMH.      PT Comments    Pt continues to attempt to follow directions well, but is requiring 2 person extensive A for all OOB mobility.  Continue to feel pt would benefit from CIR level of therapies at D/C.  Will continue to follow.    Follow Up Recommendations  CIR     Equipment Recommendations  None recommended by PT    Recommendations for Other Services Rehab consult     Precautions / Restrictions Precautions Precautions: Fall Precaution Comments: Lt neglect  Restrictions Weight Bearing Restrictions: No    Mobility  Bed Mobility Overal bed mobility: Needs Assistance;+2 for physical assistance Bed Mobility: Supine to Sit     Supine to sit: Mod assist;+2 for physical assistance;HOB elevated     General bed mobility comments: pt does well attempting to move R side, but neglectful of L side and needs total A for L side.    Transfers Overall transfer level: Needs assistance Equipment used: 2 person hand held assist Transfers: Sit to/from [facility] Sit to Stand: Max assist;+2 physical assistance Stand pivot transfers: Total assist;+2 physical assistance       General transfer comment: pt does attempt to follow all directions for transfer.  Needs L knee blocked and facilitation for weight shifting towards R side.  pt tends to sit prematurely.    Ambulation/Gait                 Stairs            Wheelchair Mobility    Modified Rankin (Stroke Patients Only) Modified Rankin (Stroke Patients Only) Pre-Morbid Rankin Score: No symptoms Modified Rankin: Severe disability     Balance Overall balance assessment: Needs assistance Sitting-balance support:  Feet supported;Single extremity supported Sitting balance-Leahy Scale: Poor     Standing balance support: During functional activity Standing balance-Leahy Scale: Poor                      Cognition Arousal/Alertness: Lethargic Behavior During Therapy: Flat affect Overall Cognitive Status: Impaired/Different from baseline Area of Impairment: Attention;Following commands;Safety/judgement;Awareness;Problem solving   Current Attention Level: Sustained   Following Commands: Follows one step commands inconsistently;Follows one step commands with increased time Safety/Judgement: Decreased awareness of safety;Decreased awareness of deficits Awareness: Intellectual Problem Solving: Slow processing;Decreased initiation;Difficulty sequencing;Requires verbal cues;Requires tactile cues      Exercises      General Comments        Pertinent Vitals/Pain Pain Assessment: No/denies pain    Home Living                      Prior Function            PT Goals (current goals can now be found in the care plan section) Acute Rehab PT Goals Patient Stated Goal: Per family to [facility] better PT Goal Formulation: With patient/family Time For Goal Achievement: 07/17/15 Potential to Achieve Goals: Good Progress towards PT goals: Progressing toward goals    Frequency  Min 4X/week    PT Plan Current plan remains appropriate    Co-evaluation             End of Session  Equipment Utilized During Treatment: Gait belt Activity Tolerance: Patient limited by fatigue Patient left: in chair;with call bell/phone within reach;with chair alarm set;with family/visitor present     Time: 0211-1735 PT Time Calculation (min) (ACUTE ONLY): 33 min  Charges:  $Therapeutic Activity: 23-37 mins                    G CodesCatarina Hartshorn, [place] 07/04/2015, 3:19 PM

## 2015-07-04 NOTE — Progress Notes (Signed)
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  pt with no pertinent PMH.     OT comments  Pt progressing nicely toward goals.  Co-treat with SLP.  Pt sat EOB x ~40 mins with mod A/facillitation.   She will locate items on the Lt with min cues and will spontaneously track into the Lt visual fields.  She is able to perform simple feeding task with min A.   Recommend CIR.   Follow Up Recommendations  CIR;Supervision/Assistance - 24 hour    Equipment Recommendations  None recommended by OT    Recommendations for Other Services Rehab consult    Precautions / Restrictions Precautions Precautions: Fall Precaution Comments: Lt neglect  Restrictions Weight Bearing Restrictions: No       Mobility Bed Mobility Overal bed mobility: Needs Assistance;+2 for physical assistance Bed Mobility: Sit to Supine     Supine to sit: Max assist Sit to supine: Max assist   General bed mobility comments: Pt able to assist with moving Rt LE off bed and with lifting trunk.  She did assist with lowering trunk to bed and minimally to lift LEs onto the bed   Transfers Overall transfer level: Needs assistance Equipment used: 2 person hand held assist Transfers: Sit to/from [facility] Sit to Stand: Max assist;+2 physical assistance Stand pivot transfers: Total assist;+2 physical assistance       General transfer comment: did not attempt     Balance Overall balance assessment: Needs assistance Sitting-balance support: Feet supported;Single extremity supported Sitting balance-Leahy Scale: Poor Sitting balance - Comments: Pt sat EOB x ~40 mins with mod A/facillitation and occasional periods of min A, until she fatigued.  At that time she required max A with increased  leaning to Lt and increased head/neck trunk flexion.  faciliation provided Lt rib cage and Lt scapular/shoulder area for trunk extension and activation of lt trunk  Postural control: Left lateral lean Standing balance support: During functional activity Standing balance-Leahy Scale: Poor                     ADL Overall ADL's : Needs assistance/impaired Eating/Feeding: Minimal assistance;Sitting Eating/Feeding Details (indicate cue type and reason): see with SLP.  Pt fed self spoonfuls of honey thick liquids with assist to stabilize the cup.  She is able to drink from cup with supervision                                    General ADL Comments: spouse present during session and is very supportive of pt.       Vision                 Additional Comments: Pt continues with Rt gaze preference, but she will spontaneously attempt to track to the Lt and requires min verbal cues to locate items on the Lt.    Perception     Praxis      Cognition   Behavior During Therapy: Flat affect Overall Cognitive Status: Impaired/Different from baseline Area of Impairment: Attention;Following commands;Safety/judgement;Awareness;Problem solving   Current Attention Level: Sustained;Selective    Following Commands: Follows one step commands consistently Safety/Judgement: Decreased awareness of deficits;Decreased awareness of safety Awareness: Intellectual  Problem Solving: Decreased initiation;Difficulty sequencing;Requires verbal cues;Requires tactile cues;Slow processing General Comments: Pt demonstrates sustained and at times selective attention.  She becomes distracted by items on her Rt or with concern about spouse's location, but is redirected back to task with min cues.  She is able to tell therapist the events of the day.  She is aware she has had a stroke, but has poor awareness of her deficits and their impact on her functionally     Extremity/Trunk Assessment                Exercises     Shoulder Instructions       General Comments      Pertinent Vitals/ Pain       Pain Assessment: No/denies pain  Home Living                                          Prior Functioning/Environment              Frequency Min 3X/week     Progress Toward Goals  OT Goals(current goals can now be found in the care plan section)  Progress towards OT goals: Progressing toward goals  Acute Rehab OT Goals Patient Stated Goal: Per family to [facility] better ADL Goals Pt Will Perform Eating: with supervision;sitting Pt Will Perform Grooming: with min assist;sitting Pt Will Perform Upper Body Bathing: with min assist;sitting Pt Will Perform Lower Body Bathing: with mod assist;sit to/from stand Pt Will Perform Upper Body Dressing: with mod assist;sitting Pt Will Perform Lower Body Dressing: with max assist;sit to/from stand Pt Will Transfer to Toilet: with mod assist;stand pivot transfer;bedside commode Pt/caregiver will Perform Home Exercise Program: Increased ROM;Left upper extremity;With written HEP provided;Independently (PROM ) Additional ADL Goal #1: Pt will locate ADL items on Lt with min cues   Plan Discharge plan remains appropriate    Co-evaluation      Reason for Co-Treatment: Necessary to address cognition/behavior during functional activity     SLP goals addressed during session: Swallowing;Cognition    End of Session     Activity Tolerance Patient tolerated treatment well   Patient Left in bed;with call bell/phone within reach;with bed alarm set;with family/visitor present   Nurse Communication Mobility status        Time: 8938-1017 OT Time Calculation (min): 40 min  Charges: OT General Charges $OT Visit: 1 Procedure OT Treatments $Neuromuscular Re-education: 23-37 mins  Johannes Everage M 07/04/2015, 6:51 PM

## 2015-07-04 NOTE — Progress Notes (Signed)
Patient arrived to 5M05 accompanied by family. Safety precautions and orders reviewed with patient/family. VSS. No other distress noted. Report to oncoming RN. Will continue to monitor.  Ave Filter, RN

## 2015-07-04 NOTE — Progress Notes (Signed)
Speech Language Pathology Treatment: Dysphagia;Cognitive-Linquistic  Patient Details Name: Becky Patterson MRN: [id number] DOB: [birth year] Today's Date: 07/04/2015 Time: 1410-3013 SLP Time Calculation (min) (ACUTE ONLY): 35 min  Assessment / Plan / Recommendation Clinical Impression  Participated in co-treatment with OT.  Pt with low volume, breathy phonation at baseline, but improved when sitting upright at EOB with improved respiratory effort, mod cues for increased volume.  Distracted by objects on right side, but able to redirect focus/attention with min verbal cues.  Able to turn head to midline and make eye contact with mod cues; emerging ability to attend to midline spontaneously. Able to recall clinicians' names at end of session; making needs known independently; affect remains flat.  Reviewed deficits associated with R CVA with pt and spouse and results of yesterday's MBS, which pt could not recall.  Pt fed self with cup/spoon with intermittent, spontaneous attention to left side of mouth to clean spills; otherwise, with min overall cues to decrease rate and attend to left.  Demonstrated good toleration of honey-thick liquids.  Pt sustained effort and attention to lengthy treatment session with excellent motivation.   Continue SLP for cognition/dysphagia.    HPI HPI: Becky Patterson is a 60 y.o. female history of infective endocarditis that was treated with discharge on 2/2, continuing on antibiotics at home, lung mass noted on that admission. Re-admitted with left sided weakness that progressed after admission. MRI: Large acute right middle cerebral artery distribution infarct      SLP Plan        Recommendations  Diet recommendations: Dysphagia 3 (mechanical soft);Honey-thick liquid Liquids provided via: Cup;Teaspoon Compensations: Slow rate;Small sips/bites;Multiple dry swallows after each bite/sip;Lingual sweep for clearance of pocketing Postural Changes and/or Swallow Maneuvers:  Seated upright 90 degrees             Oral Care Recommendations: Oral care BID Follow up Recommendations: Inpatient Rehab     GO                Juan Quam Laurice 07/04/2015, 5:20 PM

## 2015-07-04 NOTE — Progress Notes (Signed)
Inpatient Rehabilitation  Met with patient and family at bedside to discuss team's recommendation for IP Rehab.  Patient's family expressed interest and I initiated insurance authorization.    Carmelia Roller., CCC/SLP Admission Coordinator  [place]  Cell [phone]

## 2015-07-04 NOTE — Progress Notes (Signed)
STROKE TEAM PROGRESS NOTE   SUBJECTIVE (INTERVAL HISTORY) Her husband and family are at the bedside. Patient now taking a limited diet, off all drips. Significant neglect. Family excited about her progress.   OBJECTIVE Temp:  [97.9 F (36.6 C)-98.7 F (37.1 C)] 97.9 F (36.6 C) (02/10 0400) Pulse Rate:  [25-125] 92 (02/10 0900) Cardiac Rhythm:  [-] Normal sinus rhythm (02/09 2015) Resp:  [14-22] 19 (02/10 0900) BP: (109-147)/(65-79) 109/67 mmHg (02/10 0900) SpO2:  [92 %-100 %] 96 % (02/10 0900) Weight:  [90.3 kg (199 lb 1.2 oz)] 90.3 kg (199 lb 1.2 oz) (02/10 0500)  CBC:   Recent Labs Lab 06/29/15 1014  06/30/15 0430  07/02/15 0533 07/03/15 0545  WBC 15.0*  --  14.5*  < > 13.0* 12.5*  NEUTROABS 11.9*  --  11.3*  --   --   --   HGB 8.7*  9.5*  < > 8.1*  < > 7.0* 7.3*  HCT 25.9*  28.0*  < > 24.0*  < > 22.5* 22.6*  MCV 92.8  --  92.3  < > 93.4 93.4  PLT 390  --  366  < > 335 355  < > = values in this interval not displayed.  Basic Metabolic Panel:   Recent Labs Lab 07/02/15 0533  07/03/15 0545 07/03/15 1930 07/04/15 0630  NA 144  < > 143 140 142  K 3.4*  --  3.4*  --  2.9*  CL 112*  --  107  --  108  CO2 25  --  25  --  25  GLUCOSE 149*  --  127*  --  110*  BUN 12  --  11  --  11  CREATININE 0.75  --  0.76  --  0.64  CALCIUM 8.3*  --  8.6*  --  7.8*  MG 1.9  --  1.8  --   --   PHOS 2.4*  --  3.3  --   --   < > = values in this interval not displayed.  Lipid Panel:     Component Value Date/Time   CHOL 64 06/30/2015 0430   TRIG 109 06/30/2015 0430   HDL 16* 06/30/2015 0430   CHOLHDL 4.0 06/30/2015 0430   VLDL 22 06/30/2015 0430   LDLCALC 26 06/30/2015 0430   HgbA1c:  Lab Results  Component Value Date   HGBA1C 6.3* 06/30/2015   Urine Drug Screen:     Component Value Date/Time   LABOPIA NONE DETECTED 06/29/2015 1048   COCAINSCRNUR NONE DETECTED 06/29/2015 1048   LABBENZ NONE DETECTED 06/29/2015 1048   AMPHETMU NONE DETECTED 06/29/2015 1048   THCU  POSITIVE* 06/29/2015 1048   LABBARB NONE DETECTED 06/29/2015 1048      IMAGING  Ct Head Wo Contrast 07/02/2015 1. Continued interval evolution of large ischemic right MCA territory infarct, overall stable in size and distribution  Relative to recent MRI. No evidence for hemorrhagic transformation. Localized mass effect with effacement of the right lateral ventricle, right uncal herniation, and 4 mm of right-to-left shift present. No hydrocephalus or evidence for ventricular trapping. 2. Stable size of smaller left anterior insular/subinsular ischemic infarct. No evidence for associated hemorrhage or mass effect. 3. No other acute intracranial process. 06/29/2105 1. 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 2. Sparing of the superior right MCA division. This may be secondary to collaterals of the right anterior cerebral artery. 3. Nonhemorrhagic infarction within the basal ganglia. 06/29/2015   No adverse features status post  Neurointervention for emergent right ICA terminus occlusion. No acute or developing right hemisphere infarct identified.  06/29/2015   Mild chronic microvascular ischemia.  No acute abnormality   Ct Angio Head & Neck W/cm &/or Wo/cm 06/29/2015  1. Positive for emergent large vessel occlusion: Occluded right ICA terminus, right MCA and right ACA origins. 7-8 mm occluded segment of the right M1 segment but good right MCA M2 and distal collaterals. Also, the left A1 is dominant such that bilateral A2 and distal ACA flow appears symmetric.  2. Mildly to moderately tortuous cervical ICAs. Mild plaque at the right ICA origin and bulb without stenosis. Minimal calcified right ICA siphon plaque. Moderate plaque at the distal left ICA bulb but without hemodynamically significant stenosis. 3. Proximal left subclavian artery soft plaque without significant stenosis. Negative posterior circulation. 4. Grossly stable CT appearance of the brain since 1001 hours.   Cerebral angiogram   06/29/2015 bilateral CCA and right vertebral artery arteriograms followed by attempted right ICA terminus and right MCA occlusion using 3 passes of the [employer] [place] stent retrieval device and 8 mg of superselective E Integrilin. Robust crass filling from left ICA versus ACom and R PCA leptomeningeals.  MRI brain  07/01/2015 Large acute right middle cerebral artery distribution infarct involving portions the right temporal lobe, right frontal lobe, right parietal lobe, operculum region, right basal ganglia and right corona radiata with local mass effect upon the right lateral ventricle with slight bowing of the septum to the left by 2 mm. On gradient sequence no associated blood however, slight T1 hyperintensity within the right lenticular nucleus and subtle blood at this level therefore not excluded. Mild right uncal herniation. Nonhemorrhagic acute anterior left subinsular/peri operculum infarct.  Dg Chest Port 1 View 07/02/2015 Endotracheal tube not visualized and has presumably been removed. Cardiomegaly with mild bibasilar atelectasis. 07/02/2015 Endotracheal tube 9 cm from carina. Consider advancement by 2-3 cm. Central venous congestion 06/30/2105 Endotracheal to several cm above the carina as described. This could be advanced of 4-5 cm. 06/29/2105 1. Endotracheal tube tip at the thoracic inlet, interim advancement of approximately 3-4 cm should be considered. NG tube in stable position. The 2. Interim partial clearing of right upper lobe atelectasis. 3. Cardiomegaly with mild pulmonary interstitial prominence. A mild component of congestive heart failure cannot be excluded.  06/29/2015  Endotracheal tube is 6 cm above the carina, recommend advancing 2 cm Right upper lobe collapse    PHYSICAL EXAM Elderly caucasian lady who is not in distress. . Afebrile. Head is nontraumatic. Neck is supple without bruit.    Cardiac exam no murmur or gallop. Lungs are clear to auscultation. Distal pulses are well  felt. She has a diffuse maculopapular rash which is new Neurological Exam :  Awake alert oriented x 2. Right gaze preference unable to look to the left past midline. Blinks to threat on the right but not on the left. Pupils irregular but reactive. Fundi could not be visualized. Follows simple midline and commands on the right side consistently. Speech is slightly hoarse Mild left hemi-neglect. Not consistently able to recognize her own left side. Able to point towards family members. Normal strength in the right. Left hemiplegia with only trace withdrawal to painful stimuli in the legs and none in the arm. Diminished on the left side. Right plantar downgoing left upgoing.   ASSESSMENT/PLAN Becky Patterson is a 60 y.o. female with history of infective endocarditis presenting with left arm weakness. She did not receive IV t-PA  due to recent endocarditis. She was taken to IR where there was attempted revascularization of the right MCA occlusion using mechanical thrombectomy and intra-arterial Integrilin.   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 s/p attempted revascularization with cytotoxic cerebral edema with uncal herniation, infarct embolic secondary to known MV infective Endocarditis   Resultant  VDRF, L hemiparesis, left gaze paresis, left hemianopsia  CTA head and neck R ICA terminus, R ACA, RMCA occlusion  Cerebral angiogram R ICA terminus, R MCA occlusion, some flow from L ICA, L Acom, R PCA and leptomeningeals  Repeat CT 2/6 large R MCA terrirotry infarct sparing the superior R MCA division  repeat CT 2/8 stable  MRI  Large R MCA embolic infarct with 24m Left shift, mild R uncal herniation. Also L subinsular/periopercular infarct  TEE 06/24/2015 MV endocarditis, vegetation both leaflets but trivial MR  LDL 26, at goal < 70  HgbA1c 6.9 in Jan  SCDs for VTE prophylaxis DIET DYS 3 Room service appropriate?: Yes; Fluid  consistency:: Honey Thick  No antithrombotic prior to admission, now on No antithrombotic due to endocarditis and risk of bleeing  Ongoing aggressive stroke risk factor management  Therapy recommendations:  CIR. Rehab admissions coordinator following  Disposition:  pending   Palliative care following to assist family with goals, emotional support  DNR/DNI, o/w aggressive care  Ok to transfer to floor. Medically ready for CIR once bed available.  Acute Respiratory Failure  intubated for neuro intervention  Extubated 07/02/2015  CCM signed off 07/03/2015  Cytotoxic Cerebral Edema  Induced hypernatremia  Started on 3% to induce hypernatremia in order to decrease cerebral edema  Very sensitive to 3% - when on drip, Na up, when rate decreased, overall Na rapidly decreases  3% totally off 2/10 am  Na 142   MV Endocarditis with splenic infarcts  dx 05/2015 with sepsis from enterococcus UTI, pyelo, bacteremia   on Cipro 750 mg bid since 06/25/15 (planned total 6 weeks treatment with fluoroquinolone, had been on rocephin 1/23 - 1/30 (no abx 1/31))  new CTX 2 mg IV q 24h this admission  Cipro and CTX changed to Primaxin, D#4  WBC 15.0-> 12.5  Has PICC line   Planned OP followup - by ID and Cardiology to monitor vegetation  Maculopapular Rash with itching, improved  Over thorax  ? Transfusion reaction vs abx reaction. Unsure etiology  Rocephin and Cipro changed to Primaxin, D#4  Treated with Benadryl   Acute Blood Loss Anemia Transfusion Reaction Anemia of Chronic disease  Hgb 9.5 on admission  Received 1 unit. 2nd unit held due to transfusion reaction of T 101.5  Rash following blood transfusion, unclear if related to blood or abx  Hgb 7.3  Hypotension  Treated with neo drip  Neo now off   BP stable 110-147  Lung Mass and Liver Mass  1.8 superior segment LLL lung mass and liver mass  Metastasis of unknown Primary vs hamartoma  oncology has not  [facility]  OP PET is pending late Feb.   Diabetes  HgbA1c 6.9, at goal < 7.0  Other Stroke Risk Factors  smoker   THC, UDS positive this admission  Obesity, Body mass index is 36.4 kg/(m^2).   Other Active Problems  Hypokalemia, replaced with 4 runs  Chemistry in am  [place] P[place] information  07/04/2015 9:53 AM  I have personally examined this patient, reviewed notes, independently viewed imaging studies, participated in medical decision making and plan of care. I have made any additions or clarifications directly to the above note. Agree with note above. Plan to transfer out of ICU to a rehabilitation bed and is not available to neurology floor bed later today. Continue physical occupational speech therapy. I had a long discussion with the patient's husband and family members at the bedside and answered questions. Anticipate slow gradual recovery over the next several weeks to months. Continue antibiotic therapy as per infectious disease. This patient is critically ill and at significant risk of neurological worsening, death and care requires constant monitoring of vital signs, hemodynamics,respiratory and cardiac monitoring, extensive review of multiple databases, frequent neurological assessment, discussion with family, other specialists and medical decision making of high complexity.I have made any additions or clarifications directly to the above note.This critical care time does not reflect procedure time, or teaching time or supervisory time of PA/NP/Med Resident etc but could involve care discussion time.  I spent 30 minutes of neurocritical care time  in the care of  this patient.      Antony Contras, MD Medical Director [facility] Pager: [phone] 07/04/2015 2:25 PM    To contact Stroke Continuity provider, please refer to  [email or web address] After hours, contact General Neurology

## 2015-07-04 NOTE — Progress Notes (Signed)
Nutrition Follow-up  DOCUMENTATION CODES:   Obesity unspecified  INTERVENTION:  -Provide magic cup TID, 290 kcal, 9 grams of protein per serving  NUTRITION DIAGNOSIS:   Inadequate oral intake related to dysphagia, poor appetite (taste alterations) as evidenced by meal completion < 50%, per patient/family report.  GOAL:   Patient will meet greater than or equal to 90% of their needs  MONITOR:   PO intake, Supplement acceptance, Diet advancement  ASSESSMENT:   Pt with recent admission for sepsis with endocarditits on prolonged abx at discharge followed by ID with acute CVA most likely from embolization despite abx therapy. Admitted with left sided weakness. Intubated for respiratory failure in setting of Acute CVA s/p IR thrombectomy. 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   2/9-diet advancement to dysphagia 3/ honey thick liquids  Per pt's husband, for breakfast she ate sausage, strawberry yogurt, and orange juice. Pt is consuming less then 50% of her needs. She nodded acceptance to trying magic cup to help meet her calorie needs.   [name] reports that she is experiencing diarrhea.   Possible rehab admission today.  Reviewed medications. Reviewed labs; Potassium 2.9   Diet Order:  DIET DYS 3 Room service appropriate?: Yes; Fluid consistency:: Honey Thick  Skin:  Reviewed, no issues  Last BM:  07/04/15  Height:   Ht Readings from Last 1 Encounters:  06/29/15 '5\' 2"'$  (1.575 m)    Weight:   Wt Readings from Last 1 Encounters:  07/04/15 199 lb 1.2 oz (90.3 kg)    Ideal Body Weight:  50 kg  BMI:  Body mass index is 36.4 kg/(m^2).  Estimated Nutritional Needs:   Kcal:  1600-1800  Protein:  90-100 grams  Fluid:  > 1.6 L  EDUCATION NEEDS:   No education needs identified at this time  [place], Dietetic Intern Pager: [phone]

## 2015-07-04 NOTE — Progress Notes (Signed)
[facility] for Infectious Disease    Date of Admission:  06/29/2015   Total days of antibiotics 18        Day 5 imipenem           ID: Becky Patterson is a 60 y.o. 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 Pt having cerebral edema due to large right MCA territory infarct with Left side neurologic insult Active Problems:   Stroke (cerebrum) (HCC)   Acute respiratory failure with hypoxia (HCC)   Arterial hypotension   Acute encephalopathy   Stroke due to embolism [facility])   Palliative care encounter   DNR (do not resuscitate) discussion   Dysphagia, post-stroke   Left hemiparesis ([place])   Dysarthria due to recent stroke   Visual field cut    Subjective: Afebrile, She is following commands. She participated with PT sitting up on edge of bed  Interval hx: over the last 3 days she was extubated and remains on room air. Being transferred to acute rehab  Medications:  . antiseptic oral rinse  7 mL Mouth Rinse BID  . chlorhexidine gluconate  15 mL Mouth Rinse BID  . imipenem-cilastatin  500 mg Intravenous Q6H  . insulin aspart  0-15 Units Subcutaneous 6 times per day  . multivitamin with minerals  1 tablet Per Tube Daily  . sodium chloride flush  10-40 mL Intracatheter Q12H    Objective: Vital signs in last 24 hours: Temp:  [97.5 F (36.4 C)-98.7 F (37.1 C)] 98.6 F (37 C) (02/10 1600) Pulse Rate:  [81-125] 84 (02/10 1600) Resp:  [14-22] 18 (02/10 1600) BP: (86-147)/(48-79) 103/48 mmHg (02/10 1600) SpO2:  [92 %-97 %] 97 % (02/10 1600) Weight:  [199 lb 1.2 oz (90.3 kg)] 199 lb 1.2 oz (90.3 kg) (02/10 0500) Physical Exam  Constitutional:  Sleeping. Opens eyes to name. appears well-developed and well-nourished. No distress.  HENT: [place]/AT, PERRLA, no scleral icterus Mouth/Throat: Oropharynx is clear and moist. No oropharyngeal exudate.  Cardiovascular: Normal  rate, regular rhythm and normal heart sounds. Exam reveals no gallop and no friction rub.  No murmur heard.  Pulmonary/Chest: Effort normal and breath sounds normal. No respiratory distress.  has no wheezes.  Abdominal: Soft. Bowel sounds are normal.  exhibits no distension. There is no tenderness.  Skin: Skin is warm and dry.   Lab Results  Recent Labs  07/02/15 0533  07/03/15 0545 07/03/15 1930 07/04/15 0630  WBC 13.0*  --  12.5*  --   --   HGB 7.0*  --  7.3*  --   --   HCT 22.5*  --  22.6*  --   --   NA 144  < > 143 140 142  K 3.4*  --  3.4*  --  2.9*  CL 112*  --  107  --  108  CO2 25  --  25  --  25  BUN 12  --  11  --  11  CREATININE 0.75  --  0.76  --  0.64  < > = values in this interval not displayed. Liver Panel No results for input(s): PROT, ALBUMIN, AST, ALT, ALKPHOS, BILITOT, BILIDIR, IBILI in the last 72 hours.  Microbiology: 2/5blood cx ngtd Studies/Results: Dg Chest Port 1 View  07/03/2015  CLINICAL DATA:  Endotracheal tube. EXAM: PORTABLE CHEST 1 VIEW COMPARISON:  07/02/2015 FINDINGS: Endotracheal tube not visualized and has presumably been  removed. A right-sided PICC line terminates at the mid to low SVC. Midline trachea. Cardiomegaly accentuated by AP portable technique. No pleural effusion or pneumothorax. Improved to resolved interstitial edema. Pulmonary interstitial prominence is favored to be related to AP portable technique. Similar bibasilar atelectasis. IMPRESSION: Endotracheal tube not visualized and has presumably been removed. Cardiomegaly with mild bibasilar atelectasis. Electronically Signed   By: Abigail Miyamoto M.D.   On: 07/03/2015 07:54   Dg Swallowing Func-speech Pathology  07/03/2015  Objective Swallowing Evaluation: Type of Study: MBS-Modified Barium Swallow Study Patient Details Name: Becky Patterson MRN: [id number] Date of Birth: Oct 18, 1955 Today's Date: 07/03/2015 Time: SLP Start Time (ACUTE ONLY): 1346-SLP Stop Time (ACUTE ONLY): 1357 SLP Time  Calculation (min) (ACUTE ONLY): 11 min Past Medical History: No past medical history on file. Past Surgical History: Past Surgical History Procedure Laterality Date . Back surgery   . Tee without cardioversion N/A 06/24/2015   Procedure: TRANSESOPHAGEAL ECHOCARDIOGRAM (TEE);  Surgeon: Larey Dresser, MD;  Location: [facility];  Service: Cardiovascular;  Laterality: N/A; . Radiology with anesthesia N/A 06/29/2015   Procedure: RADIOLOGY WITH ANESTHESIA;  Surgeon: Luanne Bras, MD;  Location: [facility];  Service: Radiology;  Laterality: N/A; HPI: [name] is a 60 y.o. female history of infective endocarditis that was treated with discharge on 2/2, continuing on antibiotics at home, lung mass noted on that admission. Re-admitted with left sided weakness that progressed after admission. 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 Combination of above leads to silent aspiration of liquids thinner than honey thick. Patient able to protect airway with solids and honey thick liquids provided with mild vallecular residuals remaining post swallow due to base of tongue weakness, clearing with cued dry swallow. Recommend initiation of conservative diet with close SLP f/u for tolerance and potential to advance which is good with improved alertness and neuro functioning.  Impact on safety and function Moderate aspiration risk   CHL IP TREATMENT RECOMMENDATION 07/03/2015 Treatment Recommendations Therapy as outlined in treatment plan below   Prognosis 07/03/2015 Prognosis for Safe Diet Advancement Good  Barriers to Reach Goals -- Barriers/Prognosis Comment -- CHL IP DIET RECOMMENDATION 07/03/2015 SLP Diet Recommendations Dysphagia 3 (Mech soft) solids;Honey thick liquids Liquid Administration via Cup;No straw Medication Administration Crushed with puree Compensations Slow rate;Small sips/bites;Multiple dry swallows after each bite/sip;Lingual sweep for clearance of pocketing Postural Changes Seated upright at 90 degrees   CHL IP OTHER RECOMMENDATIONS 07/03/2015 Recommended Consults -- Oral Care Recommendations Oral care BID Other Recommendations Order thickener from pharmacy;Prohibited food (jello, ice cream, thin soups);Remove water pitcher   CHL IP FOLLOW UP RECOMMENDATIONS 07/03/2015 Follow up Recommendations Inpatient Rehab   CHL IP FREQUENCY AND DURATION 07/03/2015 Speech Therapy Frequency (ACUTE ONLY) min 3x week Treatment Duration 2 weeks      CHL IP ORAL PHASE 07/03/2015 Oral Phase Impaired Oral - Pudding Teaspoon -- Oral - Pudding Cup -- Oral - Honey Teaspoon Lingual/palatal residue;Delayed oral transit;Decreased bolus cohesion Oral - Honey Cup Lingual/palatal residue;Delayed oral transit;Decreased bolus cohesion Oral - Nectar Teaspoon Lingual/palatal residue;Delayed oral transit;Decreased bolus cohesion Oral - Nectar Cup -- Oral - Nectar Straw -- Oral - Thin Teaspoon Lingual/palatal residue;Delayed oral transit;Decreased  bolus cohesion Oral - Thin Cup -- Oral - Thin Straw -- Oral - Puree Lingual/palatal residue;Delayed oral transit Oral - Mech Soft Lingual/palatal residue;Delayed oral transit Oral - Regular -- Oral - Multi-Consistency -- Oral - Pill -- Oral Phase - Comment --  CHL IP PHARYNGEAL PHASE 07/03/2015 Pharyngeal Phase Impaired Pharyngeal- Pudding Teaspoon -- Pharyngeal -- Pharyngeal- Pudding Cup -- Pharyngeal -- Pharyngeal- Honey Teaspoon Delayed swallow initiation-vallecula;Pharyngeal residue - valleculae;Reduced tongue base retraction Pharyngeal -- Pharyngeal- Honey Cup Delayed swallow  initiation-vallecula;Pharyngeal residue - valleculae;Reduced tongue base retraction Pharyngeal -- Pharyngeal- Nectar Teaspoon Pharyngeal residue - valleculae;Reduced tongue base retraction;Delayed swallow initiation-pyriform sinuses;Penetration/Aspiration before swallow Pharyngeal Material enters airway, passes BELOW cords without attempt by patient to eject out (silent aspiration) Pharyngeal- Nectar Cup -- Pharyngeal -- Pharyngeal- Nectar Straw -- Pharyngeal -- Pharyngeal- Thin Teaspoon Pharyngeal residue - valleculae;Reduced tongue base retraction;Delayed swallow initiation-pyriform sinuses;Penetration/Aspiration before swallow Pharyngeal Material enters airway, passes BELOW cords without attempt by patient to eject out (silent aspiration) Pharyngeal- Thin Cup -- Pharyngeal -- Pharyngeal- Thin Straw -- Pharyngeal -- Pharyngeal- Puree Delayed swallow initiation-vallecula;Pharyngeal residue - valleculae;Reduced tongue base retraction Pharyngeal -- Pharyngeal- Mechanical Soft Delayed swallow initiation-vallecula;Pharyngeal residue - valleculae;Reduced tongue base retraction Pharyngeal -- Pharyngeal- Regular -- Pharyngeal -- Pharyngeal- Multi-consistency -- Pharyngeal -- Pharyngeal- Pill -- Pharyngeal -- Pharyngeal Comment --  CHL IP CERVICAL ESOPHAGEAL PHASE 07/03/2015 Cervical Esophageal Phase WFL Pudding Teaspoon -- Pudding Cup -- Honey Teaspoon -- Honey Cup -- Nectar Teaspoon -- Nectar Cup -- Nectar Straw -- Thin Teaspoon -- Thin Cup -- Thin Straw -- Puree -- Mechanical Soft -- Regular -- Multi-consistency -- Pill -- Cervical Esophageal Comment -- No flowsheet data found. 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  Patient has evolving large right mca territory infarct as well as small infarct to basal ganglia with cerebral edema. Currently on day 18 of abtx  - last week it was thought she had drug allergy though it was difficult to tell if it was from cipro vs. Ceftriaxone. Tomorrow I would like to do a challenge and switch back to ceftriaxone. If she develops rash again, then we would know what was the causitive agent. She is currently on a broad spectrum abtx that would be nice to narrow her spectrum.  -macularpapupular rash = likely abtx associated rash. Now resolved.  Patient is now on carbapenem should only have small risk of cross reacting.  - would recommend TTE/TEE at end of course of therapy  [place], [facility] for Infectious Diseases Cell: [phone] Pager: [phone]  07/04/2015, 5:30 PM

## 2015-07-05 DIAGNOSIS — G4459 Other complicated headache syndrome: Secondary | ICD-10-CM

## 2015-07-05 LAB — GLUCOSE, CAPILLARY
GLUCOSE-CAPILLARY: 99 mg/dL (ref 65–99)
Glucose-Capillary: 101 mg/dL — ABNORMAL HIGH (ref 65–99)
Glucose-Capillary: 113 mg/dL — ABNORMAL HIGH (ref 65–99)
Glucose-Capillary: 149 mg/dL — ABNORMAL HIGH (ref 65–99)
Glucose-Capillary: 155 mg/dL — ABNORMAL HIGH (ref 65–99)
Glucose-Capillary: 141 mg/dL — ABNORMAL HIGH (ref 65–99)

## 2015-07-05 LAB — CBC
HEMATOCRIT: 23.5 % — AB (ref 36.0–46.0)
HEMOGLOBIN: 7.3 g/dL — AB (ref 12.0–15.0)
MCH: 28.9 pg (ref 26.0–34.0)
MCHC: 31.1 g/dL (ref 30.0–36.0)
MCV: 92.9 fL (ref 78.0–100.0)
Platelets: 363 10*3/uL (ref 150–400)
RBC: 2.53 MIL/uL — ABNORMAL LOW (ref 3.87–5.11)
RDW: 14.6 % (ref 11.5–15.5)
WBC: 9.1 10*3/uL (ref 4.0–10.5)

## 2015-07-05 LAB — BASIC METABOLIC PANEL
Anion gap: 8 (ref 5–15)
BUN: 11 mg/dL (ref 6–20)
CALCIUM: 8.1 mg/dL — AB (ref 8.9–10.3)
CHLORIDE: 104 mmol/L (ref 101–111)
CO2: 26 mmol/L (ref 22–32)
Creatinine, Ser: 0.72 mg/dL (ref 0.44–1.00)
GFR calc Af Amer: >60 mL/min (ref >60)
GLUCOSE: 100 mg/dL — AB (ref 65–99)
POTASSIUM: 3.3 mmol/L — AB (ref 3.5–5.1)
Sodium: 138 mmol/L (ref 135–145)

## 2015-07-05 MED ORDER — INSULIN ASPART 100 UNIT/ML ~~LOC~~ SOLN
0.0000 [IU] | Freq: Three times a day (TID) | SUBCUTANEOUS | Status: DC
Start: 2015-07-05 — End: 2015-07-07
  Administered 2015-07-05: 3 [IU] via SUBCUTANEOUS
  Administered 2015-07-06 – 2015-07-07 (×4): 2 [IU] via SUBCUTANEOUS
  Administered 2015-07-07: 1 [IU] via SUBCUTANEOUS

## 2015-07-05 MED ORDER — DEXTROSE 5 % IV SOLN
2.0000 g | Freq: Two times a day (BID) | INTRAVENOUS | Status: DC
  Administered 2015-07-05 – 2015-07-07 (×5): 2 g via INTRAVENOUS
  Filled 2015-07-05 (×7): qty 2

## 2015-07-05 MED ORDER — ACETAMINOPHEN 325 MG PO TABS
650.0000 mg | ORAL_TABLET | Freq: Four times a day (QID) | ORAL | Status: DC | PRN
  Administered 2015-07-06 – 2015-07-07 (×4): 650 mg via ORAL
  Filled 2015-07-05 (×5): qty 2

## 2015-07-05 MED ORDER — POTASSIUM CHLORIDE CRYS ER 20 MEQ PO TBCR
40.0000 meq | EXTENDED_RELEASE_TABLET | ORAL | Status: AC
  Administered 2015-07-05 (×2): 40 meq via ORAL
  Filled 2015-07-05 (×2): qty 2

## 2015-07-05 MED ORDER — ENOXAPARIN SODIUM 40 MG/0.4ML ~~LOC~~ SOLN
40.0000 mg | SUBCUTANEOUS | Status: DC
Start: 2015-07-05 — End: 2015-07-07
  Administered 2015-07-05 – 2015-07-06 (×2): 40 mg via SUBCUTANEOUS
  Filled 2015-07-05 (×2): qty 0.4

## 2015-07-05 MED ORDER — OXYCODONE-ACETAMINOPHEN 5-325 MG PO TABS
1.0000 | ORAL_TABLET | Freq: Four times a day (QID) | ORAL | Status: DC | PRN
  Administered 2015-07-05 – 2015-07-07 (×5): 1 via ORAL
  Filled 2015-07-05 (×5): qty 1

## 2015-07-05 NOTE — Progress Notes (Signed)
Daily Progress Note   Patient Name: Becky Patterson       Date: 07/05/2015 DOB: November 06, 1955  Age: 60 y.o. MRN#: [id number] Attending Physician: Garvin Fila, MD Primary Care Physician: No primary care provider on file. Admit Date: 06/29/2015  Reason for Consultation/Follow-up: Establishing goals of care and Psychosocial/spiritual support  Subjective:  -f/u visit at bedside with patient and family member, c/o headache and family reports "an uncomfortable night".  Discussed Tylenol vs opioid trial for headache, repositioned in bed  - -all questions and concerns addressed  -PMT will continue to support holistically    Length of Stay: 6 days  Current Medications: Scheduled Meds:  . antiseptic oral rinse  7 mL Mouth Rinse BID  . chlorhexidine gluconate  15 mL Mouth Rinse BID  . imipenem-cilastatin  500 mg Intravenous Q6H  . insulin aspart  0-15 Units Subcutaneous 6 times per day  . multivitamin with minerals  1 tablet Per Tube Daily  . sodium chloride flush  10-40 mL Intracatheter Q12H    Continuous Infusions:    PRN Meds: acetaminophen, diphenhydrAMINE, fentaNYL (SUBLIMAZE) injection, sodium chloride flush  Physical Exam: Physical Exam  Constitutional: She appears well-developed.  HENT:  Head: Normocephalic.  Pulmonary/Chest: She has decreased breath sounds in the right lower field and the left lower field.  Abdominal: Soft. Normal appearance and bowel sounds are normal.  Neurological: She is alert. A cranial nerve deficit and sensory deficit is present. She exhibits abnormal muscle tone.  Left sided paresis   Skin: Skin is warm and dry.                Vital Signs: BP 112/57 mmHg  Pulse 91  Temp(Src) 99.1 F (37.3 C) (Oral)  Resp 18  Ht '5\' 2"'$  (1.575 m)  Wt 90.3 kg  (199 lb 1.2 oz)  BMI 36.40 kg/m2  SpO2 96% SpO2: SpO2: 96 % O2 Device: O2 Device: Not Delivered O2 Flow Rate: O2 Flow Rate (L/min): 2 L/min  Intake/output summary:   Intake/Output Summary (Last 24 hours) at 07/05/15 0745 Last data filed at 07/04/15 1652  Gross per 24 hour  Intake    500 ml  Output    250 ml  Net    250 ml   LBM: Last BM Date: 07/04/15 Baseline Weight: Weight: 87.2 kg (192  lb 3.9 oz) Most recent weight: Weight: 90.3 kg (199 lb 1.2 oz)       Palliative Assessment/Data: Flowsheet Rows        Most Recent Value   Intake Tab    Referral Department  Critical care   Unit at Time of Referral  ICU   Palliative Care Primary Diagnosis  Sepsis/Infectious Disease   Date Notified  07/02/15   Palliative Care Type  New Palliative care   Reason for referral  Clarify Goals of Care, Non-pain Symptom   Date of Admission  06/29/15   Date first seen by Palliative Care  07/02/15   # of days Palliative referral response time  0 Day(s)   # of days IP prior to Palliative referral  3   Clinical Assessment    Psychosocial & Spiritual Assessment    Palliative Care Outcomes       Additional Data Reviewed: CBC    Component Value Date/Time   WBC 9.1 07/05/2015 0430   RBC 2.53* 07/05/2015 0430   RBC 2.70* 06/15/2015 2248   HGB 7.3* 07/05/2015 0430   HCT 23.5* 07/05/2015 0430   PLT 363 07/05/2015 0430   MCV 92.9 07/05/2015 0430   MCH 28.9 07/05/2015 0430   MCHC 31.1 07/05/2015 0430   RDW 14.6 07/05/2015 0430   LYMPHSABS 1.9 06/30/2015 0430   MONOABS 1.0 06/30/2015 0430   EOSABS 0.3 06/30/2015 0430   BASOSABS 0.0 06/30/2015 0430    CMP     Component Value Date/Time   NA 138 07/05/2015 0430   K 3.3* 07/05/2015 0430   CL 104 07/05/2015 0430   CO2 26 07/05/2015 0430   GLUCOSE 100* 07/05/2015 0430   BUN 11 07/05/2015 0430   CREATININE 0.72 07/05/2015 0430   CALCIUM 8.1* 07/05/2015 0430   PROT 6.4* 06/29/2015 1014   ALBUMIN 2.2* 06/29/2015 1014   AST 20 06/29/2015  1014   ALT 16 06/29/2015 1014   ALKPHOS 76 06/29/2015 1014   BILITOT 0.4 06/29/2015 1014   GFRNONAA >60 07/05/2015 0430   GFRAA >60 07/05/2015 0430       Problem List:  Patient Active Problem List   Diagnosis Date Noted  . 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 Left hemiparesis ([place]) 07/03/2015  . 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 Visual field cut 07/03/2015  . Palliative care encounter   . DNR (do not resuscitate) discussion   . Acute respiratory failure with hypoxia ([place]) 06/30/2015  . Arterial hypotension 06/30/2015  . Acute encephalopathy   . Stroke due to embolism ([place])   . Stroke (cerebrum) ([place]) 06/29/2015  . Colitis   . Pyelonephritis   . Enterococcal bacteremia 06/19/2015  . Sepsis secondary to UTI ([place])   . Mass of lower lobe of left lung   . Tobacco abuse   . Anemia of chronic disease   . Hyponatremia   . Hyperglycemia   . Hypokalemia   . Lung mass 06/15/2015     Palliative Care Assessment & Plan    1.Code Status:  DNR    Code Status Orders        Start     Ordered   07/02/15 1252  Do not attempt resuscitation (DNR)   Continuous    Question Answer Comment  In the event of cardiac or respiratory ARREST Do not call a "code blue"   In the event of cardiac or respiratory ARREST Do not perform Intubation, CPR, defibrillation or ACLS   In the event of cardiac  or respiratory ARREST Use medication by any route, position, wound care, and other measures to relive pain and suffering. May use oxygen, suction and manual treatment of airway obstruction as needed for comfort.      07/02/15 1251    Code Status History    Date Active Date Inactive Code Status Order ID Comments User Context   06/29/2015  4:06 PM 07/02/2015 12:51 PM Full Code [id number]  Greta Doom, MD Inpatient   06/15/2015  9:48 PM 06/26/2015  3:29 PM Full Code [id number]  Edwin Dada, MD Inpatient       2. Goals of Care/Additional Recommendations:   Desire for  further Chaplaincy support:no  Psycho-social Needs: education and emotional  support   Symptom Management:      1. Weakness/ left sided: aggressive PT/OT/Speech therapy-likely transition to CIR       2.  Headache: Tylenol 650 mg po every 6 hr prn   Palliative Prophylaxis:   Aspiration, Bowel Regimen, Delirium Protocol, Frequent Pain Assessment, Oral Care and Turn Reposition   Discharge Planning:  [place] for rehab with Palliative care service follow-up     Thank you for allowing the Palliative Medicine Team to assist in the care of this patient.   Time In: 0700 Time Out: 0720 Total Time 20 min  Prolonged Time Billed  no         Knox Royalty, NP  07/05/2015, 7:45 AM  Please contact Palliative Medicine Team phone at [phone] for questions and concerns.

## 2015-07-05 NOTE — Progress Notes (Signed)
i have changed her from imipenem to ceftriaxone in attempts to narrow her abtx. If she develops pruritic rash, please discontinue ceftriaxone and switch back to imipenem. Will see her back tomorrow  Caren Griffins B. [facility] for Infectious Diseases [phone]

## 2015-07-05 NOTE — Progress Notes (Signed)
STROKE TEAM PROGRESS NOTE   SUBJECTIVE (INTERVAL HISTORY) Becky Patterson husband is at the bedside. Patient now taking a honey thick diet, off all drips. Still has left neglect. Hb stable. Still mildly low K. BP stable. Rash resolving. ID following changed back to rocephin.   OBJECTIVE Temp:  [97.9 F (36.6 C)-99.1 F (37.3 C)] 98.4 F (36.9 C) (02/11 1336) Pulse Rate:  [80-96] 90 (02/11 1336) Cardiac Rhythm:  [-] Normal sinus rhythm (02/11 0905) Resp:  [16-19] 18 (02/11 1336) BP: (103-126)/(48-72) 126/65 mmHg (02/11 1336) SpO2:  [96 %-99 %] 97 % (02/11 1336)  CBC:   Recent Labs Lab 06/29/15 1014  06/30/15 0430  07/03/15 0545 07/05/15 0430  WBC 15.0*  --  14.5*  < > 12.5* 9.1  NEUTROABS 11.9*  --  11.3*  --   --   --   HGB 8.7*  9.5*  < > 8.1*  < > 7.3* 7.3*  HCT 25.9*  28.0*  < > 24.0*  < > 22.6* 23.5*  MCV 92.8  --  92.3  < > 93.4 92.9  PLT 390  --  366  < > 355 363  < > = values in this interval not displayed.  Basic Metabolic Panel:   Recent Labs Lab 07/02/15 0533  07/03/15 0545  07/04/15 0630 07/05/15 0430  NA 144  < > 143  < > 142 138  K 3.4*  --  3.4*  --  2.9* 3.3*  CL 112*  --  107  --  108 104  CO2 25  --  25  --  25 26  GLUCOSE 149*  --  127*  --  110* 100*  BUN 12  --  11  --  11 11  CREATININE 0.75  --  0.76  --  0.64 0.72  CALCIUM 8.3*  --  8.6*  --  7.8* 8.1*  MG 1.9  --  1.8  --   --   --   PHOS 2.4*  --  3.3  --   --   --   < > = values in this interval not displayed.  Lipid Panel:     Component Value Date/Time   CHOL 64 06/30/2015 0430   TRIG 109 06/30/2015 0430   HDL 16* 06/30/2015 0430   CHOLHDL 4.0 06/30/2015 0430   VLDL 22 06/30/2015 0430   LDLCALC 26 06/30/2015 0430   HgbA1c:  Lab Results  Component Value Date   HGBA1C 6.3* 06/30/2015   Urine Drug Screen:     Component Value Date/Time   LABOPIA NONE DETECTED 06/29/2015 1048   COCAINSCRNUR NONE DETECTED 06/29/2015 1048   LABBENZ NONE DETECTED 06/29/2015 1048   AMPHETMU NONE  DETECTED 06/29/2015 1048   THCU POSITIVE* 06/29/2015 1048   LABBARB NONE DETECTED 06/29/2015 1048      IMAGING I have personally reviewed the radiological images below and agree with the radiology interpretations.  Ct Head Wo Contrast 07/02/2015 1. Continued interval evolution of large ischemic right MCA territory infarct, overall stable in size and distribution  Relative to recent MRI. No evidence for hemorrhagic transformation. Localized mass effect with effacement of the right lateral ventricle, right uncal herniation, and 4 mm of right-to-left shift present. No hydrocephalus or evidence for ventricular trapping. 2. Stable size of smaller left anterior insular/subinsular ischemic infarct. No evidence for associated hemorrhage or mass effect. 3. No other acute intracranial process. 06/29/2105 1. 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 2. Sparing of the superior right MCA division.  This may be secondary to collaterals of the right anterior cerebral artery. 3. Nonhemorrhagic infarction within the basal ganglia. 06/29/2015   No adverse features status post Neurointervention for emergent right ICA terminus occlusion. No acute or developing right hemisphere infarct identified.  06/29/2015   Mild chronic microvascular ischemia.  No acute abnormality   Ct Angio Head & Neck W/cm &/or Wo/cm 06/29/2015  1. Positive for emergent large vessel occlusion: Occluded right ICA terminus, right MCA and right ACA origins. 7-8 mm occluded segment of the right M1 segment but good right MCA M2 and distal collaterals. Also, the left A1 is dominant such that bilateral A2 and distal ACA flow appears symmetric.  2. Mildly to moderately tortuous cervical ICAs. Mild plaque at the right ICA origin and bulb without stenosis. Minimal calcified right ICA siphon plaque. Moderate plaque at the distal left ICA bulb but without hemodynamically significant stenosis. 3. Proximal left subclavian artery soft plaque without  significant stenosis. Negative posterior circulation. 4. Grossly stable CT appearance of the brain since 1001 hours.   Cerebral angiogram  06/29/2015 bilateral CCA and right vertebral artery arteriograms followed by attempted right ICA terminus and right MCA occlusion using 3 passes of the Solitaire [place] stent retrieval device and 8 mg of superselective E Integrilin. Robust crass filling from left ICA versus ACom and R PCA leptomeningeals.  MRI brain  07/01/2015 Large acute right middle cerebral artery distribution infarct involving portions the right temporal lobe, right frontal lobe, right parietal lobe, operculum region, right basal ganglia and right corona radiata with local mass effect upon the right lateral ventricle with slight bowing of the septum to the left by 2 mm. On gradient sequence no associated blood however, slight T1 hyperintensity within the right lenticular nucleus and subtle blood at this level therefore not excluded. Mild right uncal herniation. Nonhemorrhagic acute anterior left subinsular/peri operculum infarct.  Dg Chest Port 1 View 07/02/2015 Endotracheal tube not visualized and has presumably been removed. Cardiomegaly with mild bibasilar atelectasis. 07/02/2015 Endotracheal tube 9 cm from carina. Consider advancement by 2-3 cm. Central venous congestion 06/30/2105 Endotracheal to several cm above the carina as described. This could be advanced of 4-5 cm. 06/29/2105 1. Endotracheal tube tip at the thoracic inlet, interim advancement of approximately 3-4 cm should be considered. NG tube in stable position. The 2. Interim partial clearing of right upper lobe atelectasis. 3. Cardiomegaly with mild pulmonary interstitial prominence. A mild component of congestive heart failure cannot be excluded.  06/29/2015  Endotracheal tube is 6 cm above the carina, recommend advancing 2 cm Right upper lobe collapse    PHYSICAL EXAM  Temp:  [97.9 F (36.6 C)-99.1 F (37.3 C)] 98.4 F (36.9 C)  (02/11 1336) Pulse Rate:  [80-96] 90 (02/11 1336) Resp:  [16-19] 18 (02/11 1336) BP: (103-126)/(48-72) 126/65 mmHg (02/11 1336) SpO2:  [96 %-99 %] 97 % (02/11 1336)  General - Well nourished, well developed, lethargic.  Ophthalmologic - Fundi not visualized due to noncooperation.  Cardiovascular - Regular rate and rhythm.  Mental Status -  Level of arousal and orientation to time, place, and person were intact. Language including expression, naming, repetition, comprehension was assessed and found intact, but mild dysarthria and whisper. Left neglect. Fund of Knowledge was assessed and was intact.  Cranial Nerves II - XII - II - left visual neglect vs. Left homonymous hemianopia. III, IV, VI - right gaze preference but able to cross midline. V - Facial sensation intact bilaterally. VII - Facial movement showed left  facial droop. VIII - Hearing & vestibular intact bilaterally. X - Palate elevates symmetrically, but mild dysarthria and whisper.Marland Kitchen XI - Chin turning & shoulder shrug intact bilaterally. XII - Tongue protrusion intact.  Motor Strength - The patient's strength was RUE 0/5, RLE withdraw to pain but not against gravity, LUE 5/5 and LLE 4/5. Bulk was normal and fasciculations were absent.   Motor Tone - Muscle tone was assessed at the neck and appendages and was normal.  Reflexes - The patient's reflexes were 1+ in all extremities and she had no pathological reflexes.  Sensory - Light touch, temperature/pinprick were assessed and were symmetrical.    Coordination - The patient had normal movements in the right hand with no ataxia or dysmetria.  Tremor was absent.  Gait and Station - not tested due to weakness.   ASSESSMENT/PLAN Becky Patterson is a 60 y.o. female with history of infective endocarditis presenting with left arm weakness. She did not receive IV t-PA due to recent endocarditis. She was taken to IR where there was attempted revascularization of the right  MCA occlusion using mechanical thrombectomy and intra-arterial Integrilin.   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 Also L subinsular/periopercular infarct  TEE 06/24/2015 MV endocarditis, vegetation both leaflets but trivial MR  LDL 26, at goal < 70  HgbA1c 6.9 in Jan  lovenox subq for VTE prophylaxis DIET DYS 3 Room service appropriate?: Yes; Fluid consistency:: Honey Thick  No antithrombotic prior to admission, now on No antithrombotic due to endocarditis as the cause of stroke  Ongoing aggressive stroke risk factor management  Therapy recommendations:  CIR. Rehab admissions coordinator following  Disposition:  pending   Cytotoxic Cerebral Edema, resolved  Induced hypernatremia  Started on 3% to induce hypernatremia in order to decrease cerebral edema  Very sensitive to 3% - when on drip, Na up, when rate decreased, overall Na rapidly decreases  3% totally off 2/10 am  Na 142 -> 138   MV Endocarditis with splenic infarcts  dx 05/2015 with sepsis from enterococcus UTI, pyelo, bacteremia   on Cipro 750 mg bid since 06/25/15 (planned total 6 weeks treatment with fluoroquinolone, had been on rocephin 1/23 - 1/30 (no abx 1/31))  new CTX 2 mg IV q 24h this admission  Cipro and CTX changed to Primaxin, D#4  Now changed back to CTX   WBC 15.0->  12.5->9.1  Has PICC line   Planned OP followup - by ID and Cardiology to monitor vegetation  Maculopapular Rash with itching, improved  Over thorax  ? Transfusion reaction vs abx reaction. Unsure etiology  Rocephin and Cipro changed to Primaxin, D#4  Treated with Benadryl   Now back to CTX  Acute Blood Loss Anemia  Hgb 9.5 on admission  Received 1 unit. 2nd unit held due to transfusion reaction of T 101.5  Rash following blood transfusion, unclear  if related to blood or abx  Hgb 7.3 -> 7.3  Hypotension  Treated with neo drip  Neo now off   BP stable 110-147  Lung Mass and Liver Mass  1.8 superior segment LLL lung mass and liver mass  Metastasis of unknown Primary vs hamartoma  oncology has not seen pt last admission - appt at [facility] 07/25/15 at 930a  OP PET is pending late Feb.   Diabetes  HgbA1c 6.9, at goal < 7.0  Other Stroke Risk Factors  smoker   THC, UDS positive this admission  Obesity, Body mass index is 36.4 kg/(m^2).   Other Active Problems  Hypokalemia - supplement  Hospital day # 6  Rosalin Hawking, MD PhD Stroke Neurology 07/05/2015 4:02 PM   To contact Stroke Continuity provider, please refer to [email or web address] After hours, contact General Neurology

## 2015-07-06 DIAGNOSIS — G4459 Other complicated headache syndrome: Secondary | ICD-10-CM | POA: Insufficient documentation

## 2015-07-06 LAB — CBC
HEMATOCRIT: 25.2 % — AB (ref 36.0–46.0)
Hemoglobin: 7.9 g/dL — ABNORMAL LOW (ref 12.0–15.0)
MCH: 28.8 pg (ref 26.0–34.0)
MCHC: 31.3 g/dL (ref 30.0–36.0)
MCV: 92 fL (ref 78.0–100.0)
Platelets: 363 10*3/uL (ref 150–400)
RBC: 2.74 MIL/uL — ABNORMAL LOW (ref 3.87–5.11)
RDW: 14.7 % (ref 11.5–15.5)
WBC: 9.7 10*3/uL (ref 4.0–10.5)

## 2015-07-06 LAB — GLUCOSE, CAPILLARY
GLUCOSE-CAPILLARY: 131 mg/dL — AB (ref 65–99)
GLUCOSE-CAPILLARY: 126 mg/dL — AB (ref 65–99)
GLUCOSE-CAPILLARY: 142 mg/dL — AB (ref 65–99)

## 2015-07-06 LAB — BASIC METABOLIC PANEL
Anion gap: 9 (ref 5–15)
BUN: 8 mg/dL (ref 6–20)
CO2: 26 mmol/L (ref 22–32)
Calcium: 8.4 mg/dL — ABNORMAL LOW (ref 8.9–10.3)
Chloride: 103 mmol/L (ref 101–111)
Creatinine, Ser: 0.75 mg/dL (ref 0.44–1.00)
GFR calc Af Amer: >60 mL/min (ref >60)
GLUCOSE: 115 mg/dL — AB (ref 65–99)
POTASSIUM: 3.7 mmol/L (ref 3.5–5.1)
Sodium: 138 mmol/L (ref 135–145)

## 2015-07-06 MED ORDER — RESOURCE THICKENUP CLEAR PO POWD
ORAL | Status: DC | PRN
  Administered 2015-07-06: 08:00:00 via ORAL
  Filled 2015-07-06: qty 125

## 2015-07-06 MED ORDER — SODIUM CHLORIDE 0.9 % IV SOLN
INTRAVENOUS | Status: DC
  Administered 2015-07-06: 10:00:00 via INTRAVENOUS

## 2015-07-06 MED ORDER — CHLORHEXIDINE GLUCONATE 0.12 % MT SOLN
OROMUCOSAL | Status: AC
  Administered 2015-07-06: 15 mL
  Filled 2015-07-06: qty 15

## 2015-07-06 NOTE — Progress Notes (Signed)
STROKE TEAM PROGRESS NOTE   SUBJECTIVE (INTERVAL HISTORY) The patient's husband is at bedside. Her blood pressure has been running low. She is possibly dehydrated. Start normal saline at 50 mL per hour.   OBJECTIVE Temp:  [98 F (36.7 C)-99.1 F (37.3 C)] 99 F (37.2 C) (02/12 1400) Pulse Rate:  [81-92] 91 (02/12 1400) Cardiac Rhythm:  [-] Normal sinus rhythm (02/11 2130) Resp:  [16-20] 16 (02/12 1400) BP: (91-128)/(48-82) 128/82 mmHg (02/12 1400) SpO2:  [96 %-100 %] 98 % (02/12 1400)  CBC:   Recent Labs Lab 06/30/15 0430  07/05/15 0430 07/06/15 0215  WBC 14.5*  < > 9.1 9.7  NEUTROABS 11.3*  --   --   --   HGB 8.1*  < > 7.3* 7.9*  HCT 24.0*  < > 23.5* 25.2*  MCV 92.3  < > 92.9 92.0  PLT 366  < > 363 363  < > = values in this interval not displayed.  Basic Metabolic Panel:   Recent Labs Lab 07/02/15 0533  07/03/15 0545  07/05/15 0430 07/06/15 0215  NA 144  < > 143  < > 138 138  K 3.4*  --  3.4*  < > 3.3* 3.7  CL 112*  --  107  < > 104 103  CO2 25  --  25  < > 26 26  GLUCOSE 149*  --  127*  < > 100* 115*  BUN 12  --  11  < > 11 8  CREATININE 0.75  --  0.76  < > 0.72 0.75  CALCIUM 8.3*  --  8.6*  < > 8.1* 8.4*  MG 1.9  --  1.8  --   --   --   PHOS 2.4*  --  3.3  --   --   --   < > = values in this interval not displayed.  Lipid Panel:     Component Value Date/Time   CHOL 64 06/30/2015 0430   TRIG 109 06/30/2015 0430   HDL 16* 06/30/2015 0430   CHOLHDL 4.0 06/30/2015 0430   VLDL 22 06/30/2015 0430   LDLCALC 26 06/30/2015 0430   HgbA1c:  Lab Results  Component Value Date   HGBA1C 6.3* 06/30/2015   Urine Drug Screen:     Component Value Date/Time   LABOPIA NONE DETECTED 06/29/2015 1048   COCAINSCRNUR NONE DETECTED 06/29/2015 1048   LABBENZ NONE DETECTED 06/29/2015 1048   AMPHETMU NONE DETECTED 06/29/2015 1048   THCU POSITIVE* 06/29/2015 1048   LABBARB NONE DETECTED 06/29/2015 1048      IMAGING I have personally reviewed the radiological images  below and agree with the radiology interpretations.   Ct Head Wo Contrast 07/02/2015  1. Continued interval evolution of large ischemic right MCA territory infarct, overall stable in size and distribution  Relative to recent MRI. No evidence for hemorrhagic transformation. Localized mass effect with effacement of the right lateral ventricle, right uncal herniation, and 4 mm of right-to-left shift present. No hydrocephalus or evidence for ventricular trapping.  2. Stable size of smaller left anterior insular/subinsular ischemic infarct. No evidence for associated hemorrhage or mass effect. 3. No other acute intracranial process.  06/29/2105  1. 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 2. Sparing of the superior right MCA division. This may be secondary to collaterals of the right anterior cerebral artery.  3. Nonhemorrhagic infarction within the basal ganglia.  06/29/2015    No adverse features status post Neurointervention for emergent right ICA terminus occlusion.  No acute or developing right hemisphere infarct identified.   06/29/2015    Mild chronic microvascular ischemia.  No acute abnormality    Ct Angio Head & Neck W/cm &/or Wo/cm 06/29/2015   1. Positive for emergent large vessel occlusion: Occluded right ICA terminus, right MCA and right ACA origins. 7-8 mm occluded segment of the right M1 segment but good right MCA M2 and distal collaterals. Also, the left A1 is dominant such that bilateral A2 and distal ACA flow appears symmetric.   2. Mildly to moderately tortuous cervical ICAs. Mild plaque at the right ICA origin and bulb without stenosis. Minimal calcified right ICA siphon plaque. Moderate plaque at the distal left ICA bulb but without hemodynamically significant stenosis.  3. Proximal left subclavian artery soft plaque without significant stenosis. Negative posterior circulation.  4. Grossly stable CT appearance of the brain since 1001 hours.   Cerebral angiogram   06/29/2015  Bilateral CCA and right vertebral artery arteriograms followed by attempted right ICA terminus and right MCA occlusion using 3 passes of the Sol[employer] [place] stent retrieval device and 8 mg of superselective E Integrilin. Robust crass filling from left ICA versus ACom and R PCA leptomeningeals.  MRI brain  07/01/2015  Large acute right middle cerebral artery distribution infarct involving portions the right temporal lobe, right frontal lobe, right parietal lobe, operculum region, right basal ganglia and right corona radiata with local mass effect upon the right lateral ventricle with slight bowing of the septum to the left by 2 mm. On gradient sequence no associated blood however, slight T1 hyperintensity within the right lenticular nucleus and subtle blood at this level therefore not excluded. Mild right uncal herniation. Nonhemorrhagic acute anterior left subinsular/peri operculum infarct.  Dg Chest Port 1 View 07/02/2015 Endotracheal tube not visualized and has presumably been removed. Cardiomegaly with mild bibasilar atelectasis. 07/02/2015 Endotracheal tube 9 cm from carina. Consider advancement by 2-3 cm. Central venous congestion 06/30/2105 Endotracheal to several cm above the carina as described. This could be advanced of 4-5 cm. 06/29/2105 1. Endotracheal tube tip at the thoracic inlet, interim advancement of approximately 3-4 cm should be considered. NG tube in stable position. The 2. Interim partial clearing of right upper lobe atelectasis. 3. Cardiomegaly with mild pulmonary interstitial prominence. A mild component of congestive heart failure cannot be excluded.  06/29/2015  Endotracheal tube is 6 cm above the carina, recommend advancing 2 cm Right upper lobe collapse    PHYSICAL EXAM  Temp:  [98 F (36.7 C)-99.1 F (37.3 C)] 99 F (37.2 C) (02/12 1400) Pulse Rate:  [81-92] 91 (02/12 1400) Resp:  [16-20] 16 (02/12 1400) BP: (91-128)/(48-82) 128/82 mmHg (02/12 1400) SpO2:  [96  %-100 %] 98 % (02/12 1400)  General - Well nourished, well developed, mildly lethargic.  Ophthalmologic - Fundi not visualized due to noncooperation.  Cardiovascular - Regular rate and rhythm.  Mental Status -  Level of arousal and orientation to time, place, and person were intact. Language including expression, naming, repetition, comprehension was assessed and found intact, but mild dysarthria and whisper. Left neglect. Fund of Knowledge was assessed and was intact.  Cranial Nerves II - XII - II - left visual neglect vs. Left homonymous hemianopia. III, IV, VI - right gaze preference but able to cross midline. V - Facial sensation intact bilaterally. VII - Facial movement showed left facial droop. VIII - Hearing & vestibular intact bilaterally. X - Palate elevates symmetrically, but mild dysarthria and whisper. XI - Chin turning &  shoulder shrug intact bilaterally. XII - Tongue protrusion intact.  Motor Strength - The patient's strength was RUE 0/5, RLE withdraw to pain but not against gravity, LUE 5/5 and LLE 4/5. Bulk was normal and fasciculations were absent.   Motor Tone - Muscle tone was assessed at the neck and appendages and was normal.  Reflexes - The patient's reflexes were 1+ in all extremities and she had no pathological reflexes.  Sensory - Light touch, temperature/pinprick were assessed and were symmetrical.    Coordination - The patient had normal movements in the right hand with no ataxia or dysmetria.  Tremor was absent.  Gait and Station - not tested due to weakness.   ASSESSMENT/PLAN Ms. JAHNYA TRINDADE is a 60 y.o. female with history of infective endocarditis presenting with left arm weakness. She did not receive IV t-PA due to recent endocarditis. She was taken to IR where there was attempted revascularization of the right MCA occlusion using mechanical thrombectomy and intra-arterial Integrilin.   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 Also L subinsular/periopercular infarct  TEE 06/24/2015 MV endocarditis, vegetation both leaflets but trivial MR  LDL 26, at goal < 70  HgbA1c 6.9 in Jan  lovenox subq for VTE prophylaxis DIET DYS 3 Room service appropriate?: Yes; Fluid consistency:: Honey Thick  No antithrombotic prior to admission, now on No antithrombotic due to endocarditis as the cause of stroke  Ongoing aggressive stroke risk factor management  Therapy recommendations:  CIR. Rehab admissions coordinator following  Disposition:  pending   Cytotoxic Cerebral Edema, resolved  Induced hypernatremia  Started on 3% to induce hypernatremia in order to decrease cerebral edema  Very sensitive to 3% - when on drip, Na up, when rate decreased, overall Na rapidly decreases  3% totally off 2/10 am  Na 142 -> 138   MV Endocarditis with splenic infarcts  dx 05/2015 with sepsis from enterococcus UTI, pyelo, bacteremia   on Cipro 750 mg bid since 06/25/15 (planned total 6 weeks treatment with fluoroquinolone, had been on rocephin 1/23 - 1/30 (no abx 1/31))  new CTX 2 mg IV q 24h this admission  Cipro and CTX changed to Primaxin, D#4 -   Changed back to IV Rocephin on Saturday, 07/05/2015.  WBC 15.0-> 12.5->9.1-> 9.7  Has PICC line   Planned OP followup - by ID and Cardiology to monitor  vegetation  Maculopapular Rash with itching, improved  Over thorax  ? Transfusion reaction vs abx reaction. Unsure etiology  Rocephin and Cipro changed to Primaxin, D#4  Treated with Benadryl   Now back to CTX  Acute Blood Loss Anemia  Hgb 9.5 on admission  Received 1 unit. 2nd unit held due to transfusion reaction of T 101.5  Rash following blood transfusion, unclear if related to blood or abx  Hgb 7.3 -> 7.3 -> 7.9  WBC - 9.7  Hypotension  Neo now off   BP running low  On IVF @ 50cc/h  Lung Mass and Liver Mass  1.8 superior segment LLL lung mass and liver mass  Metastasis of unknown Primary vs hamartoma  oncology has not seen pt last admission - appt at [facility] 07/25/15 at 930a  OP PET is pending late Feb.   Diabetes  HgbA1c 6.9, at goal < 7.0  Other Stroke Risk Factors  Smoker - encourage to quit  THC, UDS positive this admission  Obesity, Body mass index is 36.4 kg/(m^2).   Other Active Problems  Hypokalemia - supplement (3.7 on Sunday)  Hospital day # 7   Becky Hawking, MD PhD Stroke Neurology 07/06/2015 6:05 PM    To contact Stroke Continuity provider, please refer to [email or web address] After hours, contact General Neurology

## 2015-07-06 NOTE — Progress Notes (Signed)
Pt reports itching, no rash noted. Benadryl will be given

## 2015-07-07 ENCOUNTER — Inpatient Hospital Stay (HOSPITAL_COMMUNITY)
Admission: RE | Admit: 2015-07-07 | Discharge: 2015-07-31 | DRG: 056 | Disposition: A | Discharge status: Other Acute Inpatient Hospital | Payer: 59 | Source: Intra-hospital | Attending: Physical Medicine & Rehabilitation | Admitting: Physical Medicine & Rehabilitation

## 2015-07-07 ENCOUNTER — Ambulatory Visit (HOSPITAL_COMMUNITY): Payer: 59

## 2015-07-07 DIAGNOSIS — Z23 Encounter for immunization: Secondary | ICD-10-CM | POA: Diagnosis not present

## 2015-07-07 DIAGNOSIS — D649 Anemia, unspecified: Secondary | ICD-10-CM | POA: Diagnosis present

## 2015-07-07 DIAGNOSIS — I63511 Cerebral infarction due to unspecified occlusion or stenosis of right middle cerebral artery: Secondary | ICD-10-CM

## 2015-07-07 DIAGNOSIS — K0889 Other specified disorders of teeth and supporting structures: Secondary | ICD-10-CM | POA: Diagnosis present

## 2015-07-07 DIAGNOSIS — G8194 Hemiplegia, unspecified affecting left nondominant side: Secondary | ICD-10-CM | POA: Diagnosis present

## 2015-07-07 DIAGNOSIS — J189 Pneumonia, unspecified organism: Secondary | ICD-10-CM | POA: Diagnosis not present

## 2015-07-07 DIAGNOSIS — I669 Occlusion and stenosis of unspecified cerebral artery: Secondary | ICD-10-CM

## 2015-07-07 DIAGNOSIS — R1310 Dysphagia, unspecified: Secondary | ICD-10-CM

## 2015-07-07 DIAGNOSIS — N12 Tubulo-interstitial nephritis, not specified as acute or chronic: Secondary | ICD-10-CM

## 2015-07-07 DIAGNOSIS — I340 Nonrheumatic mitral (valve) insufficiency: Secondary | ICD-10-CM | POA: Insufficient documentation

## 2015-07-07 DIAGNOSIS — R0602 Shortness of breath: Secondary | ICD-10-CM | POA: Diagnosis not present

## 2015-07-07 DIAGNOSIS — R0902 Hypoxemia: Secondary | ICD-10-CM | POA: Diagnosis not present

## 2015-07-07 DIAGNOSIS — J449 Chronic obstructive pulmonary disease, unspecified: Secondary | ICD-10-CM | POA: Diagnosis present

## 2015-07-07 DIAGNOSIS — T402X5A Adverse effect of other opioids, initial encounter: Secondary | ICD-10-CM | POA: Diagnosis not present

## 2015-07-07 DIAGNOSIS — D62 Acute posthemorrhagic anemia: Secondary | ICD-10-CM | POA: Diagnosis present

## 2015-07-07 DIAGNOSIS — R918 Other nonspecific abnormal finding of lung field: Secondary | ICD-10-CM | POA: Diagnosis present

## 2015-07-07 DIAGNOSIS — E860 Dehydration: Secondary | ICD-10-CM | POA: Diagnosis present

## 2015-07-07 DIAGNOSIS — I69391 Dysphagia following cerebral infarction: Secondary | ICD-10-CM

## 2015-07-07 DIAGNOSIS — F411 Generalized anxiety disorder: Secondary | ICD-10-CM | POA: Diagnosis present

## 2015-07-07 DIAGNOSIS — I5033 Acute on chronic diastolic (congestive) heart failure: Secondary | ICD-10-CM | POA: Diagnosis not present

## 2015-07-07 DIAGNOSIS — I69398 Other sequelae of cerebral infarction: Secondary | ICD-10-CM | POA: Diagnosis present

## 2015-07-07 DIAGNOSIS — R519 Headache, unspecified: Secondary | ICD-10-CM | POA: Insufficient documentation

## 2015-07-07 DIAGNOSIS — R51 Headache: Secondary | ICD-10-CM

## 2015-07-07 DIAGNOSIS — B9561 Methicillin susceptible Staphylococcus aureus infection as the cause of diseases classified elsewhere: Secondary | ICD-10-CM | POA: Diagnosis present

## 2015-07-07 DIAGNOSIS — R21 Rash and other nonspecific skin eruption: Secondary | ICD-10-CM | POA: Diagnosis present

## 2015-07-07 DIAGNOSIS — I959 Hypotension, unspecified: Secondary | ICD-10-CM

## 2015-07-07 DIAGNOSIS — R209 Unspecified disturbances of skin sensation: Secondary | ICD-10-CM

## 2015-07-07 DIAGNOSIS — I248 Other forms of acute ischemic heart disease: Secondary | ICD-10-CM | POA: Diagnosis not present

## 2015-07-07 DIAGNOSIS — G8929 Other chronic pain: Secondary | ICD-10-CM | POA: Diagnosis present

## 2015-07-07 DIAGNOSIS — G936 Cerebral edema: Secondary | ICD-10-CM | POA: Diagnosis present

## 2015-07-07 DIAGNOSIS — B9689 Other specified bacterial agents as the cause of diseases classified elsewhere: Secondary | ICD-10-CM | POA: Diagnosis not present

## 2015-07-07 DIAGNOSIS — E876 Hypokalemia: Secondary | ICD-10-CM | POA: Diagnosis present

## 2015-07-07 DIAGNOSIS — I69352 Hemiplegia and hemiparesis following cerebral infarction affecting left dominant side: Secondary | ICD-10-CM | POA: Diagnosis present

## 2015-07-07 DIAGNOSIS — L299 Pruritus, unspecified: Secondary | ICD-10-CM | POA: Diagnosis not present

## 2015-07-07 DIAGNOSIS — A419 Sepsis, unspecified organism: Secondary | ICD-10-CM | POA: Diagnosis not present

## 2015-07-07 DIAGNOSIS — F17290 Nicotine dependence, other tobacco product, uncomplicated: Secondary | ICD-10-CM | POA: Diagnosis present

## 2015-07-07 DIAGNOSIS — Z66 Do not resuscitate: Secondary | ICD-10-CM | POA: Diagnosis present

## 2015-07-07 DIAGNOSIS — G441 Vascular headache, not elsewhere classified: Secondary | ICD-10-CM | POA: Diagnosis present

## 2015-07-07 DIAGNOSIS — Y95 Nosocomial condition: Secondary | ICD-10-CM | POA: Diagnosis not present

## 2015-07-07 DIAGNOSIS — I76 Septic arterial embolism: Secondary | ICD-10-CM | POA: Insufficient documentation

## 2015-07-07 DIAGNOSIS — B373 Candidiasis of vulva and vagina: Secondary | ICD-10-CM | POA: Diagnosis present

## 2015-07-07 DIAGNOSIS — H5340 Unspecified visual field defects: Secondary | ICD-10-CM | POA: Diagnosis present

## 2015-07-07 DIAGNOSIS — I330 Acute and subacute infective endocarditis: Secondary | ICD-10-CM | POA: Diagnosis present

## 2015-07-07 DIAGNOSIS — H538 Other visual disturbances: Secondary | ICD-10-CM | POA: Diagnosis present

## 2015-07-07 DIAGNOSIS — I63411 Cerebral infarction due to embolism of right middle cerebral artery: Secondary | ICD-10-CM | POA: Diagnosis not present

## 2015-07-07 DIAGNOSIS — R1312 Dysphagia, oropharyngeal phase: Secondary | ICD-10-CM | POA: Diagnosis present

## 2015-07-07 DIAGNOSIS — I69898 Other sequelae of other cerebrovascular disease: Secondary | ICD-10-CM

## 2015-07-07 DIAGNOSIS — I339 Acute and subacute endocarditis, unspecified: Secondary | ICD-10-CM | POA: Diagnosis not present

## 2015-07-07 DIAGNOSIS — M549 Dorsalgia, unspecified: Secondary | ICD-10-CM | POA: Diagnosis present

## 2015-07-07 DIAGNOSIS — Z8673 Personal history of transient ischemic attack (TIA), and cerebral infarction without residual deficits: Secondary | ICD-10-CM | POA: Diagnosis present

## 2015-07-07 DIAGNOSIS — D638 Anemia in other chronic diseases classified elsewhere: Secondary | ICD-10-CM | POA: Diagnosis present

## 2015-07-07 LAB — GLUCOSE, CAPILLARY
GLUCOSE-CAPILLARY: 163 mg/dL — AB (ref 65–99)
Glucose-Capillary: 121 mg/dL — ABNORMAL HIGH (ref 65–99)
Glucose-Capillary: 136 mg/dL — ABNORMAL HIGH (ref 65–99)
Glucose-Capillary: 156 mg/dL — ABNORMAL HIGH (ref 65–99)
Glucose-Capillary: 91 mg/dL (ref 65–99)

## 2015-07-07 LAB — CBC
HEMATOCRIT: 25.1 % — AB (ref 36.0–46.0)
HEMOGLOBIN: 8 g/dL — AB (ref 12.0–15.0)
MCH: 29.6 pg (ref 26.0–34.0)
MCHC: 31.9 g/dL (ref 30.0–36.0)
MCV: 93 fL (ref 78.0–100.0)
Platelets: 362 10*3/uL (ref 150–400)
RBC: 2.7 MIL/uL — ABNORMAL LOW (ref 3.87–5.11)
RDW: 15 % (ref 11.5–15.5)
WBC: 9.4 10*3/uL (ref 4.0–10.5)

## 2015-07-07 LAB — BASIC METABOLIC PANEL
ANION GAP: 7 (ref 5–15)
BUN: 5 mg/dL — AB (ref 6–20)
CHLORIDE: 103 mmol/L (ref 101–111)
CO2: 25 mmol/L (ref 22–32)
Calcium: 8.4 mg/dL — ABNORMAL LOW (ref 8.9–10.3)
Creatinine, Ser: 0.77 mg/dL (ref 0.44–1.00)
GFR calc Af Amer: >60 mL/min (ref >60)
GLUCOSE: 125 mg/dL — AB (ref 65–99)
POTASSIUM: 3.4 mmol/L — AB (ref 3.5–5.1)
SODIUM: 135 mmol/L (ref 135–145)

## 2015-07-07 MED ORDER — PNEUMOCOCCAL 13-VAL CONJ VACC IM SUSP
0.5000 mL | INTRAMUSCULAR | Status: AC
Start: 2015-07-08 — End: 2015-07-08
  Administered 2015-07-08: 0.5 mL via INTRAMUSCULAR
  Filled 2015-07-07: qty 0.5

## 2015-07-07 MED ORDER — BISACODYL 10 MG RE SUPP: 10.0000 mg | Freq: Every day | RECTAL | Status: DC | PRN

## 2015-07-07 MED ORDER — ALUM & MAG HYDROXIDE-SIMETH 200-200-20 MG/5ML PO SUSP: 30.0000 mL | ORAL | Status: DC | PRN

## 2015-07-07 MED ORDER — PROCHLORPERAZINE EDISYLATE 5 MG/ML IJ SOLN: 5.0000 mg | Freq: Four times a day (QID) | INTRAMUSCULAR | Status: DC | PRN

## 2015-07-07 MED ORDER — METHOCARBAMOL 500 MG PO TABS
500.0000 mg | ORAL_TABLET | Freq: Four times a day (QID) | ORAL | Status: DC | PRN
  Administered 2015-07-08 – 2015-07-24 (×4): 500 mg via ORAL
  Filled 2015-07-07 (×4): qty 1

## 2015-07-07 MED ORDER — SODIUM CHLORIDE 0.9 % IV SOLN: INTRAVENOUS | Status: DC

## 2015-07-07 MED ORDER — ENOXAPARIN SODIUM 40 MG/0.4ML ~~LOC~~ SOLN
40.0000 mg | SUBCUTANEOUS | Status: DC
  Administered 2015-07-07 – 2015-07-30 (×24): 40 mg via SUBCUTANEOUS
  Filled 2015-07-07 (×24): qty 0.4

## 2015-07-07 MED ORDER — ADULT MULTIVITAMIN W/MINERALS CH
1.0000 | ORAL_TABLET | Freq: Every day | ORAL | Status: DC
  Administered 2015-07-08 – 2015-07-30 (×23): 1
  Filled 2015-07-07 (×23): qty 1

## 2015-07-07 MED ORDER — SODIUM CHLORIDE 0.9 % IV SOLN
INTRAVENOUS | Status: DC
  Administered 2015-07-07 – 2015-07-10 (×6): via INTRAVENOUS
  Administered 2015-07-11: 50 mL/h via INTRAVENOUS
  Administered 2015-07-12: 600 mL via INTRAVENOUS
  Administered 2015-07-14 – 2015-07-15 (×2): via INTRAVENOUS

## 2015-07-07 MED ORDER — CEFTRIAXONE SODIUM 2 G IJ SOLR
2.0000 g | Freq: Two times a day (BID) | INTRAMUSCULAR | Status: DC
  Administered 2015-07-07 – 2015-07-30 (×47): 2 g via INTRAVENOUS
  Filled 2015-07-07 (×52): qty 2

## 2015-07-07 MED ORDER — ACETAMINOPHEN 325 MG PO TABS
650.0000 mg | ORAL_TABLET | Freq: Four times a day (QID) | ORAL | Status: DC | PRN
  Administered 2015-07-09 – 2015-07-31 (×13): 650 mg via ORAL
  Filled 2015-07-07 (×14): qty 2

## 2015-07-07 MED ORDER — CHLORHEXIDINE GLUCONATE 0.12% ORAL RINSE (MEDLINE KIT)
15.0000 mL | Freq: Two times a day (BID) | OROMUCOSAL | Status: DC
  Administered 2015-07-11 – 2015-07-30 (×19): 15 mL via OROMUCOSAL

## 2015-07-07 MED ORDER — PROCHLORPERAZINE 25 MG RE SUPP: 12.5000 mg | Freq: Four times a day (QID) | RECTAL | Status: DC | PRN

## 2015-07-07 MED ORDER — DIPHENHYDRAMINE HCL 12.5 MG/5ML PO ELIX: 12.5000 mg | ORAL_SOLUTION | Freq: Four times a day (QID) | ORAL | Status: DC | PRN

## 2015-07-07 MED ORDER — INFLUENZA VAC SPLIT QUAD 0.5 ML IM SUSY
0.5000 mL | PREFILLED_SYRINGE | INTRAMUSCULAR | Status: AC
  Administered 2015-07-08: 0.5 mL via INTRAMUSCULAR
  Filled 2015-07-07: qty 0.5

## 2015-07-07 MED ORDER — FLEET ENEMA 7-19 GM/118ML RE ENEM: 1.0000 | ENEMA | Freq: Once | RECTAL | Status: DC | PRN

## 2015-07-07 MED ORDER — TOPIRAMATE 25 MG PO TABS
25.0000 mg | ORAL_TABLET | Freq: Every day | ORAL | Status: DC
  Administered 2015-07-07: 25 mg via ORAL
  Filled 2015-07-07: qty 1

## 2015-07-07 MED ORDER — GUAIFENESIN-DM 100-10 MG/5ML PO SYRP: 5.0000 mL | ORAL_SOLUTION | Freq: Four times a day (QID) | ORAL | Status: DC | PRN

## 2015-07-07 MED ORDER — POLYSACCHARIDE IRON COMPLEX 150 MG PO CAPS
150.0000 mg | ORAL_CAPSULE | Freq: Every day | ORAL | Status: DC
Start: 2015-07-07 — End: 2015-07-08
  Administered 2015-07-07: 150 mg via ORAL
  Filled 2015-07-07: qty 1

## 2015-07-07 MED ORDER — TRAZODONE HCL 50 MG PO TABS
25.0000 mg | ORAL_TABLET | Freq: Every evening | ORAL | Status: DC | PRN
  Administered 2015-07-17: 50 mg via ORAL
  Filled 2015-07-07: qty 1

## 2015-07-07 MED ORDER — INSULIN ASPART 100 UNIT/ML ~~LOC~~ SOLN
0.0000 [IU] | Freq: Three times a day (TID) | SUBCUTANEOUS | Status: DC
  Administered 2015-07-08 – 2015-07-09 (×4): 2 [IU] via SUBCUTANEOUS
  Administered 2015-07-10: 3 [IU] via SUBCUTANEOUS
  Administered 2015-07-11 – 2015-07-13 (×4): 2 [IU] via SUBCUTANEOUS
  Administered 2015-07-15: 3 [IU] via SUBCUTANEOUS
  Administered 2015-07-16 – 2015-07-21 (×12): 2 [IU] via SUBCUTANEOUS
  Administered 2015-07-23: 3 [IU] via SUBCUTANEOUS
  Administered 2015-07-24 – 2015-07-26 (×4): 2 [IU] via SUBCUTANEOUS

## 2015-07-07 MED ORDER — HYDROXYZINE HCL 10 MG PO TABS
10.0000 mg | ORAL_TABLET | Freq: Four times a day (QID) | ORAL | Status: DC | PRN
  Administered 2015-07-07: 10 mg via ORAL
  Filled 2015-07-07 (×2): qty 1

## 2015-07-07 MED ORDER — SODIUM CHLORIDE 0.9% FLUSH
10.0000 mL | Freq: Two times a day (BID) | INTRAVENOUS | Status: DC
  Administered 2015-07-08 – 2015-07-13 (×4): 10 mL

## 2015-07-07 MED ORDER — ANTISEPTIC ORAL RINSE SOLUTION (CORINZ)
7.0000 mL | Freq: Two times a day (BID) | OROMUCOSAL | Status: DC
  Administered 2015-07-07 – 2015-07-30 (×25): 7 mL via OROMUCOSAL

## 2015-07-07 MED ORDER — RESOURCE THICKENUP CLEAR PO POWD
ORAL | Status: DC | PRN
  Filled 2015-07-07 (×3): qty 125

## 2015-07-07 MED ORDER — SODIUM CHLORIDE 0.9% FLUSH
10.0000 mL | INTRAVENOUS | Status: DC | PRN
  Administered 2015-07-08: 20 mL
  Administered 2015-07-10: 30 mL
  Administered 2015-07-13 (×2): 10 mL
  Administered 2015-07-15: 40 mL
  Administered 2015-07-15 – 2015-07-18 (×6): 10 mL
  Administered 2015-07-19: 30 mL
  Administered 2015-07-19 – 2015-07-20 (×3): 10 mL
  Administered 2015-07-20 – 2015-07-21 (×2): 30 mL
  Administered 2015-07-21: 40 mL
  Administered 2015-07-21 (×2): 10 mL
  Administered 2015-07-22: 30 mL
  Administered 2015-07-22: 40 mL
  Administered 2015-07-23: 30 mL
  Administered 2015-07-23: 10 mL
  Administered 2015-07-24: 30 mL
  Administered 2015-07-24: 10 mL
  Administered 2015-07-25: 30 mL
  Administered 2015-07-27 – 2015-07-28 (×3): 10 mL
  Administered 2015-07-28: 30 mL
  Administered 2015-07-30: 10 mL
  Filled 2015-07-07 (×32): qty 40

## 2015-07-07 MED ORDER — OXYCODONE-ACETAMINOPHEN 5-325 MG PO TABS
1.0000 | ORAL_TABLET | Freq: Four times a day (QID) | ORAL | Status: DC | PRN
Start: 2015-07-07 — End: 2015-07-11
  Administered 2015-07-07 – 2015-07-11 (×9): 1 via ORAL
  Filled 2015-07-07 (×10): qty 1

## 2015-07-07 MED ORDER — PROCHLORPERAZINE MALEATE 5 MG PO TABS: 5.0000 mg | ORAL_TABLET | Freq: Four times a day (QID) | ORAL | Status: DC | PRN

## 2015-07-07 MED ORDER — FLUCONAZOLE 100 MG PO TABS
150.0000 mg | ORAL_TABLET | Freq: Once | ORAL | Status: AC
  Administered 2015-07-07: 150 mg via ORAL
  Filled 2015-07-07: qty 2

## 2015-07-07 NOTE — Progress Notes (Signed)
Received pt. As a transfer from 25 M.Pt. And family has been oriented to the unit routine and protocol.Pt. Has stage 1 on her Rt. Buttocks,EPC has been applied after clean the pt.Safety plan was explained to pt. And family,fall prevention plan was sign by family member and th Therapist, sports.Welcome video was played.Keep monitoring pt. Closely and keep assessing her needs.

## 2015-07-07 NOTE — Progress Notes (Signed)
Charlett Blake, MD Physician Signed Physical Medicine and Rehabilitation Consult Note 07/03/2015 2:56 PM  Related encounter: ED to Hosp-Admission (Discharged) from 06/29/2015 in [place] Collapse All        Physical Medicine and Rehabilitation Consult  Reason for Consult: Left sided weakness with sensory deficits, left neglect, dysphagia. Referring Physician: Dr. Leonie Man.    HPI: Becky Patterson is a 60 y.o. 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 She was admitted on 06/29/15 with acute onset of left sided weakness with slurred speech and left neglect. CT head negative and she underwent CTA brain showed M1 occlusion and Dr. Estanislado Pandy attempted revascularization of the right MCA occlusion with Solitare and IA Integrelin. She required fluid boluses due to hypotension. She developed diffuse rash question due reaction from transfusion v/s antibiotics. Dr. Julio Alm consulted for input and recommended switching to carbapenem and repeat TTE. Follow up CCT with large posterior inferior right MCA infarct with evolution and no hemorrhage. She was weaned off vent on 02/08 and therapy evaluations done today. Patient with resultant left neglect with right gaze preference, dense left hemiparesis with sensory deficits, cognitive deficits and dysphagia. CIR recommended for follow up therapy.  In terms of her oncology follow-up she will be seeing Dr. Jana Hakim and has a PET scan scheduled 07/25/2015.  Patient has been interactive with nursing staff. Discussed with RN. Patient does mouth words. She follows simple commands. Obvious right gaze preference  Review of Systems  Unable to perform ROS: other  Constitutional: Positive for malaise/fatigue.  Eyes: Positive for double vision.  Respiratory: Negative for shortness of breath.  Cardiovascular:  Negative for chest pain.  Gastrointestinal: Negative for abdominal pain.  Musculoskeletal: Positive for myalgias, back pain (back injury 30 years ago) and joint pain (chronic knee pain).  Skin: Negative for rash.  Neurological: Positive for sensory change, speech change and focal weakness. Negative for headaches.      History reviewed. No pertinent past medical history.    Past Surgical History  Procedure Laterality Date  . Back surgery    . Tee without cardioversion N/A 06/24/2015    Procedure: TRANSESOPHAGEAL ECHOCARDIOGRAM (TEE); Surgeon: Larey Dresser, MD; Location: [place]; Service: Cardiovascular; Laterality: N/A;  . Radiology with anesthesia N/A 06/29/2015    Procedure: RADIOLOGY WITH ANESTHESIA; Surgeon: Luanne Bras, MD; Location: [place]; Service: Radiology; Laterality: N/A;    Family History  Problem Relation Age of Onset  . Lung cancer Mother   . Liver cancer Brother   . Heart attack Brother     Social History: Married. 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 Per reports that she has been smoking 3 PPD. She does not have any smokeless tobacco history on file. She reports that she does not drink alcohol or use illicit drugs.    Allergies  Allergen Reactions  . Codeine Nausea And Vomiting    Medications Prior to Admission  Medication Sig Dispense Refill  . acetaminophen (TYLENOL) 500 MG tablet Take 1,000 mg by mouth 2 (two) times daily as needed for mild pain or headache.    . ferrous sulfate 325 (65 FE) MG tablet Take 1 tablet (325 mg total) by mouth 2 (two) times daily with a meal. 30 tablet 3  . furosemide (LASIX) 40 MG tablet Take 1 tablet (40 mg total) by mouth daily. 30 tablet  0  . Hydrocodone-Acetaminophen 5-300 MG TABS Take 1 tablet by mouth every 6 (six) hours as needed (for pain). Reported on 06/15/2015  0  . naproxen sodium (ANAPROX) 220 MG tablet  Take 440 mg by mouth 2 (two) times daily as needed (for pain).    . promethazine (PHENERGAN) 6.25 MG/5ML syrup Take 10 mLs (12.5 mg total) by mouth every 6 (six) hours as needed for nausea or vomiting. 120 mL 0  . traMADol (ULTRAM) 50 MG tablet Take 1-2 tablets (50-100 mg total) by mouth every 6 (six) hours as needed for moderate pain. 30 tablet 0  . [DISCONTINUED] ciprofloxacin (CIPRO) 750 MG tablet Take 1 tablet (750 mg total) by mouth 2 (two) times daily. 70 tablet 0    Home: Home Living Family/patient expects to be discharged to:: Inpatient rehab Living Arrangements: Spouse/significant other Available Help at Discharge: Family, Available 24 hours/day Type of Home: House Home Equipment: None  Functional History: Prior Function Level of Independence: Independent Comments: Worked as Air traffic controller. Functional Status:  Mobility: Bed Mobility Overal bed mobility: Needs Assistance, +2 for physical assistance Bed Mobility: Supine to Sit Supine to sit: Mod assist, +2 for physical assistance, HOB elevated General bed mobility comments: pt does attempt to follow cues for coming to sitting EOB. pt moves R LE off of bed and reached for PT with her R UE. Cues and facilitation for movement of L LE and bringing trunk to sitting.  Transfers Overall transfer level: Needs assistance Equipment used: 2 person hand held assist Transfers: Sit to/from Stand, Stand Pivot Transfers Sit to Stand: Max assist, +2 physical assistance Stand pivot transfers: Total assist, +2 physical assistance General transfer comment: pt does follow directions with R UE and LE, but no active participation in L side. Blocked L knee and utilized pad under hips to come to standing and complete pivot to recliner.       ADL:    Cognition: Cognition Overall Cognitive Status: Impaired/Different from baseline Arousal/Alertness: Awake/alert Orientation Level: Oriented to person, Oriented to place,  Oriented to situation Attention: Sustained Sustained Attention: Impaired Sustained Attention Impairment: Verbal complex, Functional complex Memory: (TBD) Awareness: Impaired Awareness Impairment: Intellectual impairment Problem Solving: (TBD) Safety/Judgment: Impaired Cognition Arousal/Alertness: Lethargic Behavior During Therapy: Flat affect Overall Cognitive Status: Impaired/Different from baseline Difficult to assess due to: Level of arousal, Impaired communication  Blood pressure 131/72, pulse 95, temperature 98.1 F (36.7 C), temperature source Oral, resp. rate 19, height '5\' 2"'$  (1.575 m), weight 92.3 kg (203 lb 7.8 oz), SpO2 93 %. Physical Exam  Constitutional: She is oriented to person, place, and time. She appears well-developed and well-nourished.  HENT:  Head: Normocephalic and atraumatic.  Eyes: Pupils are equal, round, and reactive to light.  Neck: Normal range of motion. Neck supple.  Cardiovascular: Normal rate and regular rhythm.  Respiratory: Effort normal and breath sounds normal. She has no wheezes.  GI: Soft. Bowel sounds are normal. She exhibits no distension. There is no tenderness.  Musculoskeletal: She exhibits edema (LUE).  Neurological: She is alert and oriented to person, place, and time.  Dysphonic--soft voice. Left inattention. Left hemiparesis with sensory deficits. She was able to turn eyes to left field with max cues and kept head midline for exam. Reported double vision in right field and unable to see beyond a foot on the right? Able to follow simple commands without diffuclty  Skin: Skin is warm and dry.  Motor strength is 4/5 in the right deltoid, biceps, triceps, grip, 0/5 in  the left deltoid, biceps, triceps, grip 4/5 in the right hip flexor and knee extensor and ankle dorsiflexor, 0 at the left hip flexor and extensor ankle dorsiflexor. Sensation reduced to pinch left upper extremity does have minimal withdrawal to pinch left lower  extremity KUB every 2 the left, does have a left homonymous hemianopsia   Lab Results Last 24 Hours    Results for orders placed or performed during the hospital encounter of 06/29/15 (from the past 24 hour(s))  Glucose, capillary Status: Abnormal   Collection Time: 07/02/15 3:47 PM  Result Value Ref Range   Glucose-Capillary 137 (H) 65 - 99 mg/dL  Sodium Status: None   Collection Time: 07/02/15 6:47 PM  Result Value Ref Range   Sodium 143 135 - 145 mmol/L  Glucose, capillary Status: Abnormal   Collection Time: 07/02/15 8:04 PM  Result Value Ref Range   Glucose-Capillary 122 (H) 65 - 99 mg/dL  Glucose, capillary Status: Abnormal   Collection Time: 07/02/15 11:20 PM  Result Value Ref Range   Glucose-Capillary 116 (H) 65 - 99 mg/dL  Sodium Status: None   Collection Time: 07/03/15 12:01 AM  Result Value Ref Range   Sodium 144 135 - 145 mmol/L  Glucose, capillary Status: Abnormal   Collection Time: 07/03/15 3:11 AM  Result Value Ref Range   Glucose-Capillary 112 (H) 65 - 99 mg/dL  CBC Status: Abnormal   Collection Time: 07/03/15 5:45 AM  Result Value Ref Range   WBC 12.5 (H) 4.0 - 10.5 K/uL   RBC 2.42 (L) 3.87 - 5.11 MIL/uL   Hemoglobin 7.3 (L) 12.0 - 15.0 g/dL   HCT 22.6 (L) 36.0 - 46.0 %   MCV 93.4 78.0 - 100.0 fL   MCH 30.2 26.0 - 34.0 pg   MCHC 32.3 30.0 - 36.0 g/dL   RDW 14.4 11.5 - 15.5 %   Platelets 355 150 - 400 K/uL  Basic metabolic panel Status: Abnormal   Collection Time: 07/03/15 5:45 AM  Result Value Ref Range   Sodium 143 135 - 145 mmol/L   Potassium 3.4 (L) 3.5 - 5.1 mmol/L   Chloride 107 101 - 111 mmol/L   CO2 25 22 - 32 mmol/L   Glucose, Bld 127 (H) 65 - 99 mg/dL   BUN 11 6 - 20 mg/dL   Creatinine, Ser 0.76 0.44 - 1.00 mg/dL   Calcium 8.6 (L) 8.9 - 10.3 mg/dL   GFR calc non  Af Amer >60 >60 mL/min   GFR calc Af Amer >60 >60 mL/min   Anion gap 11 5 - 15  Phosphorus Status: None   Collection Time: 07/03/15 5:45 AM  Result Value Ref Range   Phosphorus 3.3 2.5 - 4.6 mg/dL  Magnesium Status: None   Collection Time: 07/03/15 5:45 AM  Result Value Ref Range   Magnesium 1.8 1.7 - 2.4 mg/dL  Glucose, capillary Status: Abnormal   Collection Time: 07/03/15 8:12 AM  Result Value Ref Range   Glucose-Capillary 114 (H) 65 - 99 mg/dL  Glucose, capillary Status: Abnormal   Collection Time: 07/03/15 11:54 AM  Result Value Ref Range   Glucose-Capillary 120 (H) 65 - 99 mg/dL      Imaging Results (Last 48 hours)    Ct Head Wo Contrast  07/02/2015 CLINICAL DATA: Follow-up examination for stroke. EXAM: CT HEAD WITHOUT CONTRAST TECHNIQUE: Contiguous axial images were obtained from the base of the skull through the vertex without intravenous contrast. COMPARISON: Prior CT from 06/29/2014 as well as prior MRI  from 06/30/2014. FINDINGS: Extensive cytotoxic edema seen throughout the right MCA territory, consistent with large acute right MCA territory infarct. Again, there is involvement of the right frontal, right parietal, and right temporal lobes, with involvement of the basal ganglia. Overall, distribution is stable from recent MRI. No evidence for hemorrhagic transformation on this examination. Persistent hyperdensity within the right M1 segment and right MCA at the sylvian fissure likely representing thrombosis. There is localized edema with partial effacement of the right lateral ventricle, similar to prior MRI. Mild bowing of the septum pellucidum from right-to-left measures up to 4 mm. Right uncal herniation, similar to previous. Basilar cisterns minimally crowded at the level of the right uncus, but otherwise patent. No hydrocephalus or evidence for ventricular trapping. Cytotoxic edema within the anterior  insular/subinsular region on the left also similar relative to previous MRI. No significant mass effect or evidence for hemorrhagic transformation. No new infarct. No extra-axial fluid collection. No acute abnormality about the orbits. Scalp soft tissues within normal limits. Mild mucosal thickening within the ethmoidal air cells, maxillary sinuses, and sphenoid sinuses. Paranasal sinuses are otherwise clear. Trace opacity within the right mastoid air cells. Calvarium intact. IMPRESSION: 1. Continued interval evolution of large ischemic right MCA territory infarct, overall stable in size and distribution relative to recent MRI. No evidence for hemorrhagic transformation. Localized mass effect with effacement of the right lateral ventricle, right uncal herniation, and 4 mm of right-to-left shift present. No hydrocephalus or evidence for ventricular trapping. 2. Stable size of smaller left anterior insular/subinsular ischemic infarct. No evidence for associated hemorrhage or mass effect. 3. No other acute intracranial process. Electronically Signed By: Jeannine Boga M.D. On: 07/02/2015 04:35   Mr Brain Wo Contrast  07/01/2015 CLINICAL DATA: 60 year old female with infective endocarditis on treatment with sudden onset of left-sided weakness. Post angiogram and endovascular treatment for right internal carotid artery terminus occlusion. Subsequent encounter. EXAM: MRI HEAD WITHOUT CONTRAST TECHNIQUE: Multiplanar, multiecho pulse sequences of the brain and surrounding structures were obtained without intravenous contrast. COMPARISON: 06/30/2015 head CT. No comparison brain MR. FINDINGS: Exam is motion degraded. Large acute right middle cerebral artery distribution infarct involving portions the right temporal lobe, right frontal lobe, right parietal lobe, operculum region, right basal ganglia and right corona radiata with local mass effect upon the right lateral ventricle with slight bowing of the  septum to the left by 2 mm. On gradient sequence no associated blood however, slight T1 hyperintensity within the right lenticular nucleus and subtle blood at this level therefore not excluded. Mild right uncal herniation Nonhemorrhagic acute anterior left subinsular/peri operculum infarct. Mild small vessel disease changes. No intracranial mass lesion noted on this unenhanced exam. Cerebellar tonsils slightly low lying but within the range of normal limits. Decreased signal intensity of bone marrow may be related to patient's habitus or anemia. At the level of the sella, major intracranial vascular structures are patent. Limited evaluation right middle cerebral artery branches. IMPRESSION: Large acute right middle cerebral artery distribution infarct involving portions the right temporal lobe, right frontal lobe, right parietal lobe, operculum region, right basal ganglia and right corona radiata with local mass effect upon the right lateral ventricle with slight bowing of the septum to the left by 2 mm. On gradient sequence no associated blood however, slight T1 hyperintensity within the right lenticular nucleus and subtle blood at this level therefore not excluded. Mild right uncal herniation. Nonhemorrhagic acute anterior left subinsular/peri operculum infarct. Electronically Signed By: Genia Del M.D. On: 07/01/2015 16:34  Dg Chest Port 1 View  07/03/2015 CLINICAL DATA: Endotracheal tube. EXAM: PORTABLE CHEST 1 VIEW COMPARISON: 07/02/2015 FINDINGS: Endotracheal tube not visualized and has presumably been removed. A right-sided PICC line terminates at the mid to low SVC. Midline trachea. Cardiomegaly accentuated by AP portable technique. No pleural effusion or pneumothorax. Improved to resolved interstitial edema. Pulmonary interstitial prominence is favored to be related to AP portable technique. Similar bibasilar atelectasis. IMPRESSION: Endotracheal tube not visualized and has presumably been  removed. Cardiomegaly with mild bibasilar atelectasis. Electronically Signed By: Abigail Miyamoto M.D. On: 07/03/2015 07:54   Dg Chest Port 1 View  07/02/2015 CLINICAL DATA: Endotracheal tube EXAM: PORTABLE CHEST 1 VIEW COMPARISON: None. FINDINGS: Endotracheal 9 cm from carina. Consider advancement by 2-3 cm. Stable enlarged cardiac silhouette. There is central venous congestion similar prior. No pneumothorax. IMPRESSION: Endotracheal tube 9 cm from carina. Consider advancement by 2-3 cm. Central venous congestion Electronically Signed By: Suzy Bouchard M.D. On: 07/02/2015 07:43   Dg Swallowing Func-speech Pathology  07/03/2015 Objective Swallowing Evaluation: Type of Study: MBS-Modified Barium Swallow Study Patient Details Name: ARITA SEVERTSON MRN: [id number] Date of Birth: [birth year] Today's Date: 07/03/2015 Time: SLP Start Time (ACUTE ONLY): 1346-SLP Stop Time (ACUTE ONLY): 1357 SLP Time Calculation (min) (ACUTE ONLY): 11 min Past Medical History: No past medical history on file. Past Surgical History: Past Surgical History Procedure Laterality Date . Back surgery . Tee without cardioversion N/A 06/24/2015 Procedure: TRANSESOPHAGEAL ECHOCARDIOGRAM (TEE); Surgeon: Larey Dresser, MD; Location: [place]; Service: Cardiovascular; Laterality: N/A; . Radiology with anesthesia N/A 06/29/2015 Procedure: RADIOLOGY WITH ANESTHESIA; Surgeon: Luanne Bras, MD; Location: [place]; Service: Radiology; Laterality: N/A; HPI: Becky Patterson is a 60 y.o. female history of infective endocarditis that was treated with discharge on 2/2, continuing on antibiotics at home, lung mass noted on that admission. Re-admitted with left sided weakness that progressed after admission. MRI: Large acute right middle cerebral artery distribution infarct No Data Recorded Assessment / Plan / Recommendation CHL IP CLINICAL IMPRESSIONS 07/03/2015 Therapy Diagnosis Moderate pharyngeal phase dysphagia;Moderate oral phase  dysphagia Clinical Impression Patient presents with a moderate oropharyngeal dysphagia characterized by combination sensory deficit and CN VII and XII impairments resulting in decreased oral control of bolus and delayed swallow initiation. Combination of above leads to silent aspiration of liquids thinner than honey thick. Patient able to protect airway with solids and honey thick liquids provided with mild vallecular residuals remaining post swallow due to base of tongue weakness, clearing with cued dry swallow. Recommend initiation of conservative diet with close SLP f/u for tolerance and potential to advance which is good with improved alertness and neuro functioning. Impact on safety and function Moderate aspiration risk CHL IP TREATMENT RECOMMENDATION 07/03/2015 Treatment Recommendations Therapy as outlined in treatment plan below Prognosis 07/03/2015 Prognosis for Safe Diet Advancement Good Barriers to Reach Goals -- Barriers/Prognosis Comment -- CHL IP DIET RECOMMENDATION 07/03/2015 SLP Diet Recommendations Dysphagia 3 (Mech soft) solids;Honey thick liquids Liquid Administration via Cup;No straw Medication Administration Crushed with puree Compensations Slow rate;Small sips/bites;Multiple dry swallows after each bite/sip;Lingual sweep for clearance of pocketing Postural Changes Seated upright at 90 degrees CHL IP OTHER RECOMMENDATIONS 07/03/2015 Recommended Consults -- Oral Care Recommendations Oral care BID Other Recommendations Order thickener from pharmacy;Prohibited food (jello, ice cream, thin soups);Remove water pitcher CHL IP FOLLOW UP RECOMMENDATIONS 07/03/2015 Follow up Recommendations Inpatient Rehab CHL IP FREQUENCY AND DURATION 07/03/2015 Speech Therapy Frequency (ACUTE ONLY) min 3x week Treatment Duration 2 weeks CHL IP ORAL PHASE 07/03/2015 Oral Phase  Impaired Oral - Pudding Teaspoon -- Oral - Pudding Cup -- Oral - Honey Teaspoon Lingual/palatal residue;Delayed oral transit;Decreased bolus  cohesion Oral - Honey Cup Lingual/palatal residue;Delayed oral transit;Decreased bolus cohesion Oral - Nectar Teaspoon Lingual/palatal residue;Delayed oral transit;Decreased bolus cohesion Oral - Nectar Cup -- Oral - Nectar Straw -- Oral - Thin Teaspoon Lingual/palatal residue;Delayed oral transit;Decreased bolus cohesion Oral - Thin Cup -- Oral - Thin Straw -- Oral - Puree Lingual/palatal residue;Delayed oral transit Oral - Mech Soft Lingual/palatal residue;Delayed oral transit Oral - Regular -- Oral - Multi-Consistency -- Oral - Pill -- Oral Phase - Comment -- CHL IP PHARYNGEAL PHASE 07/03/2015 Pharyngeal Phase Impaired Pharyngeal- Pudding Teaspoon -- Pharyngeal -- Pharyngeal- Pudding Cup -- Pharyngeal -- Pharyngeal- Honey Teaspoon Delayed swallow initiation-vallecula;Pharyngeal residue - valleculae;Reduced tongue base retraction Pharyngeal -- Pharyngeal- Honey Cup Delayed swallow initiation-vallecula;Pharyngeal residue - valleculae;Reduced tongue base retraction Pharyngeal -- Pharyngeal- Nectar Teaspoon Pharyngeal residue - valleculae;Reduced tongue base retraction;Delayed swallow initiation-pyriform sinuses;Penetration/Aspiration before swallow Pharyngeal Material enters airway, passes BELOW cords without attempt by patient to eject out (silent aspiration) Pharyngeal- Nectar Cup -- Pharyngeal -- Pharyngeal- Nectar Straw -- Pharyngeal -- Pharyngeal- Thin Teaspoon Pharyngeal residue - valleculae;Reduced tongue base retraction;Delayed swallow initiation-pyriform sinuses;Penetration/Aspiration before swallow Pharyngeal Material enters airway, passes BELOW cords without attempt by patient to eject out (silent aspiration) Pharyngeal- Thin Cup -- Pharyngeal -- Pharyngeal- Thin Straw -- Pharyngeal -- Pharyngeal- Puree Delayed swallow initiation-vallecula;Pharyngeal residue - valleculae;Reduced tongue base retraction Pharyngeal -- Pharyngeal- Mechanical Soft Delayed swallow initiation-vallecula;Pharyngeal residue -  valleculae;Reduced tongue base retraction Pharyngeal -- Pharyngeal- Regular -- Pharyngeal -- Pharyngeal- Multi-consistency -- Pharyngeal -- Pharyngeal- Pill -- Pharyngeal -- Pharyngeal Comment -- CHL IP CERVICAL ESOPHAGEAL PHASE 07/03/2015 Cervical Esophageal Phase WFL Pudding Teaspoon -- Pudding Cup -- Honey Teaspoon -- Honey Cup -- Nectar Teaspoon -- Nectar Cup -- Nectar Straw -- Thin Teaspoon -- Thin Cup -- Thin Straw -- Puree -- Mechanical Soft -- Regular -- Multi-consistency -- Pill -- Cervical Esophageal Comment -- No flowsheet data found. Gabriel Rainwater MA, CCC-SLP [phone] Gabriel Rainwater Meryl 07/03/2015, 2:51 PM     Assessment/Plan: Diagnosis: Right MCA infarct with left hemiplegia, dysphasia, dysarthria, dysphagia, homonymous hemianopsia, hemisensory deficits 1. Does the need for close, 24 hr/day medical supervision in concert with the patient's rehab needs make it unreasonable for this patient to be served in a less intensive setting? Yes 2. Co-Morbidities requiring supervision/potential complications: History of lung mass, mitral valve endocarditis with sepsis 3. Due to bladder management, bowel management, safety, skin/wound care, disease management, medication administration, pain management and patient education, does the patient require 24 hr/day rehab nursing? Yes 4. Does the patient require coordinated care of a physician, rehab nurse, PT (1-2 hrs/day, 5 days/week), OT (1-2 hrs/day, 55 days/week) and SLP (0.5-1 hrs/day, 5 days/week) to address physical and functional deficits in the context of the above medical diagnosis(es)? Yes Addressing deficits in the following areas: balance, endurance, locomotion, strength, transferring, bowel/bladder control, bathing, dressing, feeding, grooming, toileting, cognition, speech, language, swallowing and psychosocial support 5. Can the patient actively participate in an intensive therapy program of at least 3 hrs of therapy per day at least 5  days per week? Yes 6. The potential for patient to make measurable gains while on inpatient rehab is good 7. Anticipated functional outcomes upon discharge from inpatient rehab are min assist with PT, min assist with OT, min assist with SLP. 8. Estimated rehab length of stay to reach the above functional goals is: 18-21 days 9. Does the patient have  adequate social supports and living environment to accommodate these discharge functional goals? Potentially 10. Anticipated D/C setting: Home 11. Anticipated post D/C treatments: [place] therapy 12. Overall Rehab/Functional Prognosis: good  RECOMMENDATIONS: This patient's condition is appropriate for continued rehabilitative care in the following setting: CIR Patient has agreed to participate in recommended program. Potentially Note that insurance prior authorization may be required for reimbursement for recommended care.  Comment:     07/03/2015       Revision History     Date/Time User Provider Type Action   07/03/2015 4:58 PM Charlett Blake, MD Physician Sign   07/03/2015 3:51 PM Bary Leriche, PA-C Physician Assistant Share   View Details Report       Routing History     Date/Time From To Method   07/03/2015 4:58 PM Charlett Blake, MD Charlett Blake, MD [facility] [facility]

## 2015-07-07 NOTE — H&P (View-Only) (Signed)
Physical Medicine and Rehabilitation Admission H&P    Chief Complaint  Patient presents with  . Left sided weakness with sensory deficits, left neglect, dysphagia    HPI:   Becky Patterson is a 60 y.o. 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 She was admitted on 06/29/15 with acute onset of left sided weakness with slurred speech and left neglect. UDS positive for THC. CT head negative and she underwent CTA brain showed M1 occlusion and Dr. Estanislado Pandy attempted revascularization of the right MCA occlusion with Solitare and IA Integrelin. She required fluid boluses due to hypotension. She developed diffuse rash question due reaction from transfusion v/s antibiotics. Dr. Julio Alm consulted for input and recommended switching to carbapenem and repeat TTE at end of course of antibiotics. Follow up CCT with large posterior inferior right MCA infarct with evolution and no hemorrhage. MRI brain showed large R-MCA infarct with local mass effect on right lateral ventricle and mild right uncal herniation as well as non hemorrhagic left subinsular/ perioperculum infarct. Dr Leonie Man feels that infarct due to septic emboli and no antithrombotic due to risk of bleeding. She was weaned off vent on 02/08 and MBS done  for swallow evaluation. Se was started on dysphagia 3, honey liquids due to moderate oropharyngeal dysphagia with aspiration of liquids thinner than honey. Maculopapular Rash has resolved and she was switched back to Ceftriaxone. Patient with resultant left neglect with right gaze preference, dense left hemiparesis with sensory deficits, cognitive deficits and dysphagia. CIR recommended for follow up therapy   Review of Systems  HENT: Negative for hearing loss.   Eyes: Negative for blurred vision and double vision.  Respiratory: Negative for cough and shortness of breath.   Cardiovascular:  Negative for chest pain and palpitations.  Gastrointestinal: Negative for heartburn, nausea and abdominal pain.  Musculoskeletal: Positive for neck pain. Negative for myalgias and back pain.  Neurological: Positive for sensory change, weakness and headaches (all the time). Negative for dizziness and tingling.  Psychiatric/Behavioral: Positive for memory loss. The patient does not have insomnia.   All other systems reviewed and are negative.     Past Medical History  Diagnosis Date  . Lung mass   . Bacterial endocarditis   . Splenic infarction   . Epistaxis     Past Surgical History  Procedure Laterality Date  . Back surgery    . Tee without cardioversion N/A 06/24/2015    Procedure: TRANSESOPHAGEAL ECHOCARDIOGRAM (TEE);  Surgeon: Larey Dresser, MD;  Location: [facility];  Service: Cardiovascular;  Laterality: N/A;  . Radiology with anesthesia N/A 06/29/2015    Procedure: RADIOLOGY WITH ANESTHESIA;  Surgeon: Luanne Bras, MD;  Location: [facility];  Service: Radiology;  Laterality: N/A;  . Cholecystectomy      Family History  Problem Relation Age of Onset  . Lung cancer Mother   . Liver cancer Brother   . Heart attack Brother     Social History:  Married. Self employed Air traffic controller.  Husband is a Administrator.  Family reports that she smokes 3 PPD.  Per reports she does not use alcohol or illicit drugs.    Allergies  Allergen Reactions  . Codeine Nausea And Vomiting    Medications Prior to Admission  Medication Sig Dispense Refill  . acetaminophen (TYLENOL) 500 MG tablet Take 1,000 mg by mouth 2 (two) times daily as needed for mild pain or headache.    Marland Kitchen  ferrous sulfate 325 (65 FE) MG tablet Take 1 tablet (325 mg total) by mouth 2 (two) times daily with a meal. 30 tablet 3  . furosemide (LASIX) 40 MG tablet Take 1 tablet (40 mg total) by mouth daily. 30 tablet 0  . Hydrocodone-Acetaminophen 5-300 MG TABS Take 1 tablet by mouth every 6 (six) hours as needed (for pain).  Reported on 06/15/2015  0  . naproxen sodium (ANAPROX) 220 MG tablet Take 440 mg by mouth 2 (two) times daily as needed (for pain).    . promethazine (PHENERGAN) 6.25 MG/5ML syrup Take 10 mLs (12.5 mg total) by mouth every 6 (six) hours as needed for nausea or vomiting. 120 mL 0  . traMADol (ULTRAM) 50 MG tablet Take 1-2 tablets (50-100 mg total) by mouth every 6 (six) hours as needed for moderate pain. 30 tablet 0  . [DISCONTINUED] ciprofloxacin (CIPRO) 750 MG tablet Take 1 tablet (750 mg total) by mouth 2 (two) times daily. 70 tablet 0    Home: Home Living Family/patient expects to be discharged to:: Inpatient rehab Living Arrangements: Spouse/significant other Available Help at Discharge: Family, Available 24 hours/day Type of Home: House Home Equipment: None Additional Comments: Per family pt was very independent PTA    Functional History: Prior Function Level of Independence: Independent Comments: Worked as Air traffic controller.  Functional Status:  Mobility: Bed Mobility Overal bed mobility: Needs Assistance, +2 for physical assistance Bed Mobility: Sit to Supine Supine to sit: Max assist Sit to supine: Max assist General bed mobility comments: Pt able to assist with moving Rt LE off bed and with lifting trunk.  She did assist with lowering trunk to bed and minimally to lift LEs onto the bed  Transfers Overall transfer level: Needs assistance Equipment used: 2 person hand held assist Transfers: Sit to/from Stand, Stand Pivot Transfers Sit to Stand: Max assist, +2 physical assistance Stand pivot transfers: Total assist, +2 physical assistance General transfer comment: did not attempt       ADL: ADL Overall ADL's : Needs assistance/impaired Eating/Feeding: Minimal assistance, Sitting Eating/Feeding Details (indicate cue type and reason): see with SLP.  Pt fed self spoonfuls of honey thick liquids with assist to stabilize the cup.  She is able to drink from cup with supervision    Grooming: Wash/dry hands, Wash/dry face, Moderate assistance, Sitting Upper Body Bathing: Maximal assistance, Sitting Lower Body Bathing: Total assistance, Sitting/lateral leans, Bed level Upper Body Dressing : Maximal assistance, Sitting Lower Body Dressing: Total assistance, Sitting/lateral leans, Bed level Toilet Transfer: Total assistance, Squat-pivot Toileting- Clothing Manipulation and Hygiene: Total assistance, Sitting/lateral lean, Bed level Functional mobility during ADLs: Total assistance General ADL Comments: spouse present during session and is very supportive of pt.   Cognition: Cognition Overall Cognitive Status: Impaired/Different from baseline Arousal/Alertness: Awake/alert Orientation Level: Oriented X4 Attention: Sustained Sustained Attention: Impaired Sustained Attention Impairment: Verbal complex, Functional complex Memory:  (TBD) Awareness: Impaired Awareness Impairment: Intellectual impairment Problem Solving:  (TBD) Safety/Judgment: Impaired Cognition Arousal/Alertness: Awake/alert Behavior During Therapy: Flat affect Overall Cognitive Status: Impaired/Different from baseline Area of Impairment: Attention, Following commands, Safety/judgement, Awareness, Problem solving Current Attention Level: Sustained, Selective Following Commands: Follows one step commands consistently Safety/Judgement: Decreased awareness of deficits, Decreased awareness of safety Awareness: Intellectual Problem Solving: Decreased initiation, Difficulty sequencing, Requires verbal cues, Requires tactile cues, Slow processing General Comments: Pt demonstrates sustained and at times selective attention.  She becomes distracted by items on her Rt or with concern about spouse's location, but is redirected back to  task with min cues.  She is able to tell therapist the events of the day.  She is aware she has had a stroke, but has poor awareness of her deficits and their impact on her  functionally  Difficult to assess due to: Level of arousal, Impaired communication   Blood pressure 121/56, pulse 99, temperature 98.6 F (37 C), temperature source Oral, resp. rate 18, height '5\' 2"'  (1.575 m), weight 92.08 kg (203 lb), SpO2 94 %. Physical Exam  Nursing note and vitals reviewed. Constitutional: She is oriented to person, place, and time. She appears well-developed and well-nourished.  HENT:  Head: Normocephalic and atraumatic.  Mouth/Throat: Oropharynx is clear and moist.  Eyes: Conjunctivae are normal. Pupils are equal, round, and reactive to light. No scleral icterus.  Neck: Normal range of motion. Neck supple.  Cardiovascular: Normal rate and regular rhythm.   Respiratory: Effort normal and breath sounds normal.  GI: Soft. Bowel sounds are normal. There is tenderness.  Musculoskeletal: She exhibits edema. She exhibits no tenderness.  Neurological: She is alert and oriented to person, place, and time.  Dysarthric speech with left facial weakness and left sided tongue deviation.  Right gaze preference and unable to move eyes beyond mild line.  Motor: LUE/LLE: 0/5 R UE/RLE 5/5 proximal distal LLE: Clonus DTRs symmetric Sensation diminished light touch le upper and left lower extremity  Skin: Skin is warm and dry.  Psychiatric: Her affect is angry and blunt. She expresses inappropriate judgment.   Results for orders placed or performed during the hospital encounter of 06/29/15 (from the past 48 hour(s))  Glucose, capillary     Status: Abnormal   Collection Time: 07/05/15 11:28 AM  Result Value Ref Range   Glucose-Capillary 149 (H) 65 - 99 mg/dL  Glucose, capillary     Status: Abnormal   Collection Time: 07/05/15  4:21 PM  Result Value Ref Range   Glucose-Capillary 155 (H) 65 - 99 mg/dL  Glucose, capillary     Status: Abnormal   Collection Time: 07/05/15  8:16 PM  Result Value Ref Range   Glucose-Capillary 141 (H) 65 - 99 mg/dL  Basic metabolic panel      Status: Abnormal   Collection Time: 07/06/15  2:15 AM  Result Value Ref Range   Sodium 138 135 - 145 mmol/L   Potassium 3.7 3.5 - 5.1 mmol/L   Chloride 103 101 - 111 mmol/L   CO2 26 22 - 32 mmol/L   Glucose, Bld 115 (H) 65 - 99 mg/dL   BUN 8 6 - 20 mg/dL   Creatinine, Ser 0.75 0.44 - 1.00 mg/dL   Calcium 8.4 (L) 8.9 - 10.3 mg/dL   GFR calc non Af Amer >60 >60 mL/min   GFR calc Af Amer >60 >60 mL/min    Comment: (NOTE) The eGFR has been calculated using the CKD EPI equation. This calculation has not been validated in all clinical situations. eGFR's persistently <60 mL/min signify possible Chronic Kidney Disease.    Anion gap 9 5 - 15  CBC     Status: Abnormal   Collection Time: 07/06/15  2:15 AM  Result Value Ref Range   WBC 9.7 4.0 - 10.5 K/uL   RBC 2.74 (L) 3.87 - 5.11 MIL/uL   Hemoglobin 7.9 (L) 12.0 - 15.0 g/dL   HCT 25.2 (L) 36.0 - 46.0 %   MCV 92.0 78.0 - 100.0 fL   MCH 28.8 26.0 - 34.0 pg   MCHC 31.3 30.0 - 36.0 g/dL  RDW 14.7 11.5 - 15.5 %   Platelets 363 150 - 400 K/uL  Glucose, capillary     Status: Abnormal   Collection Time: 07/06/15  6:34 AM  Result Value Ref Range   Glucose-Capillary 131 (H) 65 - 99 mg/dL   Comment 1 Notify RN    Comment 2 Document in Chart   Glucose, capillary     Status: Abnormal   Collection Time: 07/06/15 11:31 AM  Result Value Ref Range   Glucose-Capillary 126 (H) 65 - 99 mg/dL  Glucose, capillary     Status: Abnormal   Collection Time: 07/06/15  4:30 PM  Result Value Ref Range   Glucose-Capillary 142 (H) 65 - 99 mg/dL  Glucose, capillary     Status: Abnormal   Collection Time: 07/07/15 12:03 AM  Result Value Ref Range   Glucose-Capillary 156 (H) 65 - 99 mg/dL   Comment 1 Notify RN    Comment 2 Document in Chart   Basic metabolic panel     Status: Abnormal   Collection Time: 07/07/15  5:38 AM  Result Value Ref Range   Sodium 135 135 - 145 mmol/L   Potassium 3.4 (L) 3.5 - 5.1 mmol/L   Chloride 103 101 - 111 mmol/L   CO2 25  22 - 32 mmol/L   Glucose, Bld 125 (H) 65 - 99 mg/dL   BUN 5 (L) 6 - 20 mg/dL   Creatinine, Ser 0.77 0.44 - 1.00 mg/dL   Calcium 8.4 (L) 8.9 - 10.3 mg/dL   GFR calc non Af Amer >60 >60 mL/min   GFR calc Af Amer >60 >60 mL/min    Comment: (NOTE) The eGFR has been calculated using the CKD EPI equation. This calculation has not been validated in all clinical situations. eGFR's persistently <60 mL/min signify possible Chronic Kidney Disease.    Anion gap 7 5 - 15  CBC     Status: Abnormal   Collection Time: 07/07/15  5:38 AM  Result Value Ref Range   WBC 9.4 4.0 - 10.5 K/uL   RBC 2.70 (L) 3.87 - 5.11 MIL/uL   Hemoglobin 8.0 (L) 12.0 - 15.0 g/dL   HCT 25.1 (L) 36.0 - 46.0 %   MCV 93.0 78.0 - 100.0 fL   MCH 29.6 26.0 - 34.0 pg   MCHC 31.9 30.0 - 36.0 g/dL   RDW 15.0 11.5 - 15.5 %   Platelets 362 150 - 400 K/uL  Glucose, capillary     Status: Abnormal   Collection Time: 07/07/15  8:42 AM  Result Value Ref Range   Glucose-Capillary 136 (H) 65 - 99 mg/dL   Dg Chest Port 1 View  07/03/2015  CLINICAL DATA:  Endotracheal tube. EXAM: PORTABLE CHEST 1 VIEW COMPARISON:  07/02/2015 FINDINGS: Endotracheal tube not visualized and has presumably been removed. A right-sided PICC line terminates at the mid to low SVC. Midline trachea. Cardiomegaly accentuated by AP portable technique. No pleural effusion or pneumothorax. Improved to resolved interstitial edema. Pulmonary interstitial prominence is favored to be related to AP portable technique. Similar bibasilar atelectasis. IMPRESSION: Endotracheal tube not visualized and has presumably been removed. Cardiomegaly with mild bibasilar atelectasis. Electronically Signed   By: Abigail Miyamoto M.D.   On: 07/03/2015 07:54    Medical Problem List and Plan: 1.  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 No antithrombotic needed.  2.  DVT Prophylaxis/Anticoagulation: Pharmaceutical: Lovenox 3. Pain  Management: Has chronic back pain per family. Will  use tylenol prn for now.  4. Mood: LCSW to follow for evaluation and support.  5. Neuropsych: This patient is not fully capable of making decisions on her own behalf. 6. Skin/Wound Care: routine pressure relief measures.  7. Fluids/Electrolytes/Nutrition: Monitor I/O. Getting dehydrated with honey liquids. Will need IVF at nights and need to encourage fluid intake during the day. Check lytes in am.  8. 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 PET scan on outpatient basis. 10. Hypokalemia: K+ 3.4 this am. Check labs this pm and again in am 11. Cytotoxic edema: Weaned of hypertonic solution.   12. Maculopapular rash:  Likely due to antibiotics per ID and itching being managed with prn benadryl. Antibiotics narrowed to Ceftriaxone on 02/11--monitor for now. IF rash recurs will need to change back to imipenem.    Antibiotic D # 24--End date March 7th.  13. Anemia: Hgb slowly improving. Up to 8 today.  14. Hypotension: Cont to monitor 15. Headache: Will start topamax  Post Admission Physician Evaluation: 1. Functional deficits secondary  to right MCA infarct. 2. Patient is admitted to receive collaborative, interdisciplinary care between the physiatrist, rehab nursing staff, and therapy team. 3. Patient's level of medical complexity and substantial therapy needs in context of that medical necessity cannot be provided at a lesser intensity of care such as a SNF. 4. Patient has experienced substantial functional loss from his/her baseline which was documented above under the "Functional History" and "Functional Status" headings.  Judging by the patient's diagnosis, physical exam, and functional history, the patient has potential for functional progress which will result in measurable gains while on inpatient rehab.  These gains will be of substantial and practical use  upon discharge  in facilitating mobility and self-care at the household level. 5. Physiatrist will provide 24 hour management of medical needs as well as oversight of the therapy plan/treatment and provide guidance as appropriate regarding the interaction of the two. 6. 24 hour rehab nursing will assist with bladder management, bowel management, safety, skin/wound care, disease management, medication administration, pain management and patient education and help integrate therapy concepts, techniques,education, etc. 7. PT will assess and treat for/with: Lower extremity strength, range of motion, stamina, balance, functional mobility, safety, adaptive techniques and equipment, coping skills, pain control, stroke education.   Goals are: Min A. 8. OT will assess and treat for/with: ADL's, functional mobility, safety, upper extremity strength, adaptive techniques and equipment, ego support, and community reintegration.   Goals are: Min A. Therapy may proceed with showering this patient. 9. SLP will assess and treat for/with: swallowing and higher level cognition.  Goals are: Mod I/Ind. 10. Case Management and Social Worker will assess and treat for psychological issues and discharge planning. 11. Team conference will be held weekly to assess progress toward goals and to determine barriers to discharge. 12. Patient will receive at least 3 hours of therapy per day at least 5 days per week. 13. ELOS: 19-21 days.       14. Prognosis:  good  Delice Lesch, MD 07/07/2015

## 2015-07-07 NOTE — Progress Notes (Signed)
Physical Therapy Treatment Patient Details Name: Becky Patterson MRN: [id number] DOB: 11-18-1955 Today's Date: 07/07/2015    History of Present Illness pt rpesents with R MCA CVA s/p Thrombectomy.  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  pt with no pertinent PMH.      PT Comments    Pt progressing towards physical therapy goals. Attempted transfer with [facility] as pt was successful with transfer to chair after breakfast this morning. Pt required max assist for upright posture in chair, and total assist +2 to attempt sit<>stand with Stedy. Bed pad used for increased support under the hips, however pt was unable to clear hips from the chair (attempted x2). At the time of PT session pt had been in chair for several hours. With this in mind, pt may have been fatigued. Will continue to follow and progress as able per POC.   Follow Up Recommendations  CIR     Equipment Recommendations  None recommended by PT    Recommendations for Other Services Rehab consult     Precautions / Restrictions Precautions Precautions: Fall Precaution Comments: Lt neglect  Restrictions Weight Bearing Restrictions: No    Mobility  Bed Mobility               General bed mobility comments: Pt sitting up in recliner upon PT arrival. Per nursing staff they helped pt transfer to chair with use of the Stedy.  Transfers Overall transfer level: Needs assistance Equipment used: 2 person hand held assist Transfers: Sit to/from Stand Sit to Stand: Total assist;+2 physical assistance         General transfer comment: Attempted sit<>stand with stedy as nursing reports successful transfer with stedy this morning. Pt required increased cues and assist for foot placement on stedy footplate, hand placement, and upright posture while preparing for transfer. Max assist was required at times to keep pt from falling forward out of the chair. Noted overall poor awareness of L side. Attempted  sit<>stand x2 and pt was requiring total assist +2 to initiate.   Ambulation/Gait                 Stairs            Wheelchair Mobility    Modified Rankin (Stroke Patients Only) Modified Rankin (Stroke Patients Only) Pre-Morbid Rankin Score: No symptoms Modified Rankin: Severe disability     Balance Overall balance assessment: Needs assistance Sitting-balance support: Feet supported;Single extremity supported Sitting balance-Leahy Scale: Zero Sitting balance - Comments: Max assist required at times to maintain upright posture. When pt relaxing back in chair, noted heavy lean to the L side.  Postural control: Left lateral lean                          Cognition Arousal/Alertness: Awake/alert Behavior During Therapy: Flat affect Overall Cognitive Status: Impaired/Different from baseline Area of Impairment: Attention;Following commands;Safety/judgement;Awareness;Problem solving   Current Attention Level: Sustained;Selective Memory: Decreased recall of precautions Following Commands: Follows one step commands consistently Safety/Judgement: Decreased awareness of deficits;Decreased awareness of safety Awareness: Intellectual Problem Solving: Decreased initiation;Difficulty sequencing;Requires verbal cues;Requires tactile cues;Slow processing General Comments: Pt demonstrates sustained and at times selective attention.  She becomes distracted by items on her Rt or with concern about spouse's location, but is redirected back to task with min cues.  She is able to tell therapist the events of the day.  She is aware she has had a stroke, but  has poor awareness of her deficits and their impact on her functionally     Exercises      General Comments        Pertinent Vitals/Pain Pain Assessment: No/denies pain    Home Living                      Prior Function            PT Goals (current goals can now be found in the care plan section) Acute  Rehab PT Goals Patient Stated Goal: Per family to move better PT Goal Formulation: With patient/family Time For Goal Achievement: 07/17/15 Potential to Achieve Goals: Good Progress towards PT goals: Progressing toward goals    Frequency  Min 4X/week    PT Plan Current plan remains appropriate    Co-evaluation             End of Session Equipment Utilized During Treatment: Gait belt Activity Tolerance: Patient limited by fatigue Patient left: in chair;with call bell/phone within reach;with chair alarm set     Time: 2035-5974 PT Time Calculation (min) (ACUTE ONLY): 22 min  Charges:  $Therapeutic Activity: 8-22 mins                    G Codes:      Rolinda Roan 07/21/15, 1:08 PM   Rolinda Roan, PT, DPT Acute Rehabilitation Services Pager: [phone]

## 2015-07-07 NOTE — Progress Notes (Signed)
Inpatient Rehabilitation  I have received insurance authorization to admit pt. to CIR.  I have received medical clearance from Burnetta Sabin, stroke NP.  Pt. and husband agreeable.  I will make all necessary arrangements to admit later today.  I have updated Jacqualin Combes, RNCM and Despina Pole, CSW of the plan.  Please call if questions.   [facility] Admissions Coordinator Cell [phone] Office [phone]

## 2015-07-07 NOTE — Discharge Summary (Signed)
Stroke Discharge Summary  Patient ID: Becky Patterson   MRN: [id number]      DOB: 12/17/1955  Date of Admission: 06/29/2015 Date of Discharge: 07/07/2015  Attending Physician:  Rosalin Hawking, MD, Stroke MD Consulting Physician(s):  Jennet Maduro, MD (pulmonary/intensive care), Carlyle Basques, MD (ID), Alysia Penna, MD (Physical Medicine & Rehabtilitation),  Wadie Lessen, NP (Palliative [facility]) Patient's PCP:  No primary care provider on file.  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    Acute respiratory failure with hypoxia ([place]), resolved   Arterial hypotension, resolved   Acute encephalopathy   Stroke due to embolism [facility])   Palliative care encounter   DNR (do not resuscitate) discussion   Dysphagia, post-stroke   Left hemiparesis ([place])   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   Past Medical History  Diagnosis Date  . Lung mass   . Bacterial endocarditis   . Splenic infarction   . Epistaxis    Past Surgical History  Procedure Laterality Date  . Back surgery    . Tee without cardioversion N/A 06/24/2015    Procedure: TRANSESOPHAGEAL ECHOCARDIOGRAM (TEE);  Surgeon: Larey Dresser, MD;  Location: [place];  Service: Cardiovascular;  Laterality: N/A;  . Radiology with  anesthesia N/A 06/29/2015    Procedure: RADIOLOGY WITH ANESTHESIA;  Surgeon: Luanne Bras, MD;  Location: [place];  Service: Radiology;  Laterality: N/A;  . Cholecystectomy      Medications to be continued on Rehab . antiseptic oral rinse  7 mL Mouth Rinse BID  . cefTRIAXone (ROCEPHIN)  IV  2 g Intravenous Q12H  . chlorhexidine gluconate  15 mL Mouth Rinse BID  . enoxaparin (LOVENOX) injection  40 mg Subcutaneous Q24H  . insulin aspart  0-15 Units Subcutaneous TID WC  . multivitamin with minerals  1 tablet Per Tube Daily  . sodium chloride flush  10-40 mL Intracatheter Q12H    LABORATORY STUDIES CBC    Component Value Date/Time   WBC 9.4 07/07/2015 0538   RBC 2.70* 07/07/2015 0538   RBC 2.70* 06/15/2015 2248   HGB 8.0* 07/07/2015 0538   HCT 25.1* 07/07/2015 0538   PLT 362 07/07/2015 0538   MCV 93.0 07/07/2015 0538   MCH 29.6 07/07/2015 0538   MCHC 31.9 07/07/2015 0538   RDW 15.0 07/07/2015 0538   LYMPHSABS 1.9 06/30/2015 0430   MONOABS 1.0 06/30/2015 0430   EOSABS 0.3 06/30/2015 0430   BASOSABS 0.0 06/30/2015 0430   CMP    Component Value Date/Time   NA 135 07/07/2015 0538   K 3.4* 07/07/2015 0538   CL 103 07/07/2015 0538   CO2 25 07/07/2015 0538   GLUCOSE 125* 07/07/2015 0538   BUN 5* 07/07/2015 0538   CREATININE 0.77 07/07/2015 0538   CALCIUM 8.4* 07/07/2015 0538   PROT 6.4* 06/29/2015 1014  ALBUMIN 2.2* 06/29/2015 1014   AST 20 06/29/2015 1014   ALT 16 06/29/2015 1014   ALKPHOS 76 06/29/2015 1014   BILITOT 0.4 06/29/2015 1014   GFRNONAA >60 07/07/2015 0538   GFRAA >60 07/07/2015 0538   COAGS Lab Results  Component Value Date   INR 1.46 06/30/2015   INR 1.41 06/29/2015   INR 1.45 06/17/2015   Lipid Panel    Component Value Date/Time   CHOL 64 06/30/2015 0430   TRIG 109 06/30/2015 0430   HDL 16* 06/30/2015 0430   CHOLHDL 4.0 06/30/2015 0430   VLDL 22 06/30/2015 0430   LDLCALC 26 06/30/2015 0430   HgbA1C  Lab Results  Component Value Date    HGBA1C 6.3* 06/30/2015   Cardiac Panel (last 3 results) No results for input(s): CKTOTAL, CKMB, TROPONINI, RELINDX in the last 72 hours. Urinalysis    Component Value Date/Time   COLORURINE YELLOW 06/30/2015 0100   APPEARANCEUR CLEAR 06/30/2015 0100   LABSPEC 1.017 06/30/2015 0100   PHURINE 6.5 06/30/2015 0100   GLUCOSEU NEGATIVE 06/30/2015 0100   HGBUR NEGATIVE 06/30/2015 0100   BILIRUBINUR NEGATIVE 06/30/2015 0100   KETONESUR NEGATIVE 06/30/2015 0100   PROTEINUR NEGATIVE 06/30/2015 0100   NITRITE NEGATIVE 06/30/2015 0100   LEUKOCYTESUR SMALL* 06/30/2015 0100   Urine Drug Screen     Component Value Date/Time   LABOPIA NONE DETECTED 06/29/2015 1048   COCAINSCRNUR NONE DETECTED 06/29/2015 1048   LABBENZ NONE DETECTED 06/29/2015 1048   AMPHETMU NONE DETECTED 06/29/2015 1048   THCU POSITIVE* 06/29/2015 1048   LABBARB NONE DETECTED 06/29/2015 1048    Alcohol Level    Component Value Date/Time   ETH <5 06/29/2015 1014     SIGNIFICANT DIAGNOSTIC STUDIES I have personally reviewed the radiological images below and agree with the radiology interpretations.  Ct Head Wo Contrast 07/02/2015 1. Continued interval evolution of large ischemic right MCA territory infarct, overall stable in size and distribution Relative to recent MRI. No evidence for hemorrhagic transformation. Localized mass effect with effacement of the right lateral ventricle, right uncal herniation, and 4 mm of right-to-left shift present. No hydrocephalus or evidence for ventricular trapping. 2. Stable size of smaller left anterior insular/subinsular ischemic infarct. No evidence for associated hemorrhage or mass effect. 3. No other acute intracranial process. 06/29/2105 1. 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 2. Sparing of the superior right MCA division. This may be secondary to collaterals of the right anterior cerebral artery. 3. Nonhemorrhagic infarction within the basal ganglia. 06/29/2015 No  adverse features status post Neurointervention for emergent right ICA terminus occlusion. No acute or developing right hemisphere infarct identified.  06/29/2015 Mild chronic microvascular ischemia. No acute abnormality   Ct Angio Head & Neck W/cm &/or Wo/cm 06/29/2015 1. Positive for emergent large vessel occlusion: Occluded right ICA terminus, right MCA and right ACA origins. 7-8 mm occluded segment of the right M1 segment but good right MCA M2 and distal collaterals. Also, the left A1 is dominant such that bilateral A2 and distal ACA flow appears symmetric. 2. Mildly to moderately tortuous cervical ICAs. Mild plaque at the right ICA origin and bulb without stenosis. Minimal calcified right ICA siphon plaque. Moderate plaque at the distal left ICA bulb but without hemodynamically significant stenosis. 3. Proximal left subclavian artery soft plaque without significant stenosis. Negative posterior circulation. 4. Grossly stable CT appearance of the brain since 1001 hours.   Cerebral angiogram  06/29/2015 bilateral CCA and right vertebral artery arteriograms followed by attempted right ICA  terminus and right MCA occlusion using 3 passes of the Solitaire [place] stent retrieval device and 8 mg of superselective E Integrilin. Robust crass filling from left ICA versus ACom and R PCA leptomeningeals.  MRI brain  07/01/2015 Large acute right middle cerebral artery distribution infarct involving portions the right temporal lobe, right frontal lobe, right parietal lobe, operculum region, right basal ganglia and right corona radiata with local mass effect upon the right lateral ventricle with slight bowing of the septum to the left by 2 mm. On gradient sequence no associated blood however, slight T1 hyperintensity within the right lenticular nucleus and subtle blood at this level therefore not excluded. Mild right uncal herniation. Nonhemorrhagic acute anterior left subinsular/peri operculum infarct.  Dg Chest  Port 1 View 07/02/2015 Endotracheal tube not visualized and has presumably been removed. Cardiomegaly with mild bibasilar atelectasis. 07/02/2015 Endotracheal tube 9 cm from carina. Consider advancement by 2-3 cm. Central venous congestion 06/30/2105 Endotracheal to several cm above the carina as described. This could be advanced of 4-5 cm. 06/29/2105 1. Endotracheal tube tip at the thoracic inlet, interim advancement of approximately 3-4 cm should be considered. NG tube in stable position. The 2. Interim partial clearing of right upper lobe atelectasis. 3. Cardiomegaly with mild pulmonary interstitial prominence. A mild component of congestive heart failure cannot be excluded.  06/29/2015 Endotracheal tube is 6 cm above the carina, recommend advancing 2 cm Right upper lobe collapse    HISTORY OF PRESENT ILLNESS TRISTON LISANTI is a 60 y.o. female history of infective endocarditis that was treated with discharge on 2/2, continuing on antibiotics at home. She was being treated with fluoroquinolone. She was doing well until today 06/29/2015 when she had sudden onset of left-sided weakness that started around 6:30 AM ([facility]KW). She woke up normal, with clear speech and no weakness and then developed slurred speech and hand weakness. She has progressively declined since that time and is currently having significant left-sided weakness as well as neglect. She was brought into the emergency room as bilateral arm weakness, but once here was recognized that it was left arm weakness and a code stroke was activated. She went for a CT, and once was recognized that she had infective endocarditis TPA was considered contraindicated. She was taken back for a CT angiogram which did show an M1 occlusion with some collateralization. She was hypotensive and given a total of 1.5 L to try and assist her blood pressure. Taken to interventional radiology per Dr. Estanislado Pandy where he attempted revascularization of the right MCA occlusion with  Solitare and [place] Integrelin. she did not have significant medical follow-up prior to her most recent hospitalization. She was diagnosed with diabetes during recent hospitalization. She was admitted to the neuro ICU for further evaluation and treatment.   HOSPITAL COURSE Ms. DYANE BROBERG is a 60 y.o. female with history of infective endocarditis presenting with left arm weakness. She did not receive IV t-PA due to recent endocarditis. She was taken to IR where there was attempted revascularization of the right MCA occlusion using mechanical thrombectomy and intra-arterial Integrilin. She was admitted to the neuro ICU, intubated. She had a large R MCA infarct with significant cerebral edema with uncal herniation due to R ICA terminus/R MCA occlusion secondary to MV endocarditis. Central line was placed and she was started on 3% to decrease cerebral edema. She was anemic post IR and received 1 unit PRBC for Hgb 7.5. Following, she developed an elevated temp of 101.5 and pruritic rash.  Unclear if temp was a transfusion reaction or reaction to abx. ID was consulted. Changed home PO cipro to cetfriaxone with plans for a 6 week course (ends Mar 7). She was d/c to CIR. Remains at risk for further embolic events. Family aware. Plans from previous admission for OP PET scan and cancer follow up appt remain in place.  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 Also L subinsular/periopercular infarct  TEE (previous admission) 06/24/2015 MV endocarditis, vegetation both leaflets but trivial MR  LDL 26, at goal < 70  HgbA1c 6.9 in Jan  No antithrombotic prior to admission, now on No antithrombotic due to endocarditis as the cause of stroke and risk of bleeding  Palliative care involved by CCM to help family with dx, tx and plan of care in setting of critical illness. Emotional support of husband and family also targeted. Family is clear they want full aggressive treatment and DNR at this time. Should she worsen, they may alter their decision. Palliative care to follow up.  Therapy recommendations: CIR  Disposition: CIR  Cytotoxic Cerebral Edema, resolved  Induced hypernatremia  Started on 3% to induce hypernatremia in order to decrease cerebral edema  Very sensitive to 3% - when on drip, Na up, when rate decreased, overall Na rapidly decreases  3% totally off 2/10 am  Na 142 -> 138  MV Endocarditis with splenic infarcts  dx 05/2015 with sepsis from enterococcus UTI, pyelo, bacteremia   on Cipro 750 mg bid since 06/25/15 (planned total 6 weeks treatment with fluoroquinolone, had been on rocephin 1/23 - 1/30 (no abx 1/31))  new CTX 2 mg IV q 24h this admission  Cipro and CTX initially changed to Primaxin  on 2/11 ID changed back to ceftriaxone with recommendations to switch back to imipenem should she develop a prurutic rach (completion date March 7)  WBC 15.0 -> 9.4  Has PICC line   Repeat 2D day of discharge pending   Planned OP followup - by ID and Cardiology to monitor vegetation  Maculopapular Rash with itching, improved  Over thorax  ? Transfusion reaction vs abx reaction. Unsure etiology  Rocephin and Cipro changed to Primaxin, D#4  Treated with Benadryl   Now back to CTX  Acute Blood Loss Anemia  Hgb 9.5 on admission  Received 1  unit. 2nd unit held due to transfusion reaction of T 101.5  Rash following blood transfusion, unclear if related to blood or abx  Hgb 7.3 -> 7.3  Hypotension  Treated with neo drip  Neo now off   BP stable 110-147  Lung Mass and Liver Mass  1.8 superior segment LLL lung mass and liver mass  Metastasis of unknown Primary vs hamartoma  oncology has not seen pt last admission [facility]  OP PET is pending late Feb.   Diabetes  HgbA1c 6.9, at goal <  7.0  Other Stroke Risk Factors  smoker   THC, UDS positive this admission  Obesity, Body mass index is 36.4 kg/(m^2).  Other Active Problems  Hypokalemia - supplement  Delirium/agitation - dislodged NGT. Bilateral wrist restraints used  Headache   DISCHARGE EXAM Blood pressure 121/65, pulse 88, temperature 98.9 F (37.2 C), temperature source Oral, resp. rate 18, height '5\' 2"'$  (1.575 m), weight 92.08 kg (203 lb), SpO2 98 %. General - Well nourished, well developed, lethargic.  Ophthalmologic - Fundi not visualized due to noncooperation.  Cardiovascular - Regular rate and rhythm.  Mental Status -  Level of arousal and orientation to time, place, and person were intact. Language including expression, naming, repetition, comprehension was assessed and found intact, but mild dysarthria and whisper. Left neglect. Fund of Knowledge was assessed and was intact.  Cranial Nerves II - XII - II - left visual neglect vs. Left homonymous hemianopia. III, IV, VI - right gaze preference but able to cross midline. V - Facial sensation intact bilaterally. VII - Facial movement showed left facial droop. VIII - Hearing & vestibular intact bilaterally. X - Palate elevates symmetrically, but mild dysarthria and whisper.Marland Kitchen XI - Chin turning & shoulder shrug intact bilaterally. XII - Tongue protrusion intact.  Motor Strength - The patient's strength was RUE 0/5, RLE withdraw to pain but not against  gravity, LUE 5/5 and LLE 4/5. Bulk was normal and fasciculations were absent.  Motor Tone - Muscle tone was assessed at the neck and appendages and was normal.  Reflexes - The patient's reflexes were 1+ in all extremities and she had no pathological reflexes.  Sensory - Light touch, temperature/pinprick were assessed and were symmetrical.   Coordination - The patient had normal movements in the right hand with no ataxia or dysmetria. Tremor was absent.  Gait and Station - not tested due to weakness.   Discharge Diet  DIET DYS 3 Room service appropriate?: Yes; Fluid consistency:: Honey Thick liquids  DISCHARGE PLAN  Disposition:  Transfer to [facility] for ongoing PT, OT and ST  abx completion date March 7  Follow up ID at Dr. Bradd Burner  Weekly CBC, CPR,  BMET with results to ID clinic  needs OP PET scan for lung mass followup (ordered 06/25/2015 for for late Feb per last discharge summary)  [facility] followup 07/25/2015 at 930a  No antithrombotic for secondary stroke prevention given MV endocarditis and high risk of bleeding  Recommend ongoing risk factor control by Primary Care Physician at time of discharge from inpatient rehabilitation.  Follow-up No primary care provider on file. in 2 weeks following discharge from rehab.  Follow-up with Dr. Antony Contras, Stroke Clinic in 2 months.   60 minutes were spent preparing discharge.  [place] for Pager information 07/07/2015 12:59 PM   I, the attending vascular neurologist, have personally obtained a history, examined the patient, evaluated laboratory data, individually viewed imaging studies and agree with radiology interpretations. I also discussed with pt and family regarding her care plan. Together with the NP/PA, we formulated the assessment and plan of care which reflects our mutual decision.  I have made any additions or clarifications directly to the above note  and agree with the findings and plan as currently documented.    Rosalin Hawking, MD PhD Stroke Neurology 07/07/2015 2:15 PM

## 2015-07-07 NOTE — Progress Notes (Signed)
[facility] for Infectious Disease    Date of Admission:  07/07/2015   Total days of antibiotics 21        Day 3 days of ceftriaxone           ID: Becky Patterson is a 60 y.o. 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 Pt having cerebral edema due to large right MCA territory infarct with Left side neurologic insult Active Problems:   Acute right MCA stroke (HCC)    Subjective: Afebrile, She is answering questions correctly, slight slow response. Mild itching no rash, + vaginal itching  Interval hx: has tolerated 3 days of ceftriaxone without rash but mild itching unable to tell if it only limited to groin and upper thigh area  Medications:  . sodium chloride   Intravenous Q24H  . antiseptic oral rinse  7 mL Mouth Rinse BID  . cefTRIAXone (ROCEPHIN)  IV  2 g Intravenous Q12H  . chlorhexidine gluconate  15 mL Mouth Rinse BID  . enoxaparin (LOVENOX) injection  40 mg Subcutaneous Q24H  . insulin aspart  0-15 Units Subcutaneous TID WC  . iron polysaccharides  150 mg Oral Daily  . [START ON 07/08/2015] multivitamin with minerals  1 tablet Per Tube Daily  . sodium chloride flush  10-40 mL Intracatheter Q12H  . topiramate  25 mg Oral QHS    Objective: Vital signs in last 24 hours: Temp:  [98.6 F (37 C)-99.1 F (37.3 C)] 99.1 F (37.3 C) (02/13 1440) Pulse Rate:  [87-99] 87 (02/13 1440) Resp:  [18] 18 (02/13 1440) BP: (114-133)/(56-88) 133/66 mmHg (02/13 1440) SpO2:  [94 %-98 %] 98 % (02/13 1440) Weight:  [203 lb (92.08 kg)] 203 lb (92.08 kg) (02/13 0500) Physical Exam  Constitutional:  appears well-developed and well-nourished. No distress.  HENT: [place]/AT, PERRLA, no scleral icterus Mouth/Throat: Oropharynx is clear and moist. No oropharyngeal exudate.  Cardiovascular: Normal rate, regular rhythm and normal heart sounds. Exam reveals no gallop and no friction rub.   No murmur heard.  Pulmonary/Chest: Effort normal and breath sounds normal. No respiratory distress.  has no wheezes.  Abdominal: Soft. Bowel sounds are normal.  exhibits no distension. There is no tenderness.  Skin: Skin is warm and dry.  Neuro = unable to make any grip strength with left arm or movement to left leg. Does report sensation to left leg. Strong right arm grip strength Lab Results  Recent Labs  07/06/15 0215 07/07/15 0538  WBC 9.7 9.4  HGB 7.9* 8.0*  HCT 25.2* 25.1*  NA 138 135  K 3.7 3.4*  CL 103 103  CO2 26 25  BUN 8 5*  CREATININE 0.75 0.77   Liver Panel No results for input(s): PROT, ALBUMIN, AST, ALT, ALKPHOS, BILITOT, BILIDIR, IBILI in the last 72 hours.  Microbiology: 2/5blood cx ngtd Studies/Results: No results found.   Assessment/Plan: 60yo F with enterobacter mitral valve endocarditis on 2nd week of treatment when developed CNS emboli which presented as left sided arm weakness and slurred speech s/p attempted clot retrieval R ICA/R MCA by interventional radiology on 2/5. Patient has evolving large right mca territory infarct as well as small infarct to basal ganglia with cerebral edema. Currently on day 21 of 56 days  - at the beginning of this admission, she had drug allergy though it was difficult to tell if it was from cipro vs. Ceftriaxone. We have restarted her  on ceftriaxone for which she appears to tolerate.  -macularpapupular rash =  Now resolved.    -vaginal yeast infection = would treat with fluconazole '150mg'$  now and PRN if still has ongoing vaginitis  MSSA MV endocarditis with CNS emboli = will treat for a total of 8 wk, currently starting her 3rd wk of abtx. For now, plan to do ceftriaxone 2gm IV Q 12 hr. If she develops rash, then we will switc  - would recommend TTE/TEE at end of course of therapy  Itching = can do prn atarax  Will sign off. We will plan to see her back in clinic in 5-6 wk timeframe  If questions, please do not  hesitate to call  [facility] for Infectious Diseases Cell: [phone] Pager: [phone]  07/07/2015, 5:02 PM

## 2015-07-07 NOTE — Progress Notes (Signed)
Becky Patterson Re[facility] Admission Coordinator Signed Physical Medicine and Rehabilitation PMR Pre-admission 07/07/2015 2:05 PM  Related encounter: ED to Hosp-Admission (Discharged) from 06/29/2015 in [place] [facility]apse [facility]   PMR Admission Coordinator Pre-Admission Assessment  Patient: Becky Patterson is an 60 y.o., female MRN: [id number] DOB: Feb 03, 1956 Height: '5\' 2"'$  (157.5 cm) Weight: 92.08 kg (203 lb)  Insurance Information HMO: PPO: PCP: IPA: 80/20: yes OTHER:  PRIMARY: [employer] Policy#: [id number] Subscriber: spouse CM Name: Becky Patterson Phone#: [phone] Fax#: EMR access Pre-Cert#: [id number], follow up will be done by Becky Patterson via EMR access 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 Date: 05/25/15 Deduct: $3700 Out of Pocket Max: [phone] Life Max: n/a CIR: 80/20% SNF: 80/20% Outpatient: 80% Co-Pay: 20% Home Health: 80% Co-Pay: 20% DME: 80% Co-Pay: 20% Providers: In network SECONDARY: Policy#: Subscriber:  CM Name: Phone#: Fax#:  Pre-Cert#: Employer:  Benefits: Phone #: Name:  Eff. Date: Deduct: Out of Pocket Max: Life Max:  CIR: SNF:  Outpatient: Co-Pay:  Home Health: Co-Pay:  DME: Co-Pay:   Medicaid Application Date: Case Manager:  Disability Application Date: Case Worker:   Emergency Contact Information Contact Information    Name Relation Home Work Mobile   Becky Patterson,Becky Patterson  [id number]  [phone]   Ivin Poot   [phone]     Current Medical History  Patient Admitting Diagnosis: Right MCA infarct with left  hemiplegia, dysphasia, dysarthria, dysphagia, homonymous hemianopsia, hemisensory deficits History of Present Illness: Becky Patterson is a 60 y.o. 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 She was admitted on 06/29/15 with acute onset of left sided weakness with slurred speech and left neglect. UDS positive for THC. CT head negative and she underwent CTA brain showed M1 occlusion and Becky Patterson attempted revascularization of the right MCA occlusion with Solitare and IA Integrelin. She required fluid boluses due to hypotension. She developed diffuse rash question due reaction from transfusion v/s antibiotics. Becky Patterson consulted for input and recommended switching to carbapenem and repeat TTE at end of course of antibiotics. Follow up CCT with large posterior inferior right MCA infarct with evolution and no hemorrhage. MRI brain showed large R-MCA infarct with local mass effect on right lateral ventricle and mild right uncal herniation as well as non hemorrhagic left subinsular/ perioperculum infarct. Becky Patterson feels that infarct due to septic emboli and no antithrombotic due to risk of bleeding. She was weaned off vent on 02/08 and MBS done for swallow evaluation. Se was started on dysphagia 3, honey liquids due to moderate oropharyngeal dysphagia with aspiration of liquids thinner than honey. Maculopapular Rash has resolved and she was switched back to Ceftriaxone. Patient with resultant left neglect with right gaze preference, dense left hemiparesis with sensory deficits, cognitive deficits and dysphagia. CIR recommended for follow up therapy Total: 13 NIH    Past Medical History  Past Medical History  Diagnosis Date  . Lung mass   . Bacterial endocarditis   . Splenic infarction   . Epistaxis     Family History  family history includes Heart attack in her brother; Liver  cancer in her brother; Lung cancer in her mother.  Prior Rehab/Hospitalizations:  Has the patient had major surgery during 100 days prior to admission? No  Current Medications   Current facility-administered medications:  . 0.9 % sodium chloride infusion, ,  Intravenous, Continuous, Early Chars Rinehuls, PA-C, Last Rate: 50 mL/hr at 07/06/15 0954 . acetaminophen (TYLENOL) tablet 650 mg, 650 mg, Oral, Q6H PRN, Knox Royalty, NP, 650 mg at 07/07/15 0237 . antiseptic oral rinse solution (CORINZ), 7 mL, Mouth Rinse, BID, Chesley Mires, MD, 7 mL at 07/07/15 1000 . cefTRIAXone (ROCEPHIN) 2 g in dextrose 5 % 50 mL IVPB, 2 g, Intravenous, Q12H, Carlyle Basques, MD, 2 g at 07/07/15 1047 . chlorhexidine gluconate (PERIDEX) 0.12 % solution 15 mL, 15 mL, Mouth Rinse, BID, Greta Doom, MD, 15 mL at 07/07/15 0823 . diphenhydrAMINE (BENADRYL) injection 12.5 mg, 12.5 mg, Intravenous, Q6H PRN, Javier Glazier, MD, 12.5 mg at 07/07/15 0443 . enoxaparin (LOVENOX) injection 40 mg, 40 mg, Subcutaneous, Q24H, Rosalin Hawking, MD, 40 mg at 07/06/15 1645 . insulin aspart (novoLOG) injection 0-15 Units, 0-15 Units, Subcutaneous, TID WC, Rosalin Hawking, MD, 1 Units at 07/07/15 1223 . multivitamin with minerals tablet 1 tablet, 1 tablet, Per Tube, Daily, Rush Farmer, MD, 1 tablet at 07/07/15 [phone] . oxyCODONE-acetaminophen (PERCOCET/ROXICET) 5-325 MG per tablet 1 tablet, 1 tablet, Oral, Q6H PRN, Rosalin Hawking, MD, 1 tablet at 07/07/15 1011 . RESOURCE THICKENUP CLEAR, , Oral, PRN, Rosalin Hawking, MD . sodium chloride flush (NS) 0.9 % injection 10-40 mL, 10-40 mL, Intracatheter, Q12H, Garvin Fila, MD, 10 mL at 07/06/15 2001 . sodium chloride flush (NS) 0.9 % injection 10-40 mL, 10-40 mL, Intracatheter, PRN, Garvin Fila, MD, 10 mL at 07/07/15 0501  Patients Current Diet: DIET DYS 3 Room service appropriate?: Yes; Fluid consistency:: Honey Thick  Precautions / Restrictions Precautions Precautions:  Fall Precaution Comments: Lt neglect  Restrictions Weight Bearing Restrictions: No   Has the patient had 2 or more falls or a fall with injury in the past year?No  Prior Activity Level Community (5-7x/wk): Pt. and husband report pt. out of the home most days. Pt. has drivers lisence but does not like to have to drive  Home Assistive Devices / [place] Devices/Equipment: None Home Equipment: None  Prior Device Use: Indicate devices/aids used by the patient prior to current illness, exacerbation or injury? None of the above; no device used PTA  Prior Functional Level Prior Function Level of Independence: Independent Comments: Worked as Air traffic controller.  Self Care: Did the patient need help bathing, dressing, using the toilet or eating? Independent  Indoor Mobility: Did the patient need assistance with walking from room to room (with or without device)? Independent  Stairs: Did the patient need assistance with internal or external stairs (with or without device)? Independent  Functional Cognition: Did the patient need help planning regular tasks such as shopping or remembering to take medications? Independent  Current Functional Level Cognition  Arousal/Alertness: Awake/alert Overall Cognitive Status: Impaired/Different from baseline Difficult to assess due to: Level of arousal, Impaired communication Current Attention Level: Sustained, Selective Orientation Level: Oriented X4 Following Commands: Follows one step commands consistently Safety/Judgement: Decreased awareness of deficits, Decreased awareness of safety General Comments: Pt demonstrates sustained and at times selective attention. She becomes distracted by items on her Rt or with concern about spouse's location, but is redirected back to task with min cues. She is able to tell therapist the events of the day. She is aware she has had a stroke, but has poor awareness of her deficits and their impact on  her functionally  Attention: Sustained Sustained Attention: Impaired Sustained Attention Impairment: Verbal complex, Functional complex Memory: (TBD) Awareness: Impaired Awareness Impairment: Intellectual impairment Problem Solving: (  TBD) Safety/Judgment: Impaired   Extremity Assessment (includes Sensation/Coordination)  Upper Extremity Assessment: LUE deficits/detail LUE Deficits / Details: edema noted Lt UE. Pt does not move Lt UE to command, but spontaneously flexed elbow ~40* with no awareness of doing so. Spouse reports she has done this several times  LUE Coordination: decreased fine motor, decreased gross motor  Lower Extremity Assessment: Defer to PT evaluation LLE Deficits / Details: No active movement noted in LE. pt has delayed response to painful stimulation.  LLE Sensation: decreased light touch LLE Coordination: decreased fine motor, decreased gross motor    ADLs  Overall ADL's : Needs assistance/impaired Eating/Feeding: Minimal assistance, Sitting Eating/Feeding Details (indicate cue type and reason): see with SLP. Pt fed self spoonfuls of honey thick liquids with assist to stabilize the cup. She is able to drink from cup with supervision  Grooming: Wash/dry hands, Wash/dry face, Moderate assistance, Sitting Upper Body Bathing: Maximal assistance, Sitting Lower Body Bathing: Total assistance, Sitting/lateral leans, Bed level Upper Body Dressing : Maximal assistance, Sitting Lower Body Dressing: Total assistance, Sitting/lateral leans, Bed level Toilet Transfer: Total assistance, Squat-pivot Toileting- Clothing Manipulation and Hygiene: Total assistance, Sitting/lateral lean, Bed level Functional mobility during ADLs: Total assistance General ADL Comments: spouse present during session and is very supportive of pt.     Mobility  Overal bed mobility: Needs Assistance, +2 for physical assistance Bed Mobility: Sit to Supine Supine to sit: Max  assist Sit to supine: Max assist General bed mobility comments: Pt sitting up in recliner upon PT arrival. Per nursing staff they helped pt transfer to chair with use of the Stedy.    Transfers  Overall transfer level: Needs assistance Equipment used: 2 person hand held assist Transfer via Lift Equipment: Stedy Transfers: Sit to/from Stand Sit to Stand: Total assist, +2 physical assistance Stand pivot transfers: Total assist, +2 physical assistance General transfer comment: Attempted sit<>stand with stedy as nursing reports successful transfer with stedy this morning. Pt required increased cues and assist for foot placement on stedy footplate, hand placement, and upright posture while preparing for transfer. Max assist was required at times to keep pt from falling forward out of the chair. Noted overall poor awareness of L side. Attempted sit<>stand x2 and pt was requiring total assist +2 to initiate.     Ambulation / Gait / Stairs / Office manager / Balance Dynamic Sitting Balance Sitting balance - Comments: Max assist required at times to maintain upright posture. When pt relaxing back in chair, noted heavy lean to the L side.  Balance Overall balance assessment: Needs assistance Sitting-balance support: Feet supported, Single extremity supported Sitting balance-Leahy Scale: Zero Sitting balance - Comments: Max assist required at times to maintain upright posture. When pt relaxing back in chair, noted heavy lean to the L side.  Postural control: Left lateral lean Standing balance support: During functional activity Standing balance-Leahy Scale: Poor    Special needs/care consideration BiPAP/CPAP no CPM no Continuous Drip IV 0.9% NaCl at 50 ml/hr Dialysis no  Life Vest no Oxygen no Special Bed no Trach Size no Wound Vac (area) no  Skin husband states pt had area of redness on left buttock in her recent admission; Nursing  assessment now documenting dry skin but no documentation of buttock redness   Bowel mgmt: Last BM 07/06/15; incontinent per husband Bladder mgmt: incontinent per husband Diabetic mgmt A1c 6.9 in January    Previous Home Environment Living Arrangements: Spouse/significant other Available Help at  Discharge: Family, Available 24 hours/day Type of Home: House Home Layout: One level Home Access: Stairs to enter, Ramped entrance (ramp at front entrance, steps at back entrance) Entrance Stairs-Number of Steps: 4 Bathroom Shower/Tub: Optometrist: Yes How Accessible: Accessible via walker [place]: No Additional Comments: Per family pt was very independent PTA   Discharge Living Setting Plans for Discharge Living Setting: Patient's home Type of Home at Discharge: House Discharge Home Layout: One level Discharge Home Access: Stairs to enter, Ramped entrance (ramp at front entrance, steps at back entrance) Entrance Stairs-Number of Steps: 4 Discharge Bathroom Shower/Tub: [place] unit Discharge Bathroom Toilet: Standard Discharge Bathroom Accessibility: Yes How Accessible: Accessible via walker Does the patient have any problems obtaining your medications?: No  Social/Family/Support Systems Patient Roles: Spouse, Parent Anticipated Caregiver: Becky Patterson, husband [phone]); Becky Patterson, pt's sister, [phone] Ability/Limitations of Caregiver: husband is truck driver but currently out of work due to a pacemaker placement on 06/06/15. He anticipates he will be out of work another month. Pt.'s sister is retired and will assist as much as needed. Husband anticipates he will be able to assist at a "minimal assistande level" with his right UE Caregiver Availability: 24/7 Discharge Plan Discussed with Primary Caregiver: Yes Is Caregiver In Agreement with Plan?: Yes Does Caregiver/Family have Issues  with Lodging/Transportation while Pt is in Rehab?: No   Goals/Additional Needs Patient/Family Goal for Rehab: min assist PT/OT/SLP Expected length of stay: 18-21 days Cultural Considerations: none Dietary Needs: dysphagia 3, honey thick liquids, meds crushed in puree Equipment Needs: TBA Pt/Family Agrees to Admission and willing to participate: Yes Program Orientation Provided & Reviewed with Pt/Caregiver Including Roles & Responsibilities: Yes   Decrease burden of Care through IP rehab admission: no   Possible need for SNF placement upon discharge: Not anticipated   Patient Condition: This patient's medical and functional status has changed since the consult dated: 07/03/15 in which the Rehabilitation Physician determined and documented that the patient's condition is appropriate for intensive rehabilitative care in an inpatient rehabilitation facility. See "History of Present Illness" (above) for medical update. Functional changes are: Pt. Transferring at +2 total assist level , zero sitting balance at edge of bed, min assist for eating/feeding. Patient's medical and functional status update has been discussed with the Rehabilitation physician and patient remains appropriate for inpatient rehabilitation. Will admit to inpatient rehab today.  Preadmission Screen Completed By: Becky Patterson, 07/07/2015 2:27 PM ______________________________________________________________________  Discussed status with Becky. Posey Pronto on 07/07/15 at 1425 and received telephone approval for admission today.  Admission Coordinator: Becky Patterson, time 1601 /Date 07/07/15          Cosigned by: Ankit Lorie Phenix, MD at 07/07/2015 2:45 PM  Revision History     Date/Time User Provider Type Action   07/07/2015 2:45 PM Ankit Lorie Phenix, MD Physician Cosign   07/07/2015 2:28 PM Becky Patterson Rehab Admission Coordinator Sign

## 2015-07-07 NOTE — Progress Notes (Signed)
Report given to [place] from 4W. Family at bedside with belongings. Patient transferred to [phone].   Ave Filter, RN

## 2015-07-07 NOTE — PMR Pre-admission (Signed)
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 Date:  05/25/15     Deduct:  $3700      Out of Pocket Max:  [phone]      Life Max:  n/a CIR:  80/20%      SNF:  80/20% Outpatient:  80%     Co-Pay:  20% Home Health:  80%      Co-Pay:  20% DME:  80%     Co-Pay:  20% Providers:  In network SECONDARY:       Policy#:       Subscriber:  CM Name:       Phone#:      Fax#:  Pre-Cert#:       Employer:  Benefits:  Phone #:      Name:  Eff. Date:      Deduct:       Out of Pocket Max:       Life Max:  CIR:       SNF:  Outpatient:      Co-Pay:  Home Health:       Co-Pay:  DME:      Co-Pay:   Medicaid Application Date:       Case Manager:  Disability Application Date:       Case Worker:   Emergency Contact Information Contact Information    Name Relation Home Work Mobile   Eslinger,Frank  [phone]  [phone]   Ivin Poot   [phone]     Current Medical History  Patient Admitting Diagnosis: Right MCA infarct with left hemiplegia, dysphasia, dysarthria, dysphagia, homonymous hemianopsia, hemisensory deficits History of Present Illness: Becky Patterson is a 61 y.o. 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 She was admitted on 06/29/15 with acute onset of left sided weakness with slurred speech and left neglect. UDS positive for THC. CT head  negative and she underwent CTA brain showed M1 occlusion and Dr. Estanislado Pandy attempted revascularization of the right MCA occlusion with Solitare and IA Integrelin. She required fluid boluses due to hypotension. She developed diffuse rash question due reaction from transfusion v/s antibiotics. Dr. Julio Alm consulted for input and recommended switching to carbapenem and repeat TTE at end of course of antibiotics. Follow up CCT with large posterior inferior right MCA infarct with evolution and no hemorrhage. MRI brain showed large R-MCA infarct with local mass effect on right lateral ventricle and mild right uncal herniation as well as non hemorrhagic left subinsular/ perioperculum infarct. Dr Leonie Man feels that infarct due to septic emboli and no antithrombotic  due to risk of bleeding. She was weaned off vent on 02/08 and MBS done for swallow evaluation. Se was started on dysphagia 3, honey liquids due to moderate oropharyngeal dysphagia with aspiration of liquids thinner than honey. Maculopapular Rash has resolved and she was switched back to Ceftriaxone. Patient with resultant left neglect with right gaze preference, dense left hemiparesis with sensory deficits, cognitive deficits and dysphagia. CIR recommended for follow up therapy Total: 13 [facility]    Past Medical History  Past Medical History  Diagnosis Date  . Lung mass   . Bacterial endocarditis   . Splenic infarction   . Epistaxis     Family History  family history includes Heart attack in her brother; Liver cancer in her brother; Lung cancer in her mother.  Prior Rehab/Hospitalizations:  Has the patient had major surgery during 100 days prior to admission? No  Current Medications   Current facility-administered medications:  .  0.9 %  sodium chloride infusion, , Intravenous, Continuous, David L Rinehuls, PA-C, Last Rate: 50 mL/hr at 07/06/15 0954 .  acetaminophen (TYLENOL) tablet 650 mg, 650 mg, Oral, Q6H PRN, Knox Royalty, NP, 650 mg at  07/07/15 0237 .  antiseptic oral rinse solution (CORINZ), 7 mL, Mouth Rinse, BID, Chesley Mires, MD, 7 mL at 07/07/15 1000 .  cefTRIAXone (ROCEPHIN) 2 g in dextrose 5 % 50 mL IVPB, 2 g, Intravenous, Q12H, Carlyle Basques, MD, 2 g at 07/07/15 1047 .  chlorhexidine gluconate (PERIDEX) 0.12 % solution 15 mL, 15 mL, Mouth Rinse, BID, Greta Doom, MD, 15 mL at 07/07/15 0823 .  diphenhydrAMINE (BENADRYL) injection 12.5 mg, 12.5 mg, Intravenous, Q6H PRN, Javier Glazier, MD, 12.5 mg at 07/07/15 0443 .  enoxaparin (LOVENOX) injection 40 mg, 40 mg, Subcutaneous, Q24H, Rosalin Hawking, MD, 40 mg at 07/06/15 1645 .  insulin aspart (novoLOG) injection 0-15 Units, 0-15 Units, Subcutaneous, TID WC, Rosalin Hawking, MD, 1 Units at 07/07/15 1223 .  multivitamin with minerals tablet 1 tablet, 1 tablet, Per Tube, Daily, Rush Farmer, MD, 1 tablet at 07/07/15 [phone] .  oxyCODONE-acetaminophen (PERCOCET/ROXICET) 5-325 MG per tablet 1 tablet, 1 tablet, Oral, Q6H PRN, Rosalin Hawking, MD, 1 tablet at 07/07/15 1011 .  RESOURCE THICKENUP CLEAR, , Oral, PRN, Rosalin Hawking, MD .  sodium chloride flush (NS) 0.9 % injection 10-40 mL, 10-40 mL, Intracatheter, Q12H, Garvin Fila, MD, 10 mL at 07/06/15 2001 .  sodium chloride flush (NS) 0.9 % injection 10-40 mL, 10-40 mL, Intracatheter, PRN, Garvin Fila, MD, 10 mL at 07/07/15 0501  Patients Current Diet: DIET DYS 3 Room service appropriate?: Yes; Fluid consistency:: Honey Thick  Precautions / Restrictions Precautions Precautions: Fall Precaution Comments: Lt neglect  Restrictions Weight Bearing Restrictions: No   Has the patient had 2 or more falls or a fall with injury in the past year?No  Prior Activity Level Community (5-7x/wk): Pt. and husband report pt. out of the home most days.  Pt. has drivers lisence but does not like to have to drive  Home Assistive Devices / [place] Devices/Equipment: None Home Equipment: None  Prior Device Use: Indicate  devices/aids used by the patient prior to current illness, exacerbation or injury? None of the above; no device used PTA  Prior Functional Level Prior Function Level of Independence: Independent Comments: Worked as Air traffic controller.  Self Care: Did the patient need help bathing, dressing, using the toilet or eating?  Independent  Indoor Mobility: Did the patient need assistance with walking from  room to room (with or without device)? Independent  Stairs: Did the patient need assistance with internal or external stairs (with or without device)? Independent  Functional Cognition: Did the patient need help planning regular tasks such as shopping or remembering to take medications? Independent  Current Functional Level Cognition  Arousal/Alertness: Awake/alert Overall Cognitive Status: Impaired/Different from baseline Difficult to assess due to: Level of arousal, Impaired communication Current Attention Level: Sustained, Selective Orientation Level: Oriented X4 Following Commands: Follows one step commands consistently Safety/Judgement: Decreased awareness of deficits, Decreased awareness of safety General Comments: Pt demonstrates sustained and at times selective attention.  She becomes distracted by items on her Rt or with concern about spouse's location, but is redirected back to task with min cues.  She is able to tell therapist the events of the day.  She is aware she has had a stroke, but has poor awareness of her deficits and their impact on her functionally  Attention: Sustained Sustained Attention: Impaired Sustained Attention Impairment: Verbal complex, Functional complex Memory:  (TBD) Awareness: Impaired Awareness Impairment: Intellectual impairment Problem Solving:  (TBD) Safety/Judgment: Impaired    Extremity Assessment (includes Sensation/Coordination)  Upper Extremity Assessment: LUE deficits/detail LUE Deficits / Details: edema noted Lt UE.  Pt does not move Lt UE to  command, but spontaneously flexed elbow ~40* with no awareness of doing so.  Spouse reports she has done this several times  LUE Coordination: decreased fine motor, decreased gross motor  Lower Extremity Assessment: Defer to PT evaluation LLE Deficits / Details: No active movement noted in LE.  pt has delayed response to painful stimulation.   LLE Sensation: decreased light touch LLE Coordination: decreased fine motor, decreased gross motor    ADLs  Overall ADL's : Needs assistance/impaired Eating/Feeding: Minimal assistance, Sitting Eating/Feeding Details (indicate cue type and reason): see with SLP.  Pt fed self spoonfuls of honey thick liquids with assist to stabilize the cup.  She is able to drink from cup with supervision  Grooming: Wash/dry hands, Wash/dry face, Moderate assistance, Sitting Upper Body Bathing: Maximal assistance, Sitting Lower Body Bathing: Total assistance, Sitting/lateral leans, Bed level Upper Body Dressing : Maximal assistance, Sitting Lower Body Dressing: Total assistance, Sitting/lateral leans, Bed level Toilet Transfer: Total assistance, Squat-pivot Toileting- Clothing Manipulation and Hygiene: Total assistance, Sitting/lateral lean, Bed level Functional mobility during ADLs: Total assistance General ADL Comments: spouse present during session and is very supportive of pt.     Mobility  Overal bed mobility: Needs Assistance, +2 for physical assistance Bed Mobility: Sit to Supine Supine to sit: Max assist Sit to supine: Max assist General bed mobility comments: Pt sitting up in recliner upon PT arrival. Per nursing staff they helped pt transfer to chair with use of the Stedy.    Transfers  Overall transfer level: Needs assistance Equipment used: 2 person hand held assist Transfer via Lift Equipment: Stedy Transfers: Sit to/from Stand Sit to Stand: Total assist, +2 physical assistance Stand pivot transfers: Total assist, +2 physical assistance General  transfer comment: Attempted sit<>stand with stedy as nursing reports successful transfer with stedy this morning. Pt required increased cues and assist for foot placement on stedy footplate, hand placement, and upright posture while preparing for transfer. Max assist was required at times to keep pt from falling forward out of the chair. Noted overall poor awareness of L side. Attempted sit<>stand x2 and pt was requiring total assist +2 to initiate.     Ambulation / Gait / Stairs / Emergency planning/management officer  Posture / Balance Dynamic Sitting Balance Sitting balance - Comments: Max assist required at times to maintain upright posture. When pt relaxing back in chair, noted heavy lean to the L side.  Balance Overall balance assessment: Needs assistance Sitting-balance support: Feet supported, Single extremity supported Sitting balance-Leahy Scale: Zero Sitting balance - Comments: Max assist required at times to maintain upright posture. When pt relaxing back in chair, noted heavy lean to the L side.  Postural control: Left lateral lean Standing balance support: During functional activity Standing balance-Leahy Scale: Poor    Special needs/care consideration BiPAP/CPAP  no CPM   no Continuous Drip IV  0.9% NaCl at 50 ml/hr Dialysis no        Life Vest  no Oxygen   no Special Bed   no Trach Size   no Wound Vac (area)   no       Skin husband states pt had area of redness on left buttock in her recent admission; Nursing assessment now documenting dry skin but no documentation of buttock redness                     Bowel mgmt:   Last BM 07/06/15; incontinent per husband Bladder mgmt: incontinent per husband Diabetic mgmt  A1c 6.9 in January    Previous Home Environment Living Arrangements: Spouse/significant other Available Help at Discharge: Family, Available 24 hours/day Type of Home: House Home Layout: One level Home Access: Stairs to enter, Ramped entrance (ramp at front entrance, steps  at back entrance) [employer] of Steps: 4 Bathroom Shower/Tub: Optometrist: Yes How Accessible: Accessible via walker Home Care Services: No Additional Comments: Per family pt was very independent PTA   Discharge Living Setting Plans for Discharge Living Setting: Patient's home Type of Home at Discharge: House Discharge Home Layout: One level Discharge Home Access: Stairs to enter, Ramped entrance (ramp at front entrance, steps at back entrance) Entrance Stairs-Number of Steps: 4 Discharge Bathroom Shower/Tub: Tub/shower unit Discharge Bathroom Toilet: Standard Discharge Bathroom Accessibility: Yes How Accessible: Accessible via walker Does the patient have any problems obtaining your medications?: No  Social/Family/Support Systems Patient Roles: Spouse, Parent Anticipated Caregiver: Henryetta Corriveau, husband [phone]); Jimmy Footman, pt's sister, [phone] Ability/Limitations of Caregiver: husband is truck driver but currently out of work due to a pacemaker placement on 06/06/15.  He anticipates he will be out of work another month.  Pt.'s sister is retired and will assist as much as needed.  Husband anticipates he will be able to assist at a "minimal assistande level" with his right UE Caregiver Availability: 24/7 Discharge Plan Discussed with Primary Caregiver: Yes Is Caregiver In Agreement with Plan?: Yes Does Caregiver/Family have Issues with Lodging/Transportation while Pt is in Rehab?: No   Goals/Additional Needs Patient/Family Goal for Rehab: min assist PT/OT/SLP Expected length of stay:  18-21 days Cultural Considerations:  none Dietary Needs: dysphagia 3, honey thick liquids, meds crushed in puree Equipment Needs: TBA Pt/Family Agrees to Admission and willing to participate: Yes Program Orientation Provided & Reviewed with Pt/Caregiver Including Roles  & Responsibilities: Yes   Decrease burden of Care  through IP rehab admission: no   Possible need for SNF placement upon discharge:  Not anticipated   Patient Condition: This patient's medical and functional status has changed since the consult dated: 07/03/15 in which the Rehabilitation Physician determined and documented that the patient's condition is appropriate for intensive rehabilitative care in an inpatient rehabilitation facility. See "History  of Present Illness" (above) for medical update. Functional changes are:  Pt. Transferring at +2 total assist level , zero sitting balance at edge of bed, min assist for eating/feeding. Patient's medical and functional status update has been discussed with the Rehabilitation physician and patient remains appropriate for inpatient rehabilitation. Will admit to inpatient rehab today.  Preadmission Screen Completed By:  Gerlean Ren, 07/07/2015 2:27 PM ______________________________________________________________________   Discussed status with Dr.  Posey Pronto on 07/07/15 at  1425  and received telephone approval for admission today.  Admission Coordinator:  Gerlean Ren, time 3790 /Date 07/07/15

## 2015-07-07 NOTE — Care Management Note (Signed)
Case Management Note  Patient Details  Name: KANDRA GRAVEN MRN: [id number] Date of Birth: 23-Jul-1955  Subjective/Objective:                    Action/Plan: Plan is for patient to discharge to CIR today. No further needs per CM.   Expected Discharge Date:                  Expected Discharge Plan:  [place]  In-House Referral:     Discharge planning Services  CM Consult  Post Acute Care Choice:    Choice offered to:     DME Arranged:    DME Agency:     HH Arranged:    [place] Agency:     Status of Service:  Completed, signed off  Medicare Important Message Given:    Date Medicare IM Given:    Medicare IM give by:    Date Additional Medicare IM Given:    Additional Medicare Important Message give by:     If discussed at [place] of Stay Meetings, dates discussed:    Additional Comments:  Pollie Friar, RN 07/07/2015, 1:35 PM

## 2015-07-07 NOTE — Interval H&P Note (Signed)
Becky Patterson was admitted today to Inpatient Rehabilitation with the diagnosis of large R-MCA territory stroke due to septic emboli.  The patient's history has been reviewed, patient examined, and there is no change in status.  Patient continues to be appropriate for intensive inpatient rehabilitation.  I have reviewed the patient's chart and labs.  Questions were answered to the patient's satisfaction. The PAPE has been reviewed and assessment remains appropriate.  Catherene Kaleta Lorie Phenix 07/07/2015, 8:28 PM

## 2015-07-08 ENCOUNTER — Inpatient Hospital Stay (HOSPITAL_COMMUNITY): Payer: 59 | Admitting: Occupational Therapy

## 2015-07-08 ENCOUNTER — Inpatient Hospital Stay (HOSPITAL_COMMUNITY): Payer: 59 | Admitting: Speech Pathology

## 2015-07-08 ENCOUNTER — Inpatient Hospital Stay (HOSPITAL_COMMUNITY): Payer: 59 | Admitting: Physical Therapy

## 2015-07-08 LAB — CBC WITH DIFFERENTIAL/PLATELET
BASOS ABS: 0.1 10*3/uL (ref 0.0–0.1)
Basophils Relative: 1 %
EOS ABS: 0.3 10*3/uL (ref 0.0–0.7)
EOS PCT: 3 %
HCT: 23.4 % — ABNORMAL LOW (ref 36.0–46.0)
Hemoglobin: 7.6 g/dL — ABNORMAL LOW (ref 12.0–15.0)
LYMPHS PCT: 25 %
Lymphs Abs: 2 10*3/uL (ref 0.7–4.0)
MCH: 30.5 pg (ref 26.0–34.0)
MCHC: 32.5 g/dL (ref 30.0–36.0)
MCV: 94 fL (ref 78.0–100.0)
MONO ABS: 0.6 10*3/uL (ref 0.1–1.0)
Monocytes Relative: 7 %
Neutro Abs: 5.2 10*3/uL (ref 1.7–7.7)
Neutrophils Relative %: 64 %
PLATELETS: 323 10*3/uL (ref 150–400)
RBC: 2.49 MIL/uL — ABNORMAL LOW (ref 3.87–5.11)
RDW: 15.4 % (ref 11.5–15.5)
WBC: 8.1 10*3/uL (ref 4.0–10.5)

## 2015-07-08 LAB — COMPREHENSIVE METABOLIC PANEL
ALT: 18 U/L (ref 14–54)
AST: 18 U/L (ref 15–41)
Albumin: 1.9 g/dL — ABNORMAL LOW (ref 3.5–5.0)
Alkaline Phosphatase: 66 U/L (ref 38–126)
Anion gap: 11 (ref 5–15)
BUN: 6 mg/dL (ref 6–20)
CHLORIDE: 103 mmol/L (ref 101–111)
CO2: 24 mmol/L (ref 22–32)
Calcium: 8.1 mg/dL — ABNORMAL LOW (ref 8.9–10.3)
Creatinine, Ser: 0.63 mg/dL (ref 0.44–1.00)
Glucose, Bld: 111 mg/dL — ABNORMAL HIGH (ref 65–99)
POTASSIUM: 3.4 mmol/L — AB (ref 3.5–5.1)
Sodium: 138 mmol/L (ref 135–145)
Total Bilirubin: 0.4 mg/dL (ref 0.3–1.2)
Total Protein: 5.6 g/dL — ABNORMAL LOW (ref 6.5–8.1)

## 2015-07-08 LAB — GLUCOSE, CAPILLARY
Glucose-Capillary: 106 mg/dL — ABNORMAL HIGH (ref 65–99)
Glucose-Capillary: 129 mg/dL — ABNORMAL HIGH (ref 65–99)
Glucose-Capillary: 147 mg/dL — ABNORMAL HIGH (ref 65–99)
Glucose-Capillary: 120 mg/dL — ABNORMAL HIGH (ref 65–99)

## 2015-07-08 MED ORDER — POLYSACCHARIDE IRON COMPLEX 150 MG PO CAPS
150.0000 mg | ORAL_CAPSULE | Freq: Two times a day (BID) | ORAL | Status: DC
Start: 2015-07-08 — End: 2015-07-31
  Administered 2015-07-08 – 2015-07-30 (×46): 150 mg via ORAL
  Filled 2015-07-08 (×45): qty 1

## 2015-07-08 MED ORDER — TOPIRAMATE 25 MG PO TABS
25.0000 mg | ORAL_TABLET | Freq: Two times a day (BID) | ORAL | Status: DC
  Administered 2015-07-08 – 2015-07-10 (×5): 25 mg via ORAL
  Filled 2015-07-08 (×5): qty 1

## 2015-07-08 MED ORDER — POTASSIUM CHLORIDE CRYS ER 10 MEQ PO TBCR
10.0000 meq | EXTENDED_RELEASE_TABLET | Freq: Every day | ORAL | Status: DC
  Administered 2015-07-08 – 2015-07-21 (×14): 10 meq via ORAL
  Filled 2015-07-08 (×14): qty 1

## 2015-07-08 NOTE — Progress Notes (Signed)
60 y.o. 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 She was admitted on 06/29/15 with acute onset of left sided weakness with slurred speech and left neglect. UDS positive for THC. CT head negative and she underwent CTA brain showed M1 occlusion and Dr. Estanislado Pandy attempted revascularization of the right MCA occlusion with Solitare and IA Integrelin. She required fluid boluses due to hypotension. She developed diffuse rash question due reaction from transfusion v/s antibiotics. Dr. Baxter Flattery consulted for input and recommended switching to carbapenem and repeat TTE at end of course of antibiotics. Follow up CCT with large posterior inferior right MCA infarct with evolution and no hemorrhage. MRI brain showed large R-MCA infarct with local mass effect on right lateral ventricle and mild right uncal herniation as well as non hemorrhagic left subinsular/ perioperculum infarct. Dr Leonie Man feels that infarct due to septic emboli and no antithrombotic due to risk of bleeding  Subjective/Complaints: No issues overnite Has HA ROS- No SOB/CP No abd pain, - N/V, no blood in stool, stools dark  Objective: Vital Signs: Blood pressure 114/70, pulse 82, temperature 99 F (37.2 C), temperature source Oral, resp. rate 18, height 5' 1" (1.549 m), weight 91.173 kg (201 lb), SpO2 97 %. No results found. Results for orders placed or performed during the hospital encounter of 07/07/15 (from the past 72 hour(s))  Glucose, capillary     Status: None   Collection Time: 07/07/15  4:29 PM  Result Value Ref Range   Glucose-Capillary 91 65 - 99 mg/dL   Comment 1 Notify RN   Glucose, capillary     Status: Abnormal   Collection Time: 07/07/15  8:54 PM  Result Value Ref Range   Glucose-Capillary 163 (H) 65 - 99 mg/dL  CBC WITH DIFFERENTIAL     Status: Abnormal   Collection Time: 07/08/15  6:07 AM  Result Value Ref Range    WBC 8.1 4.0 - 10.5 K/uL   RBC 2.49 (L) 3.87 - 5.11 MIL/uL   Hemoglobin 7.6 (L) 12.0 - 15.0 g/dL   HCT 23.4 (L) 36.0 - 46.0 %   MCV 94.0 78.0 - 100.0 fL   MCH 30.5 26.0 - 34.0 pg   MCHC 32.5 30.0 - 36.0 g/dL   RDW 15.4 11.5 - 15.5 %   Platelets 323 150 - 400 K/uL   Neutrophils Relative % 64 %   Neutro Abs 5.2 1.7 - 7.7 K/uL   Lymphocytes Relative 25 %   Lymphs Abs 2.0 0.7 - 4.0 K/uL   Monocytes Relative 7 %   Monocytes Absolute 0.6 0.1 - 1.0 K/uL   Eosinophils Relative 3 %   Eosinophils Absolute 0.3 0.0 - 0.7 K/uL   Basophils Relative 1 %   Basophils Absolute 0.1 0.0 - 0.1 K/uL  Comprehensive metabolic panel     Status: Abnormal   Collection Time: 07/08/15  6:07 AM  Result Value Ref Range   Sodium 138 135 - 145 mmol/L   Potassium 3.4 (L) 3.5 - 5.1 mmol/L   Chloride 103 101 - 111 mmol/L   CO2 24 22 - 32 mmol/L   Glucose, Bld 111 (H) 65 - 99 mg/dL   BUN 6 6 - 20 mg/dL   Creatinine, Ser 0.63 0.44 - 1.00 mg/dL   Calcium 8.1 (L) 8.9 - 10.3 mg/dL   Total Protein 5.6 (L) 6.5 - 8.1 g/dL   Albumin 1.9 (L) 3.5 - 5.0 g/dL   AST  18 15 - 41 U/L   ALT 18 14 - 54 U/L   Alkaline Phosphatase 66 38 - 126 U/L   Total Bilirubin 0.4 0.3 - 1.2 mg/dL   GFR calc non Af Amer >60 >60 mL/min   GFR calc Af Amer >60 >60 mL/min    Comment: (NOTE) The eGFR has been calculated using the CKD EPI equation. This calculation has not been validated in all clinical situations. eGFR's persistently <60 mL/min signify possible Chronic Kidney Disease.    Anion gap 11 5 - 15  Glucose, capillary     Status: Abnormal   Collection Time: 07/08/15  6:34 AM  Result Value Ref Range   Glucose-Capillary 106 (H) 65 - 99 mg/dL     HEENT: normal Cardio: RRR and no murmur Resp: CTA B/L and unlabored GI: BS positive and NT, ND Extremity:  No Edema Skin:   Intact and Other no heel breakdown Neuro: Lethargic, Confused, Flat, Cranial Nerve Abnormalities Left CN central 7, Abnormal Sensory reduced pinch sensation in  LUE and LLE, Abnormal Motor 0/5 LUE and LLE, Tone:  clonus in Left ankle, Dysarthric and Inattention Musc/Skel:  Other no pain with LUE or LLE ROM Gen NAD   Assessment/Plan: 1. Functional deficits secondary to Right MCA infarct with Left HP, Left neglect, Left hemisensory deficits which require 3+ hours per day of interdisciplinary therapy in a comprehensive inpatient rehab setting. Physiatrist is providing close team supervision and 24 hour management of active medical problems listed below. Physiatrist and rehab team continue to assess barriers to discharge/monitor patient progress toward functional and medical goals. FIM:                   Function - Comprehension Comprehension: Auditory Comprehension assist level: Understands basic 75 - 89% of the time/ requires cueing 10 - 24% of the time  Function - Expression Expression: Verbal Expression assist level: Expresses basic 75 - 89% of the time/requires cueing 10 - 24% of the time. Needs helper to occlude trach/needs to repeat words.  Function - Social Interaction Social Interaction assist level: Interacts appropriately 75 - 89% of the time - Needs redirection for appropriate language or to initiate interaction.  Function - Problem Solving Problem solving assist level: Solves basic 75 - 89% of the time/requires cueing 10 - 24% of the time  Function - Memory Memory assist level: Recognizes or recalls 75 - 89% of the time/requires cueing 10 - 24% of the time Patient normally able to recall (first 3 days only): Current season, Staff names and faces, That he or she is in a hospital  Medical Problem List and Plan: 1.  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 No antithrombotic needed.  Initiate CIR 2.  DVT Prophylaxis/Anticoagulation: Pharmaceutical: Lovenox 3. Pain Management: Has chronic back pain per family. Will use tylenol prn for now.   4. Mood: LCSW to  follow for evaluation and support.   5. Neuropsych: This patient is not fully capable of making decisions on her own behalf. 6. Skin/Wound Care: routine pressure relief measures.   7. Fluids/Electrolytes/Nutrition: Monitor I/O. Getting dehydrated with honey liquids. Will need IVF at nights and need to encourage fluid intake during the day. Check lytes in am.   8. 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 PET scan on outpatient basis. 10. Hypokalemia: K+ 3.4 this am. Check labs this pm and again in  am, supplement orally 11. Cytotoxic edema: Weaned of hypertonic solution.    12. Maculopapular rash:  Likely due to antibiotics per ID and itching being managed with prn benadryl. Antibiotics narrowed to Ceftriaxone on 02/11--monitor for now. IF rash recurs will need to change back to imipenem. no evidence of rash today   Antibiotic D # 24--End date March 7th.   13. Anemia: Hgb low but has stayed in 7.6-8.0 range, check stool guaic  14. Hypotension: Cont to monitor 15. Headache: Will start topamax   LOS (Days) 1 A FACE TO FACE EVALUATION WAS PERFORMED  Becky Patterson E 07/08/2015, 7:35 AM

## 2015-07-08 NOTE — Progress Notes (Signed)
Patient had an assisted decent to the floor from the wheelchair while trying to be repositioned by this RN and Ladona Mow, NT. Patient as put back to bed with the assistance of the maxi-move and Denman George, [employer]. Patient resting comfortably in bed with no complaints of pain. Will continue to monitor patient.

## 2015-07-08 NOTE — Evaluation (Addendum)
Speech Language Pathology Assessment and Plan  Patient Details  Name: Becky Patterson MRN: [id number] Date of Birth: 26-Nov-1955  SLP Diagnosis: Cognitive Impairments;Dysphagia;Dysarthria  Rehab Potential: Good ELOS: 21-28 days     Today's Date: 07/08/2015 SLP Individual Time: 1303-1400 SLP Individual Time Calculation (min): 57 min   Problem List:  Patient Active Problem List   Diagnosis Date Noted  . Acute right MCA stroke ([place]) 07/07/2015  . Intracranial septic embolism ([place])   . Hemiplegia affecting left nondominant side ([place])   . Dysphagia   . Alteration of sensation as late effect of stroke   . Dehydration   . Bacterial endocarditis   . Maculopapular rash   . Acute blood loss anemia   . Other vascular headache   . Other complicated headache syndrome   . 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 Left hemiparesis ([place]) 07/03/2015  . 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 Visual field cut 07/03/2015  . Palliative care encounter   . DNR (do not resuscitate) discussion   . Acute respiratory failure with hypoxia ([place]) 06/30/2015  . Arterial hypotension 06/30/2015  . Acute encephalopathy   . Stroke due to embolism ([place])   . Stroke (cerebrum) ([place]) 06/29/2015  . Colitis   . Pyelonephritis   . Enterococcal bacteremia 06/19/2015  . Sepsis secondary to UTI ([place])   . Mass of lower lobe of left lung   . Tobacco abuse   . Anemia of chronic disease   . Hyponatremia   . Hyperglycemia   . Hypokalemia   . Lung mass 06/15/2015   Past Medical History:  Past Medical History  Diagnosis Date  . Lung mass   . Bacterial endocarditis   . Splenic infarction   . Epistaxis    Past Surgical History:  Past Surgical History  Procedure Laterality Date  . Back surgery    . Tee without cardioversion N/A 06/24/2015    Procedure: TRANSESOPHAGEAL ECHOCARDIOGRAM (TEE);  Surgeon: Larey Dresser, MD;  Location: [facility];  Service: Cardiovascular;  Laterality: N/A;  . Radiology with  anesthesia N/A 06/29/2015    Procedure: RADIOLOGY WITH ANESTHESIA;  Surgeon: Luanne Bras, MD;  Location: [facility];  Service: Radiology;  Laterality: N/A;  . Cholecystectomy      Assessment / Plan / Recommendation Clinical Impression  Becky Patterson is a 60 y.o.female with history of lung mass, recent sepsis with MV endocarditis with splenic infarcts and was discharged on 06/26/15 on antibiotics. She was admitted on 06/29/15 with acute onset of left sided weakness with slurred speech and left neglect. UDS positive for THC.  MRI brain showed large R-MCA infarct with local mass effect on right lateral ventricle and mild right uncal herniation as well as non hemorrhagic left subinsular/perioperculum infarct. Pt admitted to CIR on 07/07/2015.  SLP evaluation completed on 07/08/2015 with the following results: Pt presents with s/s of a moderate oropharyngeal dysphagia consistent with most recent objective swallow study.  Pt presented with suspected delayed swallow initiation which resulted in immediate cough in 1/4 trials of ice chips, and x1 with large boluses of honey thick liquids.  Pt with left sided buccal residue of solids due to left sided oral motor weakness as well as anterior labial spillage of solids and liquids resulting from decreased labial seal.  Recommend that pt remain on dys 3, honey thick liquids with full supervision.   Pt also presents with mild dysarthria due left sided oral motor weakness and severe cognitive impairments characterized by decreased sustained attention  to tasks, left inattention, decreased intellectual awareness of deficits, and decreased safety awareness with impulsivity.   Pt was independent prior to admission and has experienced a significant loss in function s/p CVA.  As a result, she would benefit from skilled ST while inpatient in order to maximize functional independence and reduce burden of care prior to discharge.    Skilled Therapeutic Interventions           Cognitive-linguistic and bedside swallow evaluation completed with results and recommendations reviewed with patient and family. [name] reported that he had been helping pt with meals prior to coming to CIR.  SLP reviewed and reinforced rationale behind swallowing precautions and currently prescribed diet, including results of most recent MBS.  [name] returned demonstration of cuing techniques to maximize pt's safety with POs during lunch today and is now signed off to supervise pt during meals; however, SLP will continue to follow up to assess toleration during meals and readiness for advancement given pt's significant cognitive deficits.      SLP Assessment  Patient will need skilled Speech Lanaguage Pathology Services during CIR admission    Recommendations  Medication Administration: Crushed with puree Compensations: Slow rate;Small sips/bites;Multiple dry swallows after each bite/sip;Lingual sweep for clearance of pocketing Postural Changes and/or Swallow Maneuvers: Seated upright 90 degrees Oral Care Recommendations: Oral care BID Patient destination: Home Follow up Recommendations: 24 hour supervision/assistance;Home Health SLP;Outpatient SLP Equipment Recommended: To be determined    SLP Frequency 3 to 5 out of 7 days   SLP Duration  SLP Intensity  SLP Treatment/Interventions 21-28 days   Minumum of 1-2 x/day, 30 to 90 minutes  Cognitive remediation/compensation;Cueing hierarchy;Functional tasks;Environmental controls;Dysphagia/aspiration precaution training;Internal/external aids;Patient/family education    Pain Pain Assessment Pain Assessment: No/denies pain Faces Pain Scale: Hurts even more Pain Type: Acute pain Pain Location: Neck Pain Descriptors / Indicators: Discomfort;Tiring Pain Intervention(s): Repositioned;Ambulation/increased activity  Prior Functioning Cognitive/Linguistic Baseline: Within functional limits Type of Home: House  Lives With:  Spouse Available Help at Discharge: Family Vocation: Self employed  Function:  Eating Eating   Modified Consistency Diet: Yes Eating Assist Level: Helper checks for pocketed food;Help with picking up utensils;Supervision or verbal cues;Set up assist for   Eating Set Up Assist For: Opening containers       Cognition Comprehension Comprehension assist level: Understands basic 75 - 89% of the time/ requires cueing 10 - 24% of the time  Expression   Expression assist level: Expresses basic 75 - 89% of the time/requires cueing 10 - 24% of the time. Needs helper to occlude trach/needs to repeat words.  Social Interaction Social Interaction assist level: Interacts appropriately 25 - 49% of time - Needs frequent redirection.  Problem Solving Problem solving assist level: Solves basic 25 - 49% of the time - needs direction more than half the time to initiate, plan or complete simple activities  Memory Memory assist level: Recognizes or recalls 25 - 49% of the time/requires cueing 50 - 75% of the time   Short Term Goals: Week 1: SLP Short Term Goal 1 (Week 1): Pt will attend to the left in >50% of observable opportunities with max assist verbal cues.   SLP Short Term Goal 2 (Week 1): Pt will sustain her attention to basic familiar tasks for 3-5 minute intervals with mod assist verbal cues for redirection.  SLP Short Term Goal 3 (Week 1): Pt will consume her currently prescribed diet with min verbal cues for use of swallowing precautions.   SLP Short  Term Goal 4 (Week 1): Pt will consume therapeutic trials of ice chips with minimal overt s/s of aspiration to determine readiness for repeat objective study.  SLP Short Term Goal 5 (Week 1): Pt will identify at least 2 deficits occurring s/p CVA with mod question cues.    Refer to Care Plan for Long Term Goals  Recommendations for other services: None  Discharge Criteria: Patient will be discharged from SLP if patient refuses treatment 3  consecutive times without medical reason, if treatment goals not met, if there is a change in medical status, if patient makes no progress towards goals or if patient is discharged from hospital.  The above assessment, treatment plan, treatment alternatives and goals were discussed and mutually agreed upon: by patient and by family  Halea Lieb, Selinda Orion 07/08/2015, 4:10 PM

## 2015-07-08 NOTE — Evaluation (Signed)
Physical Therapy Assessment and Plan  Patient Details  Name: Becky Patterson MRN: [id number] Date of Birth: 04-Mar-1956  PT Diagnosis: Cognitive deficits, Difficulty walking, Hemiplegia non-dominant, Impaired sensation and Muscle weakness Rehab Potential: Good ELOS: 21-28 days   Today's Date: 07/08/2015 PT Individual Time: 7902-4097 PT Individual Time Calculation (min): 71 min    Problem List:  Patient Active Problem List   Diagnosis Date Noted  . Acute right MCA stroke ([place]) 07/07/2015  . Intracranial septic embolism ([place])   . Hemiplegia affecting left nondominant side ([place])   . Dysphagia   . Alteration of sensation as late effect of stroke   . Dehydration   . Bacterial endocarditis   . Maculopapular rash   . Acute blood loss anemia   . Other vascular headache   . Other complicated headache syndrome   . 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 Left hemiparesis ([place]) 07/03/2015  . 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 Visual field cut 07/03/2015  . Palliative care encounter   . DNR (do not resuscitate) discussion   . Acute respiratory failure with hypoxia ([place]) 06/30/2015  . Arterial hypotension 06/30/2015  . Acute encephalopathy   . Stroke due to embolism ([place])   . Stroke (cerebrum) ([place]) 06/29/2015  . Colitis   . Pyelonephritis   . Enterococcal bacteremia 06/19/2015  . Sepsis secondary to UTI ([place])   . Mass of lower lobe of left lung   . Tobacco abuse   . Anemia of chronic disease   . Hyponatremia   . Hyperglycemia   . Hypokalemia   . Lung mass 06/15/2015    Past Medical History:  Past Medical History  Diagnosis Date  . Lung mass   . Bacterial endocarditis   . Splenic infarction   . Epistaxis    Past Surgical History:  Past Surgical History  Procedure Laterality Date  . Back surgery    . Tee without cardioversion N/A 06/24/2015    Procedure: TRANSESOPHAGEAL ECHOCARDIOGRAM (TEE);  Surgeon: Larey Dresser, MD;  Location: [facility];  Service:  Cardiovascular;  Laterality: N/A;  . Radiology with anesthesia N/A 06/29/2015    Procedure: RADIOLOGY WITH ANESTHESIA;  Surgeon: Luanne Bras, MD;  Location: [facility];  Service: Radiology;  Laterality: N/A;  . Cholecystectomy      Assessment & Plan Clinical Impression: Patient is a 60 y.o. year old female with recent admission to the hospital on 06/29/15 with acute onset of left sided weakness with slurred speech and left neglect. UDS positive for THC. CT head negative and she underwent CTA brain showed M1 occlusion and right MCA occlusion .  Patient transferred to CIR on 07/07/2015 .   Patient currently requires total with mobility secondary to muscle weakness, abnormal tone, unbalanced muscle activation, decreased coordination and decreased motor planning, decreased attention, decreased awareness, decreased safety awareness and delayed processing and decreased sitting balance, decreased standing balance, decreased postural control, hemiplegia and decreased balance strategies.  Prior to hospitalization, patient was independent  with mobility and lived with Spouse in a House home.  Home access is  Ramped entrance.  Patient will benefit from skilled PT intervention to maximize safe functional mobility, minimize fall risk and decrease caregiver burden for planned discharge home with 24 hour assist.  Anticipate patient will benefit from follow up [facility] at discharge.  PT - End of Session Activity Tolerance: Tolerates 30+ min activity with multiple rests Endurance Deficit: Yes PT Assessment Rehab Potential (ACUTE/IP ONLY): Good Barriers to Discharge: Decreased caregiver support PT  Patient demonstrates impairments in the following area(s): Balance;Endurance;Motor;Pain;Safety;Sensory PT Transfers Functional Problem(s): Bed Mobility;Bed to Chair;Car;Furniture PT Locomotion Functional Problem(s): Ambulation;Wheelchair Mobility;Stairs PT Plan PT Intensity: Minimum of 1-2 x/day ,45 to 90 minutes PT  Frequency: 5 out of 7 days PT Duration Estimated Length of Stay: 21-28 days PT Treatment/Interventions: Ambulation/gait training;Discharge planning;Functional mobility training;Therapeutic Activities;Therapeutic Exercise;Wheelchair propulsion/positioning;Neuromuscular re-education;Balance/vestibular training;Cognitive remediation/compensation;Community reintegration;Functional electrical stimulation;Patient/family education;Stair training;UE/LE Coordination activities;UE/LE Strength taining/ROM;Splinting/orthotics;Pain management;DME/adaptive equipment instruction PT Transfers Anticipated Outcome(s): min A PT Locomotion Anticipated Outcome(s): supervision w/c  Skilled Therapeutic Intervention Pt performed seated balance statically edge of bed with intermittent max A, able to sit with min A but easily distracted by family in room or nursing entering room, unable to maintain balance, pt unaware of LOB leaning to Lt and forward each time.  Pt requires mod/max cuing for attention throughout treatment.  Squat pivot transfer with max A of 2 to w/c. Pt unable to maintain head control in standard w/c, neck and trunk into extension. Pt transferred to recliner in room with +2 assist.  PT provided reclining w/c for pt to try next session.  Seated edge of chair dynamic reaching activity for trunk and head control with max A for balance and max cuing to find object if not in Rt visual field.    PT Evaluation Precautions/Restrictions Precautions Precautions: Fall Precaution Comments: Lt neglect  Restrictions Weight Bearing Restrictions: No Pain Pain Assessment Pain Assessment: Faces Faces Pain Scale: Hurts little more Pain Type: Chronic pain Pain Location: Neck Pain Descriptors / Indicators: Aching Pain Onset: On-going Pain Intervention(s): Repositioned;Distraction;Rest Home Living/Prior Functioning Home Living Available Help at Discharge: Family Type of Home: House Home Access: Ramped entrance Home  Layout: One level Additional Comments: Per family pt was very independent PTA   Lives With: Spouse Prior Function Level of Independence: Independent with basic ADLs;Independent with gait;Independent with transfers  Able to Take Stairs?: Yes Comments: Worked as Air traffic controller.  Cognition Overall Cognitive Status: Impaired/Different from baseline Sustained Attention: Impaired Sustained Attention Impairment: Verbal basic;Verbal complex Awareness: Impaired Awareness Impairment: Intellectual impairment Safety/Judgment: Impaired Sensation Sensation Light Touch: Impaired Detail Light Touch Impaired Details: Impaired LLE;Impaired LUE Proprioception: Impaired Detail Proprioception Impaired Details: Impaired LUE;Impaired LLE Coordination Gross Motor Movements are Fluid and Coordinated: No Fine Motor Movements are Fluid and Coordinated: No Coordination and Movement Description: Lt hemiplegia Motor  Motor Motor: Hemiplegia;Abnormal postural alignment and control;Abnormal tone Motor - Skilled Clinical Observations: Lt hemiplegia, clonus present on L ankle    Trunk/Postural Assessment  Cervical Assessment Cervical Assessment:  (head turns to Rt due to Lt neglect) Thoracic Assessment Thoracic Assessment: Within Functional Limits Lumbar Assessment Lumbar Assessment:  (posterior pelvic tilt) Postural Control Postural Control: Deficits on evaluation Head Control: decreased, neck pain and poor attention Trunk Control: wants to go into extension Righting Reactions: absent Protective Responses: delayed  Balance Balance Balance Assessed: Yes Static Sitting Balance Static Sitting - Comment/# of Minutes: max A for sitting edge of bed Dynamic Sitting Balance Sitting balance - Comments: max/total A for sitting and reaching Extremity Assessment      RLE Assessment RLE Assessment: Within Functional Limits LLE Assessment LLE Assessment:  (0/5 noted in knee, hip and ankle)   See Function  Navigator for Current Functional Status.   Refer to Care Plan for Long Term Goals  Recommendations for other services: None  Discharge Criteria: Patient will be discharged from PT if patient refuses treatment 3 consecutive times without medical reason, if treatment goals not met, if there is a change in  medical status, if patient makes no progress towards goals or if patient is discharged from hospital.  The above assessment, treatment plan, treatment alternatives and goals were discussed and mutually agreed upon: by patient  [facility] 07/08/2015, 11:10 AM

## 2015-07-08 NOTE — Progress Notes (Signed)
Patient information reviewed and entered into eRehab system by Gailen Venne, RN, CRRN, PPS Coordinator.  Information including medical coding and functional independence measure will be reviewed and updated through discharge.    

## 2015-07-08 NOTE — Care Management Note (Signed)
Inpatient Rehabilitation Center Individual Statement of Services  Patient Name:  Becky Patterson  Date:  07/08/2015  Welcome to the [facility].  Our goal is to provide you with an individualized program based on your diagnosis and situation, designed to meet your specific needs.  With this comprehensive rehabilitation program, you will be expected to participate in at least 3 hours of rehabilitation therapies Monday-Friday, with modified therapy programming on the weekends.  Your rehabilitation program will include the following services:  Physical Therapy (PT), Occupational Therapy (OT), Speech Therapy (ST), 24 hour per day rehabilitation nursing, Therapeutic Recreaction (TR), Neuropsychology, Case Management (Social Worker), Rehabilitation Medicine, Nutrition Services and Pharmacy Services  Weekly team conferences will be held on Wednesday to discuss your progress.  Your Social Worker will talk with you frequently to get your input and to update you on team discussions.  Team conferences with you and your family in attendance may also be held.  Expected length of stay: 21-28 days Overall anticipated outcome: min/mod level of assist  Depending on your progress and recovery, your program may change. Your Social Worker will coordinate services and will keep you informed of any changes. Your Social Worker's name and contact numbers are listed  below.  The following services may also be recommended but are not provided by the [facility] will be made to provide these services after discharge if needed.  Arrangements include referral to agencies that provide these services.  Your insurance has been verified to be:  [facility] Your primary doctor is:  New MD-Dorothy Gevena Mart has not seen yet  Pertinent information will  be shared with your doctor and your insurance company.  Social Worker:  Ovidio Kin, [place] or ([phone]  Information discussed with and copy given to patient by: [name], 07/08/2015, 12:50 PM

## 2015-07-08 NOTE — Progress Notes (Signed)
Social Work  Social Work Assessment and Plan  Patient Details  Name: Becky Patterson MRN: [id number] Date of Birth: [birth year]  Today's Date: 07/08/2015  Problem List:  Patient Active Problem List   Diagnosis Date Noted  . Acute right MCA stroke ([place]) 07/07/2015  . Intracranial septic embolism ([place])   . Hemiplegia affecting left nondominant side ([place])   . Dysphagia   . Alteration of sensation as late effect of stroke   . Dehydration   . Bacterial endocarditis   . Maculopapular rash   . Acute blood loss anemia   . Other vascular headache   . Other complicated headache syndrome   . 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 Left hemiparesis ([place]) 07/03/2015  . 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 Visual field cut 07/03/2015  . Palliative care encounter   . DNR (do not resuscitate) discussion   . Acute respiratory failure with hypoxia ([place]) 06/30/2015  . Arterial hypotension 06/30/2015  . Acute encephalopathy   . Stroke due to embolism ([employer])   . Stroke (cerebrum) ([place]) 06/29/2015  . Colitis   . Pyelonephritis   . Enterococcal bacteremia 06/19/2015  . Sepsis secondary to UTI ([place])   . Mass of lower lobe of left lung   . Tobacco abuse   . Anemia of chronic disease   . Hyponatremia   . Hyperglycemia   . Hypokalemia   . Lung mass 06/15/2015   Past Medical History:  Past Medical History  Diagnosis Date  . Lung mass   . Bacterial endocarditis   . Splenic infarction   . Epistaxis    Past Surgical History:  Past Surgical History  Procedure Laterality Date  . Back surgery    . Tee without cardioversion N/A 06/24/2015    Procedure: TRANSESOPHAGEAL ECHOCARDIOGRAM (TEE);  Surgeon: Larey Dresser, MD;  Location: [facility];  Service: Cardiovascular;  Laterality: N/A;  . Radiology with anesthesia N/A 06/29/2015    Procedure: RADIOLOGY WITH ANESTHESIA;  Surgeon: Luanne Bras, MD;  Location: [facility];  Service: Radiology;  Laterality: N/A;  . Cholecystectomy     Social  History:  reports that she has been smoking.  She does not have any smokeless tobacco history on file. She reports that she does not drink alcohol or use illicit drugs.  Family / Support Systems Marital Status: Married How Long?: 17 years Patient Roles: Spouse, Parent, Other (Comment) (employee) Spouse/Significant Other: Pilar Plate [phone]  [phone] Children: Two children who are local Other Supports: Peggy Turner-sister  [phone] Anticipated Caregiver: Husband and sister Ability/Limitations of Caregiver: Husband is a Administrator and out due to recovering from pacemaker surgery-out til April. Sister is retired and willing to assist, but unsure how much assist they can provide-depends upon pt';s progress while here Caregiver Availability: 24/7 Family Dynamics: Pt and husband have been together off and on their whole life, husband becomes tearful when discussing how they belonged together and eventually got back together, after failed marriages. Pt's children are close and will do what they can for her and he has a child who is supportive also. Pt's sister is supportive.  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    Abuse/Neglect Physical Abuse: Denies Verbal Abuse: Denies Sexual Abuse: Denies Exploitation of patient/patient's resources: Denies Self-Neglect: Denies  Emotional Status Pt's affect, behavior adn adjustment status: Pt is motivated but very sleepy and wanting to rest, after one  therapy. Husband reports she is very stubborn and hopefully can direct that into pushing herself to do well here. She has always been independent and taken care of others. She is not used to beingnin this role and she doesn't like it. Recent Psychosocial Issues: Had been sick since Jan 2017 flu, sepsis and now this. Pyschiatric History: No history deferred depression screen due to pt too tired and wanting to rest. Due to her young age and independence she would benefit from seeing Neuro-psych while here. Once she becomes aware of her deficits she may be at risk for depression while here. Substance Abuse History: Tobacco-smoked two-three packs a day, no other issues according to husband, unaware of her positive THC when admitted. Will address with pt when more awake  Patient / Family Perceptions, Expectations & Goals Pt/Family understanding of illness & functional limitations: Husband can explain wife's stroke and deficits. He has been here daily and observing her in therapies and will be here now while on rehab. Pt has a basic understanding of her stroke and deficits. She is very blunt and says what is on her mind at the time. Premorbid pt/family roles/activities: Wife, Mother, Self employed, friend, sister, etc Anticipated changes in roles/activities/participation: resume Pt/family expectations/goals: Husband states: " I tried to get her to come in but she was so stubborn and now this has happened." Husband states: " I will do what I can to help her."  Pt states: " Get me home, my butt hurts."  [employer]: None Premorbid Home Care/DME Agencies: None Transportation available at discharge: [employer] referrals recommended: Neuropsychology, Support group (specify)  Discharge Planning Living Arrangements: Spouse/significant other Support Systems: Spouse/significant other, Children, Other relatives, Friends/neighbors, Social worker community Type of Residence: Private  residence Insurance Resources: Multimedia programmer (specify) Sports administrator) Financial Resources: Family Support, Employment Financial Screen Referred: No Living Expenses: Lives with family Money Management: Spouse, Patient Does the patient have any problems obtaining your medications?: No Home Management: Patient did the home management Patient/Family Preliminary Plans: Return home with husband and sister to assist with her care. Husband will be returning to work sometime in April after being cleared by his cardiologist to resume driving a truck. Sister is retired and can assist. it does dependent upon pt's progress here and if they can manage her at home. Await team's evaluations. Social Work Anticipated Follow Up Needs: HH/OP, SNF, Support Group  Clinical Impression Pleasant exhausted female who tells it like it is. Her husband is here and supportive and will do whatever she will need but will need to work also to maintain the household. Pt sister is supportive and willing to assist. Discharge will depend Upon if pt can get to a level where she can be managed by her sister and husband. Informed husband to go ahead and apply for disability for pt. Made husband aware pt will need 24 hr physically care at discharge. Also has more physical issues with lung nodule and follow up appointments for this, found incidentally on a CT scan. Will provide support and work on a safe discharge plan. Pt will benefit from neuro-psych seeing while here will make referral.  [name] 07/08/2015, 1:15 PM

## 2015-07-08 NOTE — Evaluation (Signed)
Occupational Therapy Assessment and Plan  Patient Details  Name: Becky Patterson MRN: [id number] Date of Birth: 12/11/1955  OT Diagnosis: abnormal posture, acute pain, cognitive deficits, flaccid hemiplegia and hemiparesis and muscle weakness (generalized) Rehab Potential: Rehab Potential (ACUTE ONLY): Fair ELOS: 25-28 days   Today's Date: 07/08/2015 OT Individual Time: 0900-1008 OT Individual Time Calculation (min): 68 min     Problem List:  Patient Active Problem List   Diagnosis Date Noted  . Acute right MCA stroke ([place]) 07/07/2015  . Intracranial septic embolism ([place])   . Hemiplegia affecting left nondominant side ([place])   . Dysphagia   . Alteration of sensation as late effect of stroke   . Dehydration   . Bacterial endocarditis   . Maculopapular rash   . Acute blood loss anemia   . Other vascular headache   . Other complicated headache syndrome   . 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 Left hemiparesis ([place]) 07/03/2015  . 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 Visual field cut 07/03/2015  . Palliative care encounter   . DNR (do not resuscitate) discussion   . Acute respiratory failure with hypoxia ([place]) 06/30/2015  . Arterial hypotension 06/30/2015  . Acute encephalopathy   . Stroke due to embolism ([place])   . Stroke (cerebrum) ([place]) 06/29/2015  . Colitis   . Pyelonephritis   . Enterococcal bacteremia 06/19/2015  . Sepsis secondary to UTI ([place])   . Mass of lower lobe of left lung   . Tobacco abuse   . Anemia of chronic disease   . Hyponatremia   . Hyperglycemia   . Hypokalemia   . Lung mass 06/15/2015    Past Medical History:  Past Medical History  Diagnosis Date  . Lung mass   . Bacterial endocarditis   . Splenic infarction   . Epistaxis    Past Surgical History:  Past Surgical History  Procedure Laterality Date  . Back surgery    . Tee without cardioversion N/A 06/24/2015    Procedure: TRANSESOPHAGEAL ECHOCARDIOGRAM (TEE);  Surgeon: Larey Dresser, MD;  Location: [facility];  Service: Cardiovascular;  Laterality: N/A;  . Radiology with anesthesia N/A 06/29/2015    Procedure: RADIOLOGY WITH ANESTHESIA;  Surgeon: Luanne Bras, MD;  Location: [facility];  Service: Radiology;  Laterality: N/A;  . Cholecystectomy      Assessment & Plan Clinical Impression: Patient is a 60 y.o. year old female with recent admission to the hospital on 06/29/15 with acute onset of left sided weakness with slurred speech and left neglect. UDS positive for THC. CT head negative and she underwent CTA brain showed M1 occlusion and Dr. Estanislado Pandy attempted revascularization of the right MCA occlusion with Solitare and IA Integrelin. She required fluid boluses due to hypotension. .  Patient transferred to CIR on 07/07/2015 .    Patient currently requires total with basic self-care skills secondary to muscle weakness, impaired timing and sequencing, unbalanced muscle activation and decreased coordination, decreased visual perceptual skills, decreased visual motor skills, field cut and hemianopsia, decreased attention to left and left side neglect, decreased attention, decreased awareness, decreased problem solving, decreased safety awareness, decreased memory and delayed processing and decreased sitting balance, decreased standing balance, decreased postural control, hemiplegia and decreased balance strategies.  Prior to hospitalization, patient could complete ADLs with independent .  Patient will benefit from skilled intervention to decrease level of assist with basic self-care skills and increase independence with basic self-care skills prior to discharge home with care partner.  Anticipate  patient will require moderate physical assestance and follow up home health.  OT - End of Session Activity Tolerance: Tolerates 10 - 20 min activity with multiple rests Endurance Deficit: Yes OT Assessment Rehab Potential (ACUTE ONLY): Fair OT Patient demonstrates impairments in  the following area(s): Balance;Cognition;Endurance;Motor;Pain;Perception;Safety;Sensory;Vision OT Basic ADL's Functional Problem(s): Eating;Grooming;Bathing;Dressing;Toileting OT Transfers Functional Problem(s): Toilet;Tub/Shower OT Additional Impairment(s): Fuctional Use of Upper Extremity OT Plan OT Intensity: Minimum of 1-2 x/day, 45 to 90 minutes OT Frequency: 5 out of 7 days OT Duration/Estimated Length of Stay: 25-28 days OT Treatment/Interventions: Balance/vestibular training;Self Care/advanced ADL retraining;UE/LE Strength taining/ROM;UE/LE Coordination activities;Splinting/orthotics;Patient/family education;Functional electrical stimulation;Discharge planning;Therapeutic Activities;Functional mobility training;DME/adaptive equipment instruction;Neuromuscular re-education;Therapeutic Exercise;Cognitive remediation/compensation OT Self Feeding Anticipated Outcome(s): supervision OT Basic Self-Care Anticipated Outcome(s): mod assist OT Toileting Anticipated Outcome(s): mod assist OT Bathroom Transfers Anticipated Outcome(s): mod assist OT Recommendation Patient destination: Home Follow Up Recommendations: Home health OT Equipment Recommended: 3 in 1 bedside comode   Skilled Therapeutic Intervention Began work on selfcare re-training sit to stand at the sink this session.  Pt found slid down in recliner chair at start of session with waist belt in place.  Pt stated she slid down secondary to being uncomfortable.  Transferred to reclining wheelchair with total assist +2 (pt 20%).  Worked on UB bathing in supported sitting with mod assist.  Pt with increased LOB forward and to the left in sitting.  Pt with decreased sustained attention during session with internal distraction of neck pain.  [name] present educated him on maintaining left attention when up in the chair.  Pt left in reclining wheelchair, with safety belt in place, 1/2 lap tray in place to support the LUE, and call button  in reach.  Pt's spouse present as well.  Discussed use of Maximove with nursing to assist pt back to bed in 1/2 hour to 1 hour.    OT Evaluation Precautions/Restrictions  Precautions Precautions: Fall Precaution Comments: Lt neglect , poor trunk control, decreased awareness Restrictions Weight Bearing Restrictions: No  Pain Pain Assessment Pain Assessment: Faces Pain Score: 0-No pain Faces Pain Scale: Hurts even more Pain Type: Acute pain Pain Location: Neck Pain Descriptors / Indicators: Discomfort;Tiring Pain Onset: On-going Pain Intervention(s): Repositioned;Ambulation/increased activity Home Living/Prior Functioning Home Living Family/patient expects to be discharged to:: Private residence Living Arrangements: Spouse/significant other Available Help at Discharge: Family Type of Home: House Home Access: Ramped entrance [place] of Steps: [place]: One level Bathroom Shower/Tub: Chiropodist: Handicapped height (one bathroom with standard and the other handicapped height) Additional Comments: Per family pt was very independent PTA   Lives With: Spouse Prior Function Level of Independence: Independent with basic ADLs, Independent with gait, Independent with transfers  Able to Take Stairs?: Yes 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 ADL  See Function Section of Chart  Vision/Perception  Vision- History Baseline Vision/History: No visual deficits Vision- Assessment Vision Assessment?: Vision impaired- to be further tested in functional context  Cognition Overall Cognitive Status: Impaired/Different from baseline Arousal/Alertness: Awake/alert Orientation Level: Person;Place;Situation Person: Oriented Place: Oriented Situation: Oriented Year: 2017 Month: February Day of Week: Incorrect Memory: Impaired Memory Impairment: Decreased recall of new information Immediate Memory Recall:  Sock;Blue;Bed Memory Recall: Blue;Bed Memory Recall Blue: Without Cue Memory Recall Bed: Without Cue Attention: Sustained;Focused Focused Attention: Appears intact Sustained Attention: Impaired (pt internally distracted secondary to neck pain) Sustained Attention Impairment: Functional basic;Functional complex Awareness: Impaired Awareness Impairment: Intellectual impairment;Emergent impairment;Anticipatory impairment (Pt states she could get to  the bathroom with only a "little help".) Problem Solving: Impaired Problem Solving Impairment: Functional basic Behaviors: Restless Safety/Judgment: Impaired Sensation Sensation Light Touch: Impaired Detail Light Touch Impaired Details: Impaired LLE;Impaired LUE Stereognosis: Not tested Hot/Cold: Not tested Proprioception: Impaired Detail Proprioception Impaired Details: Impaired LUE;Impaired LLE Additional Comments: Pt unable to detect light touch or heavy pressure in the LUE with gross testing. Coordination Gross Motor Movements are Fluid and Coordinated: No Fine Motor Movements are Fluid and Coordinated: No Coordination and Movement Description: Pt with no noted active movement or functional use in the RUE during this session. Motor  Motor Motor: Hemiplegia;Abnormal postural alignment and control;Abnormal tone Motor - Skilled Clinical Observations: Lt hemiplegia, clonus present on L ankle Mobility  Transfers Transfers: Sit to Stand;Stand to Sit Sit to Stand: From chair/3-in-1;With upper extremity assist;1: +2 Total assist Sit to Stand: Patient Percentage: 20% Sit to Stand Details: Manual facilitation for weight shifting;Manual facilitation for placement Stand to Sit: With upper extremity assist;1: +2 Total assist Stand to Sit Details (indicate cue type and reason): Manual facilitation for weight shifting;Manual facilitation for placement  Trunk/Postural Assessment  Cervical Assessment Cervical Assessment: Exceptions to [facility] (head  turn to the the right at rest.  Decreased ability to maintain neutral cervical extension.  Increased pain as well reported) Thoracic Assessment Thoracic Assessment: Exceptions to [facility] (rounded thoracic region secondary to posture) Lumbar Assessment Lumbar Assessment: Exceptions to [facility] (maintains posterior pelvic tilt) Postural Control Postural Control: Deficits on evaluation Head Control: falls into flexion and extension, decreased ability to maintain neutral control Trunk Control: fall to the left in sitting Righting Reactions: absent Protective Responses: absent  Balance Balance Balance Assessed: Yes Static Sitting Balance Static Sitting - Balance Support: Right upper extremity supported Static Sitting - Level of Assistance: 2: Max assist Static Sitting - Comment/# of Minutes: max A for sitting edge of bed Dynamic Sitting Balance Dynamic Sitting - Level of Assistance: 1: +1 Total assist Sitting balance - Comments: max/total A for sitting and reaching Static Standing Balance Static Standing - Level of Assistance: 1: +2 Total assist Extremity/Trunk Assessment RUE Assessment RUE Assessment: Within Functional Limits (For gross functional use) LUE Assessment LUE Assessment: Exceptions to [facility] LUE Strength LUE Overall Strength Comments: Pt currently Brunnstrum stage I in the arm and hand at this time.  No functional attempted use of the LUE during selfcare tasks.    See Function Navigator for Current Functional Status.   Refer to Care Plan for Long Term Goals  Recommendations for other services: None  Discharge Criteria: Patient will be discharged from OT if patient refuses treatment 3 consecutive times without medical reason, if treatment goals not met, if there is a change in medical status, if patient makes no progress towards goals or if patient is discharged from hospital.  The above assessment, treatment plan, treatment alternatives and goals were discussed and mutually agreed  upon: by patient and by family  Chanze Teagle OTR/L 07/08/2015, 1:01 PM

## 2015-07-09 ENCOUNTER — Inpatient Hospital Stay (HOSPITAL_COMMUNITY): Payer: 59 | Admitting: Speech Pathology

## 2015-07-09 ENCOUNTER — Inpatient Hospital Stay (HOSPITAL_COMMUNITY): Payer: 59 | Admitting: Occupational Therapy

## 2015-07-09 ENCOUNTER — Inpatient Hospital Stay (HOSPITAL_COMMUNITY): Payer: 59

## 2015-07-09 ENCOUNTER — Inpatient Hospital Stay (HOSPITAL_COMMUNITY): Payer: 59 | Admitting: Physical Therapy

## 2015-07-09 LAB — GLUCOSE, CAPILLARY
GLUCOSE-CAPILLARY: 106 mg/dL — AB (ref 65–99)
GLUCOSE-CAPILLARY: 138 mg/dL — AB (ref 65–99)
Glucose-Capillary: 127 mg/dL — ABNORMAL HIGH (ref 65–99)
Glucose-Capillary: 137 mg/dL — ABNORMAL HIGH (ref 65–99)

## 2015-07-09 NOTE — Progress Notes (Signed)
Physical Therapy Session Note  Patient Details  Name: Becky Patterson MRN: [id number] Date of Birth: 1955/07/01  Today's Date: 07/09/2015 PT Individual Time: 0930-1033 PT Individual Time Calculation (min): 63 min   Short Term Goals: Week 1:  PT Short Term Goal 1 (Week 1): pt will maintain static sitting balance with min A PT Short Term Goal 2 (Week 1): Pt will perform functional transfers with max A PT Short Term Goal 3 (Week 1): pt will attend to therapeutic task x 1 minute with min cuing  Skilled Therapeutic Interventions/Progress Updates:    Patient in supine in bed rolling to L with cues and mod A for with use of railing.  Side to sit max A to +2 for balance, safety and scooting to EOB.  Transfer to TIS w/c +2 A squat pivot to R.  In therapy gym seated on mat for reaching to R and forward with visual scanning for retrieving and replacing playing cards.  Sitting balance activities all with max A for preventing LOB to R and forward.  Patient sit to supine on mat +2 A and performed rolling to R with max A and in sidelying participated in NMR as noted below.  Side to sit max A and pivot to w/c +2 A.  Seated in chair with feet up in armchair due to no leg rests and spouse in the room.  Therapy Documentation Precautions:  Precautions Precautions: Fall Precaution Comments: Lt neglect , poor trunk control, decreased awareness Restrictions Weight Bearing Restrictions: No Other Treatments:  Neuromuscular re-ed: patient seated edge of mat work on sitting balance with facilitation for R weight shift with input into ribs and head tilt to R for improved postural awareness and control.  Input into R ischial tuberosity for initiation of R weight shift and maintenance for sitting balance awareness and postural control.  R sidelying pelvic diagonals progressing from PROM to resisted ROM for improved awareness of R side, R side activation and coordination for trunk sequencing, etc.   See Function  Navigator for Current Functional Status.   Therapy/Group: Individual Therapy  [place], [place] 07/09/2015  07/09/2015, 12:37 PM

## 2015-07-09 NOTE — Progress Notes (Signed)
Occupational Therapy Session Note  Patient Details  Name: Becky Patterson MRN: [id number] Date of Birth: 09-Aug-1955  Today's Date: 07/09/2015 OT Individual Time: 1100-1200 OT Individual Time Calculation (min): 60 min    Short Term Goals: Week 1:  OT Short Term Goal 1 (Week 1): Pt will maintain static sitting balance EOB for 2 mins with close supervision in preparation for selfcare tasks.  OT Short Term Goal 2 (Week 1): Pt will complete UB bathing and dressing sitting supported with min assist. OT Short Term Goal 3 (Week 1): Pt will transfer squat pivot to the drop arm commode with max assist of 1 person.  OT Short Term Goal 4 (Week 1): Pt will locate all grooming and bathing items placed left of midline with no more than mod instructional cueing. OT Short Term Goal 5 (Week 1): Pt's spouse will return demonstrate safe assist with LUE PROM exercises following handout.    Skilled Therapeutic Interventions/Progress Updates:    Pt worked on bathing and dressing supine to sit EOB.  Total assist +2 for squat pivot transfer from tilt in space wheelchair to EOB.  She needed +2 assist as well to transition to supine for washing peri area and changing brief.  Max instructional cueing and mod assist needed for rolling to the left with total assist for rolling to the right.  Therapist assisted with perianal area and pt was able to wash her front peri area with setup and mod instructional cueing.  Incorporated visual scanning to the left to locate washcloths and towels during this task as well as all other bathing and dressing.  Once brief was donned, transitioned to the EOB for the rest of bathing and dressing.  Total assist for sitting balance unsupported with LOB to the left as well as forward and backwards.  Max hand over hand assist to incorporate LUE while second person was needed to stabilize her trunk.  Donned pullover shirt with max demonstrational cueing and max assist as well.  She was able to hold  onto the pants with the RUE while lifting the RLE to place it in the leg hole.  Total assist to balance while doing this.  Pt stood with total +2 "three muskateers" technique for pulling pants over her hips.  Pt left in supine with HOB up.  Educated pt's spouse briefly on RUE and RLE positioning in bed as well as PROM exercises that could be completed with the LUE.  He did not return demonstrate this session.    Therapy Documentation Precautions:  Precautions Precautions: Fall Precaution Comments: Lt neglect , poor trunk control, decreased awareness Restrictions Weight Bearing Restrictions: No  Pain: Pain Assessment Pain Assessment: No/denies pain ADL: See Function Navigator for Current Functional Status.   Therapy/Group: Individual Therapy  Inman Fettig OTR/L 07/09/2015, 12:18 PM

## 2015-07-09 NOTE — Patient Care Conference (Signed)
Inpatient RehabilitationTeam Conference and Plan of Care Update Date: 07/09/2015   Time: 10:45 AM    Patient Name: Becky Patterson      Medical Record Number: [id number]  Date of Birth: [birth year] Sex: Female         Room/Bed: 4W18C/4W18C-01 Payor Info: Payor: [employer] / Plan: [employer] OTHER / Product Type: *No Product type* /    Admitting Diagnosis: RT CVA  Admit Date/Time:  07/07/2015  3:54 PM Admission Comments: No comment available   Primary Diagnosis:  <principal problem not specified> Principal Problem: <principal problem not specified>  Patient Active Problem List   Diagnosis Date Noted  . Acute right MCA stroke (Becky Patterson) 07/07/2015  . Intracranial septic embolism (Becky Patterson)   . Hemiplegia affecting left nondominant side (Becky Patterson)   . Dysphagia   . Alteration of sensation as late effect of stroke   . Dehydration   . Bacterial endocarditis   . Maculopapular rash   . Acute blood loss anemia   . Other vascular headache   . Other complicated headache syndrome   . 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 Left hemiparesis (Becky Patterson) 07/03/2015  . 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 Visual field cut 07/03/2015  . Palliative care encounter   . DNR (do not resuscitate) discussion   . Acute respiratory failure with hypoxia (Becky Patterson) 06/30/2015  . Arterial hypotension 06/30/2015  . Acute encephalopathy   . Stroke due to embolism (Becky Patterson)   . Stroke (cerebrum) (Becky Patterson) 06/29/2015  . Colitis   . Pyelonephritis   . Enterococcal bacteremia 06/19/2015  . Sepsis secondary to UTI (Becky Patterson)   . Mass of lower lobe of left lung   . Tobacco abuse   . Anemia of chronic disease   . Hyponatremia   . Hyperglycemia   . Hypokalemia   . Lung mass 06/15/2015    Expected Discharge Date: Expected Discharge Date: 08/05/15  Team Members Present: Physician leading conference: Dr. Alysia Penna Social Worker Present: Ovidio Kin, LCSW Nurse Present: Dorien Chihuahua, RN PT Present: Other  (comment) (Cyndi Wynn-PT) OT Present: Clyda Greener, OT SLP Present: Windell Moulding, SLP PPS Coordinator present : Daiva Nakayama, RN, CRRN     Current Status/Progress Goal Weekly Team Focus  Medical   severe neglect, poor awareness  Home at mod A level  Increased therapy tolerance   Bowel/Bladder   incontinent of bowel and bladder LBM 2/14  Timed toileting q 3 hours and after meals  Encourage patient to be continent by timed toileting   Swallow/Nutrition/ Hydration   Dys 3, honey thick liquids; full supervision, husband and sister signed off to supervise pt during meals.    supervision with least restrictive diet   therapeutic trials of ice chips, toleration of currently prescribed diet    ADL's   Mod to max assist UB selfcare, total assist +2 for LB selfcare.  No active movement noted in the LUE at this time.  Left neglect also noted as well.  Max to total assist for sitting balance as well.   min to mod assist for most selfcare tasks  selfcare re-training, transfer training, balance re-training, pt/family education   Mobility   +2 total A for transfers, max A sitting balance  min A  balance, functional transfers, attention   Communication             Safety/Cognition/ Behavioral Observations  Max assist for basic tasks   min assist for basic   left inattention, recall, awareness of deficits, impulsivity, intiaition  Pain   Occasional headache  Pain <4  Assess pain q shift and PRN   Skin   Groin area with MASD-mgp in use, and sacrum allevyn buttocks blanchable  Turn q 2 hrs, and elevate heels  Continue to monitor and assess skin q shift and PRN.       *See Care Plan and progress notes for long and short-term goals.  Barriers to Discharge: severe hemi    Possible Resolutions to Barriers:  will need long LOS    Discharge Planning/Teaching Needs:  Family wants to take home-sister and husband to provide care. It will depend upon level pt is at and if a realistic goal for discharge  home from rehab. Husband is aware she will require 24 hr physical care      Team Discussion:  Goals-min/mod wheelchair level of assist-no gait goals at this time. Husbnand and sister checked off to assist pt with her meals. MBS sometime next week. BS flucuating adjusting medicines. Lack awareness of deficits and neglect of left side. Working on trunk stability with sitting. Total plus 2 currently. Family member always here with pt.   Revisions to Treatment Plan:  First conference-none   Continued Need for Acute Rehabilitation Level of Care: The patient requires daily medical management by a physician with specialized training in physical medicine and rehabilitation for the following conditions: Daily direction of a multidisciplinary physical rehabilitation program to ensure safe treatment while eliciting the highest outcome that is of practical value to the patient.: Yes Daily medical management of patient stability for increased activity during participation in an intensive rehabilitation regime.: Yes Daily analysis of laboratory values and/or radiology reports with any subsequent need for medication adjustment of medical intervention for : Mood/behavior problems;Neurological problems;Diabetes problems;Other  Becky Patterson 07/09/2015, 12:26 PM

## 2015-07-09 NOTE — Progress Notes (Signed)
Social Work Patient ID: Caralee Ates, female   DOB: 02-13-1956, 60 y.o.   MRN: [id number] Met with Rosalie Gums and husband along with pt listening in between sleeping to discuss team conference goals-min/mod level and wheelchair level goals currently unless makes much progress to  Make gait goals later. target discharge date is 3/14. Made all aware pt will require 24 hr physical care at discharge. Also contacted sister via telephone to update her since she requested this. All want to definitely Take pt home and provide care. Sister reports they have a niece, pt's daughter and sister in-law who may be able to assist them also. All want to focus on the positive and hope she will do well here. Aware closer to discharge will need to do hands on care. Husband and sister have been checked off to be able to feed pt. Will work on providing support and discharge planning needs.

## 2015-07-09 NOTE — Progress Notes (Signed)
Physical Therapy Session Note  Patient Details  Name: Becky Patterson MRN: [id number] Date of Birth: 09/03/1955  Today's Date: 07/09/2015 PT Individual Time: 1500-1530 PT Individual Time Calculation (min): 30 min   Short Term Goals: Week 1:  PT Short Term Goal 1 (Week 1): pt will maintain static sitting balance with min A PT Short Term Goal 2 (Week 1): Pt will perform functional transfers with max A PT Short Term Goal 3 (Week 1): pt will attend to therapeutic task x 1 minute with min cuing  Skilled Therapeutic Interventions/Progress Updates:    Pt received in bed and agreeable to therapy session.  Session focus on attention and functional mobility.  Roll to L with mod assist for LEs and verbal cues for sequencing and positioning of UEs.  Side lying>sit with 2 helpers for LEs and trunk control. Pt requires max assist for sitting balance EOB.  Squat/pivot from bed>w/c with 2 helpers total assist.  PT engaged pt in L attention activities using mirror and whiteboard positioned to L side.  Pt requires max multimodal cues to attend to L side.  Pt returned to room at end of session and positioned in TIS w/c with call bell in reach and needs met.  Family present in room and aware to call nursing for back to bed before leaving.    Therapy Documentation Precautions:  Precautions Precautions: Fall Precaution Comments: Lt neglect , poor trunk control, decreased awareness Restrictions Weight Bearing Restrictions: No See Function Navigator for Current Functional Status.   Therapy/Group: Individual Therapy  [name] 07/09/2015, 4:28 PM

## 2015-07-09 NOTE — Progress Notes (Signed)
Subjective/Complaints: Slept fair last noc HA ~3am today ROS- No SOB/CP No abd pain, - N/V, no blood in stool, stools dark  Objective: Vital Signs: Blood pressure 135/67, pulse 85, temperature 98.4 F (36.9 C), temperature source Oral, resp. rate 18, height '5\' 1"'  (1.549 m), weight 91.4 kg (201 lb 8 oz), SpO2 96 %. No results found. Results for orders placed or performed during the hospital encounter of 07/07/15 (from the past 72 hour(s))  Glucose, capillary     Status: None   Collection Time: 07/07/15  4:29 PM  Result Value Ref Range   Glucose-Capillary 91 65 - 99 mg/dL   Comment 1 Notify RN   Glucose, capillary     Status: Abnormal   Collection Time: 07/07/15  8:54 PM  Result Value Ref Range   Glucose-Capillary 163 (H) 65 - 99 mg/dL  CBC WITH DIFFERENTIAL     Status: Abnormal   Collection Time: 07/08/15  6:07 AM  Result Value Ref Range   WBC 8.1 4.0 - 10.5 K/uL   RBC 2.49 (L) 3.87 - 5.11 MIL/uL   Hemoglobin 7.6 (L) 12.0 - 15.0 g/dL   HCT 23.4 (L) 36.0 - 46.0 %   MCV 94.0 78.0 - 100.0 fL   MCH 30.5 26.0 - 34.0 pg   MCHC 32.5 30.0 - 36.0 g/dL   RDW 15.4 11.5 - 15.5 %   Platelets 323 150 - 400 K/uL   Neutrophils Relative % 64 %   Neutro Abs 5.2 1.7 - 7.7 K/uL   Lymphocytes Relative 25 %   Lymphs Abs 2.0 0.7 - 4.0 K/uL   Monocytes Relative 7 %   Monocytes Absolute 0.6 0.1 - 1.0 K/uL   Eosinophils Relative 3 %   Eosinophils Absolute 0.3 0.0 - 0.7 K/uL   Basophils Relative 1 %   Basophils Absolute 0.1 0.0 - 0.1 K/uL  Comprehensive metabolic panel     Status: Abnormal   Collection Time: 07/08/15  6:07 AM  Result Value Ref Range   Sodium 138 135 - 145 mmol/L   Potassium 3.4 (L) 3.5 - 5.1 mmol/L   Chloride 103 101 - 111 mmol/L   CO2 24 22 - 32 mmol/L   Glucose, Bld 111 (H) 65 - 99 mg/dL   BUN 6 6 - 20 mg/dL   Creatinine, Ser 0.63 0.44 - 1.00 mg/dL   Calcium 8.1 (L) 8.9 - 10.3 mg/dL   Total Protein 5.6 (L) 6.5 - 8.1 g/dL   Albumin 1.9 (L) 3.5 - 5.0 g/dL   AST 18 15 -  41 U/L   ALT 18 14 - 54 U/L   Alkaline Phosphatase 66 38 - 126 U/L   Total Bilirubin 0.4 0.3 - 1.2 mg/dL   GFR calc non Af Amer >60 >60 mL/min   GFR calc Af Amer >60 >60 mL/min    Comment: (NOTE) The eGFR has been calculated using the CKD EPI equation. This calculation has not been validated in all clinical situations. eGFR's persistently <60 mL/min signify possible Chronic Kidney Disease.    Anion gap 11 5 - 15  Glucose, capillary     Status: Abnormal   Collection Time: 07/08/15  6:34 AM  Result Value Ref Range   Glucose-Capillary 106 (H) 65 - 99 mg/dL  Glucose, capillary     Status: Abnormal   Collection Time: 07/08/15 11:53 AM  Result Value Ref Range   Glucose-Capillary 129 (H) 65 - 99 mg/dL   Comment 1 Notify RN   Glucose, capillary  Status: Abnormal   Collection Time: 07/08/15  5:21 PM  Result Value Ref Range   Glucose-Capillary 147 (H) 65 - 99 mg/dL   Comment 1 Notify RN   Glucose, capillary     Status: Abnormal   Collection Time: 07/08/15  8:59 PM  Result Value Ref Range   Glucose-Capillary 120 (H) 65 - 99 mg/dL     HEENT: normal Cardio: RRR and no murmur Resp: CTA B/L and unlabored GI: BS positive and NT, ND Extremity:  No Edema Skin:   Intact and Other no heel breakdown Neuro: Lethargic, Confused, Flat, Cranial Nerve Abnormalities Left CN central 7, Abnormal Sensory reduced pinch sensation in LUE and LLE, Abnormal Motor 0/5 LUE and LLE, Tone:  clonus in Left ankle, Dysarthric and Inattention Musc/Skel:  Other no pain with LUE or LLE ROM Gen NAD   Assessment/Plan: 1. Functional deficits secondary to Right MCA infarct with Left HP, Left neglect, Left hemisensory deficits which require 3+ hours per day of interdisciplinary therapy in a comprehensive inpatient rehab setting. Physiatrist is providing close team supervision and 24 hour management of active medical problems listed below. Physiatrist and rehab team continue to assess barriers to discharge/monitor  patient progress toward functional and medical goals. FIM: Function - Bathing Position: Wheelchair/chair at sink Body parts bathed by patient: Chest, Abdomen, Left arm Body parts bathed by helper: Back Bathing not applicable: Front perineal area, Buttocks, Right upper leg, Left upper leg, Right lower leg, Left lower leg (Did not attempt secondary to decreased time) Assist Level: 2 helpers  Function- Upper Body Dressing/Undressing What is the patient wearing?: Pull over shirt/dress Pull over shirt/dress - Perfomed by patient: Thread/unthread right sleeve Pull over shirt/dress - Perfomed by helper: Thread/unthread left sleeve, Put head through opening, Pull shirt over trunk Function - Lower Body Dressing/Undressing What is the patient wearing?: Pants, Non-skid slipper socks Position: Wheelchair/chair at sink Pants- Performed by helper: Thread/unthread right pants leg, Thread/unthread left pants leg, Pull pants up/down Non-skid slipper socks- Performed by helper: Don/doff right sock, Don/doff left sock Assist for lower body dressing: 2 Helpers  Function - Toileting Toileting activity did not occur: No continent bowel/bladder event  Function - Air cabin crew transfer activity did not occur: Safety/medical concerns  Function - Chair/bed transfer Chair/bed transfer assist level: 2 helpers Chair/bed transfer assistive device: Facilities manager lift: Maximove  Function - Locomotion: Wheelchair Will patient use wheelchair at discharge?: Yes Type: Manual Max wheelchair distance: 15 Assist Level: Maximal assistance (Pt 25 - 49%) Function - Locomotion: Ambulation Ambulation activity did not occur: Safety/medical concerns  Function - Comprehension Comprehension: Auditory Comprehension assist level: Understands basic 75 - 89% of the time/ requires cueing 10 - 24% of the time  Function - Expression Expression: Verbal Expression assist level: Expresses basic 75 - 89% of  the time/requires cueing 10 - 24% of the time. Needs helper to occlude trach/needs to repeat words.  Function - Social Interaction Social Interaction assist level: Interacts appropriately 25 - 49% of time - Needs frequent redirection.  Function - Problem Solving Problem solving assist level: Solves basic 25 - 49% of the time - needs direction more than half the time to initiate, plan or complete simple activities  Function - Memory Memory assist level: Recognizes or recalls 25 - 49% of the time/requires cueing 50 - 75% of the time Patient normally able to recall (first 3 days only): Current season, Staff names and faces, That he or she is in a hospital  Medical Problem  List and Plan: 1.  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 No antithrombotic needed. Team conference today please see physician documentation under team conference tab, met with team face-to-face to discuss problems,progress, and goals. Formulized individual treatment plan based on medical history, underlying problem and comorbidities. 2.  DVT Prophylaxis/Anticoagulation: Pharmaceutical: Lovenox 3. Pain Management: Has chronic back pain per family. Will use tylenol prn for now.   4. Mood: LCSW to follow for evaluation and support.   5. Neuropsych: This patient is not fully capable of making decisions on her own behalf. 6. Skin/Wound Care: routine pressure relief measures.   7. Fluids/Electrolytes/Nutrition: Monitor I/O. Getting dehydrated with honey liquids. Will need IVF at nights and need to encourage fluid intake during the day. Check lytes in am.   8. 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 PET scan on outpatient basis. 10. Hypokalemia: K+ 3.4 this am. Check labs this pm and again in am, supplement orally 11. Cytotoxic edema: Weaned of hypertonic solution.    12.  Maculopapular rash:  Likely due to antibiotics per ID and itching being managed with prn benadryl. Antibiotics narrowed to Ceftriaxone on 02/11--monitor for now. IF rash recurs will need to change back to imipenem.   Antibiotic D # 24--End date March 7th.   13. Anemia: Hgb low but has stayed in 7.6-8.0 range, check stool guaic was pos 1/23, neg on 2/2 14. Hypotension: Cont to monitor 15. Headache: Will start topamax, this likely in part due to cerebral edema which will slowly subside   LOS (Days) 2 A FACE TO FACE EVALUATION WAS PERFORMED  Sirenia Whitis E 07/09/2015, 7:03 AM

## 2015-07-09 NOTE — Progress Notes (Signed)
Speech Language Pathology Daily Session Note  Patient Details  Name: Becky Patterson MRN: [id number] Date of Birth: 1955-10-17  Today's Date: 07/09/2015 SLP Individual Time: 0803-0903 SLP Individual Time Calculation (min): 60 min  Short Term Goals: Week 1: SLP Short Term Goal 1 (Week 1): Pt will attend to the left in >50% of observable opportunities with max assist verbal cues.   SLP Short Term Goal 2 (Week 1): Pt will sustain her attention to basic familiar tasks for 3-5 minute intervals with mod assist verbal cues for redirection.  SLP Short Term Goal 3 (Week 1): Pt will consume her currently prescribed diet with min verbal cues for use of swallowing precautions.   SLP Short Term Goal 4 (Week 1): Pt will consume therapeutic trials of ice chips with minimal overt s/s of aspiration to determine readiness for repeat objective study.  SLP Short Term Goal 5 (Week 1): Pt will identify at least 2 deficits occurring s/p CVA with mod question cues.    Skilled Therapeutic Interventions:  Pt was seen for skilled ST targeting goals for cognition and dysphagia.  [name] and sister were present; husband was feeding pt a bite of sausage biscuit but pt was positioned appropriately in bed, TV was off, and pt's husband was closely supervising pt.  Husband was encouraged to allow pt to feed herself and SLP provided assistance with tray set up to maximize pt's functional independence for self feeding.  [name] and sister both provided appropriate cues for rate and portion control to maximize pt's safety when consuming her currently prescribed diet.  [name] also correctly returned demonstration of how to thicken liquids x2 with pt's coffee with mod I.  No overt s/s of aspiration evident with solids or liquids.  Pt's sister now signed off to supervise pt during meal as well as pt's husband.  Pt continues to require max assist verbal, visual, and tactile cues to attend to the left of her body and the  environment; however, she was noted with longer periods of sustained attention today (~3 minutes) in comparison to initial evaluation with overall mod assist verbal cues for redirection.  SLP facilitated the session with a basic money management task to address sustained attention and visual scanning to the left.  Pt required overall max assist verbal cues to locate coins on the left side of her tray and for working memory when completing basic, functional math calculations.  Pt was left in bed with call bell within reach and bed alarm set.  Continue per current plan of care.      Function:  Eating Eating   Modified Consistency Diet: Yes Eating Assist Level: Helper checks for pocketed food;Help with picking up utensils;Supervision or verbal cues;Set up assist for   Eating Set Up Assist For: Opening containers       Cognition Comprehension Comprehension assist level: Understands basic 75 - 89% of the time/ requires cueing 10 - 24% of the time  Expression   Expression assist level: Expresses basic 75 - 89% of the time/requires cueing 10 - 24% of the time. Needs helper to occlude trach/needs to repeat words.  Social Interaction Social Interaction assist level: Interacts appropriately 25 - 49% of time - Needs frequent redirection.  Problem Solving Problem solving assist level: Solves basic 25 - 49% of the time - needs direction more than half the time to initiate, plan or complete simple activities  Memory Memory assist level: Recognizes or recalls 25 - 49% of the time/requires cueing  50 - 75% of the time    Pain Pain Assessment Pain Assessment: No/denies pain  Therapy/Group: Individual Therapy  Adaja Wander, Selinda Orion 07/09/2015, 11:32 AM

## 2015-07-10 ENCOUNTER — Inpatient Hospital Stay (HOSPITAL_COMMUNITY): Payer: 59 | Admitting: Physical Therapy

## 2015-07-10 ENCOUNTER — Inpatient Hospital Stay (HOSPITAL_COMMUNITY): Payer: 59 | Admitting: Occupational Therapy

## 2015-07-10 ENCOUNTER — Inpatient Hospital Stay (HOSPITAL_COMMUNITY): Payer: 59 | Admitting: Speech Pathology

## 2015-07-10 DIAGNOSIS — M797 Fibromyalgia: Secondary | ICD-10-CM

## 2015-07-10 LAB — OCCULT BLOOD X 1 CARD TO LAB, STOOL: Fecal Occult Bld: NEGATIVE

## 2015-07-10 LAB — GLUCOSE, CAPILLARY
GLUCOSE-CAPILLARY: 156 mg/dL — AB (ref 65–99)
GLUCOSE-CAPILLARY: 109 mg/dL — AB (ref 65–99)
GLUCOSE-CAPILLARY: 162 mg/dL — AB (ref 65–99)
Glucose-Capillary: 103 mg/dL — ABNORMAL HIGH (ref 65–99)

## 2015-07-10 LAB — BASIC METABOLIC PANEL
ANION GAP: 12 (ref 5–15)
BUN: 6 mg/dL (ref 6–20)
CHLORIDE: 100 mmol/L — AB (ref 101–111)
CO2: 25 mmol/L (ref 22–32)
Calcium: 9 mg/dL (ref 8.9–10.3)
Creatinine, Ser: 0.8 mg/dL (ref 0.44–1.00)
Glucose, Bld: 186 mg/dL — ABNORMAL HIGH (ref 65–99)
POTASSIUM: 4 mmol/L (ref 3.5–5.1)
SODIUM: 137 mmol/L (ref 135–145)

## 2015-07-10 MED ORDER — TOPIRAMATE 25 MG PO TABS
50.0000 mg | ORAL_TABLET | Freq: Two times a day (BID) | ORAL | Status: DC
  Administered 2015-07-10 – 2015-07-14 (×9): 50 mg via ORAL
  Filled 2015-07-10 (×11): qty 2

## 2015-07-10 MED ORDER — MUSCLE RUB 10-15 % EX CREA
TOPICAL_CREAM | Freq: Two times a day (BID) | CUTANEOUS | Status: DC | PRN
  Filled 2015-07-10 (×2): qty 85

## 2015-07-10 MED ORDER — HYDROCORTISONE 1 % EX CREA
TOPICAL_CREAM | Freq: Three times a day (TID) | CUTANEOUS | Status: DC
  Administered 2015-07-10 – 2015-07-15 (×12): via TOPICAL
  Administered 2015-07-16: 1 via TOPICAL
  Administered 2015-07-17 – 2015-07-30 (×4): via TOPICAL
  Filled 2015-07-10 (×2): qty 28

## 2015-07-10 NOTE — Progress Notes (Signed)
Speech Language Pathology Daily Session Note  Patient Details  Name: Becky Patterson MRN: [id number] Date of Birth: 11/19/1955  Today's Date: 07/10/2015 SLP Individual Time: 8250-5397 SLP Individual Time Calculation (min): 44 min  Short Term Goals: Week 1: SLP Short Term Goal 1 (Week 1): Pt will attend to the left in >50% of observable opportunities with max assist verbal cues.   SLP Short Term Goal 2 (Week 1): Pt will sustain her attention to basic familiar tasks for 3-5 minute intervals with mod assist verbal cues for redirection.  SLP Short Term Goal 3 (Week 1): Pt will consume her currently prescribed diet with min verbal cues for use of swallowing precautions.   SLP Short Term Goal 4 (Week 1): Pt will consume therapeutic trials of ice chips with minimal overt s/s of aspiration to determine readiness for repeat objective study.  SLP Short Term Goal 5 (Week 1): Pt will identify at least 2 deficits occurring s/p CVA with mod question cues.    Skilled Therapeutic Interventions:  Pt was seen for skilled ST targeting cognitive goals.  Upon arrival, pt was seated upright in bed with husband and sister providing close supervision while she finished breakfast.  Pt demonstrated x1 cough upon arrival, unclear whether it was related to POs, but family reported overall good toleration of breakfast with no other overt s/s of aspiration.  Pt attended to both sides of her oral cavity when completing oral care after breakfast with set up assist.  SLP facilitated the session with a basic sorting task targeting visual scanning to the left of midline and sustained attention to task.  Pt required max faded to mod assist verbal cues to scan to the left.  She sustained her attention to task for ~30 second intervals with mod verbal cues needed for redirection.  Pt was 100% accurate for sorting cards into groups of 2 by color with supervision.  Pt required max assist verbal, visual and tactile cues for use of external  aids to recall therapist's name, discipline and activities from previous therapy session.  Pt was left in bed with bed alarm set and call bell within reach.  Continue per current plan of care.    Function:  Eating Eating             Cognition Comprehension Comprehension assist level: Understands basic 75 - 89% of the time/ requires cueing 10 - 24% of the time  Expression   Expression assist level: Expresses basic 75 - 89% of the time/requires cueing 10 - 24% of the time. Needs helper to occlude trach/needs to repeat words.  Social Interaction Social Interaction assist level: Interacts appropriately 25 - 49% of time - Needs frequent redirection.  Problem Solving Problem solving assist level: Solves basic 50 - 74% of the time/requires cueing 25 - 49% of the time  Memory Memory assist level: Recognizes or recalls 25 - 49% of the time/requires cueing 50 - 75% of the time    Pain Pain Assessment Pain Assessment: No/denies pain  Therapy/Group: Individual Therapy  Edmund Rick, Selinda Orion 07/10/2015, 12:25 PM

## 2015-07-10 NOTE — IPOC Note (Signed)
Overall Plan of Care [facility]) Patient Details Name: Becky Patterson MRN: [id number] DOB: 26-Jun-1955  Admitting Diagnosis: RT CVA  Hospital Problems: Active Problems:   Acute right MCA stroke ([place])   Intracranial septic embolism ([place])   Hemiplegia affecting left nondominant side ([facility])   Dysphagia   Alteration of sensation as late effect of stroke   Dehydration   Bacterial endocarditis   Maculopapular rash   Acute blood loss anemia   Other vascular headache     Functional Problem List: Nursing Bladder, Bowel, Endurance, Motor, Nutrition, Pain, Safety, Skin Integrity  PT Balance, Endurance, Motor, Pain, Safety, Sensory  OT Balance, Cognition, Endurance, Motor, Pain, Perception, Safety, Sensory, Vision  SLP Cognition, Nutrition, Linguistic  TR         Basic ADL's: OT Eating, Grooming, Bathing, Dressing, Toileting     Advanced  ADL's: OT       Transfers: PT Bed Mobility, Bed to Chair, Car, Manufacturing systems engineer, Metallurgist: PT Ambulation, Emergency planning/management officer, Stairs     Additional Impairments: OT Fuctional Use of Upper Extremity  SLP Swallowing, Communication, Social Cognition expression Problem Solving, Memory, Attention, Awareness  TR      Anticipated Outcomes Item Anticipated Outcome  Self Feeding supervision  Swallowing  Supervision    Basic self-care  mod assist  Toileting  mod assist   Bathroom Transfers mod assist  Bowel/Bladder  Manage output with max. assisst using incontinent aids.  Transfers  min A  Locomotion  supervision w/c  Communication     Cognition  Min assist   Pain  Less than 3 on scale 1 to 10.  Safety/Judgment  Free from fall during her stay in rehab.   Therapy Plan: PT Intensity: Minimum of 1-2 x/day ,45 to 90 minutes PT Frequency: 5 out of 7 days PT Duration Estimated Length of Stay: 21-28 days OT Intensity: Minimum of 1-2 x/day, 45 to 90 minutes OT Frequency: 5 out of 7 days OT Duration/Estimated Length  of Stay: 25-28 days SLP Intensity: Minumum of 1-2 x/day, 30 to 90 minutes SLP Frequency: 3 to 5 out of 7 days SLP Duration/Estimated Length of Stay: 21-28 days        Team Interventions: Nursing Interventions Patient/Family Education, Bladder Management, Bowel Management, Disease Management/Prevention, Pain Management, Medication Management, Skin Care/Wound Management, Dysphagia/Aspiration Precaution Training, Discharge Planning  PT interventions Ambulation/gait training, Discharge planning, Functional mobility training, Therapeutic Activities, Therapeutic Exercise, Wheelchair propulsion/positioning, Neuromuscular re-education, Training and development officer, Cognitive remediation/compensation, Community reintegration, Technical sales engineer stimulation, Patient/family education, IT trainer, UE/LE Coordination activities, UE/LE Strength taining/ROM, Splinting/orthotics, Pain management, DME/adaptive equipment instruction  OT Interventions Balance/vestibular training, Self Care/advanced ADL retraining, UE/LE Strength taining/ROM, UE/LE Coordination activities, Splinting/orthotics, Patient/family education, Functional electrical stimulation, Discharge planning, Therapeutic Activities, Functional mobility training, DME/adaptive equipment instruction, Neuromuscular re-education, Therapeutic Exercise, Cognitive remediation/compensation  SLP Interventions Cognitive remediation/compensation, Cueing hierarchy, Functional tasks, Environmental controls, Dysphagia/aspiration precaution training, Internal/external aids, Patient/family education  TR Interventions    SW/CM Interventions Discharge Planning, Psychosocial Support, Patient/Family Education    Team Discharge Planning: Destination: PT-  ,OT- Home , SLP-Home Projected Follow-up: PT- , OT-  Home health OT, SLP-24 hour supervision/assistance, Home Health SLP, Outpatient SLP Projected Equipment Needs: PT- , OT- 3 in 1 bedside comode, SLP-To be  determined Equipment Details: PT- , OT-  Patient/family involved in discharge planning: PT-  ,  OT-Patient, Family member/caregiver, SLP-Patient, Family member/caregiver  MD ELOS: 19-21d Medical Rehab Prognosis:  Fair Assessment: 60 y.o. 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 She was admitted on 06/29/15 with acute onset of left sided weakness with slurred speech and left neglect. UDS positive for THC. CT head negative and she underwent CTA brain showed M1 occlusion and Dr. Estanislado Pandy attempted revascularization of the right MCA occlusion with Solitare and IA Integrelin. She required fluid boluses due to hypotension. She developed diffuse rash question due reaction from transfusion v/s antibiotics. Dr. Julio Alm consulted for input and recommended switching to carbapenem and repeat TTE at end of course of antibiotics. Follow up CCT with large posterior inferior right MCA infarct with evolution and no hemorrhage. MRI brain showed large R-MCA infarct with local mass effect on right lateral ventricle and mild right uncal herniation as well as non hemorrhagic left subinsular/ perioperculum infarct. Dr Leonie Man feels that infarct due to septic emboli and no antithrombotic due to risk of bleeding. She was weaned off vent on 02/08    Now requiring 24/7 Rehab RN,MD, as well as CIR level PT, OT and SLP.  Treatment team will focus on ADLs and mobility with goals set at [facility]   See Team Conference Notes for weekly updates to the plan of care

## 2015-07-10 NOTE — Progress Notes (Signed)
Subjective/Complaints: No issues overnite except some headache radiating to R side of head ROS- No SOB/CP No abd pain, - N/V, no blood in stool, stools dark  Objective: Vital Signs: Blood pressure 128/71, pulse 89, temperature 98.5 F (36.9 C), temperature source Oral, resp. rate 18, height '5\' 1"'  (1.549 m), weight 91.5 kg (201 lb 11.5 oz), SpO2 97 %. No results found. Results for orders placed or performed during the hospital encounter of 07/07/15 (from the past 72 hour(s))  Glucose, capillary     Status: None   Collection Time: 07/07/15  4:29 PM  Result Value Ref Range   Glucose-Capillary 91 65 - 99 mg/dL   Comment 1 Notify RN   Glucose, capillary     Status: Abnormal   Collection Time: 07/07/15  8:54 PM  Result Value Ref Range   Glucose-Capillary 163 (H) 65 - 99 mg/dL  CBC WITH DIFFERENTIAL     Status: Abnormal   Collection Time: 07/08/15  6:07 AM  Result Value Ref Range   WBC 8.1 4.0 - 10.5 K/uL   RBC 2.49 (L) 3.87 - 5.11 MIL/uL   Hemoglobin 7.6 (L) 12.0 - 15.0 g/dL   HCT 23.4 (L) 36.0 - 46.0 %   MCV 94.0 78.0 - 100.0 fL   MCH 30.5 26.0 - 34.0 pg   MCHC 32.5 30.0 - 36.0 g/dL   RDW 15.4 11.5 - 15.5 %   Platelets 323 150 - 400 K/uL   Neutrophils Relative % 64 %   Neutro Abs 5.2 1.7 - 7.7 K/uL   Lymphocytes Relative 25 %   Lymphs Abs 2.0 0.7 - 4.0 K/uL   Monocytes Relative 7 %   Monocytes Absolute 0.6 0.1 - 1.0 K/uL   Eosinophils Relative 3 %   Eosinophils Absolute 0.3 0.0 - 0.7 K/uL   Basophils Relative 1 %   Basophils Absolute 0.1 0.0 - 0.1 K/uL  Comprehensive metabolic panel     Status: Abnormal   Collection Time: 07/08/15  6:07 AM  Result Value Ref Range   Sodium 138 135 - 145 mmol/L   Potassium 3.4 (L) 3.5 - 5.1 mmol/L   Chloride 103 101 - 111 mmol/L   CO2 24 22 - 32 mmol/L   Glucose, Bld 111 (H) 65 - 99 mg/dL   BUN 6 6 - 20 mg/dL   Creatinine, Ser 0.63 0.44 - 1.00 mg/dL   Calcium 8.1 (L) 8.9 - 10.3 mg/dL   Total Protein 5.6 (L) 6.5 - 8.1 g/dL   Albumin  1.9 (L) 3.5 - 5.0 g/dL   AST 18 15 - 41 U/L   ALT 18 14 - 54 U/L   Alkaline Phosphatase 66 38 - 126 U/L   Total Bilirubin 0.4 0.3 - 1.2 mg/dL   GFR calc non Af Amer >60 >60 mL/min   GFR calc Af Amer >60 >60 mL/min    Comment: (NOTE) The eGFR has been calculated using the CKD EPI equation. This calculation has not been validated in all clinical situations. eGFR's persistently <60 mL/min signify possible Chronic Kidney Disease.    Anion gap 11 5 - 15  Glucose, capillary     Status: Abnormal   Collection Time: 07/08/15  6:34 AM  Result Value Ref Range   Glucose-Capillary 106 (H) 65 - 99 mg/dL  Glucose, capillary     Status: Abnormal   Collection Time: 07/08/15 11:53 AM  Result Value Ref Range   Glucose-Capillary 129 (H) 65 - 99 mg/dL   Comment 1 Notify RN  Glucose, capillary     Status: Abnormal   Collection Time: 07/08/15  5:21 PM  Result Value Ref Range   Glucose-Capillary 147 (H) 65 - 99 mg/dL   Comment 1 Notify RN   Glucose, capillary     Status: Abnormal   Collection Time: 07/08/15  8:59 PM  Result Value Ref Range   Glucose-Capillary 120 (H) 65 - 99 mg/dL  Glucose, capillary     Status: Abnormal   Collection Time: 07/09/15  7:04 AM  Result Value Ref Range   Glucose-Capillary 106 (H) 65 - 99 mg/dL  Glucose, capillary     Status: Abnormal   Collection Time: 07/09/15 12:03 PM  Result Value Ref Range   Glucose-Capillary 127 (H) 65 - 99 mg/dL   Comment 1 Notify RN   Glucose, capillary     Status: Abnormal   Collection Time: 07/09/15  5:04 PM  Result Value Ref Range   Glucose-Capillary 138 (H) 65 - 99 mg/dL   Comment 1 Notify RN   Glucose, capillary     Status: Abnormal   Collection Time: 07/09/15  9:11 PM  Result Value Ref Range   Glucose-Capillary 137 (H) 65 - 99 mg/dL  Occult blood card to lab, stool RN will collect     Status: None   Collection Time: 07/10/15 12:05 AM  Result Value Ref Range   Fecal Occult Bld NEGATIVE NEGATIVE  Glucose, capillary     Status:  Abnormal   Collection Time: 07/10/15  6:29 AM  Result Value Ref Range   Glucose-Capillary 156 (H) 65 - 99 mg/dL     HEENT: normal Cardio: RRR and no murmur Resp: CTA B/L and unlabored GI: BS positive and NT, ND Extremity:  No Edema Skin:   Intact and Other no heel breakdown Neuro: Lethargic, Confused, Flat, Cranial Nerve Abnormalities Left CN central 7, Abnormal Sensory reduced pinch sensation in LUE and LLE, Abnormal Motor 0/5 LUE and LLE, Tone:  clonus in Left ankle, Dysarthric and Inattention Musc/Skel:  Other no pain with LUE or LLE ROM, tenderness over the R trap Gen NAD   Assessment/Plan: 1. Functional deficits secondary to Right MCA infarct with Left HP, Left neglect, Left hemisensory deficits which require 3+ hours per day of interdisciplinary therapy in a comprehensive inpatient rehab setting. Physiatrist is providing close team supervision and 24 hour management of active medical problems listed below. Physiatrist and rehab team continue to assess barriers to discharge/monitor patient progress toward functional and medical goals. FIM: Function - Bathing Position: Bed Body parts bathed by patient: Left arm, Chest, Abdomen, Front perineal area, Right upper leg, Left upper leg Body parts bathed by helper: Right arm, Buttocks, Right lower leg, Left lower leg, Back Bathing not applicable: Front perineal area, Buttocks, Right upper leg, Left upper leg, Right lower leg, Left lower leg (Did not attempt secondary to decreased time) Assist Level: 2 helpers  Function- Upper Body Dressing/Undressing What is the patient wearing?: Pull over shirt/dress Pull over shirt/dress - Perfomed by patient: Put head through opening Pull over shirt/dress - Perfomed by helper: Thread/unthread right sleeve, Thread/unthread left sleeve, Pull shirt over trunk Function - Lower Body Dressing/Undressing What is the patient wearing?: Pants, Shoes Position: Wheelchair/chair at sink Pants- Performed by  helper: Thread/unthread right pants leg, Thread/unthread left pants leg, Pull pants up/down Non-skid slipper socks- Performed by helper: Don/doff right sock, Don/doff left sock Shoes - Performed by patient: Don/doff right shoe Shoes - Performed by helper: Don/doff left shoe Assist for lower body  dressing: 2 Helpers  Function - Psychologist, occupational activity did not occur: No continent bowel/bladder event  Function - Air cabin crew transfer activity did not occur: Safety/medical concerns  Function - Chair/bed transfer Chair/bed transfer method: Squat pivot Chair/bed transfer assist level: 2 helpers Chair/bed transfer assistive device: Mechanical lift Mechanical lift: Maximove Chair/bed transfer details: Verbal cues for technique, Manual facilitation for weight shifting, Manual facilitation for placement  Function - Locomotion: Wheelchair Will patient use wheelchair at discharge?: Yes Type: Manual (TIS) Max wheelchair distance: 150 Assist Level: Dependent (Pt equals 0%) Assist Level: Dependent (Pt equals 0%) Assist Level: Dependent (Pt equals 0%) Function - Locomotion: Ambulation Ambulation activity did not occur: Safety/medical concerns  Function - Comprehension Comprehension: Auditory Comprehension assist level: Understands basic 75 - 89% of the time/ requires cueing 10 - 24% of the time  Function - Expression Expression: Verbal Expression assist level: Expresses basic 75 - 89% of the time/requires cueing 10 - 24% of the time. Needs helper to occlude trach/needs to repeat words.  Function - Social Interaction Social Interaction assist level: Interacts appropriately 25 - 49% of time - Needs frequent redirection.  Function - Problem Solving Problem solving assist level: Solves basic 25 - 49% of the time - needs direction more than half the time to initiate, plan or complete simple activities  Function - Memory Memory assist level: Recognizes or recalls 25 - 49% of the  time/requires cueing 50 - 75% of the time Patient normally able to recall (first 3 days only): Current season, Staff names and faces, That he or she is in a hospital  Medical Problem List and Plan: 1.  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 No antithrombotic needed.  2.  DVT Prophylaxis/Anticoagulation: Pharmaceutical: Lovenox 3. Pain Management: Has chronic back pain per family. Will use tylenol prn for now.   4. Mood: LCSW to follow for evaluation and support.   5. Neuropsych: This patient is not fully capable of making decisions on her own behalf. 6. Skin/Wound Care: routine pressure relief measures.   7. Fluids/Electrolytes/Nutrition: Monitor I/O. Only taking 20-30% meals, Creat  , BUN 6 Getting dehydrated with honey liquids. Will need IVF at nights and need to encourage fluid intake during the day. Check lytes in am.   8. 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 PET scan on outpatient basis. 10. Hypokalemia: K+ 3.4 2/14 will recheck in am increase KCL supplement 11. Cytotoxic edema: Weaned of hypertonic solution.    12. Maculopapular rash:  Likely due to antibiotics per ID and itching being managed with prn benadryl. Antibiotics narrowed to Ceftriaxone on 02/11--monitor for now. IF rash recurs will need to change back to imipenem.   Antibiotic D # 24--End date March 7th.   13. Anemia: Hgb low but has stayed in 7.6-8.0 range, check stool guaic was pos 1/23, neg on 2/2 and 2/16 14. Hypotension: Cont to monitor 15. Headache: Will start topamax, this likely in part due to cerebral edema which will slowly subside   LOS (Days) 3 A FACE TO FACE EVALUATION WAS PERFORMED  Becky Patterson E 07/10/2015, 7:31 AM

## 2015-07-10 NOTE — Progress Notes (Signed)
Physical Therapy Note  Patient Details  Name: Becky Patterson MRN: [id number] Date of Birth: [birth year] Today's Date: 07/10/2015    Time: 1045-1128 43 minutes  1:1 No c/o pain.  W/c <> mat transfers with +2 total A. Pt with impaired motor planning and coordination, resisting assistance for pivoting during transfers.  Seated balance with max/total A initially with reaching and sorting task, able to progress to min A with reaching to Rt, total/max A for midline posture. Pt able to sort bean bags into appropriate categories of lunch, dinner, breakfast foods with supervision cuing.  Sit to stand multiple attempts with +2 assist, max A on Lt, min A on Rt. Pt able to shift wt with max manual facilitation, able to tap Rt LE toes with cuing and demonstration.  Pt continues to require cues for attention to task and cues for Lt attention.   [name] 07/10/2015, 11:29 AM

## 2015-07-10 NOTE — Progress Notes (Signed)
Occupational Therapy Session Note  Patient Details  Name: Becky Patterson MRN: [id number] Date of Birth: Nov 21, 1955  Today's Date: 07/10/2015 OT Individual Time: 0300-9233 OT Individual Time Calculation (min): 46 min    Short Term Goals: Week 1:  OT Short Term Goal 1 (Week 1): Pt will maintain static sitting balance EOB for 2 mins with close supervision in preparation for selfcare tasks.  OT Short Term Goal 2 (Week 1): Pt will complete UB bathing and dressing sitting supported with min assist. OT Short Term Goal 3 (Week 1): Pt will transfer squat pivot to the drop arm commode with max assist of 1 person.  OT Short Term Goal 4 (Week 1): Pt will locate all grooming and bathing items placed left of midline with no more than mod instructional cueing. OT Short Term Goal 5 (Week 1): Pt's spouse will return demonstrate safe assist with LUE PROM exercises following handout.    Skilled Therapeutic Interventions/Progress Updates:    Pt completed bathing and dressing sit to stand EOB.  Total assist for sitting balance secondary to pt pushing herself over to the left side in sitting with the RUE.  Max demonstrational cueing for head turns to the left side for locating bathing items.  Pt tends to keep her head and eyes focused to the right side.  Max questioning cueing to sequence through bathing with pt demonstrating decreased selective attention.  At times she would look over to her husband and ask him to help her, even though therapist and tech were sitting right beside of her.  Max hand over hand assistance with LUE use with pt not wanting to visually look at the extremity when attempting to place the washcloth in the hand.  Instead she would just drop the washcloth on the floor with the RUE, thinking she had placed it in the left hand.  Max demonstrational cueing and max assist to donn clothing over LB with mod assist for UB.  Pt not recalling hemi techniques that she was educated on the day before.  Sit to  stand with total assist while second person assisted with pulling up pants over hips.  Utilized sliding board transfer for bed to wheelchair with total +2 pt 30% to the right side.  Pt left in tilt in space chair with safety belt in place.    Therapy Documentation Precautions:  Precautions Precautions: Fall Precaution Comments: Lt neglect , poor trunk control, decreased awareness Restrictions Weight Bearing Restrictions: No  Pain: Pain Assessment Pain Assessment: No/denies pain ADL: See Function Navigator for Current Functional Status.   Therapy/Group: Individual Therapy  Kelson Queenan OTR/L 07/10/2015, 12:38 PM

## 2015-07-10 NOTE — Progress Notes (Signed)
Occupational Therapy Session Note  Patient Details  Name: Becky Patterson MRN: [id number] Date of Birth: [birth year]  Today's Date: 07/10/2015 OT Individual Time: 1302-1405 OT Individual Time Calculation (min): 63 min    Short Term Goals: Week 1:  OT Short Term Goal 1 (Week 1): Pt will maintain static sitting balance EOB for 2 mins with close supervision in preparation for selfcare tasks.  OT Short Term Goal 2 (Week 1): Pt will complete UB bathing and dressing sitting supported with min assist. OT Short Term Goal 3 (Week 1): Pt will transfer squat pivot to the drop arm commode with max assist of 1 person.  OT Short Term Goal 4 (Week 1): Pt will locate all grooming and bathing items placed left of midline with no more than mod instructional cueing. OT Short Term Goal 5 (Week 1): Pt's spouse will return demonstrate safe assist with LUE PROM exercises following handout.    Skilled Therapeutic Interventions/Progress Updates:    Pt used bed pan in supine as she was just being placed on it by nursing as therapist entered.  At this time she is not safe to use the [facility] unless 2 people are with her.  She had successful attempt to urinate on bed pan but needed max assist for cleaning peri area secondary to having some stool present.  Total assist for rolling to the right with mod assist to the left to remove bedpan, donn new brief, and pull pants over hips.  Utilized Maximove for transfer to the tilt in space chair from the bed.  Educated husband on PROM exercises for the LUE while in chair.  He was able to return demonstrate with min assist for correct technique.  Emphasized the need for her to maintain visual attention on the UE while performing PROM exercises.  Handout also provided.  Transitioned session to use of the [employer] for further work on Health and safety inspector.  Pt with significant visual attention and left visual field deficit.  Testing only utilized the left and right lower quadrants secondary to  pt being in wheelchair.  She averaged finding items in the left quadrant in 4.5 seconds and then on the left in 5.5 seconds for the first trial.  Second trial averaged 13 seconds in the left visual field and 4 seconds on the right.  Returned to room at end of session with therapist transferring pt back to the bed with use of maximove.  Pt left with call button in reach and bed alarm in place.   Therapy Documentation Precautions:  Precautions Precautions: Fall Precaution Comments: Lt neglect , poor trunk control, decreased awareness Restrictions Weight Bearing Restrictions: No  Pain: Pain Assessment Pain Assessment: No/denies pain ADL: See Function Navigator for Current Functional Status.   Therapy/Group: Individual Therapy  Amirr Achord OTR/L 07/10/2015, 3:47 PM

## 2015-07-11 ENCOUNTER — Inpatient Hospital Stay (HOSPITAL_COMMUNITY): Payer: 59 | Admitting: Physical Therapy

## 2015-07-11 ENCOUNTER — Inpatient Hospital Stay (HOSPITAL_COMMUNITY): Payer: 59 | Admitting: Occupational Therapy

## 2015-07-11 ENCOUNTER — Inpatient Hospital Stay (HOSPITAL_COMMUNITY): Payer: 59 | Admitting: Speech Pathology

## 2015-07-11 LAB — GLUCOSE, CAPILLARY
GLUCOSE-CAPILLARY: 141 mg/dL — AB (ref 65–99)
GLUCOSE-CAPILLARY: 129 mg/dL — AB (ref 65–99)
GLUCOSE-CAPILLARY: 147 mg/dL — AB (ref 65–99)
Glucose-Capillary: 148 mg/dL — ABNORMAL HIGH (ref 65–99)

## 2015-07-11 MED ORDER — TRAMADOL HCL 50 MG PO TABS
100.0000 mg | ORAL_TABLET | Freq: Four times a day (QID) | ORAL | Status: DC | PRN
  Administered 2015-07-11 – 2015-07-30 (×34): 100 mg via ORAL
  Filled 2015-07-11 (×37): qty 2

## 2015-07-11 MED ORDER — CHLORHEXIDINE GLUCONATE 0.12 % MT SOLN
OROMUCOSAL | Status: AC
  Filled 2015-07-11: qty 15

## 2015-07-11 MED ORDER — ALTEPLASE 2 MG IJ SOLR
2.0000 mg | Freq: Once | INTRAMUSCULAR | Status: AC
  Administered 2015-07-11: 2 mg
  Filled 2015-07-11: qty 2

## 2015-07-11 NOTE — Progress Notes (Signed)
Subjective/Complaints: Her sister had a restless evening. RN also concurs. Patient had several complaints, abdominal itching and back itching as well as headache. ROS- No SOB/CP No abd pain, - N/V, no blood in stool, stools dark  Objective: Vital Signs: Blood pressure 110/67, pulse 93, temperature 98.5 F (36.9 C), temperature source Oral, resp. rate 18, height '5\' 1"'  (1.549 m), weight 83.961 kg (185 lb 1.6 oz), SpO2 96 %. No results found. Results for orders placed or performed during the hospital encounter of 07/07/15 (from the past 72 hour(s))  Glucose, capillary     Status: Abnormal   Collection Time: 07/08/15 11:53 AM  Result Value Ref Range   Glucose-Capillary 129 (H) 65 - 99 mg/dL   Comment 1 Notify RN   Glucose, capillary     Status: Abnormal   Collection Time: 07/08/15  5:21 PM  Result Value Ref Range   Glucose-Capillary 147 (H) 65 - 99 mg/dL   Comment 1 Notify RN   Glucose, capillary     Status: Abnormal   Collection Time: 07/08/15  8:59 PM  Result Value Ref Range   Glucose-Capillary 120 (H) 65 - 99 mg/dL  Glucose, capillary     Status: Abnormal   Collection Time: 07/09/15  7:04 AM  Result Value Ref Range   Glucose-Capillary 106 (H) 65 - 99 mg/dL  Glucose, capillary     Status: Abnormal   Collection Time: 07/09/15 12:03 PM  Result Value Ref Range   Glucose-Capillary 127 (H) 65 - 99 mg/dL   Comment 1 Notify RN   Glucose, capillary     Status: Abnormal   Collection Time: 07/09/15  5:04 PM  Result Value Ref Range   Glucose-Capillary 138 (H) 65 - 99 mg/dL   Comment 1 Notify RN   Glucose, capillary     Status: Abnormal   Collection Time: 07/09/15  9:11 PM  Result Value Ref Range   Glucose-Capillary 137 (H) 65 - 99 mg/dL  Occult blood card to lab, stool RN will collect     Status: None   Collection Time: 07/10/15 12:05 AM  Result Value Ref Range   Fecal Occult Bld NEGATIVE NEGATIVE  Glucose, capillary     Status: Abnormal   Collection Time: 07/10/15  6:29 AM   Result Value Ref Range   Glucose-Capillary 156 (H) 65 - 99 mg/dL  Basic metabolic panel     Status: Abnormal   Collection Time: 07/10/15  8:30 AM  Result Value Ref Range   Sodium 137 135 - 145 mmol/L   Potassium 4.0 3.5 - 5.1 mmol/L   Chloride 100 (L) 101 - 111 mmol/L   CO2 25 22 - 32 mmol/L   Glucose, Bld 186 (H) 65 - 99 mg/dL   BUN 6 6 - 20 mg/dL   Creatinine, Ser 0.80 0.44 - 1.00 mg/dL   Calcium 9.0 8.9 - 10.3 mg/dL   GFR calc non Af Amer >60 >60 mL/min   GFR calc Af Amer >60 >60 mL/min    Comment: (NOTE) The eGFR has been calculated using the CKD EPI equation. This calculation has not been validated in all clinical situations. eGFR's persistently <60 mL/min signify possible Chronic Kidney Disease.    Anion gap 12 5 - 15  Glucose, capillary     Status: Abnormal   Collection Time: 07/10/15 11:29 AM  Result Value Ref Range   Glucose-Capillary 109 (H) 65 - 99 mg/dL   Comment 1 Notify RN   Glucose, capillary  Status: Abnormal   Collection Time: 07/10/15  4:51 PM  Result Value Ref Range   Glucose-Capillary 103 (H) 65 - 99 mg/dL   Comment 1 Notify RN   Glucose, capillary     Status: Abnormal   Collection Time: 07/10/15  8:47 PM  Result Value Ref Range   Glucose-Capillary 162 (H) 65 - 99 mg/dL  Glucose, capillary     Status: Abnormal   Collection Time: 07/11/15  6:54 AM  Result Value Ref Range   Glucose-Capillary 148 (H) 65 - 99 mg/dL     HEENT: normal Cardio: RRR and no murmur Resp: CTA B/L and unlabored GI: BS positive and NT, ND Extremity:  No Edema Skin:   Intact and Other no heel breakdown, no evidence of rash Neuro: Lethargic, Confused, Flat, Cranial Nerve Abnormalities Left CN central 7, Abnormal Sensory reduced pinch sensation in LUE and LLE, Abnormal Motor 0/5 LUE and LLE, Tone:  clonus in Left ankle, Dysarthric and Inattention, left field cut Musc/Skel:  Other no pain with LUE or LLE ROM, tenderness over the R trap Gen NAD   Assessment/Plan: 1.  Functional deficits secondary to Right MCA infarct with Left HP, Left neglect, Left hemisensory deficits which require 3+ hours per day of interdisciplinary therapy in a comprehensive inpatient rehab setting. Physiatrist is providing close team supervision and 24 hour management of active medical problems listed below. Physiatrist and rehab team continue to assess barriers to discharge/monitor patient progress toward functional and medical goals. FIM: Function - Bathing Position: Bed Body parts bathed by patient: Left arm, Chest, Abdomen, Front perineal area, Right upper leg, Left upper leg Body parts bathed by helper: Right arm, Buttocks, Right lower leg, Left lower leg, Back Bathing not applicable: Front perineal area, Buttocks, Right upper leg, Left upper leg, Right lower leg, Left lower leg (Did not attempt secondary to decreased time) Assist Level: 2 helpers  Function- Upper Body Dressing/Undressing What is the patient wearing?: Pull over shirt/dress Pull over shirt/dress - Perfomed by patient: Put head through opening, Thread/unthread right sleeve Pull over shirt/dress - Perfomed by helper: Thread/unthread left sleeve, Pull shirt over trunk Function - Lower Body Dressing/Undressing What is the patient wearing?: Pants, Shoes Position: Sitting EOB Pants- Performed by helper: Thread/unthread right pants leg, Thread/unthread left pants leg, Pull pants up/down Non-skid slipper socks- Performed by helper: Don/doff right sock, Don/doff left sock Shoes - Performed by patient: Don/doff right shoe Shoes - Performed by helper: Don/doff left shoe Assist for lower body dressing: 2 Helpers  Function - Toileting Toileting activity did not occur: No continent bowel/bladder event  Function - Air cabin crew transfer activity did not occur: Safety/medical concerns  Function - Chair/bed transfer Chair/bed transfer method: Squat pivot Chair/bed transfer assist level: 2 helpers Chair/bed  transfer assistive device: Mechanical lift Mechanical lift: Maximove Chair/bed transfer details: Verbal cues for technique, Manual facilitation for weight shifting, Manual facilitation for placement  Function - Locomotion: Wheelchair Will patient use wheelchair at discharge?: Yes Type: Manual (TIS) Max wheelchair distance: 150 Assist Level: Dependent (Pt equals 0%) Assist Level: Dependent (Pt equals 0%) Assist Level: Dependent (Pt equals 0%) Function - Locomotion: Ambulation Ambulation activity did not occur: Safety/medical concerns  Function - Comprehension Comprehension: Auditory Comprehension assist level: Understands basic 75 - 89% of the time/ requires cueing 10 - 24% of the time  Function - Expression Expression: Verbal Expression assist level: Expresses basic 75 - 89% of the time/requires cueing 10 - 24% of the time. Needs helper to occlude trach/needs  to repeat words.  Function - Social Interaction Social Interaction assist level: Interacts appropriately 25 - 49% of time - Needs frequent redirection.  Function - Problem Solving Problem solving assist level: Solves basic 50 - 74% of the time/requires cueing 25 - 49% of the time  Function - Memory Memory assist level: Recognizes or recalls 25 - 49% of the time/requires cueing 50 - 75% of the time Patient normally able to recall (first 3 days only): Current season, Staff names and faces, That he or she is in a hospital  Medical Problem List and Plan: 1.  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 No antithrombotic needed.  2.  DVT Prophylaxis/Anticoagulation: Pharmaceutical: Lovenox 3. Pain Management: Has chronic back pain per family. Will use tylenol prn for now.  poststroke headache initiating Topamax, does have some cerebral edema on her last CT scan, no signs of neurologic decline. We'll continue with analgesics, change Percocet to tramadol as it may be  causing itching 4. Mood: LCSW to follow for evaluation and support.   5. Neuropsych: This patient is not fully capable of making decisions on her own behalf. 6. Skin/Wound Care: routine pressure relief measures.   7. Fluids/Electrolytes/Nutrition: Monitor I/O. Only taking 20-30% meals, Creat  , BUN 6 Getting dehydrated with honey liquids. Will need IVF at nights and need to encourage fluid intake during the day. Check lytes in am.   8. 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 PET scan on outpatient basis. 10. Hypokalemia: K+ 3.4 2/14 will recheck in am increase KCL supplement, repeat potassium 4.0 on 2/16 11. Cytotoxic edema: Weaned of hypertonic solution.    12. History of Maculopapular rash:  Likely due to antibiotics per ID and itching being managed with prn benadryl. Antibiotics narrowed to Ceftriaxone on 02/11--monitor for now. IF rash recurs will need to change back to imipenem. patient with itching but not rash, localized abdomen and back  Antibiotic D # 24--End date March 7th.   13. Anemia: Hgb low but has stayed in 7.6-8.0 range, check stool guaic was pos 1/23, neg on 2/2 and 2/16 14. Hypotension: Cont to monitor 15. Headache: Will start topamax, this likely in part due to cerebral edema which will slowly subside, analgesic changes as above   LOS (Days) 4 A FACE TO FACE EVALUATION WAS PERFORMED  Villa Burgin E 07/11/2015, 8:42 AM

## 2015-07-11 NOTE — Progress Notes (Signed)
Speech Language Pathology Daily Session Note  Patient Details  Name: Becky Patterson MRN: [id number] Date of Birth: [birth year]  Today's Date: 07/11/2015 SLP Individual Time: 1405-1500 SLP Individual Time Calculation (min): 55 min  Short Term Goals: Week 1: SLP Short Term Goal 1 (Week 1): Pt will attend to the left in >50% of observable opportunities with max assist verbal cues.   SLP Short Term Goal 2 (Week 1): Pt will sustain her attention to basic familiar tasks for 3-5 minute intervals with mod assist verbal cues for redirection.  SLP Short Term Goal 3 (Week 1): Pt will consume her currently prescribed diet with min verbal cues for use of swallowing precautions.   SLP Short Term Goal 4 (Week 1): Pt will consume therapeutic trials of ice chips with minimal overt s/s of aspiration to determine readiness for repeat objective study.  SLP Short Term Goal 5 (Week 1): Pt will identify at least 2 deficits occurring s/p CVA with mod question cues.    Skilled Therapeutic Interventions:  Pt was seen for skilled ST targeting goals for cognition and dysphagia.  SLP facilitated the session with therapeutic trials of ice chips to continue working towards liquids advancement.  Pt demonstrated immediate cough in 1 out of 8 trials of ice chips.  No overt s/s of aspiration were evident with thin liquids via cup sips although pt with perceptibly delayed cough both audibly and per palpation.  Recommend ongoing trials of ice chips and thin liquids with SLP only to determine readiness for repeat objective study.  SLP also facilitated the session with a calendar task targeting sustained attention to task and visual scanning to the left.  Pt required mod assist verbal cues to locate cards from varying positions in the left visual field and place them into a large print wall calendar.  Pt was able to sustain her attention to task for ~5 minute intervals with min-mod assist verbal cues for redirection.  Pt was returned to  room and left with nursing to assist in transfer back to bed.  Continue per current plan of care.    Function:  Eating Eating   Modified Consistency Diet: Yes Eating Assist Level: Helper checks for pocketed food;Set up assist for;Supervision or verbal cues;Help with picking up utensils           Cognition Comprehension Comprehension assist level: Understands basic 75 - 89% of the time/ requires cueing 10 - 24% of the time  Expression   Expression assist level: Expresses basic 75 - 89% of the time/requires cueing 10 - 24% of the time. Needs helper to occlude trach/needs to repeat words.  Social Interaction Social Interaction assist level: Interacts appropriately 25 - 49% of time - Needs frequent redirection.  Problem Solving Problem solving assist level: Solves basic 50 - 74% of the time/requires cueing 25 - 49% of the time  Memory Memory assist level: Recognizes or recalls 25 - 49% of the time/requires cueing 50 - 75% of the time    Pain Pain Assessment Pain Assessment: No/denies pain   Therapy/Group: Individual Therapy  Toya Palacios, Selinda Orion 07/11/2015, 4:50 PM

## 2015-07-11 NOTE — Progress Notes (Signed)
Physical Therapy Session Note  Patient Details  Name: Becky Patterson MRN: [id number] Date of Birth: 1955-09-24  Today's Date: 07/11/2015 PT Individual Time: 1104-1204 PT Individual Time Calculation (min): 60 min   Short Term Goals: Week 1:  PT Short Term Goal 1 (Week 1): pt will maintain static sitting balance with min A PT Short Term Goal 2 (Week 1): Pt will perform functional transfers with max A PT Short Term Goal 3 (Week 1): pt will attend to therapeutic task x 1 minute with min cuing  Skilled Therapeutic Interventions/Progress Updates:   Pt received in w/c with head turned completely to the R; pt still presents with very flat affect and max encouragement required to engage patient in conversation and task.  Pt transported to the gym and performed scooting forwards in the chair with max A for lateral leans and to advance L hips forwards and verbal cues to initiate sequence.  Performed squat pivots w/c <> mat with total A +2.  Seated on mat performed NMR; see below for details.  Returned to w/c squat pivot and positioned in w/c with head propped up to midline and RLE positioned to minimize ER.  Husband advised to sit, stand and interact with pt on L side to facilitate increased head turns, visual scanning and attention to L.  Pt left in reclined position for lunch.  Therapy Documentation Precautions:  Precautions Precautions: Fall Precaution Comments: Lt neglect , poor trunk control, decreased awareness Restrictions Weight Bearing Restrictions: No Pain: Pain Assessment Pain Assessment: No/denies pain Other Treatments: Treatments Neuromuscular Facilitation: Left;Lower Extremity;Upper Extremity;Forced use;Activity to increase timing and sequencing;Activity to increase grading;Activity to increase motor control;Activity to increase sustained activation;Activity to increase lateral weight shifting;Activity to increase anterior-posterior weight shifting seated on mat with L hip wedged to  increase trunk shortening and weight shifting to R; performed cognitive matching game with pt reaching forwards, R and across midline to match cards to facilitate attention and visual scanning to L, attention to task, head and postural control and sitting balance.  Also performed reaching up, forwards and to the R out of BOS for cones to facilitate increased L trunk shortening, R trunk elongation and weight shifting to R through LLE extension.  Performed sit > stand from mat with +2 total A and use of mirror in front of patient as visual feedback for head and trunk positioning; performed lateral weight shifting to L and L stance training while lifting R heel with total A at pelvis, and trunk to maintain upright trunk, L pelvis anterior rotation and head position.  Pt tolerated well but fatigued at end.    See Function Navigator for Current Functional Status.   Therapy/Group: Individual Therapy  Raylene [facility] 07/11/2015, 12:18 PM

## 2015-07-11 NOTE — Discharge Instructions (Signed)
Inpatient Rehab Discharge Instructions  Becky Patterson Discharge date and time:     Activities/Precautions/ Functional Status: Activity: no lifting, driving, or strenuous exercise till cleared by MD. Diet: cardiac diet Wound Care: N/A   Functional status:  ___ No restrictions     ___ Walk up steps independently ___ 24/7 supervision/assistance   ___ Walk up steps with assistance ___ Intermittent supervision/assistance  ___ Bathe/dress independently ___ Walk with walker     ___ Bathe/dress with assistance ___ Walk Independently    ___ Shower independently ___ Walk with assistance    ___ Shower with assistance ___ No alcohol     ___ Return to work/school ________   Special Instructions:   STROKE/TIA DISCHARGE INSTRUCTIONS SMOKING Cigarette smoking nearly doubles your risk of having a stroke & is the single most alterable risk factor  If you smoke or have smoked in the last 12 months, you are advised to quit smoking for your health.  Most of the excess cardiovascular risk related to smoking disappears within a year of stopping.  Ask you doctor about anti-smoking medications  [place] Quit Line: 1-800-QUIT NOW  Free Smoking Cessation Classes [phone]  CHOLESTEROL Know your levels; limit fat & cholesterol in your diet  Lipid Panel     Component Value Date/Time   CHOL 64 06/30/2015 0430   TRIG 109 06/30/2015 0430   HDL 16* 06/30/2015 0430   CHOLHDL 4.0 06/30/2015 0430   VLDL 22 06/30/2015 0430   LDLCALC 26 06/30/2015 0430      Many patients benefit from treatment even if their cholesterol is at goal.  Goal: Total Cholesterol (CHOL) less than 160  Goal:  Triglycerides (TRIG) less than 150  Goal:  HDL greater than 40  Goal:  LDL (LDLCALC) less than 100   BLOOD PRESSURE American Stroke Association blood pressure target is less that 120/80 mm/Hg  Your discharge blood pressure is:  BP: 110/67 mmHg  Monitor your blood pressure  Limit your salt and alcohol  intake  Many individuals will require more than one medication for high blood pressure  DIABETES (A1c is a blood sugar average for last 3 months) Goal HGBA1c is under 7% (HBGA1c is blood sugar average for last 3 months)  Diabetes:     Lab Results  Component Value Date   HGBA1C 6.3* 06/30/2015     Your HGBA1c can be lowered with medications, healthy diet, and exercise.  Check your blood sugar as directed by your physician  Call your physician if you experience unexplained or low blood sugars.  PHYSICAL ACTIVITY/REHABILITATION Goal is 30 minutes at least 4 days per week  Activity: No driving, Therapies: See above Return to work:  N/A  Activity decreases your risk of heart attack and stroke and makes your heart stronger.  It helps control your weight and blood pressure; helps you relax and can improve your mood.  Participate in a regular exercise program.  Talk with your doctor about the best form of exercise for you (dancing, walking, swimming, cycling).  DIET/WEIGHT Goal is to maintain a healthy weight  Your discharge diet is: DIET DYS 3 Room service appropriate?: Yes; Fluid consistency:: Honey Thick  liquids Your height is:  Height: '5\' 1"'$  (154.9 cm) Your current weight is: Weight: 83.961 kg (185 lb 1.6 oz) Your Body Mass Index (BMI) is:     Following the type of diet specifically designed for you will help prevent another stroke.  Your goal weight is:   Your goal Body Mass  Index (BMI) is 19-24.  Healthy food habits can help reduce 3 risk factors for stroke:  High cholesterol, hypertension, and excess weight.  RESOURCES Stroke/Support Group:  Call [phone]   STROKE EDUCATION PROVIDED/REVIEWED AND GIVEN TO PATIENT Stroke warning signs and symptoms How to activate emergency medical system (call 911). Medications prescribed at discharge. Need for follow-up after discharge. Personal risk factors for stroke. Pneumonia vaccine given:  Flu vaccine given:  My questions have  been answered, the writing is legible, and I understand these instructions.  I will adhere to these goals & educational materials that have been provided to me after my discharge from the hospital.      My questions have been answered and I understand these instructions. I will adhere to these goals and the provided educational materials after my discharge from the hospital.  Patient/Caregiver Signature _______________________________ Date __________  Clinician Signature _______________________________________ Date __________  Please bring this form and your medication list with you to all your follow-up doctor's appointments.

## 2015-07-11 NOTE — Progress Notes (Signed)
Occupational Therapy Session Note  Patient Details  Name: Becky Patterson MRN: [id number] Date of Birth: [birth year]  Today's Date: 07/11/2015 OT Individual Time: 7793-9030 OT Individual Time Calculation (min): 75 min    Short Term Goals: Week 1:  OT Short Term Goal 1 (Week 1): Pt will maintain static sitting balance EOB for 2 mins with close supervision in preparation for selfcare tasks.  OT Short Term Goal 2 (Week 1): Pt will complete UB bathing and dressing sitting supported with min assist. OT Short Term Goal 3 (Week 1): Pt will transfer squat pivot to the drop arm commode with max assist of 1 person.  OT Short Term Goal 4 (Week 1): Pt will locate all grooming and bathing items placed left of midline with no more than mod instructional cueing. OT Short Term Goal 5 (Week 1): Pt's spouse will return demonstrate safe assist with LUE PROM exercises following handout.    Skilled Therapeutic Interventions/Progress Updates:    Pt worked on LB bathing and dressing sitting EOB.  She had already donned a pullover shirt prior to morning session so did not attempt any UB selfcare.  Total assist for sidelying to sit on the left side, with pt needing total assist to maintain sitting balance EOB.  Increased pushing to the left side with the RUE when allowed to place it down for stabilization.  Attempted to have pt relax RUE when not in use and gave her a target on the right side to move her shoulder to in order to decreased pushing.  Max instructional cueing for head turns and scanning to the left side for locating wash basin and washcloth.  Pt with noted initiation of LEs to assist with sit to stand.  Max assist for scooting up to EOB with total assist +2-3 for standing to pull brief and pants over hips.  Pt still with head turn to the right and decreased hip, knee, and trunk extension on the left side in standing.  Completed stand pivot transfer to the tilt in space chair with total +2 (pt 20%).  Unable to  advance the LLE during transfer and decreased ability to follow and understand verbal direction for advancing the right as well.  When pt was told to step forward with the RLE she stepped to the side.  After reaching the chair pt was positioned at the sink for grooming tasks.  Min assist with mod instructional cueing to complete brushing her hair thoroughly and for oral hygiene.  Pt alerted therapist and rehab tech that she needed to go to the bathroom.  Therapist and tech transferred pt to the drop arm commode with total assist +2 to complete toileting.  She was able to void and have BM but needed max supervision to keep from falling off of the toilet secondary to pushing tendencies.  Total assist +2 for clothing and hygiene as well as transfer back to the wheelchair.  Pt left in semi reclined position with safety belt in place and call button within reach.  Husband also present.  Discussed progress and her pushing tendencies in addition to the left hemiparesis.    Therapy Documentation Precautions:  Precautions Precautions: Fall Precaution Comments: Lt neglect , poor trunk control, decreased awareness Restrictions Weight Bearing Restrictions: No  Pain: Pain Assessment Pain Assessment: No/denies pain ADL: See Function Navigator for Current Functional Status.   Therapy/Group: Individual Therapy  Cadan Maggart OTR/L 07/11/2015, 11:58 AM

## 2015-07-12 ENCOUNTER — Inpatient Hospital Stay (HOSPITAL_COMMUNITY): Payer: 59 | Admitting: Speech Pathology

## 2015-07-12 ENCOUNTER — Inpatient Hospital Stay (HOSPITAL_COMMUNITY): Payer: 59 | Admitting: *Deleted

## 2015-07-12 ENCOUNTER — Inpatient Hospital Stay (HOSPITAL_COMMUNITY): Payer: 59 | Admitting: Occupational Therapy

## 2015-07-12 LAB — GLUCOSE, CAPILLARY
GLUCOSE-CAPILLARY: 112 mg/dL — AB (ref 65–99)
GLUCOSE-CAPILLARY: 109 mg/dL — AB (ref 65–99)
Glucose-Capillary: 112 mg/dL — ABNORMAL HIGH (ref 65–99)
Glucose-Capillary: 179 mg/dL — ABNORMAL HIGH (ref 65–99)

## 2015-07-12 LAB — OCCULT BLOOD X 1 CARD TO LAB, STOOL: FECAL OCCULT BLD: NEGATIVE

## 2015-07-12 MED ORDER — CHLORHEXIDINE GLUCONATE 0.12 % MT SOLN
OROMUCOSAL | Status: AC
  Filled 2015-07-12: qty 15

## 2015-07-12 NOTE — Progress Notes (Signed)
Occupational Therapy Session Note  Patient Details  Name: Becky Patterson MRN: [id number] Date of Birth: [birth year]  Today's Date: 07/12/2015 OT Individual Time: 0800-0900 OT Individual Time Calculation (min): 60 min    Short Term Goals: Week 1:  OT Short Term Goal 1 (Week 1): Pt will maintain static sitting balance EOB for 2 mins with close supervision in preparation for selfcare tasks.  OT Short Term Goal 2 (Week 1): Pt will complete UB bathing and dressing sitting supported with min assist. OT Short Term Goal 3 (Week 1): Pt will transfer squat pivot to the drop arm commode with max assist of 1 person.  OT Short Term Goal 4 (Week 1): Pt will locate all grooming and bathing items placed left of midline with no more than mod instructional cueing. OT Short Term Goal 5 (Week 1): Pt's spouse will return demonstrate safe assist with LUE PROM exercises following handout.      Skilled Therapeutic Interventions/Progress Updates:    Pt seen for skilled OT to facilitate attention to L side of environment, L side of body, trunk control and attention to task with BADL training. Pt received in bed requesting to toilet. Bed pan provided as pt not safe to sit on BSC at this time. Pt did well rolling to L with rails, but needed max cues to support L arm as she rolled to R with max cues to turn head in direction she is rolling.  Pt able to wash R leg by lifting it towards her and L leg when lifted by therapist. Pt continually closing eyes through out session.  Rehab tech arrived 2nd half of session to assist with sidelying to sit and transfer into chair. Pt was total A with all mobility as she leaned heavily to the left and was not able to support her balance.  Pt adjusted in chair and set up for breakfast. Pt with quick release belt on with all needs met. Spouse in room with pt.     Therapy Documentation Precautions:  Precautions Precautions: Fall Precaution Comments: Lt neglect , poor trunk control,  decreased awareness Restrictions Weight Bearing Restrictions: No   Pain: Pain Assessment Pain Assessment: 0-10 Pain Score: 6  Pain Type: Acute pain Pain Location: Head ADL:  See Function Navigator for Current Functional Status.   Therapy/Group: Individual Therapy  Aleesha Ringstad 07/12/2015, 12:35 PM

## 2015-07-12 NOTE — Progress Notes (Signed)
Orthopedic Tech Progress Note Patient Details:  Becky Patterson [birth year] [id number] Brace order completed by hanger vendor. Patient ID: Becky Patterson, female   DOB: Jan 13, 1956, 60 y.o.   MRN: [id number]   Braulio Bosch 07/12/2015, 4:58 PM

## 2015-07-12 NOTE — Progress Notes (Signed)
Speech Language Pathology Daily Session Note  Patient Details  Name: Becky Patterson MRN: [id number] Date of Birth: [birth year]  Today's Date: 07/12/2015 SLP Individual Time: 8413-2440 SLP Individual Time Calculation (min): 55 min  Short Term Goals: Week 1: SLP Short Term Goal 1 (Week 1): Pt will attend to the left in >50% of observable opportunities with max assist verbal cues.   SLP Short Term Goal 2 (Week 1): Pt will sustain her attention to basic familiar tasks for 3-5 minute intervals with mod assist verbal cues for redirection.  SLP Short Term Goal 3 (Week 1): Pt will consume her currently prescribed diet with min verbal cues for use of swallowing precautions.   SLP Short Term Goal 4 (Week 1): Pt will consume therapeutic trials of ice chips with minimal overt s/s of aspiration to determine readiness for repeat objective study.  SLP Short Term Goal 5 (Week 1): Pt will identify at least 2 deficits occurring s/p CVA with mod question cues.    Skilled Therapeutic Interventions:  Pt was seen for skilled ST targeting goals for dysphagia and cognition.  Pt very lethargic upon arrival.  Per pt's husband, pt was premedicated with pain medication prior to SLP's arrival due to headache.  No complaints of headache during today's treatment session.  SLP facilitated the session with trials of ice chips and thin liquids via cup sips to continue working towards liquids advancement.  Pt demonstrated delayed congested cough follow 10 tsps of ice chips; and x1 delayed throat clear out of 4 cup sips of thin liquids.  Pt also reports a "smoker's cough" at baseline which could also be contributing to s/s at bedside.  SLP facilitated the session with a basic sorting task targeting visual scanning to the left of midline and sustained attention to task.  Pt required overall max assist verbal and visual cues to attend to items on the left of midline and mod assist verbal cues to sustain her attention to tasks in somewhat  distracting environment for 1-2 minute intervals.  Pt was returned to room and left upright in wheelchair with husband at bedside and quick release belt donned.  Continue per current plan of care.    Function:  Eating Eating                 Cognition Comprehension Comprehension assist level: Understands basic 75 - 89% of the time/ requires cueing 10 - 24% of the time  Expression   Expression assist level: Expresses basic 75 - 89% of the time/requires cueing 10 - 24% of the time. Needs helper to occlude trach/needs to repeat words.  Social Interaction Social Interaction assist level: Interacts appropriately 25 - 49% of time - Needs frequent redirection.  Problem Solving Problem solving assist level: Solves basic 25 - 49% of the time - needs direction more than half the time to initiate, plan or complete simple activities  Memory Memory assist level: Recognizes or recalls 25 - 49% of the time/requires cueing 50 - 75% of the time    Pain Pain Assessment Pain Assessment: No/denies pain   Therapy/Group: Individual Therapy  Bronc Brosseau, Selinda Orion 07/12/2015, 12:53 PM

## 2015-07-12 NOTE — Progress Notes (Signed)
Orthopedic Tech Progress Note Patient Details:  Becky Patterson 01-Sep-1955 [id number]  Patient ID: Becky Patterson, female   DOB: 07-Jul-1955, 60 y.o.   MRN: [id number] Called in advanced brace order; spoke with answering service  Sharlet Salina, Ellisyn Icenhower 07/12/2015, 10:09 AM

## 2015-07-12 NOTE — Progress Notes (Signed)
Physical Therapy Session Note  Patient Details  Name: Becky Patterson MRN: [id number] Date of Birth: July 17, 1955  Today's Date: 07/12/2015 PT Individual Time: 1300-1415 PT Individual Time Calculation (min): 75 min    Skilled Therapeutic Interventions/Progress Updates:  Patient in w/c eating her lunch , facilitate self feeding and reaching for food positioned past mid line in order to decrease inattention.  Transferred to therapy gym for training in sitting balance EOM, after a max of 2 stand pivot transfer. Reaching for bean bags held by tech in midline and progressively moving towards L side, with verbal cues for scanning and head turns , patient able to accommodate for changing conditions.  Sit to supine with max  A. In supine rolling task divided  And individual components trained( hands intertwined and movements in PNF patterns, hip flexed-twisting R and L and bridging) Rolling R and L as a whole task practiced with initially max A ,progressed to mod A for rolling to R- with repetition patient showed increased interest and due to small successes stayed engaged in task.  Supine to sit  With max A.  In sitting leaning R and L with weight shift to elbow and coming back to straight sitting, activity performed in order to increase core strength especially lumbar lateral flexors and facilitate rightening reactions in sitting. Cues for repositioning starting with adjustment in head position and manual assistance at abdominal level, second person behind patient in order to reduce backward leaning.  Attempted sit to stand from raised mat and by side rail with max A, patient is being tired and presents with increased L side push and lean, not safe to continue with standing activity.  At the end of session returned to room with all needs within reach, slight tilt in w/c ,QR belt on and family present.   Therapy Documentation Precautions:  Precautions Precautions: Fall Precaution Comments: Lt neglect ,  poor trunk control, decreased awareness Restrictions Weight Bearing Restrictions: No   See Function Navigator for Current Functional Status.   Therapy/Group: Individual Therapy  Guadlupe Spanish 07/12/2015, 3:34 PM

## 2015-07-12 NOTE — Progress Notes (Addendum)
Subjective/Complaints: Had a much better night. Slept more. Pain seemed improved as well. Itching controlled ROS- No SOB/CP No abd pain, - N/V, no blood in stool, stools dark  Objective: Vital Signs: Blood pressure 102/63, pulse 88, temperature 98.2 F (36.8 C), temperature source Oral, resp. rate 18, height '5\' 1"'  (1.549 m), weight 82.691 kg (182 lb 4.8 oz), SpO2 96 %. No results found. Results for orders placed or performed during the hospital encounter of 07/07/15 (from the past 72 hour(s))  Glucose, capillary     Status: Abnormal   Collection Time: 07/09/15 12:03 PM  Result Value Ref Range   Glucose-Capillary 127 (H) 65 - 99 mg/dL   Comment 1 Notify RN   Glucose, capillary     Status: Abnormal   Collection Time: 07/09/15  5:04 PM  Result Value Ref Range   Glucose-Capillary 138 (H) 65 - 99 mg/dL   Comment 1 Notify RN   Glucose, capillary     Status: Abnormal   Collection Time: 07/09/15  9:11 PM  Result Value Ref Range   Glucose-Capillary 137 (H) 65 - 99 mg/dL  Occult blood card to lab, stool RN will collect     Status: None   Collection Time: 07/10/15 12:05 AM  Result Value Ref Range   Fecal Occult Bld NEGATIVE NEGATIVE  Glucose, capillary     Status: Abnormal   Collection Time: 07/10/15  6:29 AM  Result Value Ref Range   Glucose-Capillary 156 (H) 65 - 99 mg/dL  Basic metabolic panel     Status: Abnormal   Collection Time: 07/10/15  8:30 AM  Result Value Ref Range   Sodium 137 135 - 145 mmol/L   Potassium 4.0 3.5 - 5.1 mmol/L   Chloride 100 (L) 101 - 111 mmol/L   CO2 25 22 - 32 mmol/L   Glucose, Bld 186 (H) 65 - 99 mg/dL   BUN 6 6 - 20 mg/dL   Creatinine, Ser 0.80 0.44 - 1.00 mg/dL   Calcium 9.0 8.9 - 10.3 mg/dL   GFR calc non Af Amer >60 >60 mL/min   GFR calc Af Amer >60 >60 mL/min    Comment: (NOTE) The eGFR has been calculated using the CKD EPI equation. This calculation has not been validated in all clinical situations. eGFR's persistently <60 mL/min signify  possible Chronic Kidney Disease.    Anion gap 12 5 - 15  Glucose, capillary     Status: Abnormal   Collection Time: 07/10/15 11:29 AM  Result Value Ref Range   Glucose-Capillary 109 (H) 65 - 99 mg/dL   Comment 1 Notify RN   Glucose, capillary     Status: Abnormal   Collection Time: 07/10/15  4:51 PM  Result Value Ref Range   Glucose-Capillary 103 (H) 65 - 99 mg/dL   Comment 1 Notify RN   Glucose, capillary     Status: Abnormal   Collection Time: 07/10/15  8:47 PM  Result Value Ref Range   Glucose-Capillary 162 (H) 65 - 99 mg/dL  Glucose, capillary     Status: Abnormal   Collection Time: 07/11/15  6:54 AM  Result Value Ref Range   Glucose-Capillary 148 (H) 65 - 99 mg/dL  Glucose, capillary     Status: Abnormal   Collection Time: 07/11/15 12:09 PM  Result Value Ref Range   Glucose-Capillary 141 (H) 65 - 99 mg/dL  Glucose, capillary     Status: Abnormal   Collection Time: 07/11/15  4:48 PM  Result Value Ref Range  Glucose-Capillary 129 (H) 65 - 99 mg/dL  Glucose, capillary     Status: Abnormal   Collection Time: 07/11/15  9:39 PM  Result Value Ref Range   Glucose-Capillary 147 (H) 65 - 99 mg/dL  Occult blood card to lab, stool RN will collect     Status: None   Collection Time: 07/12/15  1:16 AM  Result Value Ref Range   Fecal Occult Bld NEGATIVE NEGATIVE  Glucose, capillary     Status: Abnormal   Collection Time: 07/12/15  7:32 AM  Result Value Ref Range   Glucose-Capillary 112 (H) 65 - 99 mg/dL     HEENT: normal Cardio: RRR and no murmur Resp: CTA B/L and unlabored GI: BS positive and NT, ND Extremity:  No Edema Skin:   Intact and Other no heel breakdown, no evidence of rash Neuro: Lethargic, Confused, Flat, Cranial Nerve Abnormalities Left CN central 7, Abnormal Sensory reduced pinch sensation in LUE and LLE, Abnormal Motor 0/5 LUE and LLE, Tone:  clonus in Left ankle, Dysarthric and Inattention, left field cut Musc/Skel:  Other no pain with LUE or LLE ROM,  tenderness over the R trap. Left heel cord tight as well as finger flexors Gen NAD   Assessment/Plan: 1. Functional deficits secondary to Right MCA infarct with Left HP, Left neglect, Left hemisensory deficits which require 3+ hours per day of interdisciplinary therapy in a comprehensive inpatient rehab setting. Physiatrist is providing close team supervision and 24 hour management of active medical problems listed below. Physiatrist and rehab team continue to assess barriers to discharge/monitor patient progress toward functional and medical goals. FIM: Function - Bathing Position: Bed Body parts bathed by patient: Right upper leg Body parts bathed by helper: Left upper leg, Right lower leg, Left lower leg Bathing not applicable: Right arm, Left arm, Chest, Abdomen, Back (did not attempt this session) Assist Level: 2 helpers  Function- Upper Body Dressing/Undressing What is the patient wearing?: Pull over shirt/dress Pull over shirt/dress - Perfomed by patient: Put head through opening, Thread/unthread right sleeve Pull over shirt/dress - Perfomed by helper: Thread/unthread left sleeve, Pull shirt over trunk Function - Lower Body Dressing/Undressing What is the patient wearing?: Pants, Shoes Position: Sitting EOB Pants- Performed by helper: Thread/unthread right pants leg, Thread/unthread left pants leg, Pull pants up/down Non-skid slipper socks- Performed by helper: Don/doff right sock, Don/doff left sock Shoes - Performed by patient: Don/doff right shoe Shoes - Performed by helper: Don/doff left shoe Assist for lower body dressing: 2 Helpers  Function - Toileting Toileting activity did not occur: No continent bowel/bladder event Assist level: Two helpers  Function - Air cabin crew transfer activity did not occur: Safety/medical concerns Assist level to toilet: 2 helpers Assist level from toilet: 2 helpers  Function - Chair/bed transfer Chair/bed transfer method:  Squat pivot Chair/bed transfer assist level: 2 helpers Chair/bed transfer assistive device: Marine scientist lift: Maximove Chair/bed transfer details: Verbal cues for sequencing, Verbal cues for technique, Manual facilitation for weight shifting, Manual facilitation for placement, Manual facilitation for weight bearing  Function - Locomotion: Wheelchair Will patient use wheelchair at discharge?: Yes Type: Manual (TIS) Max wheelchair distance: 150 Assist Level: Dependent (Pt equals 0%) Assist Level: Dependent (Pt equals 0%) Assist Level: Dependent (Pt equals 0%) Function - Locomotion: Ambulation Ambulation activity did not occur: Safety/medical concerns  Function - Comprehension Comprehension: Auditory Comprehension assist level: Understands basic 75 - 89% of the time/ requires cueing 10 - 24% of the time  Function - Expression Expression: Verbal  Expression assist level: Expresses basic 75 - 89% of the time/requires cueing 10 - 24% of the time. Needs helper to occlude trach/needs to repeat words.  Function - Social Interaction Social Interaction assist level: Interacts appropriately 25 - 49% of time - Needs frequent redirection.  Function - Problem Solving Problem solving assist level: Solves basic 50 - 74% of the time/requires cueing 25 - 49% of the time  Function - Memory Memory assist level: Recognizes or recalls 25 - 49% of the time/requires cueing 50 - 75% of the time Patient normally able to recall (first 3 days only): Current season, Staff names and faces, That he or she is in a hospital  Medical Problem List and Plan: 1.  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 No antithrombotic needed.  -ordered left resting WHO and PRAFO to help preserve ROM  2.  DVT Prophylaxis/Anticoagulation: Pharmaceutical: Lovenox 3. Pain Management: Has chronic back pain per family. Will use tylenol prn for now  -topamax for  postroke headache  -tramadol in place of  Percocet due to potential itching  -seemed to sleep much better last night 4. Mood: LCSW to follow for evaluation and support.   5. Neuropsych: This patient is not fully capable of making decisions on her own behalf. 6. Skin/Wound Care: routine pressure relief measures.   7. Fluids/Electrolytes/Nutrition: Monitor I/O. Only taking 20-30% meals, Creat  , BUN 6 Getting dehydrated with honey liquids. Will need IVF at nights and need to encourage fluid intake during the day. Check lytes in am.   8. 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 PET scan on outpatient basis. 10. Hypokalemia: K+ 3.4 2/14 will recheck in am increase KCL supplement, repeat potassium 4.0 on 2/16 11. Cytotoxic edema: Weaned of hypertonic solution.    12. History of Maculopapular rash:  Likely due to antibiotics per ID and itching being managed with prn benadryl. Antibiotics narrowed to Ceftriaxone on 02/11--monitor for now. IF rash recurs will need to change back to imipenem. patient with itching but not rash, localized abdomen and back  Antibiotic D # 24--End date March 7th.   13. Anemia: Hgb low but has stayed in 7.6-8.0 range, check stool guaic was pos 1/23, neg on 2/2 and 2/16 14. Hypotension: Cont to monitor 15. Headache: topamax  LOS (Days) 5 A FACE TO FACE EVALUATION WAS PERFORMED  SWARTZ,ZACHARY T 07/12/2015, 9:14 AM

## 2015-07-13 ENCOUNTER — Inpatient Hospital Stay (HOSPITAL_COMMUNITY): Payer: 59 | Admitting: Occupational Therapy

## 2015-07-13 LAB — GLUCOSE, CAPILLARY
Glucose-Capillary: 132 mg/dL — ABNORMAL HIGH (ref 65–99)
Glucose-Capillary: 107 mg/dL — ABNORMAL HIGH (ref 65–99)
Glucose-Capillary: 118 mg/dL — ABNORMAL HIGH (ref 65–99)
Glucose-Capillary: 152 mg/dL — ABNORMAL HIGH (ref 65–99)

## 2015-07-13 LAB — OCCULT BLOOD X 1 CARD TO LAB, STOOL: FECAL OCCULT BLD: NEGATIVE

## 2015-07-13 NOTE — Progress Notes (Signed)
Subjective/Complaints: Sleeping better. Mild headache. Woke up to go to the [facility] and took medication for headache.  ROS- No SOB/CP No abd pain, - N/V, no blood in stool, stools dark  Objective: Vital Signs: Blood pressure 105/62, pulse 94, temperature 98.5 F (36.9 C), temperature source Oral, resp. rate 16, height '5\' 1"'$  (1.549 m), weight 82.78 kg (182 lb 8 oz), SpO2 93 %. No results found. Results for orders placed or performed during the hospital encounter of 07/07/15 (from the past 72 hour(s))  Glucose, capillary     Status: Abnormal   Collection Time: 07/10/15 11:29 AM  Result Value Ref Range   Glucose-Capillary 109 (H) 65 - 99 mg/dL   Comment 1 Notify RN   Glucose, capillary     Status: Abnormal   Collection Time: 07/10/15  4:51 PM  Result Value Ref Range   Glucose-Capillary 103 (H) 65 - 99 mg/dL   Comment 1 Notify RN   Glucose, capillary     Status: Abnormal   Collection Time: 07/10/15  8:47 PM  Result Value Ref Range   Glucose-Capillary 162 (H) 65 - 99 mg/dL  Glucose, capillary     Status: Abnormal   Collection Time: 07/11/15  6:54 AM  Result Value Ref Range   Glucose-Capillary 148 (H) 65 - 99 mg/dL  Glucose, capillary     Status: Abnormal   Collection Time: 07/11/15 12:09 PM  Result Value Ref Range   Glucose-Capillary 141 (H) 65 - 99 mg/dL  Glucose, capillary     Status: Abnormal   Collection Time: 07/11/15  4:48 PM  Result Value Ref Range   Glucose-Capillary 129 (H) 65 - 99 mg/dL  Glucose, capillary     Status: Abnormal   Collection Time: 07/11/15  9:39 PM  Result Value Ref Range   Glucose-Capillary 147 (H) 65 - 99 mg/dL  Occult blood card to lab, stool RN will collect     Status: None   Collection Time: 07/12/15  1:16 AM  Result Value Ref Range   Fecal Occult Bld NEGATIVE NEGATIVE  Glucose, capillary     Status: Abnormal   Collection Time: 07/12/15  7:32 AM  Result Value Ref Range   Glucose-Capillary 112 (H) 65 - 99 mg/dL  Glucose, capillary     Status:  Abnormal   Collection Time: 07/12/15 11:38 AM  Result Value Ref Range   Glucose-Capillary 112 (H) 65 - 99 mg/dL  Glucose, capillary     Status: Abnormal   Collection Time: 07/12/15  5:08 PM  Result Value Ref Range   Glucose-Capillary 109 (H) 65 - 99 mg/dL  Glucose, capillary     Status: Abnormal   Collection Time: 07/12/15  8:53 PM  Result Value Ref Range   Glucose-Capillary 179 (H) 65 - 99 mg/dL  Occult blood card to lab, stool RN will collect     Status: None   Collection Time: 07/13/15 12:06 AM  Result Value Ref Range   Fecal Occult Bld NEGATIVE NEGATIVE  Glucose, capillary     Status: Abnormal   Collection Time: 07/13/15  6:45 AM  Result Value Ref Range   Glucose-Capillary 132 (H) 65 - 99 mg/dL     HEENT: normal Cardio: RRR and no murmur Resp: CTA B/L and unlabored GI: BS positive and NT, ND Extremity:  No Edema Skin:   Intact and Other no heel breakdown, no evidence of rash Neuro: Lethargic, Confused, Flat, Cranial Nerve Abnormalities Left CN central 7, Abnormal Sensory reduced pinch sensation in LUE  and LLE, Abnormal Motor 0/5 LUE and LLE, Tone:  clonus in Left ankle, Dysarthric and Inattention, left field cut Musc/Skel:  Other no pain with LUE or LLE ROM, tenderness over the R trap. Left heel cord tight as well as finger flexors Gen NAD   Assessment/Plan: 1. Functional deficits secondary to Right MCA infarct with Left HP, Left neglect, Left hemisensory deficits which require 3+ hours per day of interdisciplinary therapy in a comprehensive inpatient rehab setting. Physiatrist is providing close team supervision and 24 hour management of active medical problems listed below. Physiatrist and rehab team continue to assess barriers to discharge/monitor patient progress toward functional and medical goals. FIM: Function - Bathing Position: Bed Body parts bathed by patient: Right upper leg, Front perineal area, Right lower leg, Left upper leg Body parts bathed by helper: Left  lower leg, Buttocks Bathing not applicable: Right arm, Left arm, Chest, Abdomen (pt connected to PICC line, could not change shirt at the time) Assist Level: 2 helpers  Function- Upper Body Dressing/Undressing What is the patient wearing?: Pull over shirt/dress Pull over shirt/dress - Perfomed by patient: Put head through opening, Thread/unthread right sleeve Pull over shirt/dress - Perfomed by helper: Thread/unthread left sleeve, Pull shirt over trunk Function - Lower Body Dressing/Undressing What is the patient wearing?: Pants, Socks, Shoes Position: Bed Pants- Performed by patient: Thread/unthread right pants leg Pants- Performed by helper: Thread/unthread left pants leg, Pull pants up/down Non-skid slipper socks- Performed by helper: Don/doff right sock, Don/doff left sock Socks - Performed by helper: Don/doff right sock, Don/doff left sock Shoes - Performed by patient: Don/doff right shoe Shoes - Performed by helper: Don/doff right shoe, Don/doff left shoe, Fasten right, Fasten left Assist for lower body dressing: 2 Helpers  Function - Psychologist, occupational activity did not occur: No continent bowel/bladder event Assist level: Two helpers  Function - Air cabin crew transfer activity did not occur: Safety/medical concerns Assist level to toilet: 2 helpers Assist level from toilet: 2 helpers  Function - Chair/bed transfer Chair/bed transfer method: Squat pivot Chair/bed transfer assist level: 2 helpers Chair/bed transfer assistive device: Armrests Mechanical lift: Maximove Chair/bed transfer details: Verbal cues for sequencing, Verbal cues for technique, Manual facilitation for weight shifting, Manual facilitation for placement, Manual facilitation for weight bearing  Function - Locomotion: Wheelchair Will patient use wheelchair at discharge?: Yes Type: Manual (TIS) Max wheelchair distance: 150 Assist Level: Dependent (Pt equals 0%) Assist Level: Dependent (Pt equals  0%) Assist Level: Dependent (Pt equals 0%) Function - Locomotion: Ambulation Ambulation activity did not occur: Safety/medical concerns  Function - Comprehension Comprehension: Auditory Comprehension assist level: Understands basic 75 - 89% of the time/ requires cueing 10 - 24% of the time  Function - Expression Expression: Verbal Expression assist level: Expresses basic 75 - 89% of the time/requires cueing 10 - 24% of the time. Needs helper to occlude trach/needs to repeat words.  Function - Social Interaction Social Interaction assist level: Interacts appropriately 25 - 49% of time - Needs frequent redirection.  Function - Problem Solving Problem solving assist level: Solves basic 25 - 49% of the time - needs direction more than half the time to initiate, plan or complete simple activities  Function - Memory Memory assist level: Recognizes or recalls 25 - 49% of the time/requires cueing 50 - 75% of the time Patient normally able to recall (first 3 days only): Current season, Staff names and faces, That he or she is in a hospital  Medical Problem List and Plan:  1.  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 No antithrombotic needed.  -ordered left resting WHO and PRAFO to help preserve ROM    -has received PRAFO so far 2.  DVT Prophylaxis/Anticoagulation: Pharmaceutical: Lovenox 3. Pain Management: Has chronic back pain per family. Will use tylenol prn for now  -topamax for postroke headache  -tramadol in place of  Percocet due to potential itching  -overall improvement in sleep/headache 4. Mood: LCSW to follow for evaluation and support.   5. Neuropsych: This patient is not fully capable of making decisions on her own behalf. 6. Skin/Wound Care: routine pressure relief measures.   7. Fluids/Electrolytes/Nutrition: Monitor I/O. Only taking 20-30% meals, Creat  , BUN 6 Getting dehydrated with honey liquids.   IVF at  nights and need to encourage fluid intake during the day.  8. 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 PET scan on outpatient basis. 10. Hypokalemia: K+ 3.4 2/14 will recheck in am increase KCL supplement, repeat potassium 4.0 on 2/16 11. Cytotoxic edema: Weaned of hypertonic solution.    12. History of Maculopapular rash:  Likely due to antibiotics per ID and itching being managed with prn benadryl. Antibiotics narrowed to Ceftriaxone on 02/11--monitor for now. IF rash recurs will need to change back to imipenem. patient with itching but not rash, localized abdomen and back  Antibiotic-End date March 7th.   13. Anemia: Hgb low but has stayed in 7.6-8.0 range, check stool guaic was pos 1/23, neg on 2/2 and 2/16  -follow up labs monday 14. Hypotension: Cont to monitor 15. Headache: topamax  LOS (Days) 6 A FACE TO FACE EVALUATION WAS PERFORMED  SWARTZ,ZACHARY T 07/13/2015, 8:47 AM

## 2015-07-13 NOTE — Progress Notes (Signed)
Occupational Therapy Session Note  Patient Details  Name: Becky Patterson MRN: [id number] Date of Birth: 1956/04/02  Today's Date: 07/13/2015 OT Individual Time: 7588-3254 OT Individual Time Calculation (min): 60 min    Short Term Goals: Week 1:  OT Short Term Goal 1 (Week 1): Pt will maintain static sitting balance EOB for 2 mins with close supervision in preparation for selfcare tasks.  OT Short Term Goal 2 (Week 1): Pt will complete UB bathing and dressing sitting supported with min assist. OT Short Term Goal 3 (Week 1): Pt will transfer squat pivot to the drop arm commode with max assist of 1 person.  OT Short Term Goal 4 (Week 1): Pt will locate all grooming and bathing items placed left of midline with no more than mod instructional cueing. OT Short Term Goal 5 (Week 1): Pt's spouse will return demonstrate safe assist with LUE PROM exercises following handout.    Skilled Therapeutic Interventions/Progress Updates:Upon approach for therapy, patient stated she needed to use bed pan. This clinician and nursing assisted her after it took a little while to void and have BM.   Patient required Total A of 1 to 2 to  Complete cleasnsing and changing brief.    Patient required Max A x 2 for supine to EOB; Total A x2 to sit EOB..   Attempted stand pivot transfer but patient pushing very resistively with upper body and not able to provide any weight bearing and assist in the transfer.   Nursing transfer via lift to w/c.   This clinician and nursing provided w/c positioning with Total A x 2.  Husband present and watching the sessi0n.  He stated he'd just just pace maker surgery.  Patient was left sitting up in w/c belted and with call bell in place and husband in the room with her.   Husband was instructed to call staff before leaving in order that platient positioning could be assessed regularly and safely by staff.      Therapy Documentation Precautions:  Precautions Precautions:  Fall Precaution Comments: Lt neglect , poor trunk control, decreased awareness Restrictions Weight Bearing Restrictions: No Therapy Vitals Temp: 98.5 F (36.9 C) Temp Source: Oral Pulse Rate: 80 Resp: 16 BP: (!) 101/53 mmHg Patient Position (if appropriate): Lying Oxygen Therapy SpO2: 96 % O2 Device: Not Delivered Pain:denied      See Function Navigator for Current Functional Status.   Therapy/Group: Individual Therapy  Alfredia Ferguson [facility] 07/13/2015, 4:06 PM

## 2015-07-14 ENCOUNTER — Inpatient Hospital Stay (HOSPITAL_COMMUNITY): Payer: 59

## 2015-07-14 ENCOUNTER — Inpatient Hospital Stay (HOSPITAL_COMMUNITY): Payer: 59 | Admitting: Occupational Therapy

## 2015-07-14 ENCOUNTER — Inpatient Hospital Stay (HOSPITAL_COMMUNITY): Payer: 59 | Admitting: Speech Pathology

## 2015-07-14 ENCOUNTER — Inpatient Hospital Stay (HOSPITAL_COMMUNITY): Payer: 59 | Admitting: Physical Therapy

## 2015-07-14 LAB — CREATININE, SERUM
Creatinine, Ser: 0.85 mg/dL (ref 0.44–1.00)
GFR calc Af Amer: >60 mL/min (ref >60)
GFR calc non Af Amer: >60 mL/min (ref >60)

## 2015-07-14 LAB — GLUCOSE, CAPILLARY
Glucose-Capillary: 109 mg/dL — ABNORMAL HIGH (ref 65–99)
Glucose-Capillary: 112 mg/dL — ABNORMAL HIGH (ref 65–99)
Glucose-Capillary: 117 mg/dL — ABNORMAL HIGH (ref 65–99)
Glucose-Capillary: 126 mg/dL — ABNORMAL HIGH (ref 65–99)

## 2015-07-14 LAB — CBC
HCT: 26.1 % — ABNORMAL LOW (ref 36.0–46.0)
Hemoglobin: 8.1 g/dL — ABNORMAL LOW (ref 12.0–15.0)
MCH: 29.2 pg (ref 26.0–34.0)
MCHC: 31 g/dL (ref 30.0–36.0)
MCV: 94.2 fL (ref 78.0–100.0)
Platelets: 403 10*3/uL — ABNORMAL HIGH (ref 150–400)
RBC: 2.77 MIL/uL — ABNORMAL LOW (ref 3.87–5.11)
RDW: 16 % — AB (ref 11.5–15.5)
WBC: 8.9 10*3/uL (ref 4.0–10.5)

## 2015-07-14 MED ORDER — CHLORHEXIDINE GLUCONATE 0.12 % MT SOLN
OROMUCOSAL | Status: AC
  Filled 2015-07-14: qty 15

## 2015-07-14 MED ORDER — ENSURE ENLIVE PO LIQD
237.0000 mL | Freq: Two times a day (BID) | ORAL | Status: DC
  Administered 2015-07-16: 237 mL via ORAL

## 2015-07-14 NOTE — Progress Notes (Signed)
Occupational Therapy Note  Patient Details  Name: Becky Patterson MRN: [id number] Date of Birth: 12-07-1955  Today's Date: 07/14/2015 OT Individual Time: 1100-1130 OT Individual Time Calculation (min): 30 min   Pt denied pain Individual therapy  Pt resting in TIS w/c upon arrival with husband present.  Pt engaged in therapeutic activities with emphasis on scanning left to locate objects placed in left field of vision/environment.  Pt required max verbal cues to scan to left to locate items.  Pt noted with decreased LUE/LLE sensation with initial testing. Pt remained in w/c with call bell within reach and husband present.   Leotis Shames [facility] 07/14/2015, 12:15 PM

## 2015-07-14 NOTE — Progress Notes (Signed)
Initial Nutrition Assessment  DOCUMENTATION CODES:   Obesity unspecified  INTERVENTION:  Provide Ensure Enlive po BID (thickened to appropriate consistency), each supplement provides 350 kcal and 20 grams of protein.  Provide nourishment snacks between meals. (Magic cup, pudding)  Encourage adequate PO intake.   NUTRITION DIAGNOSIS:   Inadequate oral intake related to lethargy/confusion, dysphagia as evidenced by  (varied meal completion 5-85%).  GOAL:   Patient will meet greater than or equal to 90% of their needs  MONITOR:   PO intake, Supplement acceptance, Weight trends, Labs, I & O's, Diet advancement, Skin  REASON FOR ASSESSMENT:   Low Braden    ASSESSMENT:   60 y.o. 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 She was admitted on 06/29/15 with acute onset of left sided weakness with slurred speech and left neglect.   Pt was resting during time of visit and did not respond to RD visit. Family at bedside. He reports appetite has been varied, however pt likely with poor po intake if she is tired. Meal completion has been 5-85% with 5% at breakfast today. Family reports, pt eats well PTA with consumption of at least 3 meals a day with no other difficulties. Noted pt weight weight loss since rehab admission. RD to closely monitor. RD to order Ensure to aid in caloric and protein needs. Family reports to encourage pt to eat at meals.   Pt with no observed significant fat or muscle mass loss.   Labs and medications reviewed.   Diet Order:  DIET DYS 3 Room service appropriate?: Yes; Fluid consistency:: Honey Thick  Skin:  Wound (see comment) (Stage I on buttocks)  Last BM:  2/19  Height:   Ht Readings from Last 1 Encounters:  07/07/15 '5\' 1"'$  (1.549 m)    Weight:   Wt Readings from Last 1 Encounters:  07/14/15 183 lb 8 oz (83.235 kg)    Ideal Body Weight:   47.7 kg  BMI:  Body mass index is 34.69 kg/(m^2).  Estimated Nutritional Needs:   Kcal:  2993-7169  Protein:  85-100 grams  Fluid:  1.7 - 1.9 L/day  EDUCATION NEEDS:   No education needs identified at this time  Corrin Parker, MS, RD, LDN Pager # [phone] After hours/ weekend pager # [phone]

## 2015-07-14 NOTE — Progress Notes (Signed)
Speech Language Pathology Daily Session Note  Patient Details  Name: Becky Patterson MRN: [id number] Date of Birth: 02/27/1956  Today's Date: 07/14/2015 SLP Individual Time: 8768-1157 SLP Individual Time Calculation (min): 60 min  Short Term Goals: Week 1: SLP Short Term Goal 1 (Week 1): Pt will attend to the left in >50% of observable opportunities with max assist verbal cues.   SLP Short Term Goal 2 (Week 1): Pt will sustain her attention to basic familiar tasks for 3-5 minute intervals with mod assist verbal cues for redirection.  SLP Short Term Goal 3 (Week 1): Pt will consume her currently prescribed diet with min verbal cues for use of swallowing precautions.   SLP Short Term Goal 4 (Week 1): Pt will consume therapeutic trials of ice chips with minimal overt s/s of aspiration to determine readiness for repeat objective study.  SLP Short Term Goal 5 (Week 1): Pt will identify at least 2 deficits occurring s/p CVA with mod question cues.    Skilled Therapeutic Interventions:  Pt was seen for skilled ST targeting goals for dysphagia and cognition.  SLP facilitated the session with trials of ice chips and thin liquids via cup sips to continue working towards liquids advancement.  Pt demonstrated soft, delayed cough following large consecutive boluses of thin liquids via cup sips; however, no overt s/s of aspiration evident with small sips of thin liquids and teaspoons of ice chips.  Recommend MBS at next available appointment to objectively determine readiness for diet progression.  SLP also utilized the Dynavision to address task initiation, sustained attention, and visual scanning to the left of midline.  Pt initially was only able to identify 3 targets within 1 minute; which improved to 9 targets in 1 minute with max assist verbal and tactile cues for initiation.  Pt able to sustain attention to task for ~30 seconds with mod verbal cues for redirection.  Pt noticeably fatigued during today's  session.  Pt reported having been administered pain medication prior to SLP's arrival for headache.  Pt left in wheelchair with quick release belt donned.  Call bell left within reach.  Continue per current plan of care.     Function:  Eating Eating   Modified Consistency Diet: Yes Eating Assist Level: Supervision or verbal cues   Eating Set Up Assist For: Opening containers       Cognition Comprehension Comprehension assist level: Understands basic 75 - 89% of the time/ requires cueing 10 - 24% of the time  Expression   Expression assist level: Expresses basic 75 - 89% of the time/requires cueing 10 - 24% of the time. Needs helper to occlude trach/needs to repeat words.  Social Interaction Social Interaction assist level: Interacts appropriately 25 - 49% of time - Needs frequent redirection.  Problem Solving Problem solving assist level: Solves basic 25 - 49% of the time - needs direction more than half the time to initiate, plan or complete simple activities  Memory Memory assist level: Recognizes or recalls 25 - 49% of the time/requires cueing 50 - 75% of the time    Pain Pain Assessment Pain Assessment: No/denies pain  Therapy/Group: Individual Therapy  Lynleigh Kovack, Selinda Orion 07/14/2015, 3:51 PM

## 2015-07-14 NOTE — Progress Notes (Signed)
Occupational Therapy Weekly Progress Note  Patient Details  Name: Becky Patterson MRN: [id number] Date of Birth: [birth year]  Beginning of progress report period: July 08, 2015 End of progress report period: July 14, 2015  Today's Date: 07/14/2015 OT Individual Time: 1300-1400 OT Individual Time Calculation (min): 60 min    Patient has met 1 of 5 short term goals.  She continues to demonstrates significant left neglect, left hemiparesis, decreased awareness, decreased trunk control, and pusher strategies to the left side.  Overall she continues to need total +2 (pt 30%) for all LB selfcare, toileting, and toilet transfers.  Max instructional cueing needed for scanning left of midline to locate grooming items and food on her meal tray.  Total assist overall for dynamic sitting balance EOB or mat with increased LOB posteriorly and to the right.  She does best when given a target on the right side to maintain contact with, but does not sustain secondary to decreased selective attention.  Husband has been educated on PROM exercises for the LUE as well as on positioning and ideas to promote scanning to the left side.  Feel she still needs extensive CIR level rehab at this time but is making slow but steady progress.  Feel her deficits are severe enough that she will need 3-4 weeks just to possibly reach mod assist level at this time.    Patient continues to demonstrate the following deficits: decreased balance, decreased awareness, decreased trunk control, left hemiparesis, and therefore will continue to benefit from skilled OT intervention to enhance overall performance with BADL.  Patient progressing toward long term goals..  Continue plan of care.  OT Short Term Goals Week 2:  OT Short Term Goal 1 (Week 2): Pt will maintain static sitting balance EOB for 2 mins with close supervision in preparation for selfcare tasks.  OT Short Term Goal 2 (Week 2): Pt will complete UB bathing and dressing  sitting supported with min assist. OT Short Term Goal 3 (Week 2): Pt will transfer squat pivot to the drop arm commode with max assist of 1 person.  OT Short Term Goal 4 (Week 2): Pt will locate all grooming and bathing items placed left of midline with no more than mod instructional cueing. OT Short Term Goal 5 (Week 2): Pt will use the LUE during bathing sessions with max assist.    Skilled Therapeutic Interventions/Progress Updates:    Pt transferred from the bed to wheelchair with total assist squat pivot to the right side.  Went down to the OT gym and transferred to the therapy mat with total assist Bobath technique to work on trunk control and scanning.  Attempted use of mirror for feedback, however this did not help.  Had pt work on scanning left of midline for locating matching cards with ones she was having to reach for laterally to the right side to decrease pushing. .  Moderate difficulty with this task, even though therapist had cards just slightly left of midline.  Moved cards to table top on bedside table and reduced number, however pt still needing max instructional cueing and at times demonstrational cueing, with therapist assisting her with turning her head further to the left side.  Pt with increased LOB posterior and to the left, requiring total assist to correct.  Gave her a target to move toward with her right shoulder but she could not maintain her body on the target and would slowly fall back toward the left again.  Transferred back to wheelchair  with total assist Bobath method to the left side to complete session.  Pt left in tilt in space chair with safety belt in place and husband present.     Therapy Documentation Precautions:  Precautions Precautions: Fall Precaution Comments: Lt neglect , poor trunk control, decreased awareness Restrictions Weight Bearing Restrictions: No  Pain: Pain Assessment Pain Assessment: No/denies pain ADL: See Function Navigator for Current  Functional Status.   Therapy/Group: Individual Therapy  Dolores Mcgovern OTR/L 07/14/2015, 3:44 PM

## 2015-07-14 NOTE — Progress Notes (Signed)
Physical Therapy Session Note  Patient Details  Name: Becky Patterson MRN: [id number] Date of Birth: 07/22/1955  Today's Date: 07/14/2015 PT Individual Time: 0800-0900 PT Individual Time Calculation (min): 60 min   Short Term Goals: Week 1:  PT Short Term Goal 1 (Week 1): pt will maintain static sitting balance with min A PT Short Term Goal 2 (Week 1): Pt will perform functional transfers with max A PT Short Term Goal 3 (Week 1): pt will attend to therapeutic task x 1 minute with min cuing  Skilled Therapeutic Interventions/Progress Updates:    Pt received seated in bed, denies pain and agreeable to treatment. Attends to therapist on L side with increased time and cueing. Pt doffs/dons shirt with modA, verbal cues for sequencing and technique to thread LUE first. Pants donned maxA, rolling R/L with bedrails minA to L, maxA to R. Supine>sit maxA and upon sitting required totalA for seated balance due to consistent LOB to L side. Pt cued for RUE reaching towards foot of bed to grab footboard to A with balance and midline retraining. Lateral scoot to w/c with +2A and pt assisting with scooting and reaching toward chair with RUE. During transfer, pt cued for RUE to hold onto LUE to prevent pushing. Transfer w/c <>mat table to R side with +2A. Seated balance on edge of mat table with dynamic RUE reaching to L side to retrieve horseshoes, then throwing horseshoe to target on L side for L attention. Sidesitting on R elbow x10 reps with pt returning to midline each time with mirror visual feedback for improved midline orientation. Educated pt's husband on pusher syndrome, impaired perception of midline, and purpose of activity. Pt returned to w/c as described above. Remained seated semi-reclined in tilt-in-space w/c, quick release belt intact and all needs within reach at completion of session.   Therapy Documentation Precautions:  Precautions Precautions: Fall Precaution Comments: Lt neglect , poor  trunk control, decreased awareness Restrictions Weight Bearing Restrictions: No Pain: Pain Assessment Pain Assessment: No/denies pain Pain Score: 0-No pain   See Function Navigator for Current Functional Status.   Therapy/Group: Individual Therapy  [name] 07/14/2015, 9:49 AM

## 2015-07-14 NOTE — Progress Notes (Signed)
Subjective/Complaints: Slept well last noc , no itching no HA Toileted on bed pan last noc ROS- No SOB/CP No abd pain, - N/V, no blood in stool,   Objective: Vital Signs: Blood pressure 104/64, pulse 96, temperature 98.5 F (36.9 C), temperature source Oral, resp. rate 18, height 5' 1" (1.549 m), weight 83.235 kg (183 lb 8 oz), SpO2 95 %. No results found. Results for orders placed or performed during the hospital encounter of 07/07/15 (from the past 72 hour(s))  Glucose, capillary     Status: Abnormal   Collection Time: 07/11/15 12:09 PM  Result Value Ref Range   Glucose-Capillary 141 (H) 65 - 99 mg/dL  Glucose, capillary     Status: Abnormal   Collection Time: 07/11/15  4:48 PM  Result Value Ref Range   Glucose-Capillary 129 (H) 65 - 99 mg/dL  Glucose, capillary     Status: Abnormal   Collection Time: 07/11/15  9:39 PM  Result Value Ref Range   Glucose-Capillary 147 (H) 65 - 99 mg/dL  Occult blood card to lab, stool RN will collect     Status: None   Collection Time: 07/12/15  1:16 AM  Result Value Ref Range   Fecal Occult Bld NEGATIVE NEGATIVE  Glucose, capillary     Status: Abnormal   Collection Time: 07/12/15  7:32 AM  Result Value Ref Range   Glucose-Capillary 112 (H) 65 - 99 mg/dL  Glucose, capillary     Status: Abnormal   Collection Time: 07/12/15 11:38 AM  Result Value Ref Range   Glucose-Capillary 112 (H) 65 - 99 mg/dL  Glucose, capillary     Status: Abnormal   Collection Time: 07/12/15  5:08 PM  Result Value Ref Range   Glucose-Capillary 109 (H) 65 - 99 mg/dL  Glucose, capillary     Status: Abnormal   Collection Time: 07/12/15  8:53 PM  Result Value Ref Range   Glucose-Capillary 179 (H) 65 - 99 mg/dL  Occult blood card to lab, stool RN will collect     Status: None   Collection Time: 07/13/15 12:06 AM  Result Value Ref Range   Fecal Occult Bld NEGATIVE NEGATIVE  Glucose, capillary     Status: Abnormal   Collection Time: 07/13/15  6:45 AM  Result Value  Ref Range   Glucose-Capillary 132 (H) 65 - 99 mg/dL  Glucose, capillary     Status: Abnormal   Collection Time: 07/13/15 11:38 AM  Result Value Ref Range   Glucose-Capillary 107 (H) 65 - 99 mg/dL  Glucose, capillary     Status: Abnormal   Collection Time: 07/13/15  4:45 PM  Result Value Ref Range   Glucose-Capillary 118 (H) 65 - 99 mg/dL  Glucose, capillary     Status: Abnormal   Collection Time: 07/13/15  8:57 PM  Result Value Ref Range   Glucose-Capillary 152 (H) 65 - 99 mg/dL  Creatinine, serum     Status: None   Collection Time: 07/14/15  3:15 AM  Result Value Ref Range   Creatinine, Ser 0.85 0.44 - 1.00 mg/dL   GFR calc non Af Amer >60 >60 mL/min   GFR calc Af Amer >60 >60 mL/min    Comment: (NOTE) The eGFR has been calculated using the CKD EPI equation. This calculation has not been validated in all clinical situations. eGFR's persistently <60 mL/min signify possible Chronic Kidney Disease.   CBC     Status: Abnormal   Collection Time: 07/14/15  3:15 AM  Result Value Ref  Range   WBC 8.9 4.0 - 10.5 K/uL   RBC 2.77 (L) 3.87 - 5.11 MIL/uL   Hemoglobin 8.1 (L) 12.0 - 15.0 g/dL   HCT 26.1 (L) 36.0 - 46.0 %   MCV 94.2 78.0 - 100.0 fL   MCH 29.2 26.0 - 34.0 pg   MCHC 31.0 30.0 - 36.0 g/dL   RDW 16.0 (H) 11.5 - 15.5 %   Platelets 403 (H) 150 - 400 K/uL     HEENT: normal Cardio: RRR and no murmur Resp: CTA B/L and unlabored GI: BS positive and NT, ND Extremity:  No Edema Skin:   Intact and Other no heel breakdown, no evidence of rash Neuro: Lethargic, Confused, Flat, Cranial Nerve Abnormalities Left CN central 7, Abnormal Sensory reduced pinch sensation in LUE and LLE, Abnormal Motor 0/5 LUE and LLE, Tone:  clonus in Left ankle, Dysarthric and Inattention, left field cut Musc/Skel:  Other no pain with LUE or LLE ROM, tenderness over the R trap. Left heel cord tight as well as finger flexors Gen NAD   Assessment/Plan: 1. Functional deficits secondary to Right MCA  infarct with Left HP, Left neglect, Left hemisensory deficits which require 3+ hours per day of interdisciplinary therapy in a comprehensive inpatient rehab setting. Physiatrist is providing close team supervision and 24 hour management of active medical problems listed below. Physiatrist and rehab team continue to assess barriers to discharge/monitor patient progress toward functional and medical goals. FIM: Function - Bathing Position: Bed Body parts bathed by patient: Right upper leg, Front perineal area, Right lower leg, Left upper leg Body parts bathed by helper: Left lower leg, Buttocks Bathing not applicable: Right arm, Left arm, Chest, Abdomen (pt connected to PICC line, could not change shirt at the time) Assist Level: 2 helpers  Function- Upper Body Dressing/Undressing What is the patient wearing?: Pull over shirt/dress Pull over shirt/dress - Perfomed by patient: Put head through opening, Thread/unthread right sleeve Pull over shirt/dress - Perfomed by helper: Thread/unthread left sleeve, Pull shirt over trunk Function - Lower Body Dressing/Undressing What is the patient wearing?: Pants, Socks, Shoes Position: Bed Pants- Performed by patient: Thread/unthread right pants leg Pants- Performed by helper: Thread/unthread left pants leg, Pull pants up/down Non-skid slipper socks- Performed by helper: Don/doff right sock, Don/doff left sock Socks - Performed by helper: Don/doff right sock, Don/doff left sock Shoes - Performed by patient: Don/doff right shoe Shoes - Performed by helper: Don/doff right shoe, Don/doff left shoe, Fasten right, Fasten left Assist for lower body dressing: 2 Helpers  Function - Psychologist, occupational activity did not occur: No continent bowel/bladder event Assist level: Two helpers  Function - Air cabin crew transfer activity did not occur: Safety/medical concerns Assist level to toilet: 2 helpers Assist level from toilet: 2 helpers  Function -  Chair/bed transfer Chair/bed transfer method: Squat pivot Chair/bed transfer assist level: 2 helpers Chair/bed transfer assistive device: Armrests Mechanical lift: Maximove Chair/bed transfer details: Verbal cues for sequencing, Verbal cues for technique, Manual facilitation for weight shifting, Manual facilitation for placement, Manual facilitation for weight bearing  Function - Locomotion: Wheelchair Will patient use wheelchair at discharge?: Yes Type: Manual (TIS) Max wheelchair distance: 150 Assist Level: Dependent (Pt equals 0%) Assist Level: Dependent (Pt equals 0%) Assist Level: Dependent (Pt equals 0%) Function - Locomotion: Ambulation Ambulation activity did not occur: Safety/medical concerns  Function - Comprehension Comprehension: Auditory Comprehension assist level: Understands basic 25 - 49% of the time/ requires cueing 50 - 75% of the time  Function - Expression Expression: Verbal Expression assist level: Expresses basic 50 - 74% of the time/requires cueing 25 - 49% of the time. Needs to repeat parts of sentences.  Function - Social Interaction Social Interaction assist level: Interacts appropriately 25 - 49% of time - Needs frequent redirection.  Function - Problem Solving Problem solving assist level: Solves basic 25 - 49% of the time - needs direction more than half the time to initiate, plan or complete simple activities  Function - Memory Memory assist level: Recognizes or recalls 25 - 49% of the time/requires cueing 50 - 75% of the time Patient normally able to recall (first 3 days only): Current season, Staff names and faces, That he or she is in a hospital  Medical Problem List and Plan: 1.  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 No antithrombotic needed.  -ordered left resting WHO and PRAFO to help preserve ROM    -has received PRAFO so far 2.  DVT Prophylaxis/Anticoagulation:  Pharmaceutical: Lovenox 3. Pain Management: Has chronic back pain per family. Will use tylenol prn for now  -topamax for postroke headache  -tramadol in place of  Percocet due to potential itching  -overall improvement in sleep/headache 4. Mood: LCSW to follow for evaluation and support.   5. Neuropsych: This patient is not fully capable of making decisions on her own behalf. 6. Skin/Wound Care: routine pressure relief measures.   7. Fluids/Electrolytes/Nutrition: Monitor I/O.Intake improved to   50% meals, Creat, .85   Getting dehydrated with honey liquids.   IVF at nights and need to encourage fluid intake during the day.  8. 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 PET scan on outpatient basis. 10. Hypokalemia: K+ 3.4 2/14 will recheck in am increase KCL supplement,  11. Cytotoxic edema: Weaned of hypertonic solution.    12. History of Maculopapular rash:  Likely due to antibiotics per ID and itching being managed with prn benadryl. Antibiotics narrowed to Ceftriaxone on 02/11--monitor for now. IF rash recurs will need to change back to imipenem.   Antibiotic-End date March 7th.  Itching improved of percocet 13. Anemia: Hgb low but has stayed in 7.6-8.0 range, check stool guaic was pos 1/23, neg on 2/2 and 2/16, Hgb stable at 8.1 today  -follow up labs monday 14. Hypotension: Cont to monitor 15. Headache: topamax 41m BID, Tramadol 1028mQ6h prn, improved  LOS (Days) 7 A FACE TO FACE EVALUATION WAS PERFORMED  KIRSTEINS,ANDREW E 07/14/2015, 7:01 AM

## 2015-07-15 ENCOUNTER — Inpatient Hospital Stay (HOSPITAL_COMMUNITY): Payer: 59 | Admitting: Occupational Therapy

## 2015-07-15 ENCOUNTER — Inpatient Hospital Stay (HOSPITAL_COMMUNITY): Payer: 59 | Admitting: Speech Pathology

## 2015-07-15 ENCOUNTER — Inpatient Hospital Stay (HOSPITAL_COMMUNITY): Payer: 59

## 2015-07-15 ENCOUNTER — Inpatient Hospital Stay (HOSPITAL_COMMUNITY): Payer: 59 | Admitting: Physical Therapy

## 2015-07-15 LAB — GLUCOSE, CAPILLARY
Glucose-Capillary: 105 mg/dL — ABNORMAL HIGH (ref 65–99)
Glucose-Capillary: 101 mg/dL — ABNORMAL HIGH (ref 65–99)
Glucose-Capillary: 131 mg/dL — ABNORMAL HIGH (ref 65–99)
Glucose-Capillary: 135 mg/dL — ABNORMAL HIGH (ref 65–99)

## 2015-07-15 MED ORDER — TOPIRAMATE 25 MG PO TABS
25.0000 mg | ORAL_TABLET | Freq: Two times a day (BID) | ORAL | Status: DC
  Administered 2015-07-15 (×2): 25 mg via ORAL
  Filled 2015-07-15: qty 1

## 2015-07-15 NOTE — Progress Notes (Signed)
Physical Therapy Note  Patient Details  Name: Becky Patterson MRN: [id number] Date of Birth: 01-19-1956 Today's Date: 07/15/2015    Time: 502-774 12 minutes  1:1 No c/o pain.  Pt requires mod A for rolling in bed with bedrails, max/total A for supine to sit. Sitting balance EOB x 10 minutes with pt able to maintain with supervision x 30 seconds with Rt UE on railing, pt requires intermittent total A due to delayed balance reactions, decreased awareness of LOB, impaired attention.  Stand/squat pivot transfer to w/c with max A with +2 for safety.  Pt able to stand on Rt LE and requires assist for pivoting.  Seated edge of mat with reaching activity with focus on midline posture with mod/max A depending on fatigue and attention.  Sit to stand X 3 with max A, pt with improved strength noted in Rt LE.  Attempt standing with Stedy, pt able to stand with stedy but with difficulty with sitting in stedy. Pt requires mod A to stand in stedy but max A for trunk control.  Will continue to work on [employer] to improve toilet transfers and mobility.  Josh Nicolosi 07/15/2015, 8:47 AM

## 2015-07-15 NOTE — Progress Notes (Signed)
Speech Language Pathology Note  Patient Details  Name: DENEANE STIFTER MRN: [id number] Date of Birth: May 25, 1955 Today's Date: 07/15/2015  Attempted to see pt for MBS at 9 AM.  Pt in therapy when transport arrived and then using bedpan when transport returned.  MBS rescheduled for 1 PM; however, pt on toilet when transport arrived and transport to radiology was delayed until 1340.  As a a result, pt missed 45 minutes of skilled ST treatment time to allow for completion of MBS during SLP's scheduled appointment.    MBSS complete. Full report located under chart review in imaging section.   Irma Delancey, Selinda Orion 07/15/2015, 4:34 PM

## 2015-07-15 NOTE — Progress Notes (Signed)
Physical Therapy Session Note  Patient Details  Name: Becky Patterson MRN: [id number] Date of Birth: 1956/03/24  Today's Date: 07/15/2015 PT Co-Treatment Time: 1130-1200 (total time 1100-1200) PT Co-Treatment Time Calculation (min): 30 min  Short Term Goals: Week 2:  PT Short Term Goal 1 (Week 2): pt will maintain static sitting balance with min A PT Short Term Goal 2 (Week 2): pt will perform functional transfers with max A PT Short Term Goal 3 (Week 2): pt will attend to therapeutic task x 1 minute with min cuing  Skilled Therapeutic Interventions/Progress Updates:    Skilled co-treat with OT Gregary Signs for 60 min with focus on functional bed mobility, sitting balance, transfers, and sit <>stand. Rolling L in bed with mod verbal cues and minA, rolling to R with maxA due to LUE flaccid, and poor initiation with LLE; performed to doff/don pants and brief and to perform washing lower extremities with RUE. Supine>sit with maxA and verbal cues for assistance with RUE to push up. Seated balance on EOB with modA for scooting forward for feet flat on floor. Initially strong L lateral lean and pt cued for RUE to reach for footboard to bring body to midline. Upper body bathing with RUE performing washing chest, arm with modA. Squat pivot transfer bed>w/c maxA +2, cues for hand placement and pushing through LEs. Sit <>stand at parallel bars with RUE on bars, maxA +1, second person assist for safety due to balance impairments. Cues/facilitation at L knee, quads and glutes for stance control and weight bearing. Remained seated in w/c with quick release belt intact and handoff to husband to return to room.    Therapy Documentation Precautions:  Precautions Precautions: Fall Precaution Comments: Lt neglect , poor trunk control, decreased awareness Restrictions Weight Bearing Restrictions: No Pain: Pain Assessment Pain Assessment: No/denies pain   See Function Navigator for Current Functional  Status.   Therapy/Group: Co-Treatment  [name] 07/15/2015, 12:10 PM

## 2015-07-15 NOTE — Progress Notes (Signed)
Subjective/Complaints: Asking for bed pan, sister assisting ROS- No SOB/CP No abd pain, - N/V, no blood in stool,   Objective: Vital Signs: Blood pressure 106/62, pulse 99, temperature 98.3 F (36.8 C), temperature source Oral, resp. rate 17, height '5\' 1"'  (1.549 m), weight 82.3 kg (181 lb 7 oz), SpO2 99 %. No results found. Results for orders placed or performed during the hospital encounter of 07/07/15 (from the past 72 hour(s))  Glucose, capillary     Status: Abnormal   Collection Time: 07/12/15  7:32 AM  Result Value Ref Range   Glucose-Capillary 112 (H) 65 - 99 mg/dL  Glucose, capillary     Status: Abnormal   Collection Time: 07/12/15 11:38 AM  Result Value Ref Range   Glucose-Capillary 112 (H) 65 - 99 mg/dL  Glucose, capillary     Status: Abnormal   Collection Time: 07/12/15  5:08 PM  Result Value Ref Range   Glucose-Capillary 109 (H) 65 - 99 mg/dL  Glucose, capillary     Status: Abnormal   Collection Time: 07/12/15  8:53 PM  Result Value Ref Range   Glucose-Capillary 179 (H) 65 - 99 mg/dL  Occult blood card to lab, stool RN will collect     Status: None   Collection Time: 07/13/15 12:06 AM  Result Value Ref Range   Fecal Occult Bld NEGATIVE NEGATIVE  Glucose, capillary     Status: Abnormal   Collection Time: 07/13/15  6:45 AM  Result Value Ref Range   Glucose-Capillary 132 (H) 65 - 99 mg/dL  Glucose, capillary     Status: Abnormal   Collection Time: 07/13/15 11:38 AM  Result Value Ref Range   Glucose-Capillary 107 (H) 65 - 99 mg/dL  Glucose, capillary     Status: Abnormal   Collection Time: 07/13/15  4:45 PM  Result Value Ref Range   Glucose-Capillary 118 (H) 65 - 99 mg/dL  Glucose, capillary     Status: Abnormal   Collection Time: 07/13/15  8:57 PM  Result Value Ref Range   Glucose-Capillary 152 (H) 65 - 99 mg/dL  Creatinine, serum     Status: None   Collection Time: 07/14/15  3:15 AM  Result Value Ref Range   Creatinine, Ser 0.85 0.44 - 1.00 mg/dL   GFR  calc non Af Amer >60 >60 mL/min   GFR calc Af Amer >60 >60 mL/min    Comment: (NOTE) The eGFR has been calculated using the CKD EPI equation. This calculation has not been validated in all clinical situations. eGFR's persistently <60 mL/min signify possible Chronic Kidney Disease.   CBC     Status: Abnormal   Collection Time: 07/14/15  3:15 AM  Result Value Ref Range   WBC 8.9 4.0 - 10.5 K/uL   RBC 2.77 (L) 3.87 - 5.11 MIL/uL   Hemoglobin 8.1 (L) 12.0 - 15.0 g/dL   HCT 26.1 (L) 36.0 - 46.0 %   MCV 94.2 78.0 - 100.0 fL   MCH 29.2 26.0 - 34.0 pg   MCHC 31.0 30.0 - 36.0 g/dL   RDW 16.0 (H) 11.5 - 15.5 %   Platelets 403 (H) 150 - 400 K/uL  Glucose, capillary     Status: Abnormal   Collection Time: 07/14/15  6:46 AM  Result Value Ref Range   Glucose-Capillary 109 (H) 65 - 99 mg/dL  Glucose, capillary     Status: Abnormal   Collection Time: 07/14/15 11:54 AM  Result Value Ref Range   Glucose-Capillary 112 (H) 65 -  99 mg/dL   Comment 1 Notify RN   Glucose, capillary     Status: Abnormal   Collection Time: 07/14/15  4:43 PM  Result Value Ref Range   Glucose-Capillary 117 (H) 65 - 99 mg/dL   Comment 1 Notify RN   Glucose, capillary     Status: Abnormal   Collection Time: 07/14/15  9:41 PM  Result Value Ref Range   Glucose-Capillary 126 (H) 65 - 99 mg/dL  Glucose, capillary     Status: Abnormal   Collection Time: 07/15/15  6:34 AM  Result Value Ref Range   Glucose-Capillary 105 (H) 65 - 99 mg/dL     HEENT: normal Cardio: RRR and no murmur Resp: CTA B/L and unlabored GI: BS positive and NT, ND Extremity:  No Edema Skin:   Intact and Other no heel breakdown, no evidence of rash Neuro: Lethargic, Confused, Flat, Cranial Nerve Abnormalities Left CN central 7, Abnormal Sensory reduced pinch sensation in LUE and LLE, Abnormal Motor 0/5 LUE and LLE, Tone:  clonus in Left ankle, Dysarthric and Inattention, left field cut Musc/Skel:  Other no pain with LUE or LLE ROM, tenderness over  the R trap. Left heel cord tight as well as finger flexors Gen NAD   Assessment/Plan: 1. Functional deficits secondary to Right MCA infarct with Left HP, Left neglect, Left hemisensory deficits which require 3+ hours per day of interdisciplinary therapy in a comprehensive inpatient rehab setting. Physiatrist is providing close team supervision and 24 hour management of active medical problems listed below. Physiatrist and rehab team continue to assess barriers to discharge/monitor patient progress toward functional and medical goals. FIM: Function - Bathing Position: Bed Body parts bathed by patient: Right upper leg, Front perineal area, Right lower leg, Left upper leg Body parts bathed by helper: Left lower leg, Buttocks Bathing not applicable: Right arm, Left arm, Chest, Abdomen (pt connected to PICC line, could not change shirt at the time) Assist Level: 2 helpers  Function- Upper Body Dressing/Undressing What is the patient wearing?: Pull over shirt/dress Pull over shirt/dress - Perfomed by patient: Put head through opening, Thread/unthread right sleeve Pull over shirt/dress - Perfomed by helper: Pull shirt over trunk, Thread/unthread left sleeve Assist Level: Touching or steadying assistance(Pt > 75%) (modA) Function - Lower Body Dressing/Undressing What is the patient wearing?: Pants, Socks, Shoes Position: Bed Pants- Performed by patient: Thread/unthread right pants leg Pants- Performed by helper: Thread/unthread left pants leg, Pull pants up/down Non-skid slipper socks- Performed by helper: Don/doff right sock, Don/doff left sock Socks - Performed by helper: Don/doff right sock, Don/doff left sock Shoes - Performed by patient: Don/doff right shoe Shoes - Performed by helper: Don/doff right shoe, Don/doff left shoe, Fasten right, Fasten left Assist for lower body dressing: 2 Helpers  Function - Toileting Toileting activity did not occur: No continent bowel/bladder  event Assist level: Two helpers  Function - Air cabin crew transfer activity did not occur: Safety/medical concerns Assist level to toilet: 2 helpers Assist level from toilet: 2 helpers  Function - Chair/bed transfer Chair/bed transfer method: Squat pivot Chair/bed transfer assist level: 2 helpers Chair/bed transfer assistive device: Armrests Mechanical lift: Maximove Chair/bed transfer details: Verbal cues for sequencing, Verbal cues for technique, Manual facilitation for weight shifting, Manual facilitation for placement, Manual facilitation for weight bearing  Function - Locomotion: Wheelchair Will patient use wheelchair at discharge?: Yes Type: Manual (TIS) Max wheelchair distance: 150 Assist Level: Dependent (Pt equals 0%) Assist Level: Dependent (Pt equals 0%) Assist Level: Dependent (  Pt equals 0%) Function - Locomotion: Ambulation Ambulation activity did not occur: Safety/medical concerns  Function - Comprehension Comprehension: Auditory Comprehension assist level: Understands basic 75 - 89% of the time/ requires cueing 10 - 24% of the time  Function - Expression Expression: Verbal Expression assist level: Expresses basic 75 - 89% of the time/requires cueing 10 - 24% of the time. Needs helper to occlude trach/needs to repeat words.  Function - Social Interaction Social Interaction assist level: Interacts appropriately 25 - 49% of time - Needs frequent redirection.  Function - Problem Solving Problem solving assist level: Solves basic 25 - 49% of the time - needs direction more than half the time to initiate, plan or complete simple activities  Function - Memory Memory assist level: Recognizes or recalls 25 - 49% of the time/requires cueing 50 - 75% of the time Patient normally able to recall (first 3 days only): Current season, Staff names and faces, That he or she is in a hospital  Medical Problem List and Plan: 1.  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 No antithrombotic needed.Cont Abx thru 3/7  -ordered left resting WHO and PRAFO to help preserve ROM    -has received PRAFO so far 2.  DVT Prophylaxis/Anticoagulation: Pharmaceutical: Lovenox 3. Pain Management: Has chronic back pain per family. Will use tylenol prn for now  -topamax for postroke headache  -tramadol in place of  Percocet due to  itching  -overall improvement in sleep/headache 4. Mood: LCSW to follow for evaluation and support.   5. Neuropsych: This patient is not fully capable of making decisions on her own behalf. 6. Skin/Wound Care: routine pressure relief measures.   7. Fluids/Electrolytes/Nutrition: Monitor I/O.Intake 10-25% meals yesterday, will add Megace, reduce topamax dose    Getting dehydrated with honey liquids.   IVF at nights and need to encourage fluid intake during the day.  8. 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 PET scan on outpatient basis. 10. Hypokalemia: K+ 3.4 2/14 will recheck in am increase KCL supplement,  11. Cytotoxic edema: Weaned of hypertonic solution.    12. History of Maculopapular rash:  Likely due to antibiotics per ID and itching being managed with prn benadryl. Antibiotics narrowed to Ceftriaxone on 02/11--monitor for now. IF rash recurs will need to change back to imipenem.   Antibiotic-End date March 7th.  Itching improved off of percocet, will list as intolerance  13. Anemia: Hgb low but has stayed in 7.6-8.0 range, check stool guaic was pos 1/23, neg on 2/2 and 2/16, Hgb stable at 8.1 2/20  -follow up labs monday 14. Hypotension: Cont to monitor 15. Headache: topamax 66m BID, Tramadol 1042mQ6h prn, improved, ?anorexia SE from topamax will wean  LOS (Days) 8 A FACE TO FACE EVALUATION WAS PERFORMED  KIRSTEINS,ANDREW E 07/15/2015, 7:22 AM

## 2015-07-15 NOTE — Progress Notes (Signed)
Occupational Therapy Session Note  Patient Details  Name: Becky Patterson MRN: [id number] Date of Birth: 07-01-1955  Today's Date: 07/15/2015 OT Co-Treatment Time: 1100-1130 OT Co-Treatment Time Calculation (min): 30 min   Short Term Goals: Week 1:  OT Short Term Goal 1 (Week 1): Pt will maintain static sitting balance EOB for 2 mins with close supervision in preparation for selfcare tasks.  OT Short Term Goal 1 - Progress (Week 1): Not met OT Short Term Goal 2 (Week 1): Pt will complete UB bathing and dressing sitting supported with min assist. OT Short Term Goal 2 - Progress (Week 1): Not met OT Short Term Goal 3 (Week 1): Pt will transfer squat pivot to the drop arm commode with max assist of 1 person.  OT Short Term Goal 3 - Progress (Week 1): Not met OT Short Term Goal 4 (Week 1): Pt will locate all grooming and bathing items placed left of midline with no more than mod instructional cueing. OT Short Term Goal 4 - Progress (Week 1): Not met OT Short Term Goal 5 (Week 1): Pt's spouse will return demonstrate safe assist with LUE PROM exercises following handout.   OT Short Term Goal 5 - Progress (Week 1): Met Week 2:  OT Short Term Goal 1 (Week 2): Pt will maintain static sitting balance EOB for 2 mins with close supervision in preparation for selfcare tasks.  OT Short Term Goal 2 (Week 2): Pt will complete UB bathing and dressing sitting supported with min assist. OT Short Term Goal 3 (Week 2): Pt will transfer squat pivot to the drop arm commode with max assist of 1 person.  OT Short Term Goal 4 (Week 2): Pt will locate all grooming and bathing items placed left of midline with no more than mod instructional cueing. OT Short Term Goal 5 (Week 2): Pt will use the LUE during bathing sessions with max assist.       Skilled Therapeutic Interventions/Progress Updates:    Pt seen for skilled OT and cotx with PT to facilitate pt's L side awareness, functional mobility, and postural control  in both sitting and standing. Pt received in bed and worked on active use of legs and trunk for rolling several times for LB selfcare. Pt sat to EOB with max A and worked on static sitting, maintaining midline with multimodal cues and varing levels of support of min-max A.  Multiple cues for pt to turn head and align her head. Pt then engaged in UB self care with mod A bathing, max dressing.  Transferred to chair and taken to gym to work on sit to stands at parallel bars with facilitation to stabilize L knee, L foot placement, upright posture with max A +2. Pt tolerated upright position for at least 30 seconds 2x. Pt adjusted in w/c with quick release belt and pt's spouse took her back to her room.    Therapy Documentation Precautions:  Precautions Precautions: Fall Precaution Comments: Lt neglect , poor trunk control, decreased awareness Restrictions Weight Bearing Restrictions: No   Pain: Pain Assessment Pain Assessment: No/denies pain ADL:  See Function Navigator for Current Functional Status.   Therapy/Group: Individual Therapy  Rashunda Passon 07/15/2015, 12:42 PM

## 2015-07-15 NOTE — Progress Notes (Signed)
Physical Therapy Weekly Progress Note  Patient Details  Name: Becky Patterson MRN: [id number] Date of Birth: 08-07-1955  Beginning of progress report period: July 08, 2015 End of progress report period: July 15, 2015  Patient has met 0 of 3 short term goals.  Pt continues to need +2 assist for transfers and max/total A for balance due to decreased proprioception and balance reactions. Pt still with difficulty with sustained attention to task during therapy treatment.  Patient continues to demonstrate the following deficits: Lt inattention, Lt sided weakness, impaired balance, impaired proprioception, impaired cognition and therefore will continue to benefit from skilled PT intervention to enhance overall performance with activity tolerance, balance, postural control, ability to compensate for deficits, functional use of  left upper extremity and left lower extremity, attention and awareness.  Patient progressing toward long term goals..  Continue plan of care.  PT Short Term Goals Week 1:  PT Short Term Goal 1 (Week 1): pt will maintain static sitting balance with min A PT Short Term Goal 1 - Progress (Week 1): Not met PT Short Term Goal 2 (Week 1): Pt will perform functional transfers with max A PT Short Term Goal 2 - Progress (Week 1): Not met PT Short Term Goal 3 (Week 1): pt will attend to therapeutic task x 1 minute with min cuing PT Short Term Goal 3 - Progress (Week 1): Not met Week 2:  PT Short Term Goal 1 (Week 2): pt will maintain static sitting balance with min A PT Short Term Goal 2 (Week 2): pt will perform functional transfers with max A PT Short Term Goal 3 (Week 2): pt will attend to therapeutic task x 1 minute with min cuing  Skilled Therapeutic Interventions/Progress Updates:  Ambulation/gait training;Discharge planning;Functional mobility training;Therapeutic Activities;Therapeutic Exercise;Wheelchair propulsion/positioning;Neuromuscular  re-education;Balance/vestibular training;Cognitive remediation/compensation;Community reintegration;Functional electrical stimulation;Patient/family education;Stair training;UE/LE Coordination activities;UE/LE Strength taining/ROM;Splinting/orthotics;Pain management;DME/adaptive equipment instruction    See Function Navigator for Current Functional Status.   DONAWERTH,KAREN 07/15/2015, 7:27 AM

## 2015-07-16 ENCOUNTER — Inpatient Hospital Stay (HOSPITAL_COMMUNITY): Payer: 59 | Admitting: Speech Pathology

## 2015-07-16 ENCOUNTER — Inpatient Hospital Stay (HOSPITAL_COMMUNITY): Payer: 59 | Admitting: Occupational Therapy

## 2015-07-16 ENCOUNTER — Inpatient Hospital Stay (HOSPITAL_COMMUNITY): Payer: 59 | Admitting: Physical Therapy

## 2015-07-16 LAB — GLUCOSE, CAPILLARY
GLUCOSE-CAPILLARY: 122 mg/dL — AB (ref 65–99)
Glucose-Capillary: 130 mg/dL — ABNORMAL HIGH (ref 65–99)
Glucose-Capillary: 111 mg/dL — ABNORMAL HIGH (ref 65–99)
Glucose-Capillary: 147 mg/dL — ABNORMAL HIGH (ref 65–99)

## 2015-07-16 MED ORDER — NICOTINE 21 MG/24HR TD PT24
21.0000 mg | MEDICATED_PATCH | Freq: Every day | TRANSDERMAL | Status: DC
  Administered 2015-07-16 – 2015-07-30 (×15): 21 mg via TRANSDERMAL
  Filled 2015-07-16 (×15): qty 1

## 2015-07-16 MED ORDER — PRO-STAT SUGAR FREE PO LIQD
30.0000 mL | Freq: Two times a day (BID) | ORAL | Status: DC
  Administered 2015-07-16 – 2015-07-30 (×28): 30 mL via ORAL
  Filled 2015-07-16 (×28): qty 30

## 2015-07-16 MED ORDER — METHYLPHENIDATE HCL 5 MG PO TABS
5.0000 mg | ORAL_TABLET | Freq: Two times a day (BID) | ORAL | Status: DC
  Administered 2015-07-16: 5 mg via ORAL
  Filled 2015-07-16: qty 1

## 2015-07-16 MED ORDER — CHLORHEXIDINE GLUCONATE 0.12 % MT SOLN
OROMUCOSAL | Status: AC
Start: 2015-07-16 — End: 2015-07-17
  Filled 2015-07-16: qty 15

## 2015-07-16 NOTE — Progress Notes (Signed)
Subjective/Complaints: Getting ready for transfer with PT, had 100% intake for dinner yest, still hooked up to IVF ROS- No SOB/CP No abd pain, - N/V, no blood in stool,   Objective: Vital Signs: Blood pressure 117/66, pulse 96, temperature 99.1 F (37.3 C), temperature source Oral, resp. rate 18, height '5\' 1"'  (1.549 m), weight 82.691 kg (182 lb 4.8 oz), SpO2 97 %. Dg Swallowing Func-speech Pathology  07/15/2015  Objective Swallowing Evaluation: Type of Study: MBS-Modified Barium Swallow Study Patient Details Name: Becky Patterson MRN: [id number] Date of Birth: [birth year] Today's Date: 07/15/2015 Time: SLP Start Time (ACUTE ONLY): 1340 SLP Stop Time (ACUTE ONLY): 1410 SLP Time Calculation (min) (ACUTE ONLY): 30 min Past Medical History: Past Medical History Diagnosis Date . Lung mass  . Bacterial endocarditis  . Splenic infarction  . Epistaxis  Past Surgical History: Past Surgical History Procedure Laterality Date . Back surgery   . Tee without cardioversion N/A 06/24/2015   Procedure: TRANSESOPHAGEAL ECHOCARDIOGRAM (TEE);  Surgeon: Becky Dresser, MD;  Location: [facility];  Service: Cardiovascular;  Laterality: N/A; . Radiology with anesthesia N/A 06/29/2015   Procedure: RADIOLOGY WITH ANESTHESIA;  Surgeon: Becky Bras, MD;  Location: [facility];  Service: Radiology;  Laterality: N/A; . Cholecystectomy   HPI: Becky Patterson is a 60 y.o. female history of infective endocarditis that was treated with discharge on 2/2, continuing on antibiotics at home, lung mass noted on that admission. Re-admitted with left sided weakness that progressed after admission. 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  Pt admitted to CIR on 07/07/15.  Tolerating Dys 3, honey thick liquids diet.  Repeat MBS ordered to objectively determine readiness to advance given improvements noted at bedside.  No Data Recorded Assessment / Plan / Recommendation CHL IP CLINICAL IMPRESSIONS 07/15/2015 Therapy Diagnosis  Mild pharyngeal phase dysphagia;Mild oral phase dysphagia Clinical Impression Pt presents with improved swallowing function and now presents with a mild oropharyngeal dysphagia with sensory>motor deficits.  Pt presents with left sided labial and lingual weakness which results in anterior loss of boluses, impaired mastication of solids, and prolonged posterior transit of boluses to the oropharynx.  Decreased pharyngeal sensation and base of tongue weakness resulted in poor bolus cohesion and premature spillage of materials into the pharynx with swallow response triggered at the vallecula with thickened liquids and solids, delayed to the pyriforms with thin liquids.  Delay in swallow initiation resulted in penetration of large boluses of nectar thick liquids and thin liquids.  Penetration of thin liquids was greater in amount and occurred deeper into the laryngeal vestibule, requiring increased cues for throat clear and extra swallows to clear.  Chin tuck ineffective largely due to cognitive deficits impacting pt's ability to successfully time strategy during the swallow.   Given pt's significant cognitive deficits, limited mobility, and persistent lethargy, recommend conservative advancement to Dys 3, nectar thick liquids with full supervision for use of swallowing precautions.  Pt would also benefit from implementation of the water protocol to continue working towards liquids advancement.  Prognosis for advancement good given progress made thus far with continued ST interventions for trials of advanced consistencies and use of compensatory swallowing strategies.   Impact on safety and function Mild aspiration risk     Prognosis 07/03/2015 Prognosis for Safe Diet Advancement Good Barriers to Reach Goals -- Barriers/Prognosis Comment -- CHL IP DIET RECOMMENDATION 07/15/2015 SLP Diet Recommendations Dysphagia 3 (Mech soft) solids;Nectar thick liquid Liquid Administration via Cup;No straw Medication Administration Crushed  with puree  Compensations Slow rate;Small sips/bites;Lingual sweep for clearance of pocketing Postural Changes Seated upright at 90 degrees            CHL IP ORAL PHASE 07/15/2015 Oral Phase Impaired Oral - Pudding Teaspoon -- Oral - Pudding Cup -- Oral - Honey Teaspoon -- Oral - Honey Cup Left anterior bolus loss;Delayed oral transit;Decreased bolus cohesion;Premature spillage;Lingual/palatal residue Oral - Nectar Teaspoon -- Oral - Nectar Cup Left anterior bolus loss;Delayed oral transit;Lingual/palatal residue;Decreased bolus cohesion;Premature spillage Oral - Nectar Straw Lingual/palatal residue;Decreased bolus cohesion;Premature spillage Oral - Thin Teaspoon -- Oral - Thin Cup Left anterior bolus loss;Lingual/palatal residue;Delayed oral transit;Decreased bolus cohesion;Premature spillage Oral - Thin Straw Lingual/palatal residue;Decreased bolus cohesion;Premature spillage Oral - Puree Lingual/palatal residue;Delayed oral transit;Premature spillage Oral - Mech Soft Lingual/palatal residue;Premature spillage;Delayed oral transit;Impaired mastication Oral - Regular -- Oral - Multi-Consistency -- Oral - Pill -- Oral Phase - Comment --  CHL IP PHARYNGEAL PHASE 07/15/2015 Pharyngeal Phase Impaired Pharyngeal- Pudding Teaspoon -- Pharyngeal -- Pharyngeal- Pudding Cup -- Pharyngeal -- Pharyngeal- Honey Teaspoon -- Pharyngeal -- Pharyngeal- Honey Cup Delayed swallow initiation-vallecula;Reduced tongue base retraction;Pharyngeal residue - valleculae Pharyngeal -- Pharyngeal- Nectar Teaspoon -- Pharyngeal -- Pharyngeal- Nectar Cup Delayed swallow initiation-vallecula;Reduced tongue base retraction;Pharyngeal residue - valleculae;Penetration/Aspiration during swallow Pharyngeal Material enters airway, remains ABOVE vocal cords then ejected out Pharyngeal- Nectar Straw -- Pharyngeal -- Pharyngeal- Thin Teaspoon -- Pharyngeal -- Pharyngeal- Thin Cup Delayed swallow initiation-pyriform sinuses;Reduced tongue base  retraction;Penetration/Aspiration during swallow;Pharyngeal residue - valleculae;Pharyngeal residue - pyriform Pharyngeal Material enters airway, remains ABOVE vocal cords and not ejected out Pharyngeal- Thin Straw Delayed swallow initiation-pyriform sinuses;Reduced tongue base retraction;Penetration/Aspiration during swallow;Pharyngeal residue - valleculae;Pharyngeal residue - pyriform Pharyngeal Material enters airway, remains ABOVE vocal cords and not ejected out Pharyngeal- Puree Delayed swallow initiation-vallecula;Reduced tongue base retraction Pharyngeal -- Pharyngeal- Mechanical Soft Delayed swallow initiation-vallecula;Reduced tongue base retraction Pharyngeal -- Pharyngeal- Regular -- Pharyngeal -- Pharyngeal- Multi-consistency -- Pharyngeal -- Pharyngeal- Pill -- Pharyngeal -- Pharyngeal Comment --  CHL IP CERVICAL ESOPHAGEAL PHASE 07/03/2015 Cervical Esophageal Phase WFL Pudding Teaspoon -- Pudding Cup -- Honey Teaspoon -- Honey Cup -- Nectar Teaspoon -- Nectar Cup -- Nectar Straw -- Thin Teaspoon -- Thin Cup -- Thin Straw -- Puree -- Mechanical Soft -- Regular -- Multi-consistency -- Pill -- Cervical Esophageal Comment -- No flowsheet data found. Page, Selinda Orion 07/15/2015, 4:17 PM              Results for orders placed or performed during the hospital encounter of 07/07/15 (from the past 72 hour(s))  Glucose, capillary     Status: Abnormal   Collection Time: 07/13/15 11:38 AM  Result Value Ref Range   Glucose-Capillary 107 (H) 65 - 99 mg/dL  Glucose, capillary     Status: Abnormal   Collection Time: 07/13/15  4:45 PM  Result Value Ref Range   Glucose-Capillary 118 (H) 65 - 99 mg/dL  Glucose, capillary     Status: Abnormal   Collection Time: 07/13/15  8:57 PM  Result Value Ref Range   Glucose-Capillary 152 (H) 65 - 99 mg/dL  Creatinine, serum     Status: None   Collection Time: 07/14/15  3:15 AM  Result Value Ref Range   Creatinine, Ser 0.85 0.44 - 1.00 mg/dL   GFR calc non Af Amer >60  >60 mL/min   GFR calc Af Amer >60 >60 mL/min    Comment: (NOTE) The eGFR has been calculated using the CKD EPI equation. This calculation has not been validated in  all clinical situations. eGFR's persistently <60 mL/min signify possible Chronic Kidney Disease.   CBC     Status: Abnormal   Collection Time: 07/14/15  3:15 AM  Result Value Ref Range   WBC 8.9 4.0 - 10.5 K/uL   RBC 2.77 (L) 3.87 - 5.11 MIL/uL   Hemoglobin 8.1 (L) 12.0 - 15.0 g/dL   HCT 26.1 (L) 36.0 - 46.0 %   MCV 94.2 78.0 - 100.0 fL   MCH 29.2 26.0 - 34.0 pg   MCHC 31.0 30.0 - 36.0 g/dL   RDW 16.0 (H) 11.5 - 15.5 %   Platelets 403 (H) 150 - 400 K/uL  Glucose, capillary     Status: Abnormal   Collection Time: 07/14/15  6:46 AM  Result Value Ref Range   Glucose-Capillary 109 (H) 65 - 99 mg/dL  Glucose, capillary     Status: Abnormal   Collection Time: 07/14/15 11:54 AM  Result Value Ref Range   Glucose-Capillary 112 (H) 65 - 99 mg/dL   Comment 1 Notify RN   Glucose, capillary     Status: Abnormal   Collection Time: 07/14/15  4:43 PM  Result Value Ref Range   Glucose-Capillary 117 (H) 65 - 99 mg/dL   Comment 1 Notify RN   Glucose, capillary     Status: Abnormal   Collection Time: 07/14/15  9:41 PM  Result Value Ref Range   Glucose-Capillary 126 (H) 65 - 99 mg/dL  Glucose, capillary     Status: Abnormal   Collection Time: 07/15/15  6:34 AM  Result Value Ref Range   Glucose-Capillary 105 (H) 65 - 99 mg/dL  Glucose, capillary     Status: Abnormal   Collection Time: 07/15/15 12:18 PM  Result Value Ref Range   Glucose-Capillary 101 (H) 65 - 99 mg/dL   Comment 1 Notify RN   Glucose, capillary     Status: Abnormal   Collection Time: 07/15/15  4:50 PM  Result Value Ref Range   Glucose-Capillary 131 (H) 65 - 99 mg/dL  Glucose, capillary     Status: Abnormal   Collection Time: 07/15/15  8:44 PM  Result Value Ref Range   Glucose-Capillary 135 (H) 65 - 99 mg/dL  Glucose, capillary     Status: Abnormal    Collection Time: 07/16/15  7:23 AM  Result Value Ref Range   Glucose-Capillary 130 (H) 65 - 99 mg/dL     HEENT: normal Cardio: RRR and no murmur Resp: CTA B/L and unlabored GI: BS positive and NT, ND Extremity:  No Edema Skin:   Intact and Other no heel breakdown, no evidence of rash Neuro: Lethargic, Confused, Flat, Cranial Nerve Abnormalities Left CN central 7, Abnormal Sensory reduced pinch sensation in LUE and LLE, Abnormal Motor 0/5 LUE and LLE, Tone:  clonus in Left ankle, Dysarthric and Inattention, left field cut Musc/Skel:  Other no pain with LUE or LLE ROM, tenderness over the R trap. Left heel cord tight as well as finger flexors Gen NAD   Assessment/Plan: 1. Functional deficits secondary to Right MCA infarct with Left HP, Left neglect, Left hemisensory deficits which require 3+ hours per day of interdisciplinary therapy in a comprehensive inpatient rehab setting. Physiatrist is providing close team supervision and 24 hour management of active medical problems listed below. Physiatrist and rehab team continue to assess barriers to discharge/monitor patient progress toward functional and medical goals. FIM: Function - Bathing Position: Bed Body parts bathed by patient: Right upper leg, Front perineal area, Right lower leg,  Left upper leg, Left arm, Chest, Abdomen Body parts bathed by helper: Back, Left lower leg, Buttocks, Right arm Bathing not applicable: Right arm, Left arm, Chest, Abdomen (pt connected to PICC line, could not change shirt at the time) Assist Level: 2 helpers  Function- Upper Body Dressing/Undressing What is the patient wearing?: Pull over shirt/dress Pull over shirt/dress - Perfomed by patient: Thread/unthread right sleeve Pull over shirt/dress - Perfomed by helper: Put head through opening, Thread/unthread left sleeve, Pull shirt over trunk Assist Level: Touching or steadying assistance(Pt > 75%) (modA) Function - Lower Body Dressing/Undressing What is  the patient wearing?: Pants, Socks, Shoes Position: Bed Pants- Performed by patient: Thread/unthread right pants leg Pants- Performed by helper: Thread/unthread left pants leg, Pull pants up/down Non-skid slipper socks- Performed by helper: Don/doff right sock, Don/doff left sock Socks - Performed by helper: Don/doff right sock, Don/doff left sock Shoes - Performed by patient: Don/doff right shoe Shoes - Performed by helper: Don/doff right shoe, Don/doff left shoe, Fasten right, Fasten left Assist for lower body dressing: 2 Helpers  Function - Psychologist, occupational activity did not occur: No continent bowel/bladder event Assist level: Two helpers  Function - Air cabin crew transfer activity did not occur: Safety/medical concerns Assist level to toilet: 2 helpers Assist level from toilet: 2 helpers  Function - Chair/bed transfer Chair/bed transfer method: Squat pivot Chair/bed transfer assist level: 2 helpers Chair/bed transfer assistive device: Armrests Mechanical lift: Maximove Chair/bed transfer details: Verbal cues for sequencing, Verbal cues for technique, Manual facilitation for weight shifting, Manual facilitation for placement, Manual facilitation for weight bearing  Function - Locomotion: Wheelchair Will patient use wheelchair at discharge?: Yes Type: Manual (TIS) Max wheelchair distance: 150 Assist Level: Dependent (Pt equals 0%) Assist Level: Dependent (Pt equals 0%) Assist Level: Dependent (Pt equals 0%) Function - Locomotion: Ambulation Ambulation activity did not occur: Safety/medical concerns  Function - Comprehension Comprehension: Auditory Comprehension assist level: Understands basic 50 - 74% of the time/ requires cueing 25 - 49% of the time  Function - Expression Expression: Verbal Expression assist level: Expresses basic 50 - 74% of the time/requires cueing 25 - 49% of the time. Needs to repeat parts of sentences.  Function - Social  Interaction Social Interaction assist level: Interacts appropriately 50 - 74% of the time - May be physically or verbally inappropriate.  Function - Problem Solving Problem solving assist level: Solves basic 50 - 74% of the time/requires cueing 25 - 49% of the time  Function - Memory Memory assist level: Recognizes or recalls 50 - 74% of the time/requires cueing 25 - 49% of the time Patient normally able to recall (first 3 days only): Current season  Medical Problem List and Plan: 1.  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 No antithrombotic needed.Cont Abx thru 3/7  -Team conference today please see physician documentation under team conference tab, met with team face-to-face to discuss problems,progress, and goals. Formulized individual treatment plan based on medical history, underlying problem and comorbidities. 2.  DVT Prophylaxis/Anticoagulation: Pharmaceutical: Lovenox 3. Pain Management: Has chronic back pain per family. Will use tylenol prn for now  -topamax for postroke headache  -tramadol in place of  Percocet due to  itching  -overall improvement in sleep/headache 4. Mood: LCSW to follow for evaluation and support.   5. Neuropsych: This patient is not fully capable of making decisions on her own behalf. 6. Skin/Wound Care: routine pressure relief measures.   7. Fluids/Electrolytes/Nutrition: Monitor  I/O.upgraded to D3 nectar, will cont Megace, D/C topamax dose    Getting dehydrated with honey liquids.   IVF at nights and need to encourage fluid intake during the day. Will d/c and monitor now that pt has upgraded diet 8. 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 PET scan on outpatient basis. 10. Hypokalemia: K+ 3.4 2/14 will recheck in am increase KCL supplement,  11. Cytotoxic edema: Weaned of hypertonic solution.    12.  History of Maculopapular rash:  Likely due to antibiotics per ID and itching being managed with prn benadryl. Antibiotics narrowed to Ceftriaxone on 02/11--monitor for now. IF rash recurs will need to change back to imipenem.   Antibiotic-End date March 7th.  Itching improved off of percocet, will list as intolerance  13. Anemia: Hgb low but has stayed in 7.6-8.0 range, check stool guaic was pos 1/23, neg on 2/2 and 2/16, Hgb stable at 8.1 2/20  -follow up labs monday 14. Hypotension: Cont to monitor 15. Headache: topamax 65m BID, Tramadol [id number] prn, improved,d/c topamax  LOS (Days) 9 A FACE TO FACE EVALUATION WAS PERFORMED  Donnalee Cellucci E 07/16/2015, 8:13 AM

## 2015-07-16 NOTE — Patient Care Conference (Signed)
Inpatient RehabilitationTeam Conference and Plan of Care Update Date: 07/16/2015   Time: 10:25 AM    Patient Name: Becky Patterson      Medical Record Number: [id number]  Date of Birth: Jun 04, 1955 Sex: Female         Room/Bed: 4W18C/4W18C-01 Payor Info: Payor: [employer] / Plan: [employer] OTHER / Product Type: *No Product type* /    Admitting Diagnosis: RT CVA  Admit Date/Time:  07/07/2015  3:54 PM Admission Comments: No comment available   Primary Diagnosis:  <principal problem not specified> Principal Problem: <principal problem not specified>  Patient Active Problem List   Diagnosis Date Noted  . Acute right MCA stroke ([place]) 07/07/2015  . Intracranial septic embolism ([place])   . Hemiplegia affecting left nondominant side ([place])   . Dysphagia   . Alteration of sensation as late effect of stroke   . Dehydration   . Bacterial endocarditis   . Maculopapular rash   . Acute blood loss anemia   . Other vascular headache   . Other complicated headache syndrome   . 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 Left hemiparesis ([place]) 07/03/2015  . 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 Visual field cut 07/03/2015  . Palliative care encounter   . DNR (do not resuscitate) discussion   . Acute respiratory failure with hypoxia ([place]) 06/30/2015  . Arterial hypotension 06/30/2015  . Acute encephalopathy   . Stroke due to embolism ([place])   . Stroke (cerebrum) ([place]) 06/29/2015  . Colitis   . Pyelonephritis   . Enterococcal bacteremia 06/19/2015  . Sepsis secondary to UTI ([place])   . Mass of lower lobe of left lung   . Tobacco abuse   . Anemia of chronic disease   . Hyponatremia   . Hyperglycemia   . Hypokalemia   . Lung mass 06/15/2015    Expected Discharge Date: Expected Discharge Date: 08/05/15  Team Members Present: Physician leading conference: Dr. Alysia Penna Social Worker Present: Ovidio Kin, LCSW Nurse Present: Heather Roberts, RN PT Present: Raylene Everts,  PT OT Present: Clyda Greener, OT SLP Present: Windell Moulding, SLP PPS Coordinator present : Daiva Nakayama, RN, CRRN     Current Status/Progress Goal Weekly Team Focus  Medical   poor initiation, improved appetite, HA improved  home with 24/7 care  improve attention, initiation, ritalin trial   Bowel/Bladder   Occasional incontinent episodes of both bowel and bladder. LBM 07/14/15  Timed toileting q 3 hours and after meals  Encourage pt to be continent with timed toileting    Swallow/Nutrition/ Hydration   Advanced to dys 3, nectar thick liquids per MBS; water protocol, continues to need full supervision due to cognition   supervision with least restrictive diet   therapeutic trials of ice chips, toleration of nectar thick liquids    ADL's   pt continues to need mod-max UB selfcare and total A for LB (can be +1 if supine in bed). No change in LUE. Pt is progressing with L side awareness, postural control in both sitting and standing. Sitting balance mod A with max cues.  min to mod assist for most selfcare tasks  ADL retraining, functional mobility, balance retraining, pt/family education   Mobility   total A transfers, max A sitting balance  min A  balance, attention, transfers   Communication             Safety/Cognition/ Behavioral Observations  Mod-max assist for basic tasks   min assist for basic   continue  to address left inattention of deficits, impulsivity, initiation   Pain   Occasional c/o headache. Ultram Q6 prn.   Pain <4  Assess pain q shift and prn    Skin   Bottom pink but blanchable. Foam to sacrum.   Turn q 2 hrs, and elevate heels  continue to monitor and assess skin q shift annd prn       *See Care Plan and progress notes for long and short-term goals.  Barriers to Discharge: total assist    Possible Resolutions to Barriers:  long LOS instruct family on lift device     Discharge Planning/Teaching Needs:  Family is here daily attending therapies with pt, plna to  take her home although she will be much care.      Team Discussion:  Slowly making progress-still will be much care at discharge-mod/max level of assist. Eating better-on megace and diet advanced to nectar along with water protocol started. May start ritalin trial for alertness and awareness. Better left side attention. Timed toileting working, still pushing.   Revisions to Treatment Plan:  None   Continued Need for Acute Rehabilitation Level of Care: The patient requires daily medical management by a physician with specialized training in physical medicine and rehabilitation for the following conditions: Daily medical management of patient stability for increased activity during participation in an intensive rehabilitation regime.: Yes Daily analysis of laboratory values and/or radiology reports with any subsequent need for medication adjustment of medical intervention for : Mood/behavior problems  Vollie Brunty, Gardiner Rhyme 07/16/2015, 4:09 PM

## 2015-07-16 NOTE — Progress Notes (Signed)
Speech Language Pathology Weekly Progress and Session Note  Patient Details  Name: Becky Patterson MRN: [id number] Date of Birth: August 10, 1955  Beginning of progress report period: July 09, 2015  End of progress report period: July 16, 2015   Today's Date: 07/16/2015 SLP Individual Time: 1132-1207; 1445-1530 SLP Individual Time Calculation (min): 35 min; 45 min  Short Term Goals: Week 1: SLP Short Term Goal 1 (Week 1): Pt will attend to the left in >50% of observable opportunities with max assist verbal cues.   SLP Short Term Goal 1 - Progress (Week 1): Met SLP Short Term Goal 2 (Week 1): Pt will sustain her attention to basic familiar tasks for 3-5 minute intervals with mod assist verbal cues for redirection.  SLP Short Term Goal 2 - Progress (Week 1): Progressing toward goal SLP Short Term Goal 3 (Week 1): Pt will consume her currently prescribed diet with min verbal cues for use of swallowing precautions.   SLP Short Term Goal 3 - Progress (Week 1): Progressing toward goal SLP Short Term Goal 4 (Week 1): Pt will consume therapeutic trials of ice chips with minimal overt s/s of aspiration to determine readiness for repeat objective study.  SLP Short Term Goal 4 - Progress (Week 1): Met SLP Short Term Goal 5 (Week 1): Pt will identify at least 2 deficits occurring s/p CVA with mod question cues.   SLP Short Term Goal 5 - Progress (Week 1): Progressing toward goal    New Short Term Goals: Week 2: SLP Short Term Goal 1 (Week 2): Pt will attend to the left in >75% of observable opportunities with max assist verbal cues.   SLP Short Term Goal 2 (Week 2): Pt will sustain her attention to basic familiar tasks for 3-5 minute intervals with mod assist verbal cues for redirection.  SLP Short Term Goal 3 (Week 2): Pt will consume dys 3 textures and nectar thick liquids with min verbal cues for use of swallowing precautions.   SLP Short Term Goal 4 (Week 2): Pt willl consume trials of water  via cup sips as part of the water protocol with minimal overt s/s of aspiration for 3 consecutive sessions with min verbal cues for use of swallowing precautions.  SLP Short Term Goal 5 (Week 2): Pt will identify at least 2 deficits occurring s/p CVA with mod question cues.    Weekly Progress Updates:  Pt made slow functional gains and is discharging having met 2 out of 5 short term goals.  Pt's diet has been advanced to dys 3 textures, nectar thick liquids and pt is now on the water protocol to continue working towards liquids progression.  Pt currently requires mod-max assist cues for use of swallowing precautions due to cognitive deficits.  Pt required max assist during basic tasks due to decreased initiation, decreased sustained attention to task, decreased intellectual awareness of deficits, and poor safety awareness with impulsivity.  Pt would continue to benefit from skilled ST while inpatient in order to maximize functional independence and reduce burden of care prior to discharge.  Continue to recommend 24/7 supervision at discharge in addition to [place] follow up at next level of care.  Pt and family education is ongoing.     Intensity: Minumum of 1-2 x/day, 30 to 90 minutes Frequency: 3 to 5 out of 7 days Duration/Length of Stay: 21-28 days  Treatment/Interventions: Cognitive remediation/compensation;Cueing hierarchy;Functional tasks;Environmental controls;Dysphagia/aspiration precaution training;Internal/external aids;Patient/family education   Daily Session  Skilled Therapeutic Interventions:  Session 1: Pt  was seen for skilled ST targeting dysphagia goals.  SLP completed skilled observations during trials of Dys 3 textures and nectar thick liquids.  Pt demonstrated intermittently wet vocal quality following large boluses of solids, which was mitigated with Mod verbal and tactile cues for rate and portion control.  Soft throat clearing noted intermittently following liquids which SLP  suspects to be related to pharyngeal residuals given results of MBS yesterday.  [name] reported good toleration of breakfast meal today without overt s/s of aspiration.  Pt remains afebrile with no significant changes in O2 sats or lung sounds documented per chart review, therefore, recommend that pt remain on Dys 3, nectar thick liquids with the water protcol.   Pt benefited from max assist verbal and tactile cues for initiation of self feeding and to locate utensils and cup when placed on left side of tray.  Pt left in wheelchair with call bell within reach, quick release belt donned for safety and family at bedside.    Session 2:  Pt was seen for skilled ST targeting goals for dysphagia and cognition.  SLP facilitated the session with trials of water following thorough oral care per the water protocol.  Pt required mod-max assist multimodal cues for rate and portion control but demonstrated no overt s/s of aspiration with liquids.  SLP also facilitated the session with a basic sequencing task targeting sustained attention and visual scanning to the left of midline.  Pt required max assist verbal cues for initiation to organize 4 step pictures cards in sequential order, max assist verbal and tactile cues and the use of a visual anchor to identify pictures to the left of midline.  Pt able to sustain her attention to task for ~30 seconds with max assist multimodal cues for redirect.  Pt left in bed with husband at bedside, bed alarm set, and call bell left within reach.  Goals updated on this date to reflect current progress and plan of care.     Function:   Eating Eating   Modified Consistency Diet: Yes Eating Assist Level: Supervision or verbal cues   Eating Set Up Assist For: Opening containers       Cognition Comprehension Comprehension assist level: Understands basic 75 - 89% of the time/ requires cueing 10 - 24% of the time  Expression   Expression assist level: Expresses basic 75 - 89%  of the time/requires cueing 10 - 24% of the time. Needs helper to occlude trach/needs to repeat words.  Social Interaction Social Interaction assist level: Interacts appropriately 50 - 74% of the time - May be physically or verbally inappropriate.  Problem Solving Problem solving assist level: Solves basic 25 - 49% of the time - needs direction more than half the time to initiate, plan or complete simple activities  Memory Memory assist level: Recognizes or recalls 25 - 49% of the time/requires cueing 50 - 75% of the time   General    Pain Pain Assessment Pain Assessment: No/denies pain  Therapy/Group: Individual Therapy  Kimie Pidcock, Selinda Orion 07/16/2015, 4:32 PM

## 2015-07-16 NOTE — Progress Notes (Signed)
Physical Therapy Session Note  Patient Details  Name: Becky Patterson MRN: [id number] Date of Birth: 1955/06/27  Today's Date: 07/16/2015 PT Individual Time: 0800-0900 PT Individual Time Calculation (min): 60 min   Short Term Goals: Week 2:  PT Short Term Goal 1 (Week 2): pt will maintain static sitting balance with min A PT Short Term Goal 2 (Week 2): pt will perform functional transfers with max A PT Short Term Goal 3 (Week 2): pt will attend to therapeutic task x 1 minute with min cuing  Skilled Therapeutic Interventions/Progress Updates:   Patient in bed with husband present for session. Performed rolling R and L using bed rails with mod A overall and cues for technique to don pants total A. Transferred supine > sit with max A and max multimodal cues for sequencing, static sitting balance edge of mat with max A due to pushing tendencies to L progressed to supervision with RUE on foot board. Performed squat pivot transfer to R with +2A and cues to reach for far arm rest with RUE. Transitioned to day room where patient consumed breakfast meal seated in TIS wheelchair with supervision and max multimodal cues for attention to L and cues to adhere to swallowing precautions. Performed sit <> stand from wheelchair via [place] with +2A and patient able to maintain standing balance with PT blocking LLE to prevent buckling, patient's husband supporting flaccid LUE, and rehab tech providing HHA to outside BOS to R to promote weight shift and reaching to R to decrease pushing to L, cues for midline orientation. Patient returned to wheelchair and requesting to toilet, returned to room with husband and NT notified of need for toileting.   Therapy Documentation Precautions:  Precautions Precautions: Fall Precaution Comments: Lt neglect , poor trunk control, decreased awareness Restrictions Weight Bearing Restrictions: No Pain: Pain Assessment Pain Assessment: No/denies pain   See Function Navigator  for Current Functional Status.   Therapy/Group: Individual Therapy  Laretta Alstrom 07/16/2015, 12:35 PM

## 2015-07-16 NOTE — Progress Notes (Signed)
Occupational Therapy Session Note  Patient Details  Name: Becky Patterson MRN: [id number] Date of Birth: [birth year]  Today's Date: 07/16/2015 OT Individual Time: 1700-1749 OT Individual Time Calculation (min): 60 min    Short Term Goals: Week 1:  OT Short Term Goal 1 (Week 1): Pt will maintain static sitting balance EOB for 2 mins with close supervision in preparation for selfcare tasks.  OT Short Term Goal 1 - Progress (Week 1): Not met OT Short Term Goal 2 (Week 1): Pt will complete UB bathing and dressing sitting supported with min assist. OT Short Term Goal 2 - Progress (Week 1): Not met OT Short Term Goal 3 (Week 1): Pt will transfer squat pivot to the drop arm commode with max assist of 1 person.  OT Short Term Goal 3 - Progress (Week 1): Not met OT Short Term Goal 4 (Week 1): Pt will locate all grooming and bathing items placed left of midline with no more than mod instructional cueing. OT Short Term Goal 4 - Progress (Week 1): Not met OT Short Term Goal 5 (Week 1): Pt's spouse will return demonstrate safe assist with LUE PROM exercises following handout.   OT Short Term Goal 5 - Progress (Week 1): Met Week 2:  OT Short Term Goal 1 (Week 2): Pt will maintain static sitting balance EOB for 2 mins with close supervision in preparation for selfcare tasks.  OT Short Term Goal 2 (Week 2): Pt will complete UB bathing and dressing sitting supported with min assist. OT Short Term Goal 3 (Week 2): Pt will transfer squat pivot to the drop arm commode with max assist of 1 person.  OT Short Term Goal 4 (Week 2): Pt will locate all grooming and bathing items placed left of midline with no more than mod instructional cueing. OT Short Term Goal 5 (Week 2): Pt will use the LUE during bathing sessions with max assist.        Skilled Therapeutic Interventions/Progress Updates:    Pt seen for skilled OT to facilitate sitting balance, postural control and left side awareness. Pt received in bed  stating she opted not to bathe today and pt was already dressed.  Pt worked on bed mobility and sitting balance with max A and max cues to achieve midline. Worked from EOB for 10 min then transferred to w/c for oral care at sink so pt could engage in water protocol.  Pt taken to gym and transferred to mat. Worked for 30 min on just maintaining midline with head turns to the left, slight reaching to the R with guidance. Wedge placed under L hip to improve pelvic symmetry.  Pt was able to maintain static sitting for brief (10-20sec) periods.  Max cues to turn head to left to find objects. Pt returned to chair and room with quick release belt on. Spouse with pt in the room.  Therapy Documentation Precautions:  Precautions Precautions: Fall Precaution Comments: Lt neglect , poor trunk control, decreased awareness Restrictions Weight Bearing Restrictions: No    Pain: Pain Assessment Pain Assessment: No/denies pain ADL:  See Function Navigator for Current Functional Status.   Therapy/Group: Individual Therapy  [place] 07/16/2015, 12:14 PM

## 2015-07-16 NOTE — Progress Notes (Signed)
Nutrition Follow-up  DOCUMENTATION CODES:   Obesity unspecified  INTERVENTION:  Discontinue Ensure.  Provide 30 ml Prostat po BID, each supplement provides 100 kcal and 15 grams of protein.   Provide Magic cup BID with meals, each supplement provides 290 kcal and 9 grams of protein  Encourage adequate PO intake.   NUTRITION DIAGNOSIS:   Inadequate oral intake related to lethargy/confusion, dysphagia as evidenced by  (varied meal completion 5-85%); ongoing  GOAL:   Patient will meet greater than or equal to 90% of their needs; progressing  MONITOR:   PO intake, Supplement acceptance, Weight trends, Labs, I & O's, Diet advancement, Skin  REASON FOR ASSESSMENT:   Low Braden    ASSESSMENT:   60 y.o. 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 She was admitted on 06/29/15 with acute onset of left sided weakness with slurred speech and left neglect.   Meal completion has been 10-100% with intake of 60% this AM. Family at bedside reports intake has been improving. Pt has Ensure ordered however has been refusing them. RD to discontinue and order Prostat to aid in protein needs as pt is now on a nectar thick fluid consistency. Pt additionally requests orange Magic cup between meals. Nursing staff to provide. Pt was encouraged to eat her food at meals and to take her supplements.   Diet Order:  DIET DYS 3 Room service appropriate?: Yes; Fluid consistency:: Nectar Thick  Skin:  Wound (see comment) (Stage I on buttocks)  Last BM:  2/21  Height:   Ht Readings from Last 1 Encounters:  07/07/15 '5\' 1"'$  (1.549 m)    Weight:   Wt Readings from Last 1 Encounters:  07/16/15 182 lb 4.8 oz (82.691 kg)    Ideal Body Weight:  47.7 kg  BMI:  Body mass index is 34.46 kg/(m^2).  Estimated Nutritional Needs:   Kcal:  0929-5747  Protein:  85-100 grams  Fluid:  1.7 - 1.9  L/day  EDUCATION NEEDS:   No education needs identified at this time  Corrin Parker, MS, RD, LDN Pager # [phone] After hours/ weekend pager # [phone]

## 2015-07-17 ENCOUNTER — Inpatient Hospital Stay (HOSPITAL_COMMUNITY): Payer: 59 | Admitting: Physical Therapy

## 2015-07-17 ENCOUNTER — Inpatient Hospital Stay (HOSPITAL_COMMUNITY): Payer: 59 | Admitting: Occupational Therapy

## 2015-07-17 ENCOUNTER — Inpatient Hospital Stay (HOSPITAL_COMMUNITY): Payer: 59 | Admitting: Speech Pathology

## 2015-07-17 LAB — GLUCOSE, CAPILLARY
GLUCOSE-CAPILLARY: 139 mg/dL — AB (ref 65–99)
Glucose-Capillary: 122 mg/dL — ABNORMAL HIGH (ref 65–99)
Glucose-Capillary: 138 mg/dL — ABNORMAL HIGH (ref 65–99)
Glucose-Capillary: 115 mg/dL — ABNORMAL HIGH (ref 65–99)

## 2015-07-17 MED ORDER — METHYLPHENIDATE HCL 5 MG PO TABS: 5.0000 mg | ORAL_TABLET | Freq: Every day | ORAL | Status: DC

## 2015-07-17 MED ORDER — TRAZODONE HCL 50 MG PO TABS
50.0000 mg | ORAL_TABLET | Freq: Every day | ORAL | Status: DC
  Administered 2015-07-17 – 2015-07-22 (×6): 50 mg via ORAL
  Filled 2015-07-17 (×6): qty 1

## 2015-07-17 MED ORDER — METHYLPHENIDATE HCL 5 MG PO TABS
5.0000 mg | ORAL_TABLET | Freq: Every day | ORAL | Status: DC
  Administered 2015-07-17 – 2015-07-30 (×14): 5 mg via ORAL
  Filled 2015-07-17 (×14): qty 1

## 2015-07-17 NOTE — Progress Notes (Signed)
Patient noted to have stool in vaginal vault--question rectovaginal fistula.  Patient and husband both report that this is a new occurrence. Discussed with OB resident on call and difficulty performing GYN exam.  She recommends follow up after discharge and their office will set an appointment after 3/14.

## 2015-07-17 NOTE — Progress Notes (Signed)
Occupational Therapy Session Note  Patient Details  Name: Becky Patterson MRN: [id number] Date of Birth: Jun 02, 1955  Today's Date: 07/17/2015 OT Individual Time: 1045-1200 OT Individual Time Calculation (min): 75 min    Short Term Goals: Week 1:  OT Short Term Goal 1 (Week 1): Pt will maintain static sitting balance EOB for 2 mins with close supervision in preparation for selfcare tasks.  OT Short Term Goal 1 - Progress (Week 1): Not met OT Short Term Goal 2 (Week 1): Pt will complete UB bathing and dressing sitting supported with min assist. OT Short Term Goal 2 - Progress (Week 1): Not met OT Short Term Goal 3 (Week 1): Pt will transfer squat pivot to the drop arm commode with max assist of 1 person.  OT Short Term Goal 3 - Progress (Week 1): Not met OT Short Term Goal 4 (Week 1): Pt will locate all grooming and bathing items placed left of midline with no more than mod instructional cueing. OT Short Term Goal 4 - Progress (Week 1): Not met OT Short Term Goal 5 (Week 1): Pt's spouse will return demonstrate safe assist with LUE PROM exercises following handout.   OT Short Term Goal 5 - Progress (Week 1): Met Week 2:  OT Short Term Goal 1 (Week 2): Pt will maintain static sitting balance EOB for 2 mins with close supervision in preparation for selfcare tasks.  OT Short Term Goal 2 (Week 2): Pt will complete UB bathing and dressing sitting supported with min assist. OT Short Term Goal 3 (Week 2): Pt will transfer squat pivot to the drop arm commode with max assist of 1 person.  OT Short Term Goal 4 (Week 2): Pt will locate all grooming and bathing items placed left of midline with no more than mod instructional cueing. OT Short Term Goal 5 (Week 2): Pt will use the LUE during bathing sessions with max assist.       Skilled Therapeutic Interventions/Progress Updates:    Pt seen for skilled OT to facilitate functional mobility, L side awareness, postural control in sitting with ADL  retraining. Pt attached to PICC line so unable to change shirts. Pt worked on rolling with mod A to L , max to R to doff pants. Pt's brief removed and it was soiled with BM. During cleansing noticed that all of the BM was in pt's vaginal area. After extensive cleansing, more BM appeared to be coming through pt's vaginal area. RN called in to assess, as did the PA. Determined pt may have an anal fissure. Pt reported that her mother had the same issue. Pt thoroughly cleansed and new pants donned with max A. Pt sat to EOB with +2 A and worked on sitting balance from EOB with max A and max cues to lean to her R and guide her head to the L. Transferred to chair with max A +2 and then completed grooming at sink. Oral care completed so pt could drink her 8oz of water. Pt set up in chair with quick release belt on and all needs met.  Therapy Documentation Precautions:  Precautions Precautions: Fall Precaution Comments: Lt neglect , poor trunk control, decreased awareness Restrictions Weight Bearing Restrictions: No    Vital Signs: Therapy Vitals Temp: 98.5 F (36.9 C) Temp Source: Oral Pulse Rate: (!) 101 Resp: 18 BP: 111/67 mmHg Patient Position (if appropriate): Lying Oxygen Therapy SpO2: 97 % O2 Device: Not Delivered Pain: no c/o pain   ADL:  See Function Navigator  for Current Functional Status.   Therapy/Group: Individual Therapy  Rowland Ericsson 07/17/2015, 8:34 AM

## 2015-07-17 NOTE — Progress Notes (Signed)
Speech Language Pathology Daily Session Note  Patient Details  Name: Becky Patterson MRN: [id number] Date of Birth: 29-Jan-1956  Today's Date: 07/17/2015 SLP Individual Time: 0900-1000 SLP Individual Time Calculation (min): 60 min  Short Term Goals: Week 2: SLP Short Term Goal 1 (Week 2): Pt will attend to the left in >75% of observable opportunities with max assist verbal cues.   SLP Short Term Goal 2 (Week 2): Pt will sustain her attention to basic familiar tasks for 3-5 minute intervals with mod assist verbal cues for redirection.  SLP Short Term Goal 3 (Week 2): Pt will consume dys 3 textures and nectar thick liquids with min verbal cues for use of swallowing precautions.   SLP Short Term Goal 4 (Week 2): Pt willl consume trials of water via cup sips as part of the water protocol with minimal overt s/s of aspiration for 3 consecutive sessions with min verbal cues for use of swallowing precautions.  SLP Short Term Goal 5 (Week 2): Pt will identify at least 2 deficits occurring s/p CVA with mod question cues.    Skilled Therapeutic Interventions:  Pt was seen for skilled ST targeting goals for dysphagia and cognition.  SLP facilitated the session mod verbal cues for rate and portion control during presentations of pt's currently prescribed diet.  Pt demonstrated strong, reflexive cough following initial cup sip of nectar thick coffee which SLP suspects to be related to impaired timing of swallow with instances of persistent coughing outside of POs (impaired respiratory status: heavy smoker, lung mass) as consecutive sips of nectar thick liquids were tolerated without overt s/s of aspiration.  Pt also required mod verbal cues for rate and portion control as well as for use of liquid wash to clear residuals from left buccal cavity.  Pt required mod faded to min verbal cues to locate items to the left of midline for tray set up.  She was able to attend to self feeding tasks for up to 1 minute with mod  verbal cues for redirection and task initiation.  Pt was left in bed with bed alarm set and with nursing and husband at bedside.  Continue per current plan of care.    Function:  Eating Eating                 Cognition Comprehension Comprehension assist level: Understands basic 75 - 89% of the time/ requires cueing 10 - 24% of the time  Expression   Expression assist level: Expresses basic 75 - 89% of the time/requires cueing 10 - 24% of the time. Needs helper to occlude trach/needs to repeat words.  Social Interaction Social Interaction assist level: Interacts appropriately 25 - 49% of time - Needs frequent redirection.  Problem Solving Problem solving assist level: Solves basic 25 - 49% of the time - needs direction more than half the time to initiate, plan or complete simple activities  Memory Memory assist level: Recognizes or recalls 25 - 49% of the time/requires cueing 50 - 75% of the time    Pain Pain Assessment Pain Assessment: No/denies pain  Therapy/Group: Individual Therapy  Joane Postel, Selinda Orion 07/17/2015, 11:37 AM

## 2015-07-17 NOTE — Progress Notes (Signed)
Social Work Patient ID: Becky Patterson, female   DOB: 02-08-1956, 60 y.o.   MRN: [id number] Met pt and husband and saw pt's sister this am to discuss team conference progression toward her goals and discharge still 3/14. Sister can see the progress she has made and realizes she will be much Care at home. Unsure if husband realizes this. He is here daily and attends therapies with pt. Family still very committed to taking pt home at discharge. Will discuss later lift options and if family could purchase a stedy. Someone is here from her family daily and participates with her as much as therapists will allow. Will continue to work on discharge needs and provide support.

## 2015-07-17 NOTE — Progress Notes (Signed)
Physical Therapy Note  Patient Details  Name: Becky Patterson MRN: [id number] Date of Birth: Aug 22, 1955 Today's Date: 07/17/2015    Time: 096-283 66 minutes  1:1 No c/o pain. Pt's sister states pt did not sleep well last night, pt with increased lethargy this morning.  Pt max A with rolling with use of bed rails, total A to don pants and Rt knee brace.  Supine to sit with max A for trunk control, initial max A for sitting balance, progresses to min A with Rt elbow wt bearing.  Squat pivot to w/c with +2 assist for safety.  Sit to stands in stedy and standing tolerance with wt shifts x 4 during session. Pt with manual facilitation for Lt glute squeeze and Lt LE control, able to shift wt to Rt when given a target to reach for, unable to maintain Rt wt shift >10 seconds.  Pt improving sit to stand in stedy, still requires assistance with safety due to Lt lean and impaired trunk control.  Attempted sit to stand at railing in hallway, pt max/total A due to fatigue.  Pt asks to use bed pan. Pt +2 for squat pivot transfer to bed and performed bridging to scoot up in bed multiple attempts with some palpable glute contraction on Lt, max A for scooting to head of bed.  Pt placed on bed pan, nursing notified.   Zaeem Kandel 07/17/2015, 8:54 AM

## 2015-07-17 NOTE — Progress Notes (Signed)
Subjective/Complaints: Poor fluid intake , 25-60% meals Sister states that there is some unrecorded fluid intake ROS- No SOB/CP No abd pain, - N/V, no blood in stool, dark stools  Objective: Vital Signs: Blood pressure 111/67, pulse 101, temperature 98.5 F (36.9 C), temperature source Oral, resp. rate 18, height '5\' 1"'$  (1.549 m), weight 82.691 kg (182 lb 4.8 oz), SpO2 97 %. Dg Swallowing Func-speech Pathology  07/15/2015  Objective Swallowing Evaluation: Type of Study: MBS-Modified Barium Swallow Study Patient Details Name: MODESTY RUDY MRN: [id number] Date of Birth: 10-01-1955 Today's Date: 07/15/2015 Time: SLP Start Time (ACUTE ONLY): 1340 SLP Stop Time (ACUTE ONLY): 1410 SLP Time Calculation (min) (ACUTE ONLY): 30 min Past Medical History: Past Medical History Diagnosis Date . Lung mass  . Bacterial endocarditis  . Splenic infarction  . Epistaxis  Past Surgical History: Past Surgical History Procedure Laterality Date . Back surgery   . Tee without cardioversion N/A 06/24/2015   Procedure: TRANSESOPHAGEAL ECHOCARDIOGRAM (TEE);  Surgeon: Larey Dresser, MD;  Location: [place];  Service: Cardiovascular;  Laterality: N/A; . Radiology with anesthesia N/A 06/29/2015   Procedure: RADIOLOGY WITH ANESTHESIA;  Surgeon: Luanne Bras, MD;  Location: [facility];  Service: Radiology;  Laterality: N/A; . Cholecystectomy   HPI: DONA WALBY is a 60 y.o. female history of infective endocarditis that was treated with discharge on 2/2, continuing on antibiotics at home, lung mass noted on that admission. Re-admitted with left sided weakness that progressed after admission. 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  Pt admitted to CIR on 07/07/15.  Tolerating Dys 3, honey thick liquids diet.  Repeat MBS ordered to objectively determine readiness to advance given improvements noted at bedside.  No Data Recorded Assessment / Plan / Recommendation CHL IP CLINICAL IMPRESSIONS 07/15/2015  Therapy Diagnosis Mild pharyngeal phase dysphagia;Mild oral phase dysphagia Clinical Impression Pt presents with improved swallowing function and now presents with a mild oropharyngeal dysphagia with sensory>motor deficits.  Pt presents with left sided labial and lingual weakness which results in anterior loss of boluses, impaired mastication of solids, and prolonged posterior transit of boluses to the oropharynx.  Decreased pharyngeal sensation and base of tongue weakness resulted in poor bolus cohesion and premature spillage of materials into the pharynx with swallow response triggered at the vallecula with thickened liquids and solids, delayed to the pyriforms with thin liquids.  Delay in swallow initiation resulted in penetration of large boluses of nectar thick liquids and thin liquids.  Penetration of thin liquids was greater in amount and occurred deeper into the laryngeal vestibule, requiring increased cues for throat clear and extra swallows to clear.  Chin tuck ineffective largely due to cognitive deficits impacting pt's ability to successfully time strategy during the swallow.   Given pt's significant cognitive deficits, limited mobility, and persistent lethargy, recommend conservative advancement to Dys 3, nectar thick liquids with full supervision for use of swallowing precautions.  Pt would also benefit from implementation of the water protocol to continue working towards liquids advancement.  Prognosis for advancement good given progress made thus far with continued ST interventions for trials of advanced consistencies and use of compensatory swallowing strategies.   Impact on safety and function Mild aspiration risk     Prognosis 07/03/2015 Prognosis for Safe Diet Advancement Good Barriers to Reach Goals -- Barriers/Prognosis Comment -- CHL IP DIET RECOMMENDATION 07/15/2015 SLP Diet Recommendations Dysphagia 3 (Mech soft) solids;Nectar thick liquid Liquid Administration via Cup;No straw Medication  Administration Crushed with puree Compensations  Slow rate;Small sips/bites;Lingual sweep for clearance of pocketing Postural Changes Seated upright at 90 degrees            CHL IP ORAL PHASE 07/15/2015 Oral Phase Impaired Oral - Pudding Teaspoon -- Oral - Pudding Cup -- Oral - Honey Teaspoon -- Oral - Honey Cup Left anterior bolus loss;Delayed oral transit;Decreased bolus cohesion;Premature spillage;Lingual/palatal residue Oral - Nectar Teaspoon -- Oral - Nectar Cup Left anterior bolus loss;Delayed oral transit;Lingual/palatal residue;Decreased bolus cohesion;Premature spillage Oral - Nectar Straw Lingual/palatal residue;Decreased bolus cohesion;Premature spillage Oral - Thin Teaspoon -- Oral - Thin Cup Left anterior bolus loss;Lingual/palatal residue;Delayed oral transit;Decreased bolus cohesion;Premature spillage Oral - Thin Straw Lingual/palatal residue;Decreased bolus cohesion;Premature spillage Oral - Puree Lingual/palatal residue;Delayed oral transit;Premature spillage Oral - Mech Soft Lingual/palatal residue;Premature spillage;Delayed oral transit;Impaired mastication Oral - Regular -- Oral - Multi-Consistency -- Oral - Pill -- Oral Phase - Comment --  CHL IP PHARYNGEAL PHASE 07/15/2015 Pharyngeal Phase Impaired Pharyngeal- Pudding Teaspoon -- Pharyngeal -- Pharyngeal- Pudding Cup -- Pharyngeal -- Pharyngeal- Honey Teaspoon -- Pharyngeal -- Pharyngeal- Honey Cup Delayed swallow initiation-vallecula;Reduced tongue base retraction;Pharyngeal residue - valleculae Pharyngeal -- Pharyngeal- Nectar Teaspoon -- Pharyngeal -- Pharyngeal- Nectar Cup Delayed swallow initiation-vallecula;Reduced tongue base retraction;Pharyngeal residue - valleculae;Penetration/Aspiration during swallow Pharyngeal Material enters airway, remains ABOVE vocal cords then ejected out Pharyngeal- Nectar Straw -- Pharyngeal -- Pharyngeal- Thin Teaspoon -- Pharyngeal -- Pharyngeal- Thin Cup Delayed swallow initiation-pyriform sinuses;Reduced  tongue base retraction;Penetration/Aspiration during swallow;Pharyngeal residue - valleculae;Pharyngeal residue - pyriform Pharyngeal Material enters airway, remains ABOVE vocal cords and not ejected out Pharyngeal- Thin Straw Delayed swallow initiation-pyriform sinuses;Reduced tongue base retraction;Penetration/Aspiration during swallow;Pharyngeal residue - valleculae;Pharyngeal residue - pyriform Pharyngeal Material enters airway, remains ABOVE vocal cords and not ejected out Pharyngeal- Puree Delayed swallow initiation-vallecula;Reduced tongue base retraction Pharyngeal -- Pharyngeal- Mechanical Soft Delayed swallow initiation-vallecula;Reduced tongue base retraction Pharyngeal -- Pharyngeal- Regular -- Pharyngeal -- Pharyngeal- Multi-consistency -- Pharyngeal -- Pharyngeal- Pill -- Pharyngeal -- Pharyngeal Comment --  CHL IP CERVICAL ESOPHAGEAL PHASE 07/03/2015 Cervical Esophageal Phase WFL Pudding Teaspoon -- Pudding Cup -- Honey Teaspoon -- Honey Cup -- Nectar Teaspoon -- Nectar Cup -- Nectar Straw -- Thin Teaspoon -- Thin Cup -- Thin Straw -- Puree -- Mechanical Soft -- Regular -- Multi-consistency -- Pill -- Cervical Esophageal Comment -- No flowsheet data found. Page, Selinda Orion 07/15/2015, 4:17 PM              Results for orders placed or performed during the hospital encounter of 07/07/15 (from the past 72 hour(s))  Glucose, capillary     Status: Abnormal   Collection Time: 07/14/15 11:54 AM  Result Value Ref Range   Glucose-Capillary 112 (H) 65 - 99 mg/dL   Comment 1 Notify RN   Glucose, capillary     Status: Abnormal   Collection Time: 07/14/15  4:43 PM  Result Value Ref Range   Glucose-Capillary 117 (H) 65 - 99 mg/dL   Comment 1 Notify RN   Glucose, capillary     Status: Abnormal   Collection Time: 07/14/15  9:41 PM  Result Value Ref Range   Glucose-Capillary 126 (H) 65 - 99 mg/dL  Glucose, capillary     Status: Abnormal   Collection Time: 07/15/15  6:34 AM  Result Value Ref Range    Glucose-Capillary 105 (H) 65 - 99 mg/dL  Glucose, capillary     Status: Abnormal   Collection Time: 07/15/15 12:18 PM  Result Value Ref Range   Glucose-Capillary 101 (H) 65 -  99 mg/dL   Comment 1 Notify RN   Glucose, capillary     Status: Abnormal   Collection Time: 07/15/15  4:50 PM  Result Value Ref Range   Glucose-Capillary 131 (H) 65 - 99 mg/dL  Glucose, capillary     Status: Abnormal   Collection Time: 07/15/15  8:44 PM  Result Value Ref Range   Glucose-Capillary 135 (H) 65 - 99 mg/dL  Glucose, capillary     Status: Abnormal   Collection Time: 07/16/15  7:23 AM  Result Value Ref Range   Glucose-Capillary 130 (H) 65 - 99 mg/dL  Glucose, capillary     Status: Abnormal   Collection Time: 07/16/15 11:36 AM  Result Value Ref Range   Glucose-Capillary 111 (H) 65 - 99 mg/dL  Glucose, capillary     Status: Abnormal   Collection Time: 07/16/15  5:00 PM  Result Value Ref Range   Glucose-Capillary 122 (H) 65 - 99 mg/dL  Glucose, capillary     Status: Abnormal   Collection Time: 07/16/15  9:54 PM  Result Value Ref Range   Glucose-Capillary 147 (H) 65 - 99 mg/dL  Glucose, capillary     Status: Abnormal   Collection Time: 07/17/15  6:56 AM  Result Value Ref Range   Glucose-Capillary 122 (H) 65 - 99 mg/dL     HEENT: normal Cardio: RRR and no murmur Resp: CTA B/L and unlabored GI: BS positive and NT, ND Extremity:  No Edema Skin:   Intact and Other no heel breakdown, no evidence of rash Neuro: Lethargic, Confused, Flat, Cranial Nerve Abnormalities Left CN central 7, Abnormal Sensory reduced pinch sensation in LUE and LLE, Abnormal Motor 0/5 LUE and LLE, Tone:  clonus in Left ankle, Dysarthric and Inattention, left field cut Musc/Skel:  Other no pain with LUE or LLE ROM, tenderness over the R trap. Left heel cord tight as well as finger flexors Gen NAD   Assessment/Plan: 1. Functional deficits secondary to Right MCA infarct with Left HP, Left neglect, Left hemisensory deficits  which require 3+ hours per day of interdisciplinary therapy in a comprehensive inpatient rehab setting. Physiatrist is providing close team supervision and 24 hour management of active medical problems listed below. Physiatrist and rehab team continue to assess barriers to discharge/monitor patient progress toward functional and medical goals. FIM: Function - Bathing Position: Bed Body parts bathed by patient: Right upper leg, Front perineal area, Right lower leg, Left upper leg, Left arm, Chest, Abdomen Body parts bathed by helper: Back, Left lower leg, Buttocks, Right arm Bathing not applicable: Right arm, Left arm, Chest, Abdomen (pt connected to PICC line, could not change shirt at the time) Assist Level: 2 helpers  Function- Upper Body Dressing/Undressing What is the patient wearing?: Pull over shirt/dress Pull over shirt/dress - Perfomed by patient: Thread/unthread right sleeve Pull over shirt/dress - Perfomed by helper: Put head through opening, Thread/unthread left sleeve, Pull shirt over trunk Assist Level: Touching or steadying assistance(Pt > 75%) (modA) Function - Lower Body Dressing/Undressing What is the patient wearing?: Pants, Socks, Shoes Position: Bed Pants- Performed by patient: Thread/unthread right pants leg Pants- Performed by helper: Thread/unthread left pants leg, Pull pants up/down Non-skid slipper socks- Performed by helper: Don/doff right sock, Don/doff left sock Socks - Performed by helper: Don/doff right sock, Don/doff left sock Shoes - Performed by patient: Don/doff right shoe Shoes - Performed by helper: Don/doff right shoe, Don/doff left shoe, Fasten right, Fasten left Assist for lower body dressing: 2 Helpers  Function -  Toileting Toileting activity did not occur: No continent bowel/bladder event Assist level: Two helpers  Function - Air cabin crew transfer activity did not occur: Safety/medical concerns Assist level to toilet: 2  helpers Assist level from toilet: 2 helpers  Function - Chair/bed transfer Chair/bed transfer [place]: Squat pivot Chair/bed transfer assist level: 2 helpers Chair/bed transfer assistive device: Armrests Mechanical lift: [place] Chair/bed transfer details: Verbal cues for sequencing, Verbal cues for technique, Manual facilitation for weight shifting, Manual facilitation for placement, Manual facilitation for weight bearing  Function - Locomotion: Wheelchair Will patient use wheelchair at discharge?: Yes Type: Manual (TIS) Max wheelchair distance: 150 Assist Level: Dependent (Pt equals 0%) Assist Level: Dependent (Pt equals 0%) Assist Level: Dependent (Pt equals 0%) Function - Locomotion: Ambulation Ambulation activity did not occur: Safety/medical concerns  Function - Comprehension Comprehension: Auditory Comprehension assist level: Understands basic 75 - 89% of the time/ requires cueing 10 - 24% of the time  Function - Expression Expression: Verbal Expression assist level: Expresses basic 75 - 89% of the time/requires cueing 10 - 24% of the time. Needs helper to occlude trach/needs to repeat words.  Function - Social Interaction Social Interaction assist level: Interacts appropriately 50 - 74% of the time - May be physically or verbally inappropriate.  Function - Problem Solving Problem solving assist level: Solves basic 25 - 49% of the time - needs direction more than half the time to initiate, plan or complete simple activities  Function - Memory Memory assist level: Recognizes or recalls 25 - 49% of the time/requires cueing 50 - 75% of the time Patient normally able to recall (first 3 days only): Current season  Medical Problem List and Plan: 1.  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 No antithrombotic needed.Cont Abx thru 3/7  - 2.  DVT Prophylaxis/Anticoagulation: Pharmaceutical: Lovenox 3. Pain  Management: Has chronic back pain per family. Will use tylenol prn for now  -topamax for postroke headache  -tramadol in place of  Percocet due to  itching  -overall improvement in sleep/headache 4. Mood: LCSW to follow for evaluation and support.   5. Neuropsych: This patient is not fully capable of making decisions on her own behalf. 6. Skin/Wound Care: routine pressure relief measures.   7. Fluids/Electrolytes/Nutrition: Monitor I/O.upgraded to D3 nectar, will cont Megace, D/C topamax dose    Getting dehydrated with honey liquids.   IVF at nights and need to encourage fluid intake during the day. Only 339m po recorded, recheck BMET in am 8. 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 PET scan on outpatient basis. 10. Hypokalemia: K+ 3.4 2/14 will recheck in am increase KCL supplement,  11. Cytotoxic edema: Weaned of hypertonic solution.    12. History of Maculopapular rash:  Likely due to antibiotics per ID and itching being managed with prn benadryl. Antibiotics narrowed to Ceftriaxone on 02/11--monitor for now. IF rash recurs will need to change back to imipenem.   Antibiotic-End date March 7th   13. Anemia: Hgb low but has stayed in 7.6-8.0 range, check stool guaic was pos 1/23, neg on 2/2 and 2/16, Hgb stable at 8.1 2/20  -follow up labs monday 14. Hypotension: Cont to monitor 15. Headache: topamax '25mg'$  BID, Tramadol '100mg'$  Q6h prn, improved,off topamax  LOS (Days) 10 A FACE TO FACE EVALUATION WAS PERFORMED  KIRSTEINS,ANDREW E 07/17/2015, 7:50 AM

## 2015-07-18 ENCOUNTER — Inpatient Hospital Stay (HOSPITAL_COMMUNITY): Payer: 59 | Admitting: Occupational Therapy

## 2015-07-18 ENCOUNTER — Inpatient Hospital Stay (HOSPITAL_COMMUNITY): Payer: 59 | Admitting: Physical Therapy

## 2015-07-18 ENCOUNTER — Inpatient Hospital Stay (HOSPITAL_COMMUNITY): Payer: 59 | Admitting: Speech Pathology

## 2015-07-18 ENCOUNTER — Other Ambulatory Visit: Payer: Self-pay | Admitting: Oncology

## 2015-07-18 ENCOUNTER — Telehealth: Payer: Self-pay | Admitting: *Deleted

## 2015-07-18 LAB — GLUCOSE, CAPILLARY
GLUCOSE-CAPILLARY: 120 mg/dL — AB (ref 65–99)
GLUCOSE-CAPILLARY: 142 mg/dL — AB (ref 65–99)
GLUCOSE-CAPILLARY: 133 mg/dL — AB (ref 65–99)
Glucose-Capillary: 114 mg/dL — ABNORMAL HIGH (ref 65–99)

## 2015-07-18 MED ORDER — CHLORHEXIDINE GLUCONATE 0.12 % MT SOLN
OROMUCOSAL | Status: AC
  Administered 2015-07-18: 15 mL via OROMUCOSAL
  Filled 2015-07-18: qty 15

## 2015-07-18 NOTE — Telephone Encounter (Signed)
This RN received VM from [place] with [facility].  She wanted to let Dr Jannifer Rodney to know pt has been admitted for inhouse rehab post stroke.  Expected discharge at this time is for 08/05/2015.  MD made aware of above.

## 2015-07-18 NOTE — Progress Notes (Signed)
Occupational Therapy Session Note  Patient Details  Name: Becky Patterson MRN: [id number] Date of Birth: 1955-11-16  Today's Date: 07/18/2015 OT Individual Time: 1100-1200 OT Individual Time Calculation (min): 60 min    Short Term Goals: Week 2:  OT Short Term Goal 1 (Week 2): Pt will maintain static sitting balance EOB for 2 mins with close supervision in preparation for selfcare tasks.  OT Short Term Goal 2 (Week 2): Pt will complete UB bathing and dressing sitting supported with min assist. OT Short Term Goal 3 (Week 2): Pt will transfer squat pivot to the drop arm commode with max assist of 1 person.  OT Short Term Goal 4 (Week 2): Pt will locate all grooming and bathing items placed left of midline with no more than mod instructional cueing. OT Short Term Goal 5 (Week 2): Pt will use the LUE during bathing sessions with max assist.     Skilled Therapeutic Interventions/Progress Updates:    1:1 NMR with focus on functional transfer training with slow and controlled weight shifts, maintaining a forward controlled postural control. Pt required max A +2 for safety transfer towards pt's right onto the mat. Continued focus on controlled trunk and postural movements while maintaining balance. Pt with tendency to move quickly and impulsively resulting in loss of balance.  When LOB towards her left would occurred- mod A to return to sitting with incorporating left hand reaching for object in far right field for a target to help initiate weight shift towards left.   Again trial of use with STEDY to decr burden of care with transfers for caregivers- however pt with increased fear of falling.  Pt would push harder towards her left with inability to relax to sit on seat of STEDY, despite different attempts for right hand placement.    Sit to supine with max A for controlled decent to the mat. Focus on rolling towards her right with initiation of head turn and activation of hips and trunk.  Mod cuing to  attend and manage left UE.  In sidelying pt with some movement in shoulder elevation and depression and protraction (with gravity and extra time).   Return to sitting with mod A. Transfer back to tilt in space w/c with a therapist on her right side and therapist in front of her to perform lateral scoot transfer back into w/c.  With manual facilitation to maintain a safe position; pt able to initiate each scoot of transfer towards chair with extra time and multimodal cuing.  Left pt with husband at end of session.   Therapy Documentation Precautions:  Precautions Precautions: Fall Precaution Comments: Lt neglect , poor trunk control, decreased awareness Restrictions Weight Bearing Restrictions: No Pain: Pain Assessment Pain Assessment: 0-10 Pain Score: 2  Pain Type: Acute pain Pain Location: Head Pain Descriptors / Indicators: Headache Pain Intervention(s): Medication (See eMAR)  See Function Navigator for Current Functional Status.   Therapy/Group: Individual Therapy  Willeen Cass [facility] [place] [place] 07/18/2015, 2:35 PM

## 2015-07-18 NOTE — Progress Notes (Signed)
Physical Therapy Note  Patient Details  Name: Becky Patterson MRN: [id number] Date of Birth: [birth year] Today's Date: 07/18/2015    Time:730-826 56 minutes  1:1 No c/o pain.  Supine to sit with max A. Seated EOB pt able to maintain balance by holding railing with Rt LE 10-15 seconds at a time before losing attention and losing balance to Lt requiring max/total A to correct.  Squat pivot to w/c with +2 assist for lifting and pivoting.  Standing in stedy with focus on posture, wt shifts to Rt.  Improved palpable Lt glute contraction today, pt able to reach and stand with max A in stedy.  Attempted sitting in stedy multiple attempts, pt continues to push with Rt LE with attempts to sit in stedy, unable to flex Rt knee to safely sit on seat in stedy. Adjusted pt's position in stedy all directions but still pt unable to safely sit.  Sit to stand repetitions with focus on eccentric control with max A in stedy.  Seated balance with reaching out of BOS and attempting to find midline with min/mod A for seated balance.    [name] 07/18/2015, 8:26 AM

## 2015-07-18 NOTE — Progress Notes (Signed)
Speech Language Pathology Daily Session Note  Patient Details  Name: Becky Patterson MRN: [id number] Date of Birth: 1955-07-31  Today's Date: 07/18/2015 SLP Individual Time: 0900-1000 SLP Individual Time Calculation (min): 60 min  Short Term Goals: Week 2: SLP Short Term Goal 1 (Week 2): Pt will attend to the left in >75% of observable opportunities with max assist verbal cues.   SLP Short Term Goal 2 (Week 2): Pt will sustain her attention to basic familiar tasks for 3-5 minute intervals with mod assist verbal cues for redirection.  SLP Short Term Goal 3 (Week 2): Pt will consume dys 3 textures and nectar thick liquids with min verbal cues for use of swallowing precautions.   SLP Short Term Goal 4 (Week 2): Pt willl consume trials of water via cup sips as part of the water protocol with minimal overt s/s of aspiration for 3 consecutive sessions with min verbal cues for use of swallowing precautions.  SLP Short Term Goal 5 (Week 2): Pt will identify at least 2 deficits occurring s/p CVA with mod question cues.    Skilled Therapeutic Interventions:  Pt was seen for skilled ST targeting goals for dysphagia and cognition.  SLP utilized the Dyn[employer]ision to address visual scanning to the left of midline, task initiation, and sustained attention to tasks.  On mode A with a visual field spanning 3 concentric rings, pt was initially able to locate 14 targets in 1 minute with a 4.29 second reaction time, which is an improvement from previous trials; however, with continued practice pt demonstrated slowed processing with fewer targets identified in 1 minute (worst trial was 5 targets identified in 1 minute, average reaction time of 12 seconds), likely due to fatigue.  Slight improvement noted with reduction in visual field with pt able to locate 10 targets in 1 minute and 6 second reaction time.  Max assist verbal and tactile cues needed throughout all trials to facilitate task initiation and visual scanning to  the left.  Pt able to sustain attention to task for a max of ~1 minute with max verbal cues for redirection.  Pt was able to set up a large checker board with max faded to mod assist verbal cues for visual scanning to the left of midline and sustained her attention to task for ~2 minute intervals with mod verbal cues.  SLP facilitated the session with trials of regular, unthickened water following thorough oral care per the water protocol to continue working towards liquids advancement.  Pt consumed ~6 oz of liquids without overt s/s of aspiration.  Pt left upright in wheelchair with call bell within reach and quick release belt donned.  Continue per current plan of care.    Function:  Eating Eating                 Cognition Comprehension Comprehension assist level: Understands basic 75 - 89% of the time/ requires cueing 10 - 24% of the time  Expression   Expression assist level: Expresses basic 75 - 89% of the time/requires cueing 10 - 24% of the time. Needs helper to occlude trach/needs to repeat words.  Social Interaction Social Interaction assist level: Interacts appropriately 25 - 49% of time - Needs frequent redirection.  Problem Solving Problem solving assist level: Solves basic 25 - 49% of the time - needs direction more than half the time to initiate, plan or complete simple activities  Memory Memory assist level: Recognizes or recalls 25 - 49% of the time/requires cueing 50 -  75% of the time    Pain Pain Assessment Pain Assessment: No/denies pain  Therapy/Group: Individual Therapy  Becky Patterson, Selinda Orion 07/18/2015, 12:27 PM

## 2015-07-18 NOTE — Progress Notes (Signed)
Speech Language Pathology Daily Session Note  Patient Details  Name: Becky Patterson MRN: [id number] Date of Birth: 10-Mar-1956  Today's Date: 07/18/2015 SLP Individual Time: 1430-1500 SLP Individual Time Calculation (min): 30 min  Short Term Goals: Week 2: SLP Short Term Goal 1 (Week 2): Pt will attend to the left in >75% of observable opportunities with max assist verbal cues.   SLP Short Term Goal 2 (Week 2): Pt will sustain her attention to basic familiar tasks for 3-5 minute intervals with mod assist verbal cues for redirection.  SLP Short Term Goal 3 (Week 2): Pt will consume dys 3 textures and nectar thick liquids with min verbal cues for use of swallowing precautions.   SLP Short Term Goal 4 (Week 2): Pt willl consume trials of water via cup sips as part of the water protocol with minimal overt s/s of aspiration for 3 consecutive sessions with min verbal cues for use of swallowing precautions.  SLP Short Term Goal 5 (Week 2): Pt will identify at least 2 deficits occurring s/p CVA with mod question cues.    Skilled Therapeutic Interventions:  Pt was seen for skilled ST targeting cognitive goals.  SLP facilitated the session with a basic card game targeting sustained attention to task and task initiation.  Pt initially required max assist verbal cues to complete the abovementioned task due to impulsivity and poor error awareness; as task progressed SLP was able to fade cues to min assist verbal cues.  SLP then had pt sort cards into 4 groups by suit.  Pt required consistent min assist verbal cues to initiate looking to the left.  Pt initially required mod assist verbal cues for organization to sort cards into correct groups but as task progressed SLP was able to fade cues to min assist.  Pt was returned to room and left in wheelchair with quick release belt donned and call bell within reach.  Continue per current plan of care.    Function:  Eating Eating                  Cognition Comprehension Comprehension assist level: Understands basic 75 - 89% of the time/ requires cueing 10 - 24% of the time  Expression   Expression assist level: Expresses basic 75 - 89% of the time/requires cueing 10 - 24% of the time. Needs helper to occlude trach/needs to repeat words.  Social Interaction Social Interaction assist level: Interacts appropriately 25 - 49% of time - Needs frequent redirection.  Problem Solving Problem solving assist level: Solves basic 25 - 49% of the time - needs direction more than half the time to initiate, plan or complete simple activities  Memory Memory assist level: Recognizes or recalls 25 - 49% of the time/requires cueing 50 - 75% of the time    Pain Pain Assessment Pain Assessment: No/denies pain   Therapy/Group: Individual Therapy  Shawnetta Lein, Selinda Orion 07/18/2015, 3:36 PM

## 2015-07-19 ENCOUNTER — Inpatient Hospital Stay (HOSPITAL_COMMUNITY): Payer: 59 | Admitting: Speech Pathology

## 2015-07-19 ENCOUNTER — Inpatient Hospital Stay (HOSPITAL_COMMUNITY): Payer: 59 | Admitting: Occupational Therapy

## 2015-07-19 LAB — GLUCOSE, CAPILLARY
GLUCOSE-CAPILLARY: 118 mg/dL — AB (ref 65–99)
GLUCOSE-CAPILLARY: 148 mg/dL — AB (ref 65–99)
Glucose-Capillary: 127 mg/dL — ABNORMAL HIGH (ref 65–99)
Glucose-Capillary: 129 mg/dL — ABNORMAL HIGH (ref 65–99)

## 2015-07-19 NOTE — Progress Notes (Signed)
Speech Language Pathology Daily Session Note  Patient Details  Name: Becky Patterson MRN: [id number] Date of Birth: 08-Sep-1955  Today's Date: 07/19/2015 SLP Individual Time: 0900-0930 SLP Individual Time Calculation (min): 30 min  Short Term Goals: Week 2: SLP Short Term Goal 1 (Week 2): Pt will attend to the left in >75% of observable opportunities with max assist verbal cues.   SLP Short Term Goal 2 (Week 2): Pt will sustain her attention to basic familiar tasks for 3-5 minute intervals with mod assist verbal cues for redirection.  SLP Short Term Goal 3 (Week 2): Pt will consume dys 3 textures and nectar thick liquids with min verbal cues for use of swallowing precautions.   SLP Short Term Goal 4 (Week 2): Pt willl consume trials of water via cup sips as part of the water protocol with minimal overt s/s of aspiration for 3 consecutive sessions with min verbal cues for use of swallowing precautions.  SLP Short Term Goal 5 (Week 2): Pt will identify at least 2 deficits occurring s/p CVA with mod question cues.    Skilled Therapeutic Interventions: Pt seen for cognitive goals in the setting of a functional eating activity. Pt required frequent verbal and occasional tactile/visual cues to locate items on her tray, but was able to complete activity with > 75% acc with max A. Pt was encouraged to use her eyes to scan the area before sweeping with her arm. Pt able to perform visual scanning, but ceased effort unless behavior consistently reinforced by therapist or spouse. Also discussed with pt and spouse the importance of placing high vlaue/motivation items like coffee on the L side of her tray to facilitate increased pt initiation. Spouse agreed to try with the coffee. Occasional cueing to slow rate required. Reading single words completed with max A to achieve L attention.    Function:  Eating Eating   Modified Consistency Diet: Yes Eating Assist Level: Supervision or verbal cues;Help managing  cup/glass;Set up assist for   Eating Set Up Assist For: Opening containers;Cutting food       Cognition Comprehension Comprehension assist level: Understands basic 75 - 89% of the time/ requires cueing 10 - 24% of the time  Expression   Expression assist level: Expresses basic 75 - 89% of the time/requires cueing 10 - 24% of the time. Needs helper to occlude trach/needs to repeat words.  Social Interaction Social Interaction assist level: Interacts appropriately 75 - 89% of the time - Needs redirection for appropriate language or to initiate interaction.  Problem Solving Problem solving assist level: Solves basic 25 - 49% of the time - needs direction more than half the time to initiate, plan or complete simple activities  Memory Memory assist level: Recognizes or recalls 25 - 49% of the time/requires cueing 50 - 75% of the time    Pain Pain Assessment Pain Assessment: No/denies pain  Therapy/Group: Individual Therapy  Vinetta Bergamo 07/19/2015, 12:56 PM

## 2015-07-19 NOTE — Progress Notes (Signed)
Patient ID: Becky Patterson, female   DOB: 11-15-1955, 60 y.o.   MRN: [id number]  07/09/15.   Subjective/Complaints:  60 year old patient admitted for CIR with functional deficits secondary to Right MCA infarct with Left HP, Left neglect, Left hemisensory  Poor fluid intake , 25-60% meals Sister states that there is some unrecorded fluid intake ROS- No SOB/CP No abd pain, - N/V, no blood in stool, dark stools.  Some concern about a possible rectovaginal fistula  Past Medical History  Diagnosis Date  . Lung mass   . Bacterial endocarditis   . Splenic infarction   . Epistaxis     Past Surgical History  Procedure Laterality Date  . Back surgery    . Tee without cardioversion N/A 06/24/2015    Procedure: TRANSESOPHAGEAL ECHOCARDIOGRAM (TEE);  Surgeon: Larey Dresser, MD;  Location: [place];  Service: Cardiovascular;  Laterality: N/A;  . Radiology with anesthesia N/A 06/29/2015    Procedure: RADIOLOGY WITH ANESTHESIA;  Surgeon: Luanne Bras, MD;  Location: [place];  Service: Radiology;  Laterality: N/A;  . Cholecystectomy     Patient Vitals for the past 24 hrs:  BP Temp Temp src Pulse Resp SpO2 Weight  07/19/15 1210 - - - - - - 183 lb 4.8 oz (83.144 kg)  07/19/15 0513 103/60 mmHg 99.6 F (37.6 C) Oral (!) 101 18 94 % -  07/18/15 1407 117/67 mmHg 98.5 F (36.9 C) Oral 98 18 97 % -     Intake/Output Summary (Last 24 hours) at 07/19/15 1351 Last data filed at 07/18/15 1831  Gross per 24 hour  Intake    250 ml  Output      0 ml  Net    250 ml    Objective: Vital Signs: Blood pressure 103/60, pulse 101, temperature 99.6 F (37.6 C), temperature source Oral, resp. rate 18, height '5\' 1"'$  (1.549 m), weight 183 lb 4.8 oz (83.144 kg), SpO2 94 %.  Results for orders placed or performed during the hospital encounter of 07/07/15 (from the past 72 hour(s))  Glucose, capillary     Status: Abnormal   Collection Time: 07/16/15  5:00 PM  Result Value Ref Range   Glucose-Capillary  122 (H) 65 - 99 mg/dL  Glucose, capillary     Status: Abnormal   Collection Time: 07/16/15  9:54 PM  Result Value Ref Range   Glucose-Capillary 147 (H) 65 - 99 mg/dL  Glucose, capillary     Status: Abnormal   Collection Time: 07/17/15  6:56 AM  Result Value Ref Range   Glucose-Capillary 122 (H) 65 - 99 mg/dL  Glucose, capillary     Status: Abnormal   Collection Time: 07/17/15 11:37 AM  Result Value Ref Range   Glucose-Capillary 138 (H) 65 - 99 mg/dL  Glucose, capillary     Status: Abnormal   Collection Time: 07/17/15  4:44 PM  Result Value Ref Range   Glucose-Capillary 115 (H) 65 - 99 mg/dL  Glucose, capillary     Status: Abnormal   Collection Time: 07/17/15 10:00 PM  Result Value Ref Range   Glucose-Capillary 139 (H) 65 - 99 mg/dL  Glucose, capillary     Status: Abnormal   Collection Time: 07/18/15  6:40 AM  Result Value Ref Range   Glucose-Capillary 120 (H) 65 - 99 mg/dL  Glucose, capillary     Status: Abnormal   Collection Time: 07/18/15 12:18 PM  Result Value Ref Range   Glucose-Capillary 114 (H) 65 - 99 mg/dL   Comment  1 Notify RN   Glucose, capillary     Status: Abnormal   Collection Time: 07/18/15  4:28 PM  Result Value Ref Range   Glucose-Capillary 142 (H) 65 - 99 mg/dL  Glucose, capillary     Status: Abnormal   Collection Time: 07/18/15  8:57 PM  Result Value Ref Range   Glucose-Capillary 133 (H) 65 - 99 mg/dL  Glucose, capillary     Status: Abnormal   Collection Time: 07/19/15  6:42 AM  Result Value Ref Range   Glucose-Capillary 118 (H) 65 - 99 mg/dL  Glucose, capillary     Status: Abnormal   Collection Time: 07/19/15 11:23 AM  Result Value Ref Range   Glucose-Capillary 148 (H) 65 - 99 mg/dL     HEENT: normal Cardio: RRR and Grade 2/6 systolic murmur Resp: CTA B/L and unlabored GI: BS positive and NT, ND Extremity:  No Edema Skin:   Intact and Other no heel breakdown, no evidence of rash Neuro: Lethargic, Confused, Flat, Cranial Nerve Abnormalities Left  CN central 7, Abnormal Sensory reduced pinch sensation in LUE and LLE, Abnormal Motor 0/5 LUE and LLE, Tone:  clonus in Left ankle, Dysarthric and Inattention, left field cut Musc/Skel:  Left ankle and left wrist braces  Gen NAD   Medical Problem List and Plan: 1.  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 No antithrombotic needed.Cont Abx thru 3/7  - 2.  DVT Prophylaxis/Anticoagulation: Pharmaceutical: Lovenox 3. Pain Management: Has chronic back pain per family. Will use tylenol prn for now  -topamax for postroke headache  -tramadol in place of  Percocet due to  itching   4. Fluids/Electrolytes/Nutrition: Monitor I/O.upgraded to D3 nectar, recheck BMET in am 5. 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 PET scan on outpatient basis.    LOS (Days) 12 A FACE TO FACE EVALUATION WAS PERFORMED  Nyoka Cowden 07/19/2015, 1:47 PM

## 2015-07-19 NOTE — Progress Notes (Signed)
Occupational Therapy Session Note  Patient Details  Name: Becky Patterson MRN: [id number] Date of Birth: September 10, 1955  Today's Date: 07/19/2015 OT Individual Time: 1005-1100 OT Individual Time Calculation (min): 55 min    Short Term Goals: Week 2:  OT Short Term Goal 1 (Week 2): Pt will maintain static sitting balance EOB for 2 mins with close supervision in preparation for selfcare tasks.  OT Short Term Goal 2 (Week 2): Pt will complete UB bathing and dressing sitting supported with min assist. OT Short Term Goal 3 (Week 2): Pt will transfer squat pivot to the drop arm commode with max assist of 1 person.  OT Short Term Goal 4 (Week 2): Pt will locate all grooming and bathing items placed left of midline with no more than mod instructional cueing. OT Short Term Goal 5 (Week 2): Pt will use the LUE during bathing sessions with max assist.    Skilled Therapeutic Interventions/Progress Updates:    Pt seen for OT ADL bathing/ dressing session. Pt in supine upon arrival, easily awoken and agreeable to tx session. She transferred to EOB with + 2 assist. Mod- total A required for sitting balance. Max cuing for pt to reach for bed rail on R, sitting balance mod A when pt able to grasp bed rail, however, total A for dynamic sitting balance with UE support. She returned to supine for LB dressing to be completed total A. Sliding board transfer with +2 assist for transfer into w/c with pt able to assist with reaching to R arm rest during transfer. Pt completed oral care seated in w/c at sink. VCs required for scanning to obtain items placed on L side of sink. Perseveration tendencies displayed throughout task with verbal and tactile cuing required to move to next sequence of task. Pt left sitting up in tilt-in-space w/c at end of session, QRB donned, and husband present.   Therapy Documentation Precautions:  Precautions Precautions: Fall Precaution Comments: Lt neglect , poor trunk control, decreased  awareness Restrictions Weight Bearing Restrictions: No Pain:   no/ denies pain  See Function Navigator for Current Functional Status.   Therapy/Group: Individual Therapy  Lewis, Arjan Strohm C 07/19/2015, 11:13 AM

## 2015-07-20 ENCOUNTER — Inpatient Hospital Stay (HOSPITAL_COMMUNITY): Payer: 59

## 2015-07-20 LAB — CBC
HCT: 26.1 % — ABNORMAL LOW (ref 36.0–46.0)
HEMOGLOBIN: 8.5 g/dL — AB (ref 12.0–15.0)
MCH: 30.5 pg (ref 26.0–34.0)
MCHC: 32.6 g/dL (ref 30.0–36.0)
MCV: 93.5 fL (ref 78.0–100.0)
Platelets: 348 10*3/uL (ref 150–400)
RBC: 2.79 MIL/uL — AB (ref 3.87–5.11)
RDW: 15.8 % — ABNORMAL HIGH (ref 11.5–15.5)
WBC: 8.7 10*3/uL (ref 4.0–10.5)

## 2015-07-20 LAB — GLUCOSE, CAPILLARY
GLUCOSE-CAPILLARY: 137 mg/dL — AB (ref 65–99)
Glucose-Capillary: 138 mg/dL — ABNORMAL HIGH (ref 65–99)
Glucose-Capillary: 127 mg/dL — ABNORMAL HIGH (ref 65–99)
Glucose-Capillary: 144 mg/dL — ABNORMAL HIGH (ref 65–99)

## 2015-07-20 LAB — TROPONIN I: TROPONIN I: 0.08 ng/mL — AB (ref <0.031)

## 2015-07-20 MED ORDER — LEVALBUTEROL HCL 0.63 MG/3ML IN NEBU
0.6300 mg | INHALATION_SOLUTION | Freq: Once | RESPIRATORY_TRACT | Status: AC
  Administered 2015-07-20: 0.63 mg via RESPIRATORY_TRACT

## 2015-07-20 MED ORDER — LEVALBUTEROL HCL 0.63 MG/3ML IN NEBU
INHALATION_SOLUTION | RESPIRATORY_TRACT | Status: AC
  Filled 2015-07-20: qty 3

## 2015-07-20 NOTE — Progress Notes (Signed)
Patient ID: Becky Patterson, female   DOB: 03-31-1956, 60 y.o.   MRN: [id number]   Patient ID: Becky Patterson, female   DOB: [birth year], 60 y.o.   MRN: [id number]  07/20/15.   Subjective/Complaints:  60 year old patient admitted for CIR with functional deficits secondary to Right MCA infarct with Left HP, Left neglect, Left hemisensory  Comfortable night; daughter at bedside ROS- No SOB/CP No abd pain, - N/V, no blood in stool, dark stools.  Some concern about a possible rectovaginal fistula  Past Medical History  Diagnosis Date  . Lung mass   . Bacterial endocarditis   . Splenic infarction   . Epistaxis     Past Surgical History  Procedure Laterality Date  . Back surgery    . Tee without cardioversion N/A 06/24/2015    Procedure: TRANSESOPHAGEAL ECHOCARDIOGRAM (TEE);  Surgeon: Larey Dresser, MD;  Location: [facility];  Service: Cardiovascular;  Laterality: N/A;  . Radiology with anesthesia N/A 06/29/2015    Procedure: RADIOLOGY WITH ANESTHESIA;  Surgeon: Luanne Bras, MD;  Location: [facility];  Service: Radiology;  Laterality: N/A;  . Cholecystectomy     Patient Vitals for the past 24 hrs:  BP Temp Temp src Pulse Resp SpO2 Weight  07/20/15 0404 115/64 mmHg 98.7 F (37.1 C) Oral 99 17 99 % -  07/19/15 1437 110/62 mmHg 98.7 F (37.1 C) Oral 98 18 100 % -  07/19/15 1210 - - - - - - 183 lb 4.8 oz (83.144 kg)     Intake/Output Summary (Last 24 hours) at 07/20/15 0836 Last data filed at 07/19/15 1700  Gross per 24 hour  Intake    240 ml  Output      0 ml  Net    240 ml    Objective: Vital Signs: Blood pressure 115/64, pulse 99, temperature 98.7 F (37.1 C), temperature source Oral, resp. rate 17, height '5\' 1"'$  (1.549 m), weight 183 lb 4.8 oz (83.144 kg), SpO2 99 %.  Results for orders placed or performed during the hospital encounter of 07/07/15 (from the past 72 hour(s))  Glucose, capillary     Status: Abnormal   Collection Time: 07/17/15 11:37 AM  Result Value Ref  Range   Glucose-Capillary 138 (H) 65 - 99 mg/dL  Glucose, capillary     Status: Abnormal   Collection Time: 07/17/15  4:44 PM  Result Value Ref Range   Glucose-Capillary 115 (H) 65 - 99 mg/dL  Glucose, capillary     Status: Abnormal   Collection Time: 07/17/15 10:00 PM  Result Value Ref Range   Glucose-Capillary 139 (H) 65 - 99 mg/dL  Glucose, capillary     Status: Abnormal   Collection Time: 07/18/15  6:40 AM  Result Value Ref Range   Glucose-Capillary 120 (H) 65 - 99 mg/dL  Glucose, capillary     Status: Abnormal   Collection Time: 07/18/15 12:18 PM  Result Value Ref Range   Glucose-Capillary 114 (H) 65 - 99 mg/dL   Comment 1 Notify RN   Glucose, capillary     Status: Abnormal   Collection Time: 07/18/15  4:28 PM  Result Value Ref Range   Glucose-Capillary 142 (H) 65 - 99 mg/dL  Glucose, capillary     Status: Abnormal   Collection Time: 07/18/15  8:57 PM  Result Value Ref Range   Glucose-Capillary 133 (H) 65 - 99 mg/dL  Glucose, capillary     Status: Abnormal   Collection Time: 07/19/15  6:42 AM  Result Value  Ref Range   Glucose-Capillary 118 (H) 65 - 99 mg/dL  Glucose, capillary     Status: Abnormal   Collection Time: 07/19/15 11:23 AM  Result Value Ref Range   Glucose-Capillary 148 (H) 65 - 99 mg/dL  Glucose, capillary     Status: Abnormal   Collection Time: 07/19/15  4:32 PM  Result Value Ref Range   Glucose-Capillary 127 (H) 65 - 99 mg/dL  Glucose, capillary     Status: Abnormal   Collection Time: 07/19/15  9:02 PM  Result Value Ref Range   Glucose-Capillary 129 (H) 65 - 99 mg/dL  CBC     Status: Abnormal   Collection Time: 07/20/15  5:11 AM  Result Value Ref Range   WBC 8.7 4.0 - 10.5 K/uL   RBC 2.79 (L) 3.87 - 5.11 MIL/uL   Hemoglobin 8.5 (L) 12.0 - 15.0 g/dL   HCT 26.1 (L) 36.0 - 46.0 %   MCV 93.5 78.0 - 100.0 fL   MCH 30.5 26.0 - 34.0 pg   MCHC 32.6 30.0 - 36.0 g/dL   RDW 15.8 (H) 11.5 - 15.5 %   Platelets 348 150 - 400 K/uL  Glucose, capillary      Status: Abnormal   Collection Time: 07/20/15  6:45 AM  Result Value Ref Range   Glucose-Capillary 138 (H) 65 - 99 mg/dL     HEENT: normal Cardio: RRR and Grade 2/6 systolic murmur Resp: CTA B/L and unlabored GI: BS positive and NT, ND Extremity:  No Edema Skin:   Intact and Other no heel breakdown, no evidence of rash Neuro: Lethargic, Confused, Flat, Cranial Nerve Abnormalities Left CN central 7, Abnormal Sensory reduced pinch sensation in LUE and LLE, Abnormal Motor 0/5 LUE and LLE, Tone:  clonus in Left ankle, Dysarthric and Inattention, left field cut Musc/Skel:  Left ankle and left wrist braces  Gen NAD   Medical Problem List and Plan: 1.  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 No antithrombotic needed.Cont Abx thru 3/7  - 2.  DVT Prophylaxis/Anticoagulation: Pharmaceutical: Lovenox 3. Pain Management: Has chronic back pain per family. Will use tylenol prn for now  -topamax for postroke headache  -tramadol in place of  Percocet due to  itching   4. Fluids/Electrolytes/Nutrition: Monitor I/O.upgraded to D3 nectar, 5. 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 PET scan on outpatient basis. 7.  Anemia- modest improvement    LOS (Days) 13 A FACE TO FACE EVALUATION WAS PERFORMED  Nyoka Cowden 07/20/2015, 8:36 AM

## 2015-07-20 NOTE — Significant Event (Signed)
Rapid Response Event Note  Overview: Time Called: 6195 Arrival Time: 1600 Event Type: Respiratory, Cardiac  Initial Focused Assessment:  Called by RN for patient with desats of 86% on ra and respiratory distress and HR 130's.  Upon my arrival to patients room, family and RN at bedside.  AS per patient she needed to go to the bathroom and then developed SOB.  Patient being cleaned up in bed on nasal cannula 2 LPM sats 96%. Patient is diaphoretic, appears anxious and  tachypneic at 26, 161/80, HR 126.  CBG 136. Patient states she is having an anxiety attack   Interventions:  Patient c/o not being able to breath, Breath Sounds wheezes in upper lobes b/l l>r, diminished at bases.  EKG done per protocol.  RT called for breathing treatment per protocol. Patient calming down breathing getting better. States she feels better   Event Summary:  Rn to call if assistance needed.  Recommend RN to call MD and update   at      at          [facility]

## 2015-07-20 NOTE — Progress Notes (Signed)
RN called to pt room during a incontinent episode and pt was feeling short of breath and anxious. Pt diaphoretic and O2 levels were at 86 on room air. RN initiated 2L O2 via nasal cannula and rapid response was notified. Pt saturations were 96 on O2. EKG was performed and respiratory was called to administer a breathing treatment. Pt still tachy but claiming to feel better. Pt is no longer diaphoretic and Respiratory is at bedside. Pt reports feeling better after breathing treatment. Vitals are stabalizing. B/P is 142/78, respirations are 20, pulse is 117, and O2 is 96 on 2L. Pt is with family at bedside. RN will continue to monitor pt for signs of increasing anxiety.

## 2015-07-21 ENCOUNTER — Inpatient Hospital Stay (HOSPITAL_COMMUNITY): Payer: 59

## 2015-07-21 ENCOUNTER — Inpatient Hospital Stay (HOSPITAL_COMMUNITY): Payer: 59 | Admitting: Speech Pathology

## 2015-07-21 ENCOUNTER — Inpatient Hospital Stay (HOSPITAL_COMMUNITY): Payer: 59 | Admitting: Physical Therapy

## 2015-07-21 ENCOUNTER — Inpatient Hospital Stay (HOSPITAL_COMMUNITY): Payer: 59 | Admitting: Occupational Therapy

## 2015-07-21 ENCOUNTER — Encounter (HOSPITAL_COMMUNITY): Payer: Self-pay | Admitting: Cardiology

## 2015-07-21 DIAGNOSIS — B9689 Other specified bacterial agents as the cause of diseases classified elsewhere: Secondary | ICD-10-CM

## 2015-07-21 DIAGNOSIS — I63411 Cerebral infarction due to embolism of right middle cerebral artery: Secondary | ICD-10-CM

## 2015-07-21 DIAGNOSIS — R0602 Shortness of breath: Secondary | ICD-10-CM

## 2015-07-21 DIAGNOSIS — I340 Nonrheumatic mitral (valve) insufficiency: Secondary | ICD-10-CM | POA: Insufficient documentation

## 2015-07-21 DIAGNOSIS — R700 Elevated erythrocyte sedimentation rate: Secondary | ICD-10-CM

## 2015-07-21 DIAGNOSIS — D735 Infarction of spleen: Secondary | ICD-10-CM

## 2015-07-21 DIAGNOSIS — R7982 Elevated C-reactive protein (CRP): Secondary | ICD-10-CM

## 2015-07-21 DIAGNOSIS — I339 Acute and subacute endocarditis, unspecified: Secondary | ICD-10-CM

## 2015-07-21 DIAGNOSIS — I76 Septic arterial embolism: Secondary | ICD-10-CM | POA: Insufficient documentation

## 2015-07-21 LAB — BASIC METABOLIC PANEL
Anion gap: 8 (ref 5–15)
BUN: 8 mg/dL (ref 6–20)
CHLORIDE: 100 mmol/L — AB (ref 101–111)
CO2: 26 mmol/L (ref 22–32)
CREATININE: 0.7 mg/dL (ref 0.44–1.00)
Calcium: 8.8 mg/dL — ABNORMAL LOW (ref 8.9–10.3)
GFR calc non Af Amer: >60 mL/min (ref >60)
Glucose, Bld: 129 mg/dL — ABNORMAL HIGH (ref 65–99)
Potassium: 3.3 mmol/L — ABNORMAL LOW (ref 3.5–5.1)
Sodium: 134 mmol/L — ABNORMAL LOW (ref 135–145)

## 2015-07-21 LAB — GLUCOSE, CAPILLARY
GLUCOSE-CAPILLARY: 126 mg/dL — AB (ref 65–99)
Glucose-Capillary: 117 mg/dL — ABNORMAL HIGH (ref 65–99)
Glucose-Capillary: 125 mg/dL — ABNORMAL HIGH (ref 65–99)
Glucose-Capillary: 145 mg/dL — ABNORMAL HIGH (ref 65–99)

## 2015-07-21 LAB — SEDIMENTATION RATE: Sed Rate: 102 mm/hr — ABNORMAL HIGH (ref 0–22)

## 2015-07-21 LAB — C-REACTIVE PROTEIN: CRP: 7.7 mg/dL — AB (ref <1.0)

## 2015-07-21 LAB — TROPONIN I
Troponin I: 0.09 ng/mL — ABNORMAL HIGH (ref <0.031)
Troponin I: 0.07 ng/mL — ABNORMAL HIGH (ref <0.031)

## 2015-07-21 MED ORDER — CHLORHEXIDINE GLUCONATE 0.12 % MT SOLN
OROMUCOSAL | Status: AC
  Filled 2015-07-21: qty 15

## 2015-07-21 NOTE — Progress Notes (Signed)
Occupational Therapy Session Note  Patient Details  Name: Becky Patterson MRN: [id number] Date of Birth: 06/06/1955  Today's Date: 07/21/2015 OT Individual Time: 9741-6384 OT Individual Time Calculation (min): 75 min    Short Term Goals: Week 2:  OT Short Term Goal 1 (Week 2): Pt will maintain static sitting balance EOB for 2 mins with close supervision in preparation for selfcare tasks.  OT Short Term Goal 2 (Week 2): Pt will complete UB bathing and dressing sitting supported with min assist. OT Short Term Goal 3 (Week 2): Pt will transfer squat pivot to the drop arm commode with max assist of 1 person.  OT Short Term Goal 4 (Week 2): Pt will locate all grooming and bathing items placed left of midline with no more than mod instructional cueing. OT Short Term Goal 5 (Week 2): Pt will use the LUE during bathing sessions with max assist.    Skilled Therapeutic Interventions/Progress Updates:      Therapy Documentation Precautions:  Precautions Precautions: Fall Precaution Comments: Lt neglect , poor trunk control, decreased awareness Restrictions Weight Bearing Restrictions: No Pain: left shoulder girdle pain greater with movement or ROM, but did not rate & did not voice need for meds.    This clinician worked the very, very tight left, upper back and some of the shoulder girdle muscles to help ease pain.   As well, this clinician spoke with nurse regarding the helpfulness of getting a KPad to help with left neck and left shoulder pain.    See Function Navigator for Current Functional Status.   Therapy/Group: Individual Therapy  Alfredia Ferguson [facility] 07/21/2015, 12:41 PM

## 2015-07-21 NOTE — Progress Notes (Signed)
Speech Language Pathology Daily Session Note  Patient Details  Name: Becky Patterson MRN: [id number] Date of Birth: 1956-01-23  Today's Date: 07/21/2015 SLP Individual Time: 1100-1130; 1300-1400 SLP Individual Time Calculation (min): 30 min; 60 min   Short Term Goals: Week 2: SLP Short Term Goal 1 (Week 2): Pt will attend to the left in >75% of observable opportunities with max assist verbal cues.   SLP Short Term Goal 2 (Week 2): Pt will sustain her attention to basic familiar tasks for 3-5 minute intervals with mod assist verbal cues for redirection.  SLP Short Term Goal 3 (Week 2): Pt will consume dys 3 textures and nectar thick liquids with min verbal cues for use of swallowing precautions.   SLP Short Term Goal 4 (Week 2): Pt willl consume trials of water via cup sips as part of the water protocol with minimal overt s/s of aspiration for 3 consecutive sessions with min verbal cues for use of swallowing precautions.  SLP Short Term Goal 5 (Week 2): Pt will identify at least 2 deficits occurring s/p CVA with mod question cues.    Skilled Therapeutic Interventions:  Session 1: Pt was seen for skilled ST targeting cognitive goals.  Per chart review, pt had rapid response event over the weekend due to tachypnea with O2 desaturations into the 80's, new chest x ray with findings of increasing right medial lung base infiltrate. Per RN, PA, and MD notes no worsening changes in comparison to previous chest x rays, question cardiac involvement, lung sounds clear to auscultation with plans for echocardiogram this afternoon. Will address dysphagia in PM therapy session.  SLP facilitated the session with a basic sorting task targeting visual scanning to the left of midline and sustained attention to task.  Pt was able to organize playing cards into groups of 6 by shape with min assist verbal cues to attend to cards on her left side.  Pt was able to sustain her attention to task for ~5-7 minute intervals with  min verbal cues for redirection.  Initiation appears subjectively improved in comparison to previous therapy sessions.  Pt was returned to room and left in wheelchair with quick release belt donned and call bell left within reach.  Continue per current plan of care.    Session 2:  Pt was seen for skilled ST targeting goals for dysphagia and cognition.  Pt noted with congested cough prior to and well after PO intake which family reports to be related to sinus congestion and post nasal drip.  No overt s/s of aspiration evident with cup sips of nectar thick liquids with min verbal cues for rate and portion control. Pt declined presentations of solids due to just finishing lunch.  SLP recommends that pt remain on dys 3 textures, nectar thick liquids with trials of regular, unthickened water per the water protocol.    SLP also facilitated the session with a novel card game targeting sustained attention to task, functional problem solving, and visual scanning to the left of midline.  Pt located cards to the left of midline with min verbal cues, mod verbal and visual cues were needed to plan and execute a problem solving strategy.  Pt was able to sustain her attention to task for 10-15 minutes with min verbal cues for redirection.  Pt was left in bed with husband at bedside.  Continue per current plan of care.    Function:  Eating Eating   Modified Consistency Diet: Yes Eating Assist Level: Supervision or verbal  cues   Eating Set Up Assist For: Opening containers       Cognition Comprehension Comprehension assist level: Understands basic 75 - 89% of the time/ requires cueing 10 - 24% of the time  Expression   Expression assist level: Expresses basic 75 - 89% of the time/requires cueing 10 - 24% of the time. Needs helper to occlude trach/needs to repeat words.  Social Interaction Social Interaction assist level: Interacts appropriately 25 - 49% of time - Needs frequent redirection.  Problem Solving  Problem solving assist level: Solves basic 25 - 49% of the time - needs direction more than half the time to initiate, plan or complete simple activities  Memory Memory assist level: Recognizes or recalls 25 - 49% of the time/requires cueing 50 - 75% of the time    Pain Pain Assessment Pain Assessment: No/denies pain  Therapy/Group: Individual Therapy  Jacoba Cherney, Selinda Orion 07/21/2015, 3:43 PM

## 2015-07-21 NOTE — Progress Notes (Signed)
VASCULAR LAB PRELIMINARY  PRELIMINARY  PRELIMINARY  PRELIMINARY  Bilateral lower extremity venous duplex completed.    Preliminary report:  Bilateral:  No evidence of DVT, superficial thrombosis, or Baker's Cyst.   Hitomi Slape, RVS 07/21/2015, 5:02 PM

## 2015-07-21 NOTE — Progress Notes (Signed)
  Echocardiogram 2D Echocardiogram has been performed.  Diamond Nickel 07/21/2015, 12:18 PM

## 2015-07-21 NOTE — Consult Note (Signed)
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 Letta Pate   HPI: Becky Patterson is an 60 y.o. 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 She has been on IV ceftriaxone with plans to complete 56 days of therapy. We were consulted to see the patient as she  Is nearing the end of her course of therapy. She says she has not regained much strength and continues to suffer from headaches though now occuring with less frequency.   Past Medical History  Diagnosis Date  . Lung mass   . Bacterial endocarditis   . Splenic infarction   . Epistaxis     Past Surgical History  Procedure Laterality Date  . Back surgery    . Tee without cardioversion N/A 06/24/2015    Procedure: TRANSESOPHAGEAL ECHOCARDIOGRAM (TEE);  Surgeon: Larey Dresser, MD;  Location: [place];  Service: Cardiovascular;  Laterality: N/A;  . Radiology with anesthesia N/A 06/29/2015    Procedure: RADIOLOGY WITH ANESTHESIA;  Surgeon: Luanne Bras, MD;  Location: [place];  Service: Radiology;  Laterality: N/A;  . Cholecystectomy      Social History:  reports that she has been smoking.  She does not have any smokeless tobacco history on file. She reports that she does not drink alcohol or use illicit drugs.   Family History  Problem Relation Age of Onset  . Lung cancer Mother   . Liver cancer Brother   . Heart attack Brother     Allergies  Allergen Reactions  . Codeine Nausea And Vomiting     Medications: I have reviewed patients current medications as documented in Epic Anti-infectives    Start     Dose/Rate Route Frequency Ordered Stop   07/07/15 2100  cefTRIAXone (ROCEPHIN) 2 g in dextrose 5 % 50 mL IVPB     2 g 100 mL/hr over 30 Minutes Intravenous Every 12 hours  07/07/15 1605     07/07/15 1715  fluconazole (DIFLUCAN) tablet 150 mg     150 mg Oral  Once 07/07/15 1709 07/07/15 1733         ROS:  as in HPI otherwise remainder of 12 point Review of Systems is negative   Blood pressure 109/68, pulse 101, temperature 98.3 F (36.8 C), temperature source Oral, resp. rate 18, height _0  (1.549 m), weight 183 lb 4.8 oz (83.144 kg), SpO2 97 %. General: Alert and awake, oriented x3, not in any acute distress. HEENT: anicteric sclera,  EOMI, oropharynx clear and without exudate Cardiovascular: rr, loud 3/6 systolic murmur loudest at PMI with radiation to axilla Pulmonary: clear to auscultation bilaterally, no wheezing, rales or rhonchi Gastrointestinal: soft nontender, nondistended, normal bowel sounds, Musculoskeletal: no  clubbing or edema noted bilaterally Skin,: no rashes Neuro: left sided hemiparesis   Results for orders placed or performed during the hospital encounter of 07/07/15 (from the past 48 hour(s))  Glucose, capillary     Status: Abnormal   Collection Time: 07/19/15  9:02 PM  Result Value Ref Range   Glucose-Capillary 129 (H) 65 - 99 mg/dL  CBC     Status: Abnormal   Collection Time: 07/20/15  5:11 AM  Result Value Ref Range   WBC 8.7 4.0 - 10.5 K/uL   RBC 2.79 (L) 3.87 - 5.11 MIL/uL  Hemoglobin 8.5 (L) 12.0 - 15.0 g/dL   HCT 26.1 (L) 36.0 - 46.0 %   MCV 93.5 78.0 - 100.0 fL   MCH 30.5 26.0 - 34.0 pg   MCHC 32.6 30.0 - 36.0 g/dL   RDW 15.8 (H) 11.5 - 15.5 %   Platelets 348 150 - 400 K/uL  Glucose, capillary     Status: Abnormal   Collection Time: 07/20/15  6:45 AM  Result Value Ref Range   Glucose-Capillary 138 (H) 65 - 99 mg/dL  Glucose, capillary     Status: Abnormal   Collection Time: 07/20/15 11:53 AM  Result Value Ref Range   Glucose-Capillary 127 (H) 65 - 99 mg/dL  Glucose, capillary     Status: Abnormal   Collection Time: 07/20/15  4:11 PM  Result Value Ref Range   Glucose-Capillary 137 (H) 65 - 99 mg/dL    Troponin I (q 6hr x 3)     Status: Abnormal   Collection Time: 07/20/15  6:20 PM  Result Value Ref Range   Troponin I 0.08 (H) <0.031 ng/mL    Comment:        PERSISTENTLY INCREASED TROPONIN VALUES IN THE RANGE OF 0.04-0.49 ng/mL CAN BE SEEN IN:       -UNSTABLE ANGINA       -CONGESTIVE HEART FAILURE       -MYOCARDITIS       -CHEST TRAUMA       -ARRYHTHMIAS       -LATE PRESENTING MYOCARDIAL INFARCTION       -COPD   CLINICAL FOLLOW-UP RECOMMENDED.   Glucose, capillary     Status: Abnormal   Collection Time: 07/20/15  9:04 PM  Result Value Ref Range   Glucose-Capillary 144 (H) 65 - 99 mg/dL  Troponin I (q 6hr x 3)     Status: Abnormal   Collection Time: 07/20/15 11:17 PM  Result Value Ref Range   Troponin I 0.09 (H) <0.031 ng/mL    Comment:        PERSISTENTLY INCREASED TROPONIN VALUES IN THE RANGE OF 0.04-0.49 ng/mL CAN BE SEEN IN:       -UNSTABLE ANGINA       -CONGESTIVE HEART FAILURE       -MYOCARDITIS       -CHEST TRAUMA       -ARRYHTHMIAS       -LATE PRESENTING MYOCARDIAL INFARCTION       -COPD   CLINICAL FOLLOW-UP RECOMMENDED.   Troponin I (q 6hr x 3)     Status: Abnormal   Collection Time: 07/21/15  5:13 AM  Result Value Ref Range   Troponin I 0.07 (H) <0.031 ng/mL    Comment:        PERSISTENTLY INCREASED TROPONIN VALUES IN THE RANGE OF 0.04-0.49 ng/mL CAN BE SEEN IN:       -UNSTABLE ANGINA       -CONGESTIVE HEART FAILURE       -MYOCARDITIS       -CHEST TRAUMA       -ARRYHTHMIAS       -LATE PRESENTING MYOCARDIAL INFARCTION       -COPD   CLINICAL FOLLOW-UP RECOMMENDED.   Basic metabolic panel     Status: Abnormal   Collection Time: 07/21/15  5:13 AM  Result Value Ref Range   Sodium 134 (L) 135 - 145 mmol/L   Potassium 3.3 (L) 3.5 - 5.1 mmol/L   Chloride 100 (L) 101 - 111 mmol/L   CO2 26 22 -  32 mmol/L   Glucose, Bld 129 (H) 65 - 99 mg/dL   BUN 8 6 - 20 mg/dL   Creatinine, Ser 0.70 0.44 - 1.00 mg/dL   Calcium 8.8 (L) 8.9 - 10.3 mg/dL   GFR calc  non Af Amer >60 >60 mL/min   GFR calc Af Amer >60 >60 mL/min    Comment: (NOTE) The eGFR has been calculated using the CKD EPI equation. This calculation has not been validated in all clinical situations. eGFR's persistently <60 mL/min signify possible Chronic Kidney Disease.    Anion gap 8 5 - 15  Glucose, capillary     Status: Abnormal   Collection Time: 07/21/15  6:56 AM  Result Value Ref Range   Glucose-Capillary 117 (H) 65 - 99 mg/dL  Glucose, capillary     Status: Abnormal   Collection Time: 07/21/15 12:33 PM  Result Value Ref Range   Glucose-Capillary 125 (H) 65 - 99 mg/dL   Comment 1 Notify RN   Sedimentation rate     Status: Abnormal   Collection Time: 07/21/15 12:58 PM  Result Value Ref Range   Sed Rate 102 (H) 0 - 22 mm/hr  C-reactive protein     Status: Abnormal   Collection Time: 07/21/15 12:58 PM  Result Value Ref Range   CRP 7.7 (H) <1.0 mg/dL  Glucose, capillary     Status: Abnormal   Collection Time: 07/21/15  4:11 PM  Result Value Ref Range   Glucose-Capillary 126 (H) 65 - 99 mg/dL   Comment 1 Notify RN   Culture, blood (Routine X 2) w Reflex to ID Panel     Status: None (Preliminary result)   Collection Time: 07/21/15  6:28 PM  Result Value Ref Range   Specimen Description BLOOD LEFT FOREARM    Special Requests BOTTLES DRAWN AEROBIC AND ANAEROBIC 5CC    Culture PENDING    Report Status PENDING    _0 (sdes,specrequest,cult,reptstatus)   ) Recent Results (from the past 720 hour(s))  Gastrointestinal Panel by PCR , Stool     Status: None   Collection Time: 06/22/15  5:41 AM  Result Value Ref Range Status   Campylobacter species NOT DETECTED NOT DETECTED Final   Plesimonas shigelloides NOT DETECTED NOT DETECTED Final   Salmonella species NOT DETECTED NOT DETECTED Final   Yersinia enterocolitica NOT DETECTED NOT DETECTED Final   Vibrio species NOT DETECTED NOT DETECTED Final   Vibrio cholerae NOT DETECTED NOT DETECTED Final    Enteroaggregative E coli (EAEC) NOT DETECTED NOT DETECTED Final   Enteropathogenic E coli (EPEC) NOT DETECTED NOT DETECTED Final   Enterotoxigenic E coli (ETEC) NOT DETECTED NOT DETECTED Final   Shiga like toxin producing E coli (STEC) NOT DETECTED NOT DETECTED Final   E. coli O157 NOT DETECTED NOT DETECTED Final   Shigella/Enteroinvasive E coli (EIEC) NOT DETECTED NOT DETECTED Final   Cryptosporidium NOT DETECTED NOT DETECTED Final   Cyclospora cayetanensis NOT DETECTED NOT DETECTED Final   Entamoeba histolytica NOT DETECTED NOT DETECTED Final   Giardia lamblia NOT DETECTED NOT DETECTED Final   Adenovirus F40/41 NOT DETECTED NOT DETECTED Final   Astrovirus NOT DETECTED NOT DETECTED Final   Norovirus GI/GII NOT DETECTED NOT DETECTED Final   Rotavirus A NOT DETECTED NOT DETECTED Final   Sapovirus (I, II, IV, and V) NOT DETECTED NOT DETECTED Final  MRSA PCR Screening     Status: None   Collection Time: 06/29/15  3:54 PM  Result Value Ref Range Status  MRSA by PCR NEGATIVE NEGATIVE Final    Comment:        The GeneXpert MRSA Assay (FDA approved for NASAL specimens only), is one component of a comprehensive MRSA colonization surveillance program. It is not intended to diagnose MRSA infection nor to guide or monitor treatment for MRSA infections.   Culture, blood (Routine X 2) w Reflex to ID Panel     Status: None   Collection Time: 06/29/15  4:25 PM  Result Value Ref Range Status   Specimen Description BLOOD RIGHT ARM  Final   Special Requests BOTTLES DRAWN AEROBIC AND ANAEROBIC 10CC  Final   Culture NO GROWTH 5 DAYS  Final   Report Status 07/04/2015 FINAL  Final  Culture, blood (Routine X 2) w Reflex to ID Panel     Status: None   Collection Time: 06/29/15  4:35 PM  Result Value Ref Range Status   Specimen Description BLOOD RIGHT HAND  Final   Special Requests BOTTLES DRAWN AEROBIC ONLY [place]  Final   Culture NO GROWTH 5 DAYS  Final   Report Status 07/04/2015 FINAL  Final    Culture, blood (Routine X 2) w Reflex to ID Panel     Status: None (Preliminary result)   Collection Time: 07/21/15  6:28 PM  Result Value Ref Range Status   Specimen Description BLOOD LEFT FOREARM  Final   Special Requests BOTTLES DRAWN AEROBIC AND ANAEROBIC 5CC  Final   Culture PENDING  Incomplete   Report Status PENDING  Incomplete     Impression/Recommendation  Active Problems:   Acute right MCA stroke (HCC)   Intracranial septic embolism (HCC)   Hemiplegia affecting left nondominant side (HCC)   Dysphagia   Alteration of sensation as late effect of stroke   Dehydration   Bacterial endocarditis   Maculopapular rash   Acute blood loss anemia   Other vascular headache   Becky Patterson is a 60 y.o. 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 On exam today she has loud murmur. TTE shows now severe Mitral regurgitation. 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 Marlou Porch with Cardiology  Dr. Marlou Porch was going to formally discuss with CT surgery  We agree that the patient is not likely going to be a surgical candidate  We can consider TEE but will need to be careful since apparently she was not adequetely sedated for the TEE the last time.   If she is not going to be a surgical candidate as if she is not the TEE will not change surgical management  #2 HA: less is frequency. Consider repeate MRI but not sure I would push as I am also not sure this will change management at this point.      07/21/2015, 7:27 PM   Thank you so much for this interesting consult  [place] for [place] [phone] (pager) [phone] (office) 07/21/2015, 7:27 PM  Rhina Brackett [place] 07/21/2015, 7:27 PM

## 2015-07-21 NOTE — Progress Notes (Addendum)
Subjective/Complaints: Poor fluid intake , 25-60% meals Sister states that there is some unrecorded fluid intake ROS- No SOB/CP No abd pain, - N/V, no blood in stool, dark stools  Objective: Vital Signs: Blood pressure 114/69, pulse 98, temperature 98.3 F (36.8 C), temperature source Oral, resp. rate 19, height '5\' 1"'  (1.549 m), weight 83.144 kg (183 lb 4.8 oz), SpO2 98 %. Dg Swallowing Func-speech Pathology  07/15/2015  Objective Swallowing Evaluation: Type of Study: MBS-Modified Barium Swallow Study Patient Details Name: EMBERLEE SORTINO MRN: [id number] Date of Birth: [birth year] Today's Date: 07/15/2015 Time: SLP Start Time (ACUTE ONLY): 1340 SLP Stop Time (ACUTE ONLY): 1410 SLP Time Calculation (min) (ACUTE ONLY): 30 min Past Medical History: Past Medical History Diagnosis Date . Lung mass  . Bacterial endocarditis  . Splenic infarction  . Epistaxis  Past Surgical History: Past Surgical History Procedure Laterality Date . Back surgery   . Tee without cardioversion N/A 06/24/2015   Procedure: TRANSESOPHAGEAL ECHOCARDIOGRAM (TEE);  Surgeon: Larey Dresser, MD;  Location: [facility];  Service: Cardiovascular;  Laterality: N/A; . Radiology with anesthesia N/A 06/29/2015   Procedure: RADIOLOGY WITH ANESTHESIA;  Surgeon: Luanne Bras, MD;  Location: [facility];  Service: Radiology;  Laterality: N/A; . Cholecystectomy   HPI: CHELCY BOLDA is a 60 y.o. female history of infective endocarditis that was treated with discharge on 2/2, continuing on antibiotics at home, lung mass noted on that admission. Re-admitted with left sided weakness that progressed after admission. 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  Pt admitted to CIR on 07/07/15.  Tolerating Dys 3, honey thick liquids diet.  Repeat MBS ordered to objectively determine readiness to advance given improvements noted at bedside.  No Data Recorded Assessment / Plan / Recommendation CHL IP CLINICAL IMPRESSIONS 07/15/2015 Therapy  Diagnosis Mild pharyngeal phase dysphagia;Mild oral phase dysphagia Clinical Impression Pt presents with improved swallowing function and now presents with a mild oropharyngeal dysphagia with sensory>motor deficits.  Pt presents with left sided labial and lingual weakness which results in anterior loss of boluses, impaired mastication of solids, and prolonged posterior transit of boluses to the oropharynx.  Decreased pharyngeal sensation and base of tongue weakness resulted in poor bolus cohesion and premature spillage of materials into the pharynx with swallow response triggered at the vallecula with thickened liquids and solids, delayed to the pyriforms with thin liquids.  Delay in swallow initiation resulted in penetration of large boluses of nectar thick liquids and thin liquids.  Penetration of thin liquids was greater in amount and occurred deeper into the laryngeal vestibule, requiring increased cues for throat clear and extra swallows to clear.  Chin tuck ineffective largely due to cognitive deficits impacting pt's ability to successfully time strategy during the swallow.   Given pt's significant cognitive deficits, limited mobility, and persistent lethargy, recommend conservative advancement to Dys 3, nectar thick liquids with full supervision for use of swallowing precautions.  Pt would also benefit from implementation of the water protocol to continue working towards liquids advancement.  Prognosis for advancement good given progress made thus far with continued ST interventions for trials of advanced consistencies and use of compensatory swallowing strategies.   Impact on safety and function Mild aspiration risk     Prognosis 07/03/2015 Prognosis for Safe Diet Advancement Good Barriers to Reach Goals -- Barriers/Prognosis Comment -- CHL IP DIET RECOMMENDATION 07/15/2015 SLP Diet Recommendations Dysphagia 3 (Mech soft) solids;Nectar thick liquid Liquid Administration via Cup;No straw Medication  Administration Crushed with puree Compensations  Slow rate;Small sips/bites;Lingual sweep for clearance of pocketing Postural Changes Seated upright at 90 degrees            CHL IP ORAL PHASE 07/15/2015 Oral Phase Impaired Oral - Pudding Teaspoon -- Oral - Pudding Cup -- Oral - Honey Teaspoon -- Oral - Honey Cup Left anterior bolus loss;Delayed oral transit;Decreased bolus cohesion;Premature spillage;Lingual/palatal residue Oral - Nectar Teaspoon -- Oral - Nectar Cup Left anterior bolus loss;Delayed oral transit;Lingual/palatal residue;Decreased bolus cohesion;Premature spillage Oral - Nectar Straw Lingual/palatal residue;Decreased bolus cohesion;Premature spillage Oral - Thin Teaspoon -- Oral - Thin Cup Left anterior bolus loss;Lingual/palatal residue;Delayed oral transit;Decreased bolus cohesion;Premature spillage Oral - Thin Straw Lingual/palatal residue;Decreased bolus cohesion;Premature spillage Oral - Puree Lingual/palatal residue;Delayed oral transit;Premature spillage Oral - Mech Soft Lingual/palatal residue;Premature spillage;Delayed oral transit;Impaired mastication Oral - Regular -- Oral - Multi-Consistency -- Oral - Pill -- Oral Phase - Comment --  CHL IP PHARYNGEAL PHASE 07/15/2015 Pharyngeal Phase Impaired Pharyngeal- Pudding Teaspoon -- Pharyngeal -- Pharyngeal- Pudding Cup -- Pharyngeal -- Pharyngeal- Honey Teaspoon -- Pharyngeal -- Pharyngeal- Honey Cup Delayed swallow initiation-vallecula;Reduced tongue base retraction;Pharyngeal residue - valleculae Pharyngeal -- Pharyngeal- Nectar Teaspoon -- Pharyngeal -- Pharyngeal- Nectar Cup Delayed swallow initiation-vallecula;Reduced tongue base retraction;Pharyngeal residue - valleculae;Penetration/Aspiration during swallow Pharyngeal Material enters airway, remains ABOVE vocal cords then ejected out Pharyngeal- Nectar Straw -- Pharyngeal -- Pharyngeal- Thin Teaspoon -- Pharyngeal -- Pharyngeal- Thin Cup Delayed swallow initiation-pyriform sinuses;Reduced  tongue base retraction;Penetration/Aspiration during swallow;Pharyngeal residue - valleculae;Pharyngeal residue - pyriform Pharyngeal Material enters airway, remains ABOVE vocal cords and not ejected out Pharyngeal- Thin Straw Delayed swallow initiation-pyriform sinuses;Reduced tongue base retraction;Penetration/Aspiration during swallow;Pharyngeal residue - valleculae;Pharyngeal residue - pyriform Pharyngeal Material enters airway, remains ABOVE vocal cords and not ejected out Pharyngeal- Puree Delayed swallow initiation-vallecula;Reduced tongue base retraction Pharyngeal -- Pharyngeal- Mechanical Soft Delayed swallow initiation-vallecula;Reduced tongue base retraction Pharyngeal -- Pharyngeal- Regular -- Pharyngeal -- Pharyngeal- Multi-consistency -- Pharyngeal -- Pharyngeal- Pill -- Pharyngeal -- Pharyngeal Comment --  CHL IP CERVICAL ESOPHAGEAL PHASE 07/03/2015 Cervical Esophageal Phase WFL Pudding Teaspoon -- Pudding Cup -- Honey Teaspoon -- Honey Cup -- Nectar Teaspoon -- Nectar Cup -- Nectar Straw -- Thin Teaspoon -- Thin Cup -- Thin Straw -- Puree -- Mechanical Soft -- Regular -- Multi-consistency -- Pill -- Cervical Esophageal Comment -- No flowsheet data found. Page, Selinda Orion 07/15/2015, 4:17 PM              Results for orders placed or performed during the hospital encounter of 07/07/15 (from the past 72 hour(s))  Glucose, capillary     Status: Abnormal   Collection Time: 07/18/15 12:18 PM  Result Value Ref Range   Glucose-Capillary 114 (H) 65 - 99 mg/dL   Comment 1 Notify RN   Glucose, capillary     Status: Abnormal   Collection Time: 07/18/15  4:28 PM  Result Value Ref Range   Glucose-Capillary 142 (H) 65 - 99 mg/dL  Glucose, capillary     Status: Abnormal   Collection Time: 07/18/15  8:57 PM  Result Value Ref Range   Glucose-Capillary 133 (H) 65 - 99 mg/dL  Glucose, capillary     Status: Abnormal   Collection Time: 07/19/15  6:42 AM  Result Value Ref Range   Glucose-Capillary 118 (H) 65  - 99 mg/dL  Glucose, capillary     Status: Abnormal   Collection Time: 07/19/15 11:23 AM  Result Value Ref Range   Glucose-Capillary 148 (H) 65 - 99 mg/dL  Glucose, capillary  Status: Abnormal   Collection Time: 07/19/15  4:32 PM  Result Value Ref Range   Glucose-Capillary 127 (H) 65 - 99 mg/dL  Glucose, capillary     Status: Abnormal   Collection Time: 07/19/15  9:02 PM  Result Value Ref Range   Glucose-Capillary 129 (H) 65 - 99 mg/dL  CBC     Status: Abnormal   Collection Time: 07/20/15  5:11 AM  Result Value Ref Range   WBC 8.7 4.0 - 10.5 K/uL   RBC 2.79 (L) 3.87 - 5.11 MIL/uL   Hemoglobin 8.5 (L) 12.0 - 15.0 g/dL   HCT 26.1 (L) 36.0 - 46.0 %   MCV 93.5 78.0 - 100.0 fL   MCH 30.5 26.0 - 34.0 pg   MCHC 32.6 30.0 - 36.0 g/dL   RDW 15.8 (H) 11.5 - 15.5 %   Platelets 348 150 - 400 K/uL  Glucose, capillary     Status: Abnormal   Collection Time: 07/20/15  6:45 AM  Result Value Ref Range   Glucose-Capillary 138 (H) 65 - 99 mg/dL  Glucose, capillary     Status: Abnormal   Collection Time: 07/20/15 11:53 AM  Result Value Ref Range   Glucose-Capillary 127 (H) 65 - 99 mg/dL  Glucose, capillary     Status: Abnormal   Collection Time: 07/20/15  4:11 PM  Result Value Ref Range   Glucose-Capillary 137 (H) 65 - 99 mg/dL  Troponin I (q 6hr x 3)     Status: Abnormal   Collection Time: 07/20/15  6:20 PM  Result Value Ref Range   Troponin I 0.08 (H) <0.031 ng/mL    Comment:        PERSISTENTLY INCREASED TROPONIN VALUES IN THE RANGE OF 0.04-0.49 ng/mL CAN BE SEEN IN:       -UNSTABLE ANGINA       -CONGESTIVE HEART FAILURE       -MYOCARDITIS       -CHEST TRAUMA       -ARRYHTHMIAS       -LATE PRESENTING MYOCARDIAL INFARCTION       -COPD   CLINICAL FOLLOW-UP RECOMMENDED.   Glucose, capillary     Status: Abnormal   Collection Time: 07/20/15  9:04 PM  Result Value Ref Range   Glucose-Capillary 144 (H) 65 - 99 mg/dL  Troponin I (q 6hr x 3)     Status: Abnormal   Collection Time:  07/20/15 11:17 PM  Result Value Ref Range   Troponin I 0.09 (H) <0.031 ng/mL    Comment:        PERSISTENTLY INCREASED TROPONIN VALUES IN THE RANGE OF 0.04-0.49 ng/mL CAN BE SEEN IN:       -UNSTABLE ANGINA       -CONGESTIVE HEART FAILURE       -MYOCARDITIS       -CHEST TRAUMA       -ARRYHTHMIAS       -LATE PRESENTING MYOCARDIAL INFARCTION       -COPD   CLINICAL FOLLOW-UP RECOMMENDED.   Troponin I (q 6hr x 3)     Status: Abnormal   Collection Time: 07/21/15  5:13 AM  Result Value Ref Range   Troponin I 0.07 (H) <0.031 ng/mL    Comment:        PERSISTENTLY INCREASED TROPONIN VALUES IN THE RANGE OF 0.04-0.49 ng/mL CAN BE SEEN IN:       -UNSTABLE ANGINA       -CONGESTIVE HEART FAILURE       -MYOCARDITIS       -  CHEST TRAUMA       -ARRYHTHMIAS       -LATE PRESENTING MYOCARDIAL INFARCTION       -COPD   CLINICAL FOLLOW-UP RECOMMENDED.   Basic metabolic panel     Status: Abnormal   Collection Time: 07/21/15  5:13 AM  Result Value Ref Range   Sodium 134 (L) 135 - 145 mmol/L   Potassium 3.3 (L) 3.5 - 5.1 mmol/L   Chloride 100 (L) 101 - 111 mmol/L   CO2 26 22 - 32 mmol/L   Glucose, Bld 129 (H) 65 - 99 mg/dL   BUN 8 6 - 20 mg/dL   Creatinine, Ser 0.70 0.44 - 1.00 mg/dL   Calcium 8.8 (L) 8.9 - 10.3 mg/dL   GFR calc non Af Amer >60 >60 mL/min   GFR calc Af Amer >60 >60 mL/min    Comment: (NOTE) The eGFR has been calculated using the CKD EPI equation. This calculation has not been validated in all clinical situations. eGFR's persistently <60 mL/min signify possible Chronic Kidney Disease.    Anion gap 8 5 - 15  Glucose, capillary     Status: Abnormal   Collection Time: 07/21/15  6:56 AM  Result Value Ref Range   Glucose-Capillary 117 (H) 65 - 99 mg/dL     HEENT: normal Cardio: RRR and no murmur Resp: CTA B/L and unlabored GI: BS positive and NT, ND Extremity:  No Edema Skin:   Intact and Other no heel breakdown, no evidence of rash Neuro: Lethargic, Confused, Flat,  Cranial Nerve Abnormalities Left CN central 7, Abnormal Sensory reduced pinch sensation in LUE and LLE, Abnormal Motor 0/5 LUE and LLE, Tone:  clonus in Left ankle, Dysarthric and Inattention, left field cut Musc/Skel:  Other no pain with LUE or LLE ROM, tenderness over the R trap. Left heel cord tight as well as finger flexors Gen NAD   Assessment/Plan: 1. Functional deficits secondary to Right MCA infarct with Left HP, Left neglect, Left hemisensory deficits which require 3+ hours per day of interdisciplinary therapy in a comprehensive inpatient rehab setting. Physiatrist is providing close team supervision and 24 hour management of active medical problems listed below. Physiatrist and rehab team continue to assess barriers to discharge/monitor patient progress toward functional and medical goals. FIM: Function - Bathing Position:  (EOB and supine) Body parts bathed by patient: Right upper leg, Left upper leg Body parts bathed by helper: Buttocks, Right lower leg, Left lower leg Bathing not applicable: Right arm, Left arm, Chest, Abdomen, Front perineal area, Back (Pt declined UB bathing) Assist Level: 2 helpers  Function- Upper Body Dressing/Undressing What is the patient wearing?: Pull over shirt/dress Pull over shirt/dress - Perfomed by patient: Thread/unthread right sleeve Pull over shirt/dress - Perfomed by helper: Put head through opening, Thread/unthread left sleeve, Pull shirt over trunk Assist Level: Touching or steadying assistance(Pt > 75%) (modA) Function - Lower Body Dressing/Undressing What is the patient wearing?: Pants, Non-skid slipper socks Position: Bed Pants- Performed by patient: Thread/unthread right pants leg Pants- Performed by helper: Thread/unthread left pants leg, Pull pants up/down, Thread/unthread right pants leg Non-skid slipper socks- Performed by helper: Don/doff right sock, Don/doff left sock Socks - Performed by helper: Don/doff right sock, Don/doff  left sock Shoes - Performed by patient: Don/doff right shoe Shoes - Performed by helper: Don/doff right shoe, Don/doff left shoe, Fasten right, Fasten left Assist for footwear: Dependant Assist for lower body dressing: 2 Helpers  Function - Toileting Toileting activity did not occur: No  continent bowel/bladder event Assist level: Two helpers  Function - Air cabin crew transfer activity did not occur: Safety/medical concerns Assist level to toilet: 2 helpers Assist level from toilet: 2 helpers  Function - Chair/bed transfer Chair/bed transfer [place]: Lateral scoot Chair/bed transfer assist level: 2 helpers Chair/bed transfer assistive device: Sliding board, Armrests Mechanical lift: [place] transfer details: Verbal cues for sequencing, Verbal cues for technique, Manual facilitation for weight shifting, Manual facilitation for placement, Manual facilitation for weight bearing  Function - Locomotion: Wheelchair Will patient use wheelchair at discharge?: Yes Type: Manual (TIS) Max wheelchair distance: 150 Assist Level: Dependent (Pt equals 0%) Assist Level: Dependent (Pt equals 0%) Assist Level: Dependent (Pt equals 0%) Function - Locomotion: Ambulation Ambulation activity did not occur: Safety/medical concerns  Function - Comprehension Comprehension: Auditory Comprehension assist level: Understands basic 75 - 89% of the time/ requires cueing 10 - 24% of the time  Function - Expression Expression: Verbal Expression assist level: Expresses basic 75 - 89% of the time/requires cueing 10 - 24% of the time. Needs helper to occlude trach/needs to repeat words.  Function - Social Interaction Social Interaction assist level: Interacts appropriately 25 - 49% of time - Needs frequent redirection.  Function - Problem Solving Problem solving assist level: Solves basic 25 - 49% of the time - needs direction more than half the time to initiate, plan or complete simple  activities  Function - Memory Memory assist level: Recognizes or recalls 25 - 49% of the time/requires cueing 50 - 75% of the time Patient normally able to recall (first 3 days only): Current season  Medical Problem List and Plan: 1.  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 No antithrombotic needed.Cont Abx thru 3/7  - 2.  DVT Prophylaxis/Anticoagulation: Pharmaceutical: Lovenox 3. Pain Management: Has chronic back pain per family. Will use tylenol prn for now  -topamax for postroke headache  -tramadol in place of  Percocet due to  itching  -overall improvement in sleep/headache 4. Mood: LCSW to follow for evaluation and support.   5. Neuropsych: This patient is not fully capable of making decisions on her own behalf. 6. Skin/Wound Care: routine pressure relief measures.   7. Fluids/Electrolytes/Nutrition: Monitor I/O.upgraded to D3 nectar, will cont Megace, D/C topamax dose    Getting dehydrated with honey liquids.   IVF at nights and need to encourage fluid intake during the day. 8. 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 PET scan on outpatient basis. 10. Hypokalemia: K+ 3.3 2/26 will recheck in am increase KCL supplement,  11. Cytotoxic edema: Weaned of hypertonic solution.    12. Endocarditis  Antibiotic-End date March 7th            13. Anemia: Hgb low but has stayed in 7.6-8.0 range, check stool guaic was pos 1/23, neg on 2/2 and 2/16, Hgb stable at 8.5 2/26   14. Hypotension: Cont to monitor 15. Headache:, Tramadol 190m Q6h prn,resolved 16.  Episode of mild desat with tachy - CXR read as increased RML infiltrate, compared to prior film I see no difference, WBC normal , pt afebrile- she will cont ceftriaxone for her endocarditis for another week 17.  Mildly increased troponin of questionalble clinical significance,  EKG without  ST Elevations, ask cardiology to f/u LOS (Days) 1[place] FallsEVALUATION WAS PERFOR[place]  Tamico Mundo E 07/21/2015, 8:01 AM

## 2015-07-21 NOTE — Plan of Care (Signed)
Problem: RH PAIN MANAGEMENT Goal: RH STG PAIN MANAGED AT OR BELOW PT'S PAIN GOAL <4 on 0-10 pain scale.  Outcome: Progressing No c/o pain

## 2015-07-21 NOTE — Consult Note (Signed)
Reason for Consult: SOB and elevated troponin   Referring Physician: Dr. Read Drivers   PCP:  No primary care provider on file.  Primary Cardiologist:New  Becky Patterson is an 60 y.o. female.    Chief Complaint: pt on rehab after eventful Jan./Feb 2017 now with SOB and tachycardia  HPI: 60 year old female who previously had no medical problems no prior cardiac hx.  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 EF 60-65%.  At discharge fluoroquinolone was to continue until March 5 for completion of therapy.  And Lung mass and liver mass to be followed at cancer center.  She returned for admit 06/29/15 for Lt sided weakness and on CTA of head emergent large vessel occlusion, Occluded right ICA terminus, right MCA and right ACA origins.  She was not a candidate for IV TPA due to infective endocarditis.  She then underwent attempted mechanical thrombectomy with IR.  She had a large R MCA infarct with significant cerebral edema with uncal herniation due to R ICA terminus/R MCA occlusion secondary to MV endocarditis.  Also with splenic infarcts.  Pt with hemiplegia,dysphasia and sensory disturbance.  Pt had desats to 86% RA  07/20/15 with respiratory distress and HR in 130s .  BP also elevated, pt stated she was having an anxiety attack.  EKG was ST at 123 but no acute changes from 06/29/15 except faster rate.  Her troponin peaked at 0.09.  She is anemic. 1V CXR with increasing right medial lung base infiltrate.  No further episodes.    Today she stated she used to have panic attacks.  Though she could usually talk herself out of them.  Yesterday felt similar.   No chest pain and no further episodes of SOB.      Past Medical History  Diagnosis Date  . Lung mass   . Bacterial endocarditis   . Splenic infarction   . Epistaxis     Past  Surgical History  Procedure Laterality Date  . Back surgery    . Tee without cardioversion N/A 06/24/2015    Procedure: TRANSESOPHAGEAL ECHOCARDIOGRAM (TEE);  Surgeon: Larey Dresser, MD;  Location: [facility];  Service: Cardiovascular;  Laterality: N/A;  . Radiology with anesthesia N/A 06/29/2015    Procedure: RADIOLOGY WITH ANESTHESIA;  Surgeon: Luanne Bras, MD;  Location: [facility];  Service: Radiology;  Laterality: N/A;  . Cholecystectomy      Family History  Problem Relation Age of Onset  . Lung cancer Mother   . Liver cancer Brother   . Heart attack Brother    Social History:  reports that she has been smoking.  She does not have any smokeless tobacco history on file. She reports that she does not drink alcohol or use illicit drugs.  Allergies:  Allergies  Allergen Reactions  . Codeine Nausea And Vomiting    OUTPATIENT MEDICATIONS: No current facility-administered medications on file prior to encounter.   Current Outpatient Prescriptions on File Prior to Encounter  Medication Sig Dispense Refill  . acetaminophen (TYLENOL) 500 MG tablet Take 1,000 mg by mouth 2 (two) times daily as needed for mild pain or headache.    . ferrous sulfate 325 (65 FE) MG tablet Take 1 tablet (325 mg total) by mouth 2 (two) times daily with a meal. 30 tablet 3  . furosemide (LASIX) 40 MG tablet Take  1 tablet (40 mg total) by mouth daily. 30 tablet 0  . Hydrocodone-Acetaminophen 5-300 MG TABS Take 1 tablet by mouth every 6 (six) hours as needed (for pain). Reported on 06/15/2015  0  . naproxen sodium (ANAPROX) 220 MG tablet Take 440 mg by mouth 2 (two) times daily as needed (for pain).    . promethazine (PHENERGAN) 6.25 MG/5ML syrup Take 10 mLs (12.5 mg total) by mouth every 6 (six) hours as needed for nausea or vomiting. 120 mL 0  . traMADol (ULTRAM) 50 MG tablet Take 1-2 tablets (50-100 mg total) by mouth every 6 (six) hours as needed for moderate pain. 30 tablet 0   CURRENT  MEDICATIONS: Scheduled Meds: . antiseptic oral rinse  7 mL Mouth Rinse BID  . cefTRIAXone (ROCEPHIN)  IV  2 g Intravenous Q12H  . chlorhexidine gluconate  15 mL Mouth Rinse BID  . enoxaparin (LOVENOX) injection  40 mg Subcutaneous Q24H  . feeding supplement (PRO-STAT SUGAR FREE 64)  30 mL Oral BID  . hydrocortisone cream   Topical TID  . insulin aspart  0-15 Units Subcutaneous TID WC  . iron polysaccharides  150 mg Oral q12n4p  . methylphenidate  5 mg Oral Q breakfast  . multivitamin with minerals  1 tablet Per Tube Daily  . nicotine  21 mg Transdermal Daily  . potassium chloride  10 mEq Oral Daily  . traZODone  50 mg Oral QHS   Continuous Infusions:  PRN Meds:.acetaminophen, alum & mag hydroxide-simeth, bisacodyl, guaiFENesin-dextromethorphan, hydrOXYzine, methocarbamol, MUSCLE RUB, prochlorperazine **OR** prochlorperazine **OR** prochlorperazine, RESOURCE THICKENUP CLEAR, sodium chloride flush, sodium phosphate, traMADol   Results for orders placed or performed during the hospital encounter of 07/07/15 (from the past 48 hour(s))  Glucose, capillary     Status: Abnormal   Collection Time: 07/19/15 11:23 AM  Result Value Ref Range   Glucose-Capillary 148 (H) 65 - 99 mg/dL  Glucose, capillary     Status: Abnormal   Collection Time: 07/19/15  4:32 PM  Result Value Ref Range   Glucose-Capillary 127 (H) 65 - 99 mg/dL  Glucose, capillary     Status: Abnormal   Collection Time: 07/19/15  9:02 PM  Result Value Ref Range   Glucose-Capillary 129 (H) 65 - 99 mg/dL  CBC     Status: Abnormal   Collection Time: 07/20/15  5:11 AM  Result Value Ref Range   WBC 8.7 4.0 - 10.5 K/uL   RBC 2.79 (L) 3.87 - 5.11 MIL/uL   Hemoglobin 8.5 (L) 12.0 - 15.0 g/dL   HCT 26.1 (L) 36.0 - 46.0 %   MCV 93.5 78.0 - 100.0 fL   MCH 30.5 26.0 - 34.0 pg   MCHC 32.6 30.0 - 36.0 g/dL   RDW 15.8 (H) 11.5 - 15.5 %   Platelets 348 150 - 400 K/uL  Glucose, capillary     Status: Abnormal   Collection Time: 07/20/15   6:45 AM  Result Value Ref Range   Glucose-Capillary 138 (H) 65 - 99 mg/dL  Glucose, capillary     Status: Abnormal   Collection Time: 07/20/15 11:53 AM  Result Value Ref Range   Glucose-Capillary 127 (H) 65 - 99 mg/dL  Glucose, capillary     Status: Abnormal   Collection Time: 07/20/15  4:11 PM  Result Value Ref Range   Glucose-Capillary 137 (H) 65 - 99 mg/dL  Troponin I (q 6hr x 3)     Status: Abnormal   Collection Time: 07/20/15  6:20 PM  Result Value Ref Range   Troponin I 0.08 (H) <0.031 ng/mL    Comment:        PERSISTENTLY INCREASED TROPONIN VALUES IN THE RANGE OF 0.04-0.49 ng/mL CAN BE SEEN IN:       -UNSTABLE ANGINA       -CONGESTIVE HEART FAILURE       -MYOCARDITIS       -CHEST TRAUMA       -ARRYHTHMIAS       -LATE PRESENTING MYOCARDIAL INFARCTION       -COPD   CLINICAL FOLLOW-UP RECOMMENDED.   Glucose, capillary     Status: Abnormal   Collection Time: 07/20/15  9:04 PM  Result Value Ref Range   Glucose-Capillary 144 (H) 65 - 99 mg/dL  Troponin I (q 6hr x 3)     Status: Abnormal   Collection Time: 07/20/15 11:17 PM  Result Value Ref Range   Troponin I 0.09 (H) <0.031 ng/mL    Comment:        PERSISTENTLY INCREASED TROPONIN VALUES IN THE RANGE OF 0.04-0.49 ng/mL CAN BE SEEN IN:       -UNSTABLE ANGINA       -CONGESTIVE HEART FAILURE       -MYOCARDITIS       -CHEST TRAUMA       -ARRYHTHMIAS       -LATE PRESENTING MYOCARDIAL INFARCTION       -COPD   CLINICAL FOLLOW-UP RECOMMENDED.   Troponin I (q 6hr x 3)     Status: Abnormal   Collection Time: 07/21/15  5:13 AM  Result Value Ref Range   Troponin I 0.07 (H) <0.031 ng/mL    Comment:        PERSISTENTLY INCREASED TROPONIN VALUES IN THE RANGE OF 0.04-0.49 ng/mL CAN BE SEEN IN:       -UNSTABLE ANGINA       -CONGESTIVE HEART FAILURE       -MYOCARDITIS       -CHEST TRAUMA       -ARRYHTHMIAS       -LATE PRESENTING MYOCARDIAL INFARCTION       -COPD   CLINICAL FOLLOW-UP RECOMMENDED.   Basic metabolic  panel     Status: Abnormal   Collection Time: 07/21/15  5:13 AM  Result Value Ref Range   Sodium 134 (L) 135 - 145 mmol/L   Potassium 3.3 (L) 3.5 - 5.1 mmol/L   Chloride 100 (L) 101 - 111 mmol/L   CO2 26 22 - 32 mmol/L   Glucose, Bld 129 (H) 65 - 99 mg/dL   BUN 8 6 - 20 mg/dL   Creatinine, Ser 0.70 0.44 - 1.00 mg/dL   Calcium 8.8 (L) 8.9 - 10.3 mg/dL   GFR calc non Af Amer >60 >60 mL/min   GFR calc Af Amer >60 >60 mL/min    Comment: (NOTE) The eGFR has been calculated using the CKD EPI equation. This calculation has not been validated in all clinical situations. eGFR's persistently <60 mL/min signify possible Chronic Kidney Disease.    Anion gap 8 5 - 15  Glucose, capillary     Status: Abnormal   Collection Time: 07/21/15  6:56 AM  Result Value Ref Range   Glucose-Capillary 117 (H) 65 - 99 mg/dL   Dg Chest Port 1 View  07/20/2015  CLINICAL DATA:  Hypoxia EXAM: PORTABLE CHEST 1 VIEW COMPARISON:  07/03/2015 FINDINGS: Cardiac shadow is stable. A right-sided PICC line is again seen in the distal superior vena cava. Increasing  right medial lung base infiltrate is noted. Atelectatic changes are noted in the left retrocardiac region. IMPRESSION: Increasing right medial lung base infiltrate. Electronically Signed   By: Inez Catalina M.D.   On: 07/20/2015 21:27    ROS: General:no colds or fevers, no weight changes Skin:no rashes or ulcers HEENT:no blurred vision, no congestion CV:see HPI PUL:see HPI GI:no diarrhea constipation or melena, no indigestion GU:no hematuria, no dysuria MS:no joint pain, no claudication Neuro:no syncope, no lightheadedness Endo:recent dx. diabetes, no thyroid disease   Blood pressure 114/69, pulse 98, temperature 98.3 F (36.8 C), temperature source Oral, resp. rate 19, height _0  (1.549 m), weight 183 lb 4.8 oz (83.144 kg), SpO2 98 %.  Wt Readings from Last 3 Encounters:  07/19/15 183 lb 4.8 oz (83.144 kg)  07/07/15 203 lb (92.08 kg)  06/24/15 202 lb  (91.627 kg)    PE: General:Pleasant affect, NAD, not moving Lt side Skin:Warm and dry, brisk capillary refill HEENT:normocephalic, sclera clear, mucus membranes moist, Lt facial droop Neck:supple, no JVD, no bruits  Heart:S1S2 RRR with 9-4/8 systolic murmur with radiation to back, no gallup, rub or click Lungs: without rales, occ rhonchi, occ wheeze NIO:EVOJ, non tender, + BS, do not palpate liver spleen or masses Ext:no lower ext edema, 2+ pedal pulses, 2+ radial pulses Neuro:alert and oriented X 3, MAE, follows commands, + facial symmetry    Assessment/Plan Active Problems:   Acute right MCA stroke (HCC)   Intracranial septic embolism (HCC)   Hemiplegia affecting left nondominant side (HCC)   Dysphagia   Alteration of sensation as late effect of stroke   Dehydration   Bacterial endocarditis   Maculopapular rash   Acute blood loss anemia   Other vascular headache  1. Elevated troponin- most likely demand ischemia with tachycardia -risk factors for CAD new DM a brother with MI  2.  SOB - Could be from prior anxiety however could also be from newly discovered severe mitral regurgitation. Loud murmur heard on exam.  3.  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 March 21st.  ---will need TEE to reeval at end of course of therapy.  ----TTE has been done awaiting results.   4. 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 Agree with above. Bacterial endocarditis with stroke sequela, mitral valve-1.5 cm x 0.7cm vegetation on TEE 06/24/15 with no significant regurgitation at that time. Now echocardiogram, transthoracic from 07/21/15  demonstrates severe mitral regurgitation. Loud holosystolic murmur heard on exam. No significant crackles in lung bases. Unfortunately she is unable to use the left side of her body with abnormal motor 0/5 left upper extremity and left lower extremity. Her sedimentation rate remains elevated, 102.  Discussed with infectious disease. They will order repeat cultures. I will go ahead and consult CT surgery although given her left-sided hemiplegia, operative candidacy may be poor. Can perform TEE in future if needed. As of note, if TEE in the future as needed, she is requesting increased sedation. Continue with Lasix. Appears comfortable currently laying flat. Discussed with husband. He just recently had a pacemaker placed, Dr. Caryl Comes.     Candee Furbish, MD

## 2015-07-21 NOTE — Progress Notes (Signed)
Physical Therapy Note  Patient Details  Name: Becky Patterson MRN: [id number] Date of Birth: [birth year] Today's Date: 07/21/2015    Time: 800-900 60 minutes  1:1 Pt c/o Rt shoulder pain, repositioned appropriately.  Rolling multiple attempts for hygiene and dressing with max A to Rt, min A to Lt with bed rails.  Pt requires mod cuing for attention during task, frequently distracted internally by itching, pain in Rt shoulder.  Seated balance on bed with supervision with 1 UE support, total A without UE support.  Attempt to engage pt in activating trunk mm to assist with sitting balance, pt impaired by decreased proprioception, delayed motor planning and processing, trunk weakness.  Sliding board transfers initiated with pt able to transfer to level surfaces with +2 mod A, pt able to assist with R LE with max tactile cuing.  Seated balance on mat with pt able to sit with supervision x 5 minutes without UE support before trunk mm fatigue.   Kylei Purington 07/21/2015, 10:08 AM

## 2015-07-22 ENCOUNTER — Inpatient Hospital Stay (HOSPITAL_COMMUNITY): Payer: 59

## 2015-07-22 ENCOUNTER — Inpatient Hospital Stay (HOSPITAL_COMMUNITY): Payer: 59 | Admitting: *Deleted

## 2015-07-22 ENCOUNTER — Inpatient Hospital Stay (HOSPITAL_COMMUNITY): Payer: 59 | Admitting: Physical Therapy

## 2015-07-22 ENCOUNTER — Inpatient Hospital Stay (HOSPITAL_COMMUNITY): Payer: 59 | Admitting: Occupational Therapy

## 2015-07-22 ENCOUNTER — Inpatient Hospital Stay (HOSPITAL_COMMUNITY): Payer: 59 | Admitting: Speech Pathology

## 2015-07-22 ENCOUNTER — Inpatient Hospital Stay: Payer: 59 | Admitting: Internal Medicine

## 2015-07-22 DIAGNOSIS — R51 Headache: Secondary | ICD-10-CM

## 2015-07-22 DIAGNOSIS — I63411 Cerebral infarction due to embolism of right middle cerebral artery: Secondary | ICD-10-CM

## 2015-07-22 DIAGNOSIS — R519 Headache, unspecified: Secondary | ICD-10-CM | POA: Insufficient documentation

## 2015-07-22 DIAGNOSIS — I340 Nonrheumatic mitral (valve) insufficiency: Secondary | ICD-10-CM

## 2015-07-22 LAB — GLUCOSE, CAPILLARY
GLUCOSE-CAPILLARY: 114 mg/dL — AB (ref 65–99)
GLUCOSE-CAPILLARY: 106 mg/dL — AB (ref 65–99)
Glucose-Capillary: 100 mg/dL — ABNORMAL HIGH (ref 65–99)
Glucose-Capillary: 188 mg/dL — ABNORMAL HIGH (ref 65–99)

## 2015-07-22 LAB — C-REACTIVE PROTEIN: CRP: 6.6 mg/dL — AB (ref <1.0)

## 2015-07-22 LAB — SEDIMENTATION RATE: Sed Rate: 101 mm/hr — ABNORMAL HIGH (ref 0–22)

## 2015-07-22 MED ORDER — ALPRAZOLAM 0.25 MG PO TABS
0.2500 mg | ORAL_TABLET | Freq: Once | ORAL | Status: AC
  Administered 2015-07-22: 0.25 mg via ORAL
  Filled 2015-07-22: qty 1

## 2015-07-22 MED ORDER — POTASSIUM CHLORIDE CRYS ER 20 MEQ PO TBCR
20.0000 meq | EXTENDED_RELEASE_TABLET | Freq: Every day | ORAL | Status: DC
  Administered 2015-07-22 – 2015-07-30 (×9): 20 meq via ORAL
  Filled 2015-07-22 (×9): qty 1

## 2015-07-22 MED ORDER — GADOBENATE DIMEGLUMINE 529 MG/ML IV SOLN
17.0000 mL | Freq: Once | INTRAVENOUS | Status: AC | PRN
  Administered 2015-07-22: 17 mL via INTRAVENOUS

## 2015-07-22 NOTE — Progress Notes (Signed)
Subjective: Left sided headache   Antibiotics:  Anti-infectives    Start     Dose/Rate Route Frequency Ordered Stop   07/07/15 2100  cefTRIAXone (ROCEPHIN) 2 g in dextrose 5 % 50 mL IVPB     2 g 100 mL/hr over 30 Minutes Intravenous Every 12 hours 07/07/15 1605     07/07/15 1715  fluconazole (DIFLUCAN) tablet 150 mg     150 mg Oral  Once 07/07/15 1709 07/07/15 1733      Medications: Scheduled Meds: . antiseptic oral rinse  7 mL Mouth Rinse BID  . cefTRIAXone (ROCEPHIN)  IV  2 g Intravenous Q12H  . chlorhexidine gluconate  15 mL Mouth Rinse BID  . enoxaparin (LOVENOX) injection  40 mg Subcutaneous Q24H  . feeding supplement (PRO-STAT SUGAR FREE 64)  30 mL Oral BID  . hydrocortisone cream   Topical TID  . insulin aspart  0-15 Units Subcutaneous TID WC  . iron polysaccharides  150 mg Oral q12n4p  . methylphenidate  5 mg Oral Q breakfast  . multivitamin with minerals  1 tablet Per Tube Daily  . nicotine  21 mg Transdermal Daily  . potassium chloride  20 mEq Oral Daily  . traZODone  50 mg Oral QHS   Continuous Infusions:  PRN Meds:.acetaminophen, alum & mag hydroxide-simeth, bisacodyl, guaiFENesin-dextromethorphan, hydrOXYzine, methocarbamol, MUSCLE RUB, prochlorperazine **OR** prochlorperazine **OR** prochlorperazine, RESOURCE THICKENUP CLEAR, sodium chloride flush, sodium phosphate, traMADol    Objective: Weight change:   Intake/Output Summary (Last 24 hours) at 07/22/15 1643 Last data filed at 07/22/15 1300  Gross per 24 hour  Intake    660 ml  Output      0 ml  Net    660 ml   Blood pressure 147/86, pulse 99, temperature 98.8 F (37.1 C), temperature source Oral, resp. rate 22, height '5\' 1"'  (1.549 m), weight 186 lb 1.6 oz (84.414 kg), SpO2 99 %. Temp:  [98.3 F (36.8 C)-98.8 F (37.1 C)] 98.8 F (37.1 C) (02/28 1427) Pulse Rate:  [92-99] 99 (02/28 1427) Resp:  [18-22] 22 (02/28 1427) BP: (105-147)/(58-86) 147/86 mmHg (02/28 1427) SpO2:  [95 %-99 %]  99 % (02/28 1427) Weight:  [186 lb 1.6 oz (84.414 kg)] 186 lb 1.6 oz (84.414 kg) (02/28 0539)  Physical Exam: General: Alert and awake, oriented x3, not in any acute distress. HEENT: anicteric sclera, EOMI, oropharynx clear and without exudate Cardiovascular: rr, loud 3/6 systolic murmur loudest at PMI with radiation to axilla Pulmonary: clear to auscultation bilaterally, no wheezing, rales or rhonchi Gastrointestinal: soft nontender, nondistended, normal bowel sounds, Skin,: no rashes Neuro: left sided hemiparesis  CBC: CBC Latest Ref Rng 07/20/2015 07/14/2015 07/08/2015  WBC 4.0 - 10.5 K/uL 8.7 8.9 8.1  Hemoglobin 12.0 - 15.0 g/dL 8.5(L) 8.1(L) 7.6(L)  Hematocrit 36.0 - 46.0 % 26.1(L) 26.1(L) 23.4(L)  Platelets 150 - 400 K/uL 348 403(H) 323       BMET  Recent Labs  07/21/15 0513  NA 134*  K 3.3*  CL 100*  CO2 26  GLUCOSE 129*  BUN 8  CREATININE 0.70  CALCIUM 8.8*     Liver Panel  No results for input(s): PROT, ALBUMIN, AST, ALT, ALKPHOS, BILITOT, BILIDIR, IBILI in the last 72 hours.     Sedimentation Rate  Recent Labs  07/22/15 0425  ESRSEDRATE 101*   C-Reactive Protein  Recent Labs  07/21/15 1258 07/22/15 0425  CRP 7.7* 6.6*    Micro Results: Recent Results (from the  past 720 hour(s))  MRSA PCR Screening     Status: None   Collection Time: 06/29/15  3:54 PM  Result Value Ref Range Status   MRSA by PCR NEGATIVE NEGATIVE Final    Comment:        The GeneXpert MRSA Assay (FDA approved for NASAL specimens only), is one component of a comprehensive MRSA colonization surveillance program. It is not intended to diagnose MRSA infection nor to guide or monitor treatment for MRSA infections.   Culture, blood (Routine X 2) w Reflex to ID Panel     Status: None   Collection Time: 06/29/15  4:25 PM  Result Value Ref Range Status   Specimen Description BLOOD RIGHT ARM  Final   Special Requests BOTTLES DRAWN AEROBIC AND ANAEROBIC 10CC  Final    Culture NO GROWTH 5 DAYS  Final   Report Status 07/04/2015 FINAL  Final  Culture, blood (Routine X 2) w Reflex to ID Panel     Status: None   Collection Time: 06/29/15  4:35 PM  Result Value Ref Range Status   Specimen Description BLOOD RIGHT HAND  Final   Special Requests BOTTLES DRAWN AEROBIC ONLY [place]  Final   Culture NO GROWTH 5 DAYS  Final   Report Status 07/04/2015 FINAL  Final  Culture, blood (Routine X 2) w Reflex to ID Panel     Status: None (Preliminary result)   Collection Time: 07/21/15  6:19 PM  Result Value Ref Range Status   Specimen Description BLOOD LEFT ANTECUBITAL  Final   Special Requests BOTTLES DRAWN AEROBIC ONLY [place]  Final   Culture NO GROWTH < 24 HOURS  Final   Report Status PENDING  Incomplete  Culture, blood (Routine X 2) w Reflex to ID Panel     Status: None (Preliminary result)   Collection Time: 07/21/15  6:28 PM  Result Value Ref Range Status   Specimen Description BLOOD LEFT FOREARM  Final   Special Requests BOTTLES DRAWN AEROBIC AND ANAEROBIC 5CC  Final   Culture NO GROWTH < 24 HOURS  Final   Report Status PENDING  Incomplete    Studies/Results: Dg Chest Port 1 View  07/20/2015  CLINICAL DATA:  Hypoxia EXAM: PORTABLE CHEST 1 VIEW COMPARISON:  07/03/2015 FINDINGS: Cardiac shadow is stable. A right-sided PICC line is again seen in the distal superior vena cava. Increasing right medial lung base infiltrate is noted. Atelectatic changes are noted in the left retrocardiac region. IMPRESSION: Increasing right medial lung base infiltrate. Electronically Signed   By: Inez Catalina M.D.   On: 07/20/2015 21:27      Assessment/Plan:  INTERVAL HISTORY:   Patient seen by CT surgery and felt understandably  not to be surgical candidate   Active Problems:   Acute right MCA stroke ([place])   Intracranial septic embolism ([place])   Hemiplegia affecting left nondominant side (HCC)   Dysphagia   Alteration of sensation as late effect of stroke   Dehydration    Bacterial endocarditis   Maculopapular rash   Acute blood loss anemia   Other vascular headache   Septic embolism (HCC)   Severe mitral regurgitation   Cerebrovascular accident (CVA) due to embolism of right middle cerebral artery ([place])    Becky Patterson is a 60 y.o. female  with enterbacter bacteremia with endocarditis complicated by splenic infarct and then CNS emboli sp unsuccessful thrombectomy of right MCA. On exam today she has loud murmur. TTE shows now severe Mitral regurgitation. 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, spleen now with severe MR:  Seen by CT surgery and as they have agreed that he is not a surgical candidate I would not pursue a transesophageal echocardiogram  I will check an MRI of the brain with contrast to see if there is any changes in the large right-sided MCA given her persistent headaches  I would continue her ceftriaxone  I would follow the blood cultures  I would plan on original course of 56 days of IV ceftriaxone at high dose through March 20th and then stop the antibiotics  UNLESS we find something on MRI that would suggest need for even more protracted therapy   #2 Severe MR : to be managed surgically  #3 Goals of care: would encouarge revisiting goals in light of worsening HF  We would be happy to see her in followup if continued aggressive care is desire and we would plan on doing so in early to mid April  I will followup the MRI results but otherwise sign off for now  Please call with further questions.     #2 HA   LOS: 15 days   Becky Patterson 07/22/2015, 4:43 PM

## 2015-07-22 NOTE — Progress Notes (Signed)
Nutrition Follow-up  DOCUMENTATION CODES:   Obesity unspecified  INTERVENTION:  Continue 30 ml Prostat po BID, each supplement provides 100 kcal and 15 grams of protein.   Encourage adequate PO intake.   NUTRITION DIAGNOSIS:   Inadequate oral intake related to lethargy/confusion, dysphagia as evidenced by  (varied meal completion 5-85%); improving  GOAL:   Patient will meet greater than or equal to 90% of their needs; progressing  MONITOR:   PO intake, Supplement acceptance, Weight trends, Labs, I & O's, Diet advancement, Skin  REASON FOR ASSESSMENT:   Low [place]    ASSESSMENT:   60 y.o. 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 She was admitted on 06/29/15 with acute onset of left sided weakness with slurred speech and left neglect.   Pt now on a dysphagia 3 diet with nectar thick liquids. Meal completion has been 50-100%, intake has been improving. Pt currently has Prostat ordered and has been consuming them. RD to continue with current orders to aid in caloric and protein needs. Encourage adequate PO intake.   Diet Order:  DIET DYS 3 Room service appropriate?: Yes; Fluid consistency:: Nectar Thick  Skin:  Wound (see comment) (Stage I on buttocks)  Last BM:  2/28  Height:   Ht Readings from Last 1 Encounters:  07/07/15 '5\' 1"'$  (1.549 m)    Weight:   Wt Readings from Last 1 Encounters:  07/22/15 186 lb 1.6 oz (84.414 kg)    Ideal Body Weight:  47.7 kg  BMI:  Body mass index is 35.18 kg/(m^2).  Estimated Nutritional Needs:   Kcal:  0370-4888  Protein:  85-100 grams  Fluid:  1.7 - 1.9 L/day  EDUCATION NEEDS:   No education needs identified at this time  Corrin Parker, MS, RD, LDN Pager # [phone] After hours/ weekend pager # [phone]

## 2015-07-22 NOTE — Progress Notes (Signed)
Subjective:  No dyspnea today.  Sitting in wheelchair Husband and daughter here No CP  Objective:  Vital Signs in the last 24 hours: Temp:  [98.3 F (36.8 C)] 98.3 F (36.8 C) (02/28 0539) Pulse Rate:  [92-101] 92 (02/28 0539) Resp:  [18] 18 (02/28 0539) BP: (105-109)/(58-68) 105/58 mmHg (02/28 0539) SpO2:  [95 %-97 %] 95 % (02/28 0539) Weight:  [186 lb 1.6 oz (84.414 kg)] 186 lb 1.6 oz (84.414 kg) (02/28 0539)  Intake/Output from previous day: 02/27 0701 - 02/28 0700 In: 460 [P.O.:420; I.V.:40] Out: -    Physical Exam: General: Well developed, well nourished, in no acute distress. Head:  Normocephalic and atraumatic. Lungs: Clear to auscultation and percussion. Heart: Normal S1 and S2.  3/6 HSM, no rubs or gallops.  Abdomen: soft, non-tender, positive bowel sounds. Extremities: No clubbing or cyanosis. No edema. PICC line in place Neurologic: Alert and oriented x 3. 0/5 Left sided movement    Lab Results:  Recent Labs  07/20/15 0511  WBC 8.7  HGB 8.5*  PLT 348    Recent Labs  07/21/15 0513  NA 134*  K 3.3*  CL 100*  CO2 26  GLUCOSE 129*  BUN 8  CREATININE 0.70    Recent Labs  07/20/15 2317 07/21/15 0513  TROPONINI 0.09* 0.07*    Imaging: Dg Chest Port 1 View  07/20/2015  CLINICAL DATA:  Hypoxia EXAM: PORTABLE CHEST 1 VIEW COMPARISON:  07/03/2015 FINDINGS: Cardiac shadow is stable. A right-sided PICC line is again seen in the distal superior vena cava. Increasing right medial lung base infiltrate is noted. Atelectatic changes are noted in the left retrocardiac region. IMPRESSION: Increasing right medial lung base infiltrate. Electronically Signed   By: Inez Catalina M.D.   On: 07/20/2015 21:27   Personally viewed.    Cardiac Studies:  ECHO 07/20/14 - Left ventricle: The cavity size was normal. Wall thickness was normal. Systolic function was normal. The estimated ejection fraction was in the range of 60% to 65%. Wall motion was  normal; there were no regional wall motion abnormalities. Features are consistent with a pseudonormal left ventricular filling pattern, with concomitant abnormal relaxation and increased filling pressure (grade 2 diastolic dysfunction). - Mitral valve: Calcified annulus. Moderately thickened anterior leaflet, possible broad-based vegetation. There was severe regurgitation directed eccentrically and toward the free wall. Severe regurgitation is suggested by pulmonary vein systolic flow reversal. - Left atrium: The atrium was mildly dilated.  Impressions:  - Compared to the previous report, there is now severe mitral insufficiency.  Meds: Scheduled Meds: . antiseptic oral rinse  7 mL Mouth Rinse BID  . cefTRIAXone (ROCEPHIN)  IV  2 g Intravenous Q12H  . chlorhexidine gluconate  15 mL Mouth Rinse BID  . enoxaparin (LOVENOX) injection  40 mg Subcutaneous Q24H  . feeding supplement (PRO-STAT SUGAR FREE 64)  30 mL Oral BID  . hydrocortisone cream   Topical TID  . insulin aspart  0-15 Units Subcutaneous TID WC  . iron polysaccharides  150 mg Oral q12n4p  . methylphenidate  5 mg Oral Q breakfast  . multivitamin with minerals  1 tablet Per Tube Daily  . nicotine  21 mg Transdermal Daily  . potassium chloride  20 mEq Oral Daily  . traZODone  50 mg Oral QHS   Continuous Infusions:  PRN Meds:.acetaminophen, alum & mag hydroxide-simeth, bisacodyl, guaiFENesin-dextromethorphan, hydrOXYzine, methocarbamol, MUSCLE RUB, prochlorperazine **OR** prochlorperazine **OR** prochlorperazine, RESOURCE THICKENUP CLEAR, sodium chloride flush, sodium phosphate, traMADol  Assessment/Plan:  Active Problems:   Acute right MCA stroke (HCC)   Intracranial septic embolism (HCC)   Hemiplegia affecting left nondominant side (HCC)   Dysphagia   Alteration of sensation as late effect of stroke   Dehydration   Bacterial endocarditis   Maculopapular rash   Acute blood loss anemia   Other  vascular headache   Septic embolism (HCC)   Severe mitral regurgitation   Cerebrovascular accident (CVA) due to embolism of right middle cerebral artery ([place])   Bacterial endocarditis - Cardiothoracic surgery consultation reviewed, Dr. Darcey Nora. She would not be a surgical candidate for mitral valve replacement, risks outweigh benefits. - Continuing antibiotics as directed by infectious disease - Blood cultures repeat pending, ESR greater than 100 (prior Enterobacter bacteremia from bilateral pyelonephritis) - Vegetative material is still present on the mitral valve but minimized when compared to prior TEE. - I do not think that repeat TEE will be of benefit. - Subsequent septic emboli/stroke with left-sided hemiplegia - Spoke with family and patient at length.  Severe mitral regurgitation - Bacterial endocarditis - Previous TEE showed no significant mitral valve degeneration - Despite antibiotic therapy, severe mitral regurgitation has occurred. - Would use Lasix 20 mg by mouth when necessary shortness of breath. Currently appears euvolemic.  She continues DO NOT RESUSCITATE  CT scan of chest showed a 2 cm mass superior segment left lower lobe with mediastinal fullness-adenopathy in the sub-carinal region, strong smoking history-as described by Dr. Darcey Nora. Mass is suspicious for bronchogenic carcinoma, possibly advanced age with subcarinal adenopathy.  She will continue with her rehabilitation efforts. We'll go ahead and sign off, please let us know if we can be of further assistance.  Becky Patterson, [place] 07/22/2015, 1:03 PM

## 2015-07-22 NOTE — Consult Note (Addendum)
[address]       [place] [address]             [phone]        Demetric S Heidrich [place] Medical Record #[id number] Date of Birth: November 23, 1955  Referring:  Dr Marlou Porch, Cardiology Primary Care: No primary care provider on file.  Chief Complaint:   Left-sided paralysis  History of Present Illness:     Patient examined, transthoracic and transesophageal echocardiograms personally reviewed, CT of chest and CT of head personally reviewed.  I was asked to evaluate this unfortunate 60 year old Caucasian female nondiabetic smoker who developed Enterobacter bacteremia from bilateral pyelonephritis. She was admitted to the hospital last month with an embolic stroke from a vegetation which occluded her right middle cerebral artery and resulted in a large brain infarct. She is still hemiplegic but is able to speak because she is right-handed. She is  generally weak and nonambulatory. She is on a dysphagia diet.  At the time of the patient's stroke her mitral valve endocarditis did not cause significant mitral regurgitation.she was discharged to inpatient rehabilitation but 2 days ago developed symptoms of tachycardia shortness of breath and pulmonary edema. Repeat echocardiogram showed now severe mitral regurgitation with a 1.5 cm vegetation remaining on the mitral valve. There is no clear involvement of endocarditis with aortic valve. Biventricular function is normal.she was treated with diuresis and symptomatically is improved. She she has remained on the rehabilitation service on IV antibiotics. She is DO NOT RESUSCITATE after family discussion.  The patient previously underwent a CT scan of the chest which shows a 2 cm mass in the spear segment left lower lobe associated with some mediastinal fullness-adenopathy in the subcarinal region. The patient has  a strong smoking history had smoked 2 packs per day up until the time of her CVA.   Current Activity/ Functional  Status: Patient is totally immobile, nonambulatory and hemiplegic on the left side with left-sided visual deficit as well   Zubrod Score: At the time of surgery this patient's most appropriate activity status/level should be described as: '[]'$     0    Normal activity, no symptoms '[]'$     1    Restricted in physical strenuous activity but ambulatory, able to do out light work '[]'$     2    Ambulatory and capable of self care, unable to do work activities, up and about                 more than 50%  Of the time                            '[]'$     3    Only limited self care, in bed greater than 50% of waking hours '[x]'$     4    Completely disabled, no self care, confined to bed or chair '[]'$     5    Moribund  Past Medical History  Diagnosis Date  . Lung mass   . Bacterial endocarditis   . Splenic infarction   . Epistaxis     Past Surgical History  Procedure Laterality Date  . Back surgery    . Tee without cardioversion N/A 06/24/2015    Procedure: TRANSESOPHAGEAL ECHOCARDIOGRAM (TEE);  Surgeon: Larey Dresser, MD;  Location: [facility] ENDOSCOPY;  Service: Cardiovascular;  Laterality: N/A;  . Radiology with anesthesia N/A 06/29/2015    Procedure: RADIOLOGY WITH  ANESTHESIA;  Surgeon: Luanne Bras, MD;  Location: [place];  Service: Radiology;  Laterality: N/A;  . Cholecystectomy      History  Smoking status  . Current Every Day Smoker  Smokeless tobacco  . Not on file    History  Alcohol Use No    Social History   Social History  . Marital Status: Married    Spouse Name: N/A  . Number of Children: N/A  . Years of Education: N/A   Occupational History  . Not on file.   Social History Main Topics  . Smoking status: Current Every Day Smoker  . Smokeless tobacco: Not on file  . Alcohol Use: No  . Drug Use: No  . Sexual Activity: Not on file   Other Topics Concern  . Not on file   Social History Narrative    Allergies  Allergen Reactions  . Codeine Nausea And Vomiting     Current Facility-Administered Medications  Medication Dose Route Frequency Provider Last Rate Last Dose  . acetaminophen (TYLENOL) tablet 650 mg  650 mg Oral Q6H PRN Bary Leriche, PA-C   650 mg at 07/22/15 0350  . alum & mag hydroxide-simeth (MAALOX/MYLANTA) 200-200-20 MG/5ML suspension 30 mL  30 mL Oral Q4H PRN Bary Leriche, PA-C      . antiseptic oral rinse solution (CORINZ)  7 mL Mouth Rinse BID Ivan Anchors Love, PA-C   7 mL at 07/21/15 2000  . bisacodyl (DULCOLAX) suppository 10 mg  10 mg Rectal Daily PRN Bary Leriche, PA-C      . cefTRIAXone (ROCEPHIN) 2 g in dextrose 5 % 50 mL IVPB  2 g Intravenous Q12H Ivan Anchors Love, PA-C   2 g at 07/22/15 0820  . chlorhexidine gluconate (PERIDEX) 0.12 % solution 15 mL  15 mL Mouth Rinse BID Bary Leriche, PA-C   15 mL at 07/21/15 1948  . enoxaparin (LOVENOX) injection 40 mg  40 mg Subcutaneous Q24H Pamela S Love, PA-C   40 mg at 07/21/15 1723  . feeding supplement (PRO-STAT SUGAR FREE 64) liquid 30 mL  30 mL Oral BID Charlett Blake, MD   30 mL at 07/22/15 0938  . guaiFENesin-dextromethorphan (ROBITUSSIN DM) 100-10 MG/5ML syrup 5-10 mL  5-10 mL Oral Q6H PRN Bary Leriche, PA-C      . hydrocortisone cream 1 %   Topical TID Lavon Paganini Angiulli, PA-C      . hydrOXYzine (ATARAX/VISTARIL) tablet 10 mg  10 mg Oral Q6H PRN Carlyle Basques, MD   10 mg at 07/07/15 2105  . insulin aspart (novoLOG) injection 0-15 Units  0-15 Units Subcutaneous TID WC Bary Leriche, PA-C   2 Units at 07/21/15 1724  . iron polysaccharides (NIFEREX) capsule 150 mg  150 mg Oral q12n4p Ivan Anchors Love, PA-C   150 mg at 07/21/15 1723  . methocarbamol (ROBAXIN) tablet 500 mg  500 mg Oral Q6H PRN Bary Leriche, PA-C   500 mg at 07/22/15 1829  . methylphenidate (RITALIN) tablet 5 mg  5 mg Oral Q breakfast Bary Leriche, PA-C   5 mg at 07/22/15 9371  . multivitamin with minerals tablet 1 tablet  1 tablet Per Tube Daily Bary Leriche, PA-C   1 tablet at 07/22/15 6967  . MUSCLE RUB CREA    Topical BID PRN Charlett Blake, MD      . nicotine (NICODERM CQ - dosed in mg/24 hours) patch 21 mg  21 mg Transdermal Daily Olin Hauser  S Love, PA-C   21 mg at 07/22/15 0820  . potassium chloride SA (K-DUR,KLOR-CON) CR tablet 20 mEq  20 mEq Oral Daily Charlett Blake, MD   20 mEq at 07/22/15 8413  . prochlorperazine (COMPAZINE) tablet 5-10 mg  5-10 mg Oral Q6H PRN Bary Leriche, PA-C       Or  . prochlorperazine (COMPAZINE) injection 5-10 mg  5-10 mg Intramuscular Q6H PRN Bary Leriche, PA-C       Or  . prochlorperazine (COMPAZINE) suppository 12.5 mg  12.5 mg Rectal Q6H PRN Bary Leriche, PA-C      . RESOURCE THICKENUP CLEAR   Oral PRN Bary Leriche, PA-C      . sodium chloride flush (NS) 0.9 % injection 10-40 mL  10-40 mL Intracatheter PRN Ivan Anchors Love, PA-C   40 mL at 07/22/15 0425  . sodium phosphate (FLEET) 7-19 GM/118ML enema 1 enema  1 enema Rectal Once PRN Bary Leriche, PA-C      . traMADol Veatrice Bourbon) tablet 100 mg  100 mg Oral Q6H PRN Charlett Blake, MD   100 mg at 07/22/15 0128  . traZODone (DESYREL) tablet 50 mg  50 mg Oral QHS Pamela S Love, PA-C   50 mg at 07/21/15 1950    Prescriptions prior to admission  Medication Sig Dispense Refill Last Dose  . acetaminophen (TYLENOL) 500 MG tablet Take 1,000 mg by mouth 2 (two) times daily as needed for mild pain or headache.   06/29/2015 at Unknown time  . ferrous sulfate 325 (65 FE) MG tablet Take 1 tablet (325 mg total) by mouth 2 (two) times daily with a meal. 30 tablet 3 06/28/2015 at Unknown time  . furosemide (LASIX) 40 MG tablet Take 1 tablet (40 mg total) by mouth daily. 30 tablet 0 06/28/2015 at Unknown time  . Hydrocodone-Acetaminophen 5-300 MG TABS Take 1 tablet by mouth every 6 (six) hours as needed (for pain). Reported on 06/15/2015  0 Past Month at Unknown time  . naproxen sodium (ANAPROX) 220 MG tablet Take 440 mg by mouth 2 (two) times daily as needed (for pain).   Past Month at Unknown time  . promethazine (PHENERGAN) 6.25  MG/5ML syrup Take 10 mLs (12.5 mg total) by mouth every 6 (six) hours as needed for nausea or vomiting. 120 mL 0 06/28/2015 at Unknown time  . traMADol (ULTRAM) 50 MG tablet Take 1-2 tablets (50-100 mg total) by mouth every 6 (six) hours as needed for moderate pain. 30 tablet 0 Past Month at Unknown time    Family History  Problem Relation Age of Onset  . Lung cancer Mother   . Liver cancer Brother   . Heart attack Brother      Review of Systems:       Cardiac Review of Systems: Y or N  Chest Pain [  no  ]  Resting SOB [intermittent   ] Exertional SOB  [ unable to walk ]  Vertell Limber [place].Blacker  ]   Pedal Edema [mild   ]    Palpitations [no  ] Syncope  [no  ]   Presyncope [ no  ]  General Review of Systems: [Y] = yes [  ]=no Constitional: recent weight change [  ]; anorexia [  ]; fatigue [place].Blacker  ]; nausea [  ]; night sweats [  ]; fever [  ]; or chills [  ]  Dental: poor dentition[ yes ]; Last Dentist visit: several years ago  Eye : blurred vision [  ]; diplopia [   ]; vision changes [yes diminished breath sounds left visual field  ];  Amaurosis fugax[  ]; Resp: cough [  ];  wheezing[  ];  hemoptysis[  ]; shortness of breath[ yes ]; paroxysmal nocturnal dyspnea[  ]; dyspnea on exertion[  ]; or orthopnea[ yes ];  GI:  gallstones[  ], vomiting[  ];  dysphagia[  ]; melena[  ];  hematochezia [  ]; heartburn[  ];   Hx of  Colonoscopy[  ]; GU: kidney stones [  ]; hematuria[  ];   dysuria [  ];  nocturia[  ];  history of     obstruction [  ]; urinary frequency [  ]history of bilateral pyelonephritis causing bacteremia with Enterobacter             Skin: rash, swelling[  ];, hair loss[  ];  peripheral edema[mild  ];  or itching[  ]; Musculosketetal: myalgias[  ];  joint swelling[  ];  joint erythema[  ];  joint pain[  ];  back pain[  ];  Heme/Lymph: bruising[  ];  bleeding[  ];  anemia[  ];  Neuro: TIA[  ];  headaches[  ];  stroke[yes  ];  vertigo[   ];  seizures[  ];   paresthesias[  ];  difficulty walking[  ];  Psych:depression[  ]; anxiety[  ];  Endocrine: diabetes[  ];  thyroid dysfunction[  ];  Immunizations: Flu [  ]; Pneumococcal[  ];  Other:right-hand dominant  Physical Exam: BP 105/58 mmHg  Pulse 92  Temp(Src) 98.3 F (36.8 C) (Oral)  Resp 18  Ht '5\' 1"'$  (1.549 m)  Wt 186 lb 1.6 oz (84.414 kg)  BMI 35.18 kg/m2  SpO2 95%       Physical Exam  General: middle-aged Caucasian female sitting in a wheelchair fragile and with obvious left-sided stroke HEENT: Normocephalic pupils equal , dentition poor with several loose teeth and some probably necrotic teeth Neck: Supple without JVD, adenopathy, or bruit Chest: Clear but with diminished breath sounds at the bases, no rhonchi, no tenderness             or deformity Cardiovascular: Regular rate and rhythm, 3/6 holosystolic murmur of MR, no gallop, peripheral pulses         not    palpable in all extremities Abdomen:  Soft, nontender, no palpable mass or organomegaly Extremities: Warm, well-perfused, no clubbing cyanosis but with mild pedal edema , no calf tenderness              no venous stasis changes of the legs Rectal/GU: Deferred Neuro: left hemiparesis, left visual field deficit Skin: Clean and dry without rash or ulceration   Diagnostic Studies & Laboratory data:   Echocardiograms, CT scan of the chest and brain personally reviewed  Recent Radiology Findings:   Dg Chest Port 1 View  07/20/2015  CLINICAL DATA:  Hypoxia EXAM: PORTABLE CHEST 1 VIEW COMPARISON:  07/03/2015 FINDINGS: Cardiac shadow is stable. A right-sided PICC line is again seen in the distal superior vena cava. Increasing right medial lung base infiltrate is noted. Atelectatic changes are noted in the left retrocardiac region. IMPRESSION: Increasing right medial lung base infiltrate. Electronically Signed   By: Inez Catalina M.D.   On: 07/20/2015 21:27     I have independently reviewed the above  radiologic studies.  Recent Lab Findings: Lab Results  Component Value Date   WBC 8.7 07/20/2015   HGB 8.5* 07/20/2015   HCT 26.1* 07/20/2015   PLT 348 07/20/2015   GLUCOSE 129* 07/21/2015   CHOL 64 06/30/2015   TRIG 109 06/30/2015   HDL 16* 06/30/2015   LDLCALC 26 06/30/2015   ALT 18 07/08/2015   AST 18 07/08/2015   NA 134* 07/21/2015   K 3.3* 07/21/2015   CL 100* 07/21/2015   CREATININE 0.70 07/21/2015   BUN 8 07/21/2015   CO2 26 07/21/2015   INR 1.46 06/30/2015   HGBA1C 6.3* 06/30/2015      Assessment / Plan:     Severe mitral regurgitation secondary to endocarditis from Enterobacter bacteremia from urosepsis.1.5 cm vegetation of anterior leaflet remains  Left hemiparesis from large stroke with expected long-term severe deficit  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 I would recommend medical treatment of her mitral regurgitation. I believe that if  she had mitral valve surgery her survival would be worse. I discussed this with the patient and her husband and they understand.       '@ME1'$ @ 07/22/2015 11:51 AM

## 2015-07-22 NOTE — Progress Notes (Signed)
Subjective/Complaints: Appreciate ID and cardiology notes ROS- No SOB/CP No abd pain, - N/V, no blood in stool, dark stools  Objective: Vital Signs: Blood pressure 105/58, pulse 92, temperature 98.3 F (36.8 C), temperature source Oral, resp. rate 18, height _0  (1.549 m), weight 84.414 kg (186 lb 1.6 oz), SpO2 95 %. Dg Swallowing Func-speech Pathology  07/15/2015  Objective Swallowing Evaluation: Type of Study: MBS-Modified Barium Swallow Study Patient Details Name: Becky Patterson MRN: [id number] Date of Birth: 1956-03-09 Today's Date: 07/15/2015 Time: SLP Start Time (ACUTE ONLY): 1340 SLP Stop Time (ACUTE ONLY): 1410 SLP Time Calculation (min) (ACUTE ONLY): 30 min Past Medical History: Past Medical History Diagnosis Date . Lung mass  . Bacterial endocarditis  . Splenic infarction  . Epistaxis  Past Surgical History: Past Surgical History Procedure Laterality Date . Back surgery   . Tee without cardioversion N/A 06/24/2015   Procedure: TRANSESOPHAGEAL ECHOCARDIOGRAM (TEE);  Surgeon: Larey Dresser, MD;  Location: [place];  Service: Cardiovascular;  Laterality: N/A; . Radiology with anesthesia N/A 06/29/2015   Procedure: RADIOLOGY WITH ANESTHESIA;  Surgeon: Luanne Bras, MD;  Location: [place];  Service: Radiology;  Laterality: N/A; . Cholecystectomy   HPI: Becky Patterson is a 60 y.o. female history of infective endocarditis that was treated with discharge on 2/2, continuing on antibiotics at home, lung mass noted on that admission. Re-admitted with left sided weakness that progressed after admission. 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  Pt admitted to CIR on 07/07/15.  Tolerating Dys 3, honey thick liquids diet.  Repeat MBS ordered to objectively determine readiness to advance given improvements noted at bedside.  No Data Recorded Assessment / Plan / Recommendation CHL IP CLINICAL IMPRESSIONS 07/15/2015 Therapy Diagnosis Mild pharyngeal phase dysphagia;Mild oral  phase dysphagia Clinical Impression Pt presents with improved swallowing function and now presents with a mild oropharyngeal dysphagia with sensory>motor deficits.  Pt presents with left sided labial and lingual weakness which results in anterior loss of boluses, impaired mastication of solids, and prolonged posterior transit of boluses to the oropharynx.  Decreased pharyngeal sensation and base of tongue weakness resulted in poor bolus cohesion and premature spillage of materials into the pharynx with swallow response triggered at the vallecula with thickened liquids and solids, delayed to the pyriforms with thin liquids.  Delay in swallow initiation resulted in penetration of large boluses of nectar thick liquids and thin liquids.  Penetration of thin liquids was greater in amount and occurred deeper into the laryngeal vestibule, requiring increased cues for throat clear and extra swallows to clear.  Chin tuck ineffective largely due to cognitive deficits impacting pt's ability to successfully time strategy during the swallow.   Given pt's significant cognitive deficits, limited mobility, and persistent lethargy, recommend conservative advancement to Dys 3, nectar thick liquids with full supervision for use of swallowing precautions.  Pt would also benefit from implementation of the water protocol to continue working towards liquids advancement.  Prognosis for advancement good given progress made thus far with continued ST interventions for trials of advanced consistencies and use of compensatory swallowing strategies.   Impact on safety and function Mild aspiration risk     Prognosis 07/03/2015 Prognosis for Safe Diet Advancement Good Barriers to Reach Goals -- Barriers/Prognosis Comment -- CHL IP DIET RECOMMENDATION 07/15/2015 SLP Diet Recommendations Dysphagia 3 (Mech soft) solids;Nectar thick liquid Liquid Administration via Cup;No straw Medication Administration Crushed with puree Compensations Slow rate;Small  sips/bites;Lingual sweep for clearance of pocketing Postural Changes  Seated upright at 90 degrees            CHL IP ORAL PHASE 07/15/2015 Oral Phase Impaired Oral - Pudding Teaspoon -- Oral - Pudding Cup -- Oral - Honey Teaspoon -- Oral - Honey Cup Left anterior bolus loss;Delayed oral transit;Decreased bolus cohesion;Premature spillage;Lingual/palatal residue Oral - Nectar Teaspoon -- Oral - Nectar Cup Left anterior bolus loss;Delayed oral transit;Lingual/palatal residue;Decreased bolus cohesion;Premature spillage Oral - Nectar Straw Lingual/palatal residue;Decreased bolus cohesion;Premature spillage Oral - Thin Teaspoon -- Oral - Thin Cup Left anterior bolus loss;Lingual/palatal residue;Delayed oral transit;Decreased bolus cohesion;Premature spillage Oral - Thin Straw Lingual/palatal residue;Decreased bolus cohesion;Premature spillage Oral - Puree Lingual/palatal residue;Delayed oral transit;Premature spillage Oral - Mech Soft Lingual/palatal residue;Premature spillage;Delayed oral transit;Impaired mastication Oral - Regular -- Oral - Multi-Consistency -- Oral - Pill -- Oral Phase - Comment --  CHL IP PHARYNGEAL PHASE 07/15/2015 Pharyngeal Phase Impaired Pharyngeal- Pudding Teaspoon -- Pharyngeal -- Pharyngeal- Pudding Cup -- Pharyngeal -- Pharyngeal- Honey Teaspoon -- Pharyngeal -- Pharyngeal- Honey Cup Delayed swallow initiation-vallecula;Reduced tongue base retraction;Pharyngeal residue - valleculae Pharyngeal -- Pharyngeal- Nectar Teaspoon -- Pharyngeal -- Pharyngeal- Nectar Cup Delayed swallow initiation-vallecula;Reduced tongue base retraction;Pharyngeal residue - valleculae;Penetration/Aspiration during swallow Pharyngeal Material enters airway, remains ABOVE vocal cords then ejected out Pharyngeal- Nectar Straw -- Pharyngeal -- Pharyngeal- Thin Teaspoon -- Pharyngeal -- Pharyngeal- Thin Cup Delayed swallow initiation-pyriform sinuses;Reduced tongue base retraction;Penetration/Aspiration during  swallow;Pharyngeal residue - valleculae;Pharyngeal residue - pyriform Pharyngeal Material enters airway, remains ABOVE vocal cords and not ejected out Pharyngeal- Thin Straw Delayed swallow initiation-pyriform sinuses;Reduced tongue base retraction;Penetration/Aspiration during swallow;Pharyngeal residue - valleculae;Pharyngeal residue - pyriform Pharyngeal Material enters airway, remains ABOVE vocal cords and not ejected out Pharyngeal- Puree Delayed swallow initiation-vallecula;Reduced tongue base retraction Pharyngeal -- Pharyngeal- Mechanical Soft Delayed swallow initiation-vallecula;Reduced tongue base retraction Pharyngeal -- Pharyngeal- Regular -- Pharyngeal -- Pharyngeal- Multi-consistency -- Pharyngeal -- Pharyngeal- Pill -- Pharyngeal -- Pharyngeal Comment --  CHL IP CERVICAL ESOPHAGEAL PHASE 07/03/2015 Cervical Esophageal Phase WFL Pudding Teaspoon -- Pudding Cup -- Honey Teaspoon -- Honey Cup -- Nectar Teaspoon -- Nectar Cup -- Nectar Straw -- Thin Teaspoon -- Thin Cup -- Thin Straw -- Puree -- Mechanical Soft -- Regular -- Multi-consistency -- Pill -- Cervical Esophageal Comment -- No flowsheet data found. Page, Selinda Orion 07/15/2015, 4:17 PM              Results for orders placed or performed during the hospital encounter of 07/07/15 (from the past 72 hour(s))  Glucose, capillary     Status: Abnormal   Collection Time: 07/19/15 11:23 AM  Result Value Ref Range   Glucose-Capillary 148 (H) 65 - 99 mg/dL  Glucose, capillary     Status: Abnormal   Collection Time: 07/19/15  4:32 PM  Result Value Ref Range   Glucose-Capillary 127 (H) 65 - 99 mg/dL  Glucose, capillary     Status: Abnormal   Collection Time: 07/19/15  9:02 PM  Result Value Ref Range   Glucose-Capillary 129 (H) 65 - 99 mg/dL  CBC     Status: Abnormal   Collection Time: 07/20/15  5:11 AM  Result Value Ref Range   WBC 8.7 4.0 - 10.5 K/uL   RBC 2.79 (L) 3.87 - 5.11 MIL/uL   Hemoglobin 8.5 (L) 12.0 - 15.0 g/dL   HCT 26.1 (L) 36.0 -  46.0 %   MCV 93.5 78.0 - 100.0 fL   MCH 30.5 26.0 - 34.0 pg   MCHC 32.6 30.0 - 36.0 g/dL   RDW  15.8 (H) 11.5 - 15.5 %   Platelets 348 150 - 400 K/uL  Glucose, capillary     Status: Abnormal   Collection Time: 07/20/15  6:45 AM  Result Value Ref Range   Glucose-Capillary 138 (H) 65 - 99 mg/dL  Glucose, capillary     Status: Abnormal   Collection Time: 07/20/15 11:53 AM  Result Value Ref Range   Glucose-Capillary 127 (H) 65 - 99 mg/dL  Glucose, capillary     Status: Abnormal   Collection Time: 07/20/15  4:11 PM  Result Value Ref Range   Glucose-Capillary 137 (H) 65 - 99 mg/dL  Troponin I (q 6hr x 3)     Status: Abnormal   Collection Time: 07/20/15  6:20 PM  Result Value Ref Range   Troponin I 0.08 (H) <0.031 ng/mL    Comment:        PERSISTENTLY INCREASED TROPONIN VALUES IN THE RANGE OF 0.04-0.49 ng/mL CAN BE SEEN IN:       -UNSTABLE ANGINA       -CONGESTIVE HEART FAILURE       -MYOCARDITIS       -CHEST TRAUMA       -ARRYHTHMIAS       -LATE PRESENTING MYOCARDIAL INFARCTION       -COPD   CLINICAL FOLLOW-UP RECOMMENDED.   Glucose, capillary     Status: Abnormal   Collection Time: 07/20/15  9:04 PM  Result Value Ref Range   Glucose-Capillary 144 (H) 65 - 99 mg/dL  Troponin I (q 6hr x 3)     Status: Abnormal   Collection Time: 07/20/15 11:17 PM  Result Value Ref Range   Troponin I 0.09 (H) <0.031 ng/mL    Comment:        PERSISTENTLY INCREASED TROPONIN VALUES IN THE RANGE OF 0.04-0.49 ng/mL CAN BE SEEN IN:       -UNSTABLE ANGINA       -CONGESTIVE HEART FAILURE       -MYOCARDITIS       -CHEST TRAUMA       -ARRYHTHMIAS       -LATE PRESENTING MYOCARDIAL INFARCTION       -COPD   CLINICAL FOLLOW-UP RECOMMENDED.   Troponin I (q 6hr x 3)     Status: Abnormal   Collection Time: 07/21/15  5:13 AM  Result Value Ref Range   Troponin I 0.07 (H) <0.031 ng/mL    Comment:        PERSISTENTLY INCREASED TROPONIN VALUES IN THE RANGE OF 0.04-0.49 ng/mL CAN BE SEEN IN:        -UNSTABLE ANGINA       -CONGESTIVE HEART FAILURE       -MYOCARDITIS       -CHEST TRAUMA       -ARRYHTHMIAS       -LATE PRESENTING MYOCARDIAL INFARCTION       -COPD   CLINICAL FOLLOW-UP RECOMMENDED.   Basic metabolic panel     Status: Abnormal   Collection Time: 07/21/15  5:13 AM  Result Value Ref Range   Sodium 134 (L) 135 - 145 mmol/L   Potassium 3.3 (L) 3.5 - 5.1 mmol/L   Chloride 100 (L) 101 - 111 mmol/L   CO2 26 22 - 32 mmol/L   Glucose, Bld 129 (H) 65 - 99 mg/dL   BUN 8 6 - 20 mg/dL   Creatinine, Ser 0.70 0.44 - 1.00 mg/dL   Calcium 8.8 (L) 8.9 - 10.3 mg/dL   GFR calc non Af  Amer >60 >60 mL/min   GFR calc Af Amer >60 >60 mL/min    Comment: (NOTE) The eGFR has been calculated using the CKD EPI equation. This calculation has not been validated in all clinical situations. eGFR's persistently <60 mL/min signify possible Chronic Kidney Disease.    Anion gap 8 5 - 15  Glucose, capillary     Status: Abnormal   Collection Time: 07/21/15  6:56 AM  Result Value Ref Range   Glucose-Capillary 117 (H) 65 - 99 mg/dL  Glucose, capillary     Status: Abnormal   Collection Time: 07/21/15 12:33 PM  Result Value Ref Range   Glucose-Capillary 125 (H) 65 - 99 mg/dL   Comment 1 Notify RN   Sedimentation rate     Status: Abnormal   Collection Time: 07/21/15 12:58 PM  Result Value Ref Range   Sed Rate 102 (H) 0 - 22 mm/hr  C-reactive protein     Status: Abnormal   Collection Time: 07/21/15 12:58 PM  Result Value Ref Range   CRP 7.7 (H) <1.0 mg/dL  Glucose, capillary     Status: Abnormal   Collection Time: 07/21/15  4:11 PM  Result Value Ref Range   Glucose-Capillary 126 (H) 65 - 99 mg/dL   Comment 1 Notify RN   Culture, blood (Routine X 2) w Reflex to ID Panel     Status: None (Preliminary result)   Collection Time: 07/21/15  6:28 PM  Result Value Ref Range   Specimen Description BLOOD LEFT FOREARM    Special Requests BOTTLES DRAWN AEROBIC AND ANAEROBIC 5CC    Culture PENDING     Report Status PENDING   Glucose, capillary     Status: Abnormal   Collection Time: 07/21/15  8:48 PM  Result Value Ref Range   Glucose-Capillary 145 (H) 65 - 99 mg/dL  Sedimentation rate     Status: Abnormal   Collection Time: 07/22/15  4:25 AM  Result Value Ref Range   Sed Rate 101 (H) 0 - 22 mm/hr  C-reactive protein     Status: Abnormal   Collection Time: 07/22/15  4:25 AM  Result Value Ref Range   CRP 6.6 (H) <1.0 mg/dL  Glucose, capillary     Status: Abnormal   Collection Time: 07/22/15  6:53 AM  Result Value Ref Range   Glucose-Capillary 114 (H) 65 - 99 mg/dL     HEENT: normal Cardio: RRR and no murmur Resp: CTA B/L and unlabored GI: BS positive and NT, ND Extremity:  No Edema Skin:   Intact and Other no heel breakdown, no evidence of rash Neuro: Lethargic, Confused, Flat, Cranial Nerve Abnormalities Left CN central 7, Abnormal Sensory reduced pinch sensation in LUE and LLE, Abnormal Motor 0/5 LUE and LLE, Tone:  clonus in Left ankle, Dysarthric and Inattention, left field cut Musc/Skel:  Other no pain with LUE or LLE ROM, tenderness over the R trap. Left heel cord tight as well as finger flexors Gen NAD   Assessment/Plan: 1. Functional deficits secondary to Right MCA infarct with Left HP, Left neglect, Left hemisensory deficits which require 3+ hours per day of interdisciplinary therapy in a comprehensive inpatient rehab setting. Physiatrist is providing close team supervision and 24 hour management of active medical problems listed below. Physiatrist and rehab team continue to assess barriers to discharge/monitor patient progress toward functional and medical goals. FIM: Function - Bathing Bathing activity did not occur: N/A (night bath? and wanted to complete other activitiy today) Position:  (EOB and  supine) Body parts bathed by patient: Right upper leg, Left upper leg Body parts bathed by helper: Buttocks, Right lower leg, Left lower leg Bathing not applicable:  Right arm, Left arm, Chest, Abdomen, Front perineal area, Back (Pt declined UB bathing) Assist Level: 2 helpers  Function- Upper Body Dressing/Undressing Upper body dressing/undressing activity did not occur: N/A (already dressed for the day) What is the patient wearing?: Pull over shirt/dress Pull over shirt/dress - Perfomed by patient: Thread/unthread right sleeve Pull over shirt/dress - Perfomed by helper: Put head through opening, Thread/unthread left sleeve, Pull shirt over trunk Assist Level: Touching or steadying assistance(Pt > 75%) (modA) Function - Lower Body Dressing/Undressing Lower body dressing/undressing activity did not occur: N/A (already dressed for the day) What is the patient wearing?: Pants, Non-skid slipper socks Position: Bed Pants- Performed by patient: Thread/unthread right pants leg Pants- Performed by helper: Thread/unthread left pants leg, Pull pants up/down, Thread/unthread right pants leg Non-skid slipper socks- Performed by helper: Don/doff right sock, Don/doff left sock Socks - Performed by helper: Don/doff right sock, Don/doff left sock Shoes - Performed by patient: Don/doff right shoe Shoes - Performed by helper: Don/doff right shoe, Don/doff left shoe, Fasten right, Fasten left Assist for footwear: Dependant Assist for lower body dressing: 2 Helpers  Function - Toileting Toileting activity did not occur: No continent bowel/bladder event Assist level: Two helpers  Function - Air cabin crew transfer activity did not occur: Safety/medical concerns Assist level to toilet: 2 helpers Assist level from toilet: 2 helpers  Function - Chair/bed transfer Chair/bed transfer method: Lateral scoot Chair/bed transfer assist level: 2 helpers Chair/bed transfer assistive device: Sliding board, Armrests Mechanical lift: Maximove Chair/bed transfer details: Verbal cues for sequencing, Verbal cues for technique, Manual facilitation for weight shifting,  Manual facilitation for placement, Manual facilitation for weight bearing  Function - Locomotion: Wheelchair Will patient use wheelchair at discharge?: Yes Type: Manual (TIS) Max wheelchair distance: 150 Assist Level: Dependent (Pt equals 0%) Assist Level: Dependent (Pt equals 0%) Assist Level: Dependent (Pt equals 0%) Function - Locomotion: Ambulation Ambulation activity did not occur: Safety/medical concerns  Function - Comprehension Comprehension: Auditory Comprehension assist level: Understands basic 75 - 89% of the time/ requires cueing 10 - 24% of the time  Function - Expression Expression: Verbal Expression assist level: Expresses basic 75 - 89% of the time/requires cueing 10 - 24% of the time. Needs helper to occlude trach/needs to repeat words.  Function - Social Interaction Social Interaction assist level: Interacts appropriately 75 - 89% of the time - Needs redirection for appropriate language or to initiate interaction.  Function - Problem Solving Problem solving assist level: Solves basic 25 - 49% of the time - needs direction more than half the time to initiate, plan or complete simple activities  Function - Memory Memory assist level: Recognizes or recalls 25 - 49% of the time/requires cueing 50 - 75% of the time Patient normally able to recall (first 3 days only): Current season  Medical Problem List and Plan: 1.  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 No antithrombotic needed.Cont Abx thru 3/7, team conf in am - 2.  DVT Prophylaxis/Anticoagulation: Pharmaceutical: Lovenox,LE venous dopplers neg 3. Pain Management: Has chronic back pain per family. Will use tylenol prn for now  -topamax for postroke headache  -tramadol in place of  Percocet due to  itching  -overall improvement in sleep/headache 4. Mood: LCSW to follow for evaluation and support.   5.  Neuropsych: This patient is not fully capable  of making decisions on her own behalf. 6. Skin/Wound Care: routine pressure relief measures.   7. Fluids/Electrolytes/Nutrition: Monitor I/O.upgraded to D3 nectar, will cont Megace,Intake improved to 50-75%    Getting dehydrated with honey liquids.   IVF at nights and need to encourage fluid intake during the day. 8. 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 PET scan on outpatient basis. 10. Hypokalemia: K+ 3.3 2/26 will recheck in am increase KCL supplement,22mq  11. Cytotoxic edema: Weaned of hypertonic solution.    12. Endocarditis  Antibiotic-End date March 7th , now evidence that this has affected mitral valve causing regurg,elevated ESR she will cont ceftriaxone for her endocarditis for another week unless repeat cx show resistance  13. Anemia: Hgb low but has stayed in 7.6-8.0 range, check stool guaic was pos 1/23, neg on 2/2 and 2/16, Hgb stable at 8.5 2/26   14. Hypotension: Cont to monitor 15. Headache:, Tramadol [id number] prn,resolved 16.  Endocarditis with  17.  Mildly increased troponin of questionalble clinical significance,  Cardiology repeated ECHO with severe MR now noted, not good surgical candidate, poor functional status due to stroke will limit exercise and CO needs LOS (Days) 15 A FACE TO FACE EVALUATION WAS PERFORMED  Cicley Ganesh E 07/22/2015, 7:39 AM

## 2015-07-22 NOTE — Progress Notes (Signed)
Physical Therapy Session Note  Patient Details  Name: Becky Patterson MRN: [id number] Date of Birth: 1955/06/06  Today's Date: 07/22/2015 PT Individual Time: 3361-2244 PT Individual Time Calculation (min): 36 min   Short Term Goals: Week 1:  PT Short Term Goal 1 (Week 1): pt will maintain static sitting balance with min A PT Short Term Goal 1 - Progress (Week 1): Not met PT Short Term Goal 2 (Week 1): Pt will perform functional transfers with max A PT Short Term Goal 2 - Progress (Week 1): Not met PT Short Term Goal 3 (Week 1): pt will attend to therapeutic task x 1 minute with min cuing PT Short Term Goal 3 - Progress (Week 1): Not met Week 2:  PT Short Term Goal 1 (Week 2): pt will maintain static sitting balance with min A PT Short Term Goal 2 (Week 2): pt will perform functional transfers with max A PT Short Term Goal 3 (Week 2): pt will attend to therapeutic task x 1 minute with min cuing  Skilled Therapeutic Interventions/Progress Updates:    Patient received semirecumbent in bed. Max A for supine to sitting on L side of bed. Patient instructed in sitting balance x 15 minutes with constant cues for correct for L LOB and altered hand position to prevent RUE pushing to L side. Pt instructed in reaching L and R with R UE while maintaining seated position 2x 10 reps. PT was required to instruct patient for constant visual field scanning to the L to find  Target as well as to engage core to prevent L lateral LOB. Patient demonstrated adequate L sided core activation with reaching to R outside of BOS. Max A for Sit to supine position in bed. Patient performed Roll to R with Max A from PT and cues for proper positioning of L LE and UE x 2   Therapy Documentation Precautions:  Precautions Precautions: Fall Precaution Comments: Lt neglect , poor trunk control, decreased awareness Restrictions Weight Bearing Restrictions: No General:   Vital Signs: Therapy Vitals Temp: 98.8 F (37.1  C) Temp Source: Oral Pulse Rate: 99 Resp: (!) 22 BP: (!) 147/86 mmHg Patient Position (if appropriate): Lying Oxygen Therapy SpO2: 99 % O2 Device: Not Delivered Pain: Pain Assessment Pain Assessment: No/denies pain  See Function Navigator for Current Functional Status.   Therapy/Group: Individual Therapy  Lorie Phenix 07/22/2015, 4:40 PM

## 2015-07-22 NOTE — Progress Notes (Signed)
Recreational Therapy Assessment and Plan  Patient Details  Name: Becky Patterson MRN: [id number] Date of Birth: July 29, 1955 Today's Date: 07/22/2015  Rehab Potential: Good ELOS: 2 weeks   Assessment Clinical Impression:  Problem List:  Patient Active Problem List   Diagnosis Date Noted  . Acute right MCA stroke ([place]) 07/07/2015  . Intracranial septic embolism ([place])   . Hemiplegia affecting left nondominant side ([place])   . Dysphagia   . Alteration of sensation as late effect of stroke   . Dehydration   . Bacterial endocarditis   . Maculopapular rash   . Acute blood loss anemia   . Other vascular headache   . Other complicated headache syndrome   . 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 Left hemiparesis ([place]) 07/03/2015  . 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 Visual field cut 07/03/2015  . Palliative care encounter   . DNR (do not resuscitate) discussion   . Acute respiratory failure with hypoxia ([place]) 06/30/2015  . Arterial hypotension 06/30/2015  . Acute encephalopathy   . Stroke due to embolism ([place])   . Stroke (cerebrum) ([place]) 06/29/2015  . Colitis   . Pyelonephritis   . Enterococcal bacteremia 06/19/2015  . Sepsis secondary to UTI ([place])   . Mass of lower lobe of left lung   . Tobacco abuse   . Anemia of chronic disease   . Hyponatremia   . Hyperglycemia   . Hypokalemia   . Lung mass 06/15/2015    Past Medical History:  Past Medical History  Diagnosis Date  . Lung mass   . Bacterial endocarditis   . Splenic infarction   . Epistaxis    Past Surgical History:  Past Surgical History  Procedure Laterality Date  . Back surgery    . Tee without cardioversion N/A 06/24/2015    Procedure: TRANSESOPHAGEAL ECHOCARDIOGRAM (TEE); Surgeon: Larey Dresser, MD; Location: [place]; Service: Cardiovascular; Laterality: N/A;   . Radiology with anesthesia N/A 06/29/2015    Procedure: RADIOLOGY WITH ANESTHESIA; Surgeon: Luanne Bras, MD; Location: [place]; Service: Radiology; Laterality: N/A;  . Cholecystectomy      Assessment & Plan Clinical Impression: Patient is a 60 y.o. year old female with recent admission to the hospital on 06/29/15 with acute onset of left sided weakness with slurred speech and left neglect. UDS positive for THC. CT head negative and she underwent CTA brain showed M1 occlusion and Dr. Estanislado Pandy attempted revascularization of the right MCA occlusion with Solitare and IA Integrelin. She required fluid boluses due to hypotension. . Patient transferred to CIR on 07/07/2015.   Pt presents with decreased activity tolerance, decreased functional mobility, decreased balance,decreased attention, decreased awareness, decreased problem solving, decreased safety awareness, decreased memory and delayed processing decreased coordination, left neglect,  Limiting pt's independence with leisure/community pursuits.       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) Social interaction - Mood/Behavior: [place] Appropriate for Education?: Yes Recreational Therapy Orientation Orientation -Reviewed with patient: Available activity resources Strengths/Weaknesses Patient Strengths/Abilities: Willingness to participate;Active premorbidly Patient weaknesses: Physical limitations;Minimal  Premorbid Leisure Activity TR Patient demonstrates impairments in the following area(s): Edema;Endurance;Pain;Perception;Safety;Skin Integrity  Plan Rec Therapy Plan Is  patient appropriate for Therapeutic Recreation?: Yes Rehab Potential: Good Treatment times per week: min 1 time per week >20 minutes Estimated Length of Stay: 2 weeks TR Treatment/Interventions: Adaptive equipment instruction;1:1 session;Balance/vestibular training;Functional mobility training;Community reintegration;Cognitive remediation/compensation;Leisure education;Patient/family education;Therapeutic activities;Recreation/leisure participation;Therapeutic exercise;UE/LE Coordination activities Recommendations for other services: Neuropsych  Recommendations for other services: Neuropsych  Discharge Criteria: Patient will be discharged from TR if patient refuses treatment 3 consecutive times without medical reason.  If treatment goals not met, if there is a change in medical status, if patient makes no progress towards goals or if patient is discharged from hospital.  The above assessment, treatment plan, treatment alternatives and goals were discussed and mutually agreed upon: by patient  [place] 07/22/2015, 3:34 PM

## 2015-07-22 NOTE — Progress Notes (Signed)
Speech Language Pathology Weekly Progress and Session Note  Patient Details  Name: Becky Patterson MRN: [id number] Date of Birth: 09/18/1955  Beginning of progress report period: July 16, 2015   End of progress report period: July 22, 2015   Today's Date: 07/22/2015 SLP Individual Time: 0800-0900 SLP Individual Time Calculation (min): 60 min  Short Term Goals: Week 2: SLP Short Term Goal 1 (Week 2): Pt will attend to the left in >75% of observable opportunities with max assist verbal cues.   SLP Short Term Goal 1 - Progress (Week 2): Met SLP Short Term Goal 2 (Week 2): Pt will sustain her attention to basic familiar tasks for 3-5 minute intervals with mod assist verbal cues for redirection.  SLP Short Term Goal 2 - Progress (Week 2): Met SLP Short Term Goal 3 (Week 2): Pt will consume dys 3 textures and nectar thick liquids with min verbal cues for use of swallowing precautions.   SLP Short Term Goal 3 - Progress (Week 2): Met SLP Short Term Goal 4 (Week 2): Pt willl consume trials of water via cup sips as part of the water protocol with minimal overt s/s of aspiration for 3 consecutive sessions with min verbal cues for use of swallowing precautions.  SLP Short Term Goal 4 - Progress (Week 2): Met SLP Short Term Goal 5 (Week 2): Pt will identify at least 2 deficits occurring s/p CVA with mod question cues.   SLP Short Term Goal 5 - Progress (Week 2): Progressing toward goal    New Short Term Goals: Week 3: SLP Short Term Goal 1 (Week 3): Pt will attend to the left in >75% of observable opportunities with min assist verbal cues.   SLP Short Term Goal 2 (Week 3): Pt will sustain her attention to basic familiar tasks for 15 minute intervals with mod assist verbal cues for redirection.  SLP Short Term Goal 3 (Week 3): Pt will consume dys 3 textures and nectar thick liquids with supervision cues for use of swallowing precautions.   SLP Short Term Goal 4 (Week 3): Pt willl consume  trials of water via cup sips as part of the water protocol with minimal overt s/s of aspiration for 3 consecutive sessions with supervision cues for use of swallowing precautions.  SLP Short Term Goal 5 (Week 3): Pt will identify at least 2 deficits occurring s/p CVA with mod question cues.    Weekly Progress Updates:  Pt made functional gains this reporting period and has met 4 out of 5 short term goals.  Pt is consuming Dys 3 textures and nectar thick liquids as well as trials of unthickened water outside of meals per the water protocol with min assist use of swallowing precautions.  Pt currently requires mod assist during basic cognitive tasks due to moderately severe cognitive deficits consistent with right brain dysfunction.  Pt has demonstrated noticeable improvement in visual scanning to the left of midline and sustained attention during structured tasks in a controlled environment but continues to require cues for carryover into functional tasks.  Pt would continue to benefit from skilled ST while inpatient in order to maximize functional independence and reduce burden of care prior to discharge.  Anticipate that pt will need close 24/7 supervision at discharge in addition to [place] follow up at next level of care.  Pt and family education is ongoing.     Intensity: Minumum of 1-2 x/day, 30 to 90 minutes Frequency: 3 to 5 out of 7 days Duration/Length  of Stay: 21-28 days  Treatment/Interventions: Cognitive remediation/compensation;Cueing hierarchy;Functional tasks;Environmental controls;Dysphagia/aspiration precaution training;Internal/external aids;Patient/family education   Daily Session  Skilled Therapeutic Interventions:  Pt was seen for skilled ST targeting goals for dysphagia and cognition.  Pt asleep upon arrival, sister reported poor sleep last night due to neck pain.  Pt easily awakened and agreeable to participate in [place].  Pt was transferred to wheelchair with [facility] and nurse tech  assistance.  Pt required max assist verbal cues to ask for assistance as needed when opening containers during tray set up for breakfast.  Pt also required max assist question cues to identify 1 deficit occurring s/p CVA.  Pt was able to locate items on the left of her meal tray with mod assist verbal cues.  Pt required min assist verbal cues for rate and portion control when consuming dys 3 textures and nectar thick liquids.  No overt s/s of aspiration were evident with solids or liquids and pt was able to clear residual solids from the oral cavity post swallow with digital assist, liquid wash, and lingual sweep.  Pt sustained her attention to self feeding task for 3-5 minute intervals with min verbal cues for redirection.  Pt was left in wheelchair with call bell within reach, quick release belt donned for safety, and husband at bedside.  Goals updated on this date to reflect current progress and plan of care.     Function:   Eating Eating   Modified Consistency Diet: Yes Eating Assist Level: Supervision or verbal cues;Set up assist for   Eating Set Up Assist For: Opening containers       Cognition Comprehension Comprehension assist level: Understands basic 75 - 89% of the time/ requires cueing 10 - 24% of the time  Expression   Expression assist level: Expresses basic 75 - 89% of the time/requires cueing 10 - 24% of the time. Needs helper to occlude trach/needs to repeat words.  Social Interaction Social Interaction assist level: Interacts appropriately 75 - 89% of the time - Needs redirection for appropriate language or to initiate interaction.  Problem Solving Problem solving assist level: Solves basic 25 - 49% of the time - needs direction more than half the time to initiate, plan or complete simple activities  Memory Memory assist level: Recognizes or recalls 25 - 49% of the time/requires cueing 50 - 75% of the time   General    Pain Pain Assessment Pain Assessment: No/denies  pain  Therapy/Group: Individual Therapy  Jonnell Hentges, Selinda Orion 07/22/2015, 12:23 PM

## 2015-07-22 NOTE — Progress Notes (Signed)
Occupational Therapy Weekly Progress Note  Patient Details  Name: Becky Patterson MRN: [id number] Date of Birth: 1956/04/22  Beginning of progress report period: July 14, 2015 End of progress report period: July 22, 2015  Today's Date: 07/22/2015 OT Individual Time: 4034-7425 OT Individual Time Calculation (min): 85 min    Patient has met 2 of 5 short term goals.  Pt is making gradual progress with her sitting balance which will enable her to sit more safely on BSC. New goals for [facility] transfer have been set for this week. She is progressing with her transfers and L scanning but was not able to meet those STGs. She did not meet LUE goal as she continues to have dense hemiplegia.  Patient continues to demonstrate the following deficits: dense L hemiplegia, L neglect, impaired trunk control, decreased attention and awareness and therefore will continue to benefit from skilled OT intervention to enhance overall performance with BADL and Reduce care partner burden.  Patient progressing toward long term goals..  Continue plan of care.  OT Short Term Goals Week 1:  OT Short Term Goal 1 (Week 1): Pt will maintain static sitting balance EOB for 2 mins with close supervision in preparation for selfcare tasks.  OT Short Term Goal 1 - Progress (Week 1): Not met OT Short Term Goal 2 (Week 1): Pt will complete UB bathing and dressing sitting supported with min assist. OT Short Term Goal 2 - Progress (Week 1): Not met OT Short Term Goal 3 (Week 1): Pt will transfer squat pivot to the drop arm commode with max assist of 1 person.  OT Short Term Goal 3 - Progress (Week 1): Not met OT Short Term Goal 4 (Week 1): Pt will locate all grooming and bathing items placed left of midline with no more than mod instructional cueing. OT Short Term Goal 4 - Progress (Week 1): Not met OT Short Term Goal 5 (Week 1): Pt's spouse will return demonstrate safe assist with LUE PROM exercises following handout.   OT  Short Term Goal 5 - Progress (Week 1): Met Week 2:  OT Short Term Goal 1 (Week 2): Pt will maintain static sitting balance EOB for 2 mins with close supervision in preparation for selfcare tasks.  OT Short Term Goal 1 - Progress (Week 2): Met OT Short Term Goal 2 (Week 2): Pt will complete UB bathing and dressing sitting supported with min assist. OT Short Term Goal 2 - Progress (Week 2): Met OT Short Term Goal 3 (Week 2): Pt will transfer squat pivot to the drop arm commode with max assist of 1 person.  OT Short Term Goal 3 - Progress (Week 2): Progressing toward goal OT Short Term Goal 4 (Week 2): Pt will locate all grooming and bathing items placed left of midline with no more than mod instructional cueing. OT Short Term Goal 4 - Progress (Week 2): Progressing toward goal OT Short Term Goal 5 (Week 2): Pt will use the LUE during bathing sessions with max assist.   OT Short Term Goal 5 - Progress (Week 2): Not progressing Week 3:  OT Short Term Goal 1 (Week 3): Pt will transfer to drop arm BSC with max A x 1. OT Short Term Goal 2 (Week 3): Pt will don shirt with min A. OT Short Term Goal 3 (Week 3): Pt will bathe will mod A sitting on BSC.  Skilled Therapeutic Interventions/Progress Updates:    Pt received in w/c stating she did not need to  bathe but did want to do oral care. Pt took extensive time brushing and cleansing mouth, but then only took 2 sips of water.  Pt taken to gym to work on sitting balance. Asked pt's husband to assist by stabilizing her shoulders and the sliding board as this therapist provided max A to use board to transfer to mat. Pt participated well by sitting for over 40 min working on balance holding herself in static position for several minutes at a time. Min -mod A to support balance as she fully reached her R arm around to L side to place cards on board to her L. Worked on L scanning with max cues each time to fully turn her head to the L.  LUE wt bearing with reaching.  Pt needs max cues to self correct balance but is able to as long as she is not leaning too far to L. Pt used board back to chair with spouse providing stabilizing A as therapist provided max A.  Pt taken back to room with spouse and all needs met.  Therapy Documentation Precautions:  Precautions Precautions: Fall Precaution Comments: Lt neglect , poor trunk control, decreased awareness Restrictions Weight Bearing Restrictions: No      Pain: Pain Assessment Pain Assessment: No/denies pain ADL:    See Function Navigator for Current Functional Status.   Therapy/Group: Individual Therapy  Shenekia Riess 07/22/2015, 12:55 PM

## 2015-07-23 ENCOUNTER — Inpatient Hospital Stay (HOSPITAL_COMMUNITY): Payer: 59 | Admitting: Speech Pathology

## 2015-07-23 ENCOUNTER — Inpatient Hospital Stay (HOSPITAL_COMMUNITY): Payer: 59 | Admitting: Occupational Therapy

## 2015-07-23 ENCOUNTER — Inpatient Hospital Stay (HOSPITAL_COMMUNITY): Payer: 59 | Admitting: Physical Therapy

## 2015-07-23 DIAGNOSIS — I340 Nonrheumatic mitral (valve) insufficiency: Secondary | ICD-10-CM

## 2015-07-23 LAB — GLUCOSE, CAPILLARY
GLUCOSE-CAPILLARY: 174 mg/dL — AB (ref 65–99)
Glucose-Capillary: 102 mg/dL — ABNORMAL HIGH (ref 65–99)
Glucose-Capillary: 110 mg/dL — ABNORMAL HIGH (ref 65–99)
Glucose-Capillary: 104 mg/dL — ABNORMAL HIGH (ref 65–99)

## 2015-07-23 MED ORDER — PAROXETINE HCL 20 MG PO TABS
20.0000 mg | ORAL_TABLET | Freq: Every day | ORAL | Status: DC
  Filled 2015-07-23: qty 1

## 2015-07-23 MED ORDER — MIRTAZAPINE 15 MG PO TABS
7.5000 mg | ORAL_TABLET | Freq: Every day | ORAL | Status: DC
  Administered 2015-07-23 – 2015-07-24 (×2): 7.5 mg via ORAL
  Filled 2015-07-23 (×2): qty 1

## 2015-07-23 NOTE — Patient Care Conference (Signed)
Inpatient RehabilitationTeam Conference and Plan of Care Update Date: 07/23/2015   Time: 10:35 AM    Patient Name: Becky Patterson      Medical Record Number: [id number]  Date of Birth: 06-Aug-1955 Sex: Female         Room/Bed: 4W18C/4W18C-01 Payor Info: Payor: Theme park manager / Plan: [employer] OTHER / Product Type: *No Product type* /    Admitting Diagnosis: RT CVA  Admit Date/Time:  07/07/2015  3:54 PM Admission Comments: No comment available   Primary Diagnosis:  <principal problem not specified> Principal Problem: <principal problem not specified>  Patient Active Problem List   Diagnosis Date Noted  . Right-sided headache   . Septic embolism ([place])   . Severe mitral regurgitation   . Cerebrovascular accident (CVA) due to embolism of right middle cerebral artery ([place])   . Acute right MCA stroke ([place]) 07/07/2015  . Intracranial septic embolism ([place])   . Hemiplegia affecting left nondominant side ([place])   . Dysphagia   . Alteration of sensation as late effect of stroke   . Dehydration   . Bacterial endocarditis   . Maculopapular rash   . Acute blood loss anemia   . Other vascular headache   . Other complicated headache syndrome   . 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 Left hemiparesis ([place]) 07/03/2015  . 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 Visual field cut 07/03/2015  . Palliative care encounter   . DNR (do not resuscitate) discussion   . Acute respiratory failure with hypoxia ([place]) 06/30/2015  . Arterial hypotension 06/30/2015  . Acute encephalopathy   . Stroke due to embolism ([place])   . Stroke (cerebrum) ([place]) 06/29/2015  . Colitis   . Pyelonephritis   . Enterococcal bacteremia 06/19/2015  . Sepsis secondary to UTI ([place])   . Mass of lower lobe of left lung   . Tobacco abuse   . Anemia of chronic disease   . Hyponatremia   . Hyperglycemia   . Hypokalemia   . Lung mass 06/15/2015    Expected Discharge Date: Expected Discharge Date:  08/02/15  Team Members Present: Physician leading conference: Dr. Alysia Penna Social Worker Present: Ovidio Kin, LCSW Nurse Present: Heather Roberts, RN PT Present: Jorge Mandril, PT OT Present: Clyda Greener, OT SLP Present: Windell Moulding, SLP PPS Coordinator present : Daiva Nakayama, RN, CRRN     Current Status/Progress Goal Weekly Team Focus  Medical   Repeat MRI no new infarcts, Has MR, litle neuro recovery  home with therapy  Cont IV abx   Bowel/Bladder   Continent of bowel and bladder. LBM 07/22/15  Pt to remain continent of bowel and bladder  Monitor   Swallow/Nutrition/ Hydration   Dys 3, nectar thick liquids; on the water protocol, min assist for use of swallowing precaution   supervision with least restrictive diet   trials of thin liquids per the water protocol, improved use of swallowing precautions    ADL's   mod-max A UB, max -total LB self care, LUE flaccid exept trace in sh elevation, max slide board +2 for safety  min to mod assist for most selfcare tasks  ADL retraining, functional mobility, balance training, pt/family education   Mobility   total a transfers, max a sitting balance  min - mod a  balance attention, functional mobility, pt/family education   Communication     na        Safety/Cognition/ Behavioral Observations  Mod assist for basic tasks   min assist for  basic   continue to address basic cognition    Pain   Tylenol '650mg'$  q 4hrs for occasional headache  <3  Assess for effectivess of pain medication   Skin      Turn q 2hrs  Assess skin q shift and initiate appropriate intervention if needed      *See Care Plan and progress notes for long and short-term goals.  Barriers to Discharge: total assist    Possible Resolutions to Barriers:  will need family training for lifts    Discharge Planning/Teaching Needs:  Husband, sister and daughter have been here to observe in therapies, continue to plan for pt ot go home, aware she will be much care       Team Discussion:  Progress is very slow and with minimal gains. Still requiring plus two assist with a lift for safety. Cardiologist saw on Sunday due to heart issues-on IV antibiotics now until 3/20. Ritalin is helping making more responsive in a timely manner. Start having family learn the hoyer lift and have team address amount of care pt will require-bed level at home.  Revisions to Treatment Plan: Question if family can do 24 hr physical care with lift and two people   Continued Need for Acute Rehabilitation Level of Care: The patient requires daily medical management by a physician with specialized training in physical medicine and rehabilitation for the following conditions: Daily direction of a multidisciplinary physical rehabilitation program to ensure safe treatment while eliciting the highest outcome that is of practical value to the patient.: Yes Daily medical management of patient stability for increased activity during participation in an intensive rehabilitation regime.: Yes Daily analysis of laboratory values and/or radiology reports with any subsequent need for medication adjustment of medical intervention for : Neurological problems;Cardiac problems;Pulmonary problems  Dharma Pare, Gardiner Rhyme 07/23/2015, 1:05 PM

## 2015-07-23 NOTE — Progress Notes (Signed)
Subjective/Complaints: Appreciate ID and cardiology notes, reviewed MRI, expected changes, no new embolic infarcts ROS- No SOB/CP No abd pain, - N/V, no blood in stool, dark stools  Objective: Vital Signs: Blood pressure 101/64, pulse 89, temperature 99.1 F (37.3 C), temperature source Oral, resp. rate 20, height '5\' 1"'  (1.549 m), weight 79.516 kg (175 lb 4.8 oz), SpO2 97 %. Dg Swallowing Func-speech Pathology  07/15/2015  Objective Swallowing Evaluation: Type of Study: MBS-Modified Barium Swallow Study Patient Details Name: Becky Patterson MRN: [id number] Date of Birth: August 06, 1955 Today's Date: 07/15/2015 Time: SLP Start Time (ACUTE ONLY): 1340 SLP Stop Time (ACUTE ONLY): 1410 SLP Time Calculation (min) (ACUTE ONLY): 30 min Past Medical History: Past Medical History Diagnosis Date . Lung mass  . Bacterial endocarditis  . Splenic infarction  . Epistaxis  Past Surgical History: Past Surgical History Procedure Laterality Date . Back surgery   . Tee without cardioversion N/A 06/24/2015   Procedure: TRANSESOPHAGEAL ECHOCARDIOGRAM (TEE);  Surgeon: Larey Dresser, MD;  Location: [place];  Service: Cardiovascular;  Laterality: N/A; . Radiology with anesthesia N/A 06/29/2015   Procedure: RADIOLOGY WITH ANESTHESIA;  Surgeon: Luanne Bras, MD;  Location: [place];  Service: Radiology;  Laterality: N/A; . Cholecystectomy   HPI: Becky Patterson is a 60 y.o. female history of infective endocarditis that was treated with discharge on 2/2, continuing on antibiotics at home, lung mass noted on that admission. Re-admitted with left sided weakness that progressed after admission. 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  Pt admitted to CIR on 07/07/15.  Tolerating Dys 3, honey thick liquids diet.  Repeat MBS ordered to objectively determine readiness to advance given improvements noted at bedside.  No Data Recorded Assessment / Plan / Recommendation CHL IP CLINICAL IMPRESSIONS 07/15/2015  Therapy Diagnosis Mild pharyngeal phase dysphagia;Mild oral phase dysphagia Clinical Impression Pt presents with improved swallowing function and now presents with a mild oropharyngeal dysphagia with sensory>motor deficits.  Pt presents with left sided labial and lingual weakness which results in anterior loss of boluses, impaired mastication of solids, and prolonged posterior transit of boluses to the oropharynx.  Decreased pharyngeal sensation and base of tongue weakness resulted in poor bolus cohesion and premature spillage of materials into the pharynx with swallow response triggered at the vallecula with thickened liquids and solids, delayed to the pyriforms with thin liquids.  Delay in swallow initiation resulted in penetration of large boluses of nectar thick liquids and thin liquids.  Penetration of thin liquids was greater in amount and occurred deeper into the laryngeal vestibule, requiring increased cues for throat clear and extra swallows to clear.  Chin tuck ineffective largely due to cognitive deficits impacting pt's ability to successfully time strategy during the swallow.   Given pt's significant cognitive deficits, limited mobility, and persistent lethargy, recommend conservative advancement to Dys 3, nectar thick liquids with full supervision for use of swallowing precautions.  Pt would also benefit from implementation of the water protocol to continue working towards liquids advancement.  Prognosis for advancement good given progress made thus far with continued ST interventions for trials of advanced consistencies and use of compensatory swallowing strategies.   Impact on safety and function Mild aspiration risk     Prognosis 07/03/2015 Prognosis for Safe Diet Advancement Good Barriers to Reach Goals -- Barriers/Prognosis Comment -- CHL IP DIET RECOMMENDATION 07/15/2015 SLP Diet Recommendations Dysphagia 3 (Mech soft) solids;Nectar thick liquid Liquid Administration via Cup;No straw Medication  Administration Crushed with puree Compensations Slow rate;Small  sips/bites;Lingual sweep for clearance of pocketing Postural Changes Seated upright at 90 degrees            CHL IP ORAL PHASE 07/15/2015 Oral Phase Impaired Oral - Pudding Teaspoon -- Oral - Pudding Cup -- Oral - Honey Teaspoon -- Oral - Honey Cup Left anterior bolus loss;Delayed oral transit;Decreased bolus cohesion;Premature spillage;Lingual/palatal residue Oral - Nectar Teaspoon -- Oral - Nectar Cup Left anterior bolus loss;Delayed oral transit;Lingual/palatal residue;Decreased bolus cohesion;Premature spillage Oral - Nectar Straw Lingual/palatal residue;Decreased bolus cohesion;Premature spillage Oral - Thin Teaspoon -- Oral - Thin Cup Left anterior bolus loss;Lingual/palatal residue;Delayed oral transit;Decreased bolus cohesion;Premature spillage Oral - Thin Straw Lingual/palatal residue;Decreased bolus cohesion;Premature spillage Oral - Puree Lingual/palatal residue;Delayed oral transit;Premature spillage Oral - Mech Soft Lingual/palatal residue;Premature spillage;Delayed oral transit;Impaired mastication Oral - Regular -- Oral - Multi-Consistency -- Oral - Pill -- Oral Phase - Comment --  CHL IP PHARYNGEAL PHASE 07/15/2015 Pharyngeal Phase Impaired Pharyngeal- Pudding Teaspoon -- Pharyngeal -- Pharyngeal- Pudding Cup -- Pharyngeal -- Pharyngeal- Honey Teaspoon -- Pharyngeal -- Pharyngeal- Honey Cup Delayed swallow initiation-vallecula;Reduced tongue base retraction;Pharyngeal residue - valleculae Pharyngeal -- Pharyngeal- Nectar Teaspoon -- Pharyngeal -- Pharyngeal- Nectar Cup Delayed swallow initiation-vallecula;Reduced tongue base retraction;Pharyngeal residue - valleculae;Penetration/Aspiration during swallow Pharyngeal Material enters airway, remains ABOVE vocal cords then ejected out Pharyngeal- Nectar Straw -- Pharyngeal -- Pharyngeal- Thin Teaspoon -- Pharyngeal -- Pharyngeal- Thin Cup Delayed swallow initiation-pyriform sinuses;Reduced  tongue base retraction;Penetration/Aspiration during swallow;Pharyngeal residue - valleculae;Pharyngeal residue - pyriform Pharyngeal Material enters airway, remains ABOVE vocal cords and not ejected out Pharyngeal- Thin Straw Delayed swallow initiation-pyriform sinuses;Reduced tongue base retraction;Penetration/Aspiration during swallow;Pharyngeal residue - valleculae;Pharyngeal residue - pyriform Pharyngeal Material enters airway, remains ABOVE vocal cords and not ejected out Pharyngeal- Puree Delayed swallow initiation-vallecula;Reduced tongue base retraction Pharyngeal -- Pharyngeal- Mechanical Soft Delayed swallow initiation-vallecula;Reduced tongue base retraction Pharyngeal -- Pharyngeal- Regular -- Pharyngeal -- Pharyngeal- Multi-consistency -- Pharyngeal -- Pharyngeal- Pill -- Pharyngeal -- Pharyngeal Comment --  CHL IP CERVICAL ESOPHAGEAL PHASE 07/03/2015 Cervical Esophageal Phase WFL Pudding Teaspoon -- Pudding Cup -- Honey Teaspoon -- Honey Cup -- Nectar Teaspoon -- Nectar Cup -- Nectar Straw -- Thin Teaspoon -- Thin Cup -- Thin Straw -- Puree -- Mechanical Soft -- Regular -- Multi-consistency -- Pill -- Cervical Esophageal Comment -- No flowsheet data found. Page, Selinda Orion 07/15/2015, 4:17 PM              Results for orders placed or performed during the hospital encounter of 07/07/15 (from the past 72 hour(s))  Glucose, capillary     Status: Abnormal   Collection Time: 07/20/15 11:53 AM  Result Value Ref Range   Glucose-Capillary 127 (H) 65 - 99 mg/dL  Glucose, capillary     Status: Abnormal   Collection Time: 07/20/15  4:11 PM  Result Value Ref Range   Glucose-Capillary 137 (H) 65 - 99 mg/dL  Troponin I (q 6hr x 3)     Status: Abnormal   Collection Time: 07/20/15  6:20 PM  Result Value Ref Range   Troponin I 0.08 (H) <0.031 ng/mL    Comment:        PERSISTENTLY INCREASED TROPONIN VALUES IN THE RANGE OF 0.04-0.49 ng/mL CAN BE SEEN IN:       -UNSTABLE ANGINA       -CONGESTIVE HEART  FAILURE       -MYOCARDITIS       -CHEST TRAUMA       -ARRYHTHMIAS       -LATE PRESENTING  MYOCARDIAL INFARCTION       -COPD   CLINICAL FOLLOW-UP RECOMMENDED.   Glucose, capillary     Status: Abnormal   Collection Time: 07/20/15  9:04 PM  Result Value Ref Range   Glucose-Capillary 144 (H) 65 - 99 mg/dL  Troponin I (q 6hr x 3)     Status: Abnormal   Collection Time: 07/20/15 11:17 PM  Result Value Ref Range   Troponin I 0.09 (H) <0.031 ng/mL    Comment:        PERSISTENTLY INCREASED TROPONIN VALUES IN THE RANGE OF 0.04-0.49 ng/mL CAN BE SEEN IN:       -UNSTABLE ANGINA       -CONGESTIVE HEART FAILURE       -MYOCARDITIS       -CHEST TRAUMA       -ARRYHTHMIAS       -LATE PRESENTING MYOCARDIAL INFARCTION       -COPD   CLINICAL FOLLOW-UP RECOMMENDED.   Troponin I (q 6hr x 3)     Status: Abnormal   Collection Time: 07/21/15  5:13 AM  Result Value Ref Range   Troponin I 0.07 (H) <0.031 ng/mL    Comment:        PERSISTENTLY INCREASED TROPONIN VALUES IN THE RANGE OF 0.04-0.49 ng/mL CAN BE SEEN IN:       -UNSTABLE ANGINA       -CONGESTIVE HEART FAILURE       -MYOCARDITIS       -CHEST TRAUMA       -ARRYHTHMIAS       -LATE PRESENTING MYOCARDIAL INFARCTION       -COPD   CLINICAL FOLLOW-UP RECOMMENDED.   Basic metabolic panel     Status: Abnormal   Collection Time: 07/21/15  5:13 AM  Result Value Ref Range   Sodium 134 (L) 135 - 145 mmol/L   Potassium 3.3 (L) 3.5 - 5.1 mmol/L   Chloride 100 (L) 101 - 111 mmol/L   CO2 26 22 - 32 mmol/L   Glucose, Bld 129 (H) 65 - 99 mg/dL   BUN 8 6 - 20 mg/dL   Creatinine, Ser 0.70 0.44 - 1.00 mg/dL   Calcium 8.8 (L) 8.9 - 10.3 mg/dL   GFR calc non Af Amer >60 >60 mL/min   GFR calc Af Amer >60 >60 mL/min    Comment: (NOTE) The eGFR has been calculated using the CKD EPI equation. This calculation has not been validated in all clinical situations. eGFR's persistently <60 mL/min signify possible Chronic Kidney Disease.    Anion gap 8 5 -  15  Glucose, capillary     Status: Abnormal   Collection Time: 07/21/15  6:56 AM  Result Value Ref Range   Glucose-Capillary 117 (H) 65 - 99 mg/dL  Glucose, capillary     Status: Abnormal   Collection Time: 07/21/15 12:33 PM  Result Value Ref Range   Glucose-Capillary 125 (H) 65 - 99 mg/dL   Comment 1 Notify RN   Sedimentation rate     Status: Abnormal   Collection Time: 07/21/15 12:58 PM  Result Value Ref Range   Sed Rate 102 (H) 0 - 22 mm/hr  C-reactive protein     Status: Abnormal   Collection Time: 07/21/15 12:58 PM  Result Value Ref Range   CRP 7.7 (H) <1.0 mg/dL  Glucose, capillary     Status: Abnormal   Collection Time: 07/21/15  4:11 PM  Result Value Ref Range   Glucose-Capillary 126 (H) 65 -  99 mg/dL   Comment 1 Notify RN   Culture, blood (Routine X 2) w Reflex to ID Panel     Status: None (Preliminary result)   Collection Time: 07/21/15  6:19 PM  Result Value Ref Range   Specimen Description BLOOD LEFT ANTECUBITAL    Special Requests BOTTLES DRAWN AEROBIC ONLY [place]    Culture NO GROWTH < 24 HOURS    Report Status PENDING   Culture, blood (Routine X 2) w Reflex to ID Panel     Status: None (Preliminary result)   Collection Time: 07/21/15  6:28 PM  Result Value Ref Range   Specimen Description BLOOD LEFT FOREARM    Special Requests BOTTLES DRAWN AEROBIC AND ANAEROBIC 5CC    Culture NO GROWTH < 24 HOURS    Report Status PENDING   Glucose, capillary     Status: Abnormal   Collection Time: 07/21/15  8:48 PM  Result Value Ref Range   Glucose-Capillary 145 (H) 65 - 99 mg/dL  Sedimentation rate     Status: Abnormal   Collection Time: 07/22/15  4:25 AM  Result Value Ref Range   Sed Rate 101 (H) 0 - 22 mm/hr  C-reactive protein     Status: Abnormal   Collection Time: 07/22/15  4:25 AM  Result Value Ref Range   CRP 6.6 (H) <1.0 mg/dL  Glucose, capillary     Status: Abnormal   Collection Time: 07/22/15  6:53 AM  Result Value Ref Range   Glucose-Capillary 114 (H) 65 -  99 mg/dL  Glucose, capillary     Status: Abnormal   Collection Time: 07/22/15 11:55 AM  Result Value Ref Range   Glucose-Capillary 106 (H) 65 - 99 mg/dL   Comment 1 Notify RN   Glucose, capillary     Status: Abnormal   Collection Time: 07/22/15  4:55 PM  Result Value Ref Range   Glucose-Capillary 100 (H) 65 - 99 mg/dL   Comment 1 Notify RN   Glucose, capillary     Status: Abnormal   Collection Time: 07/22/15  8:58 PM  Result Value Ref Range   Glucose-Capillary 188 (H) 65 - 99 mg/dL  Glucose, capillary     Status: Abnormal   Collection Time: 07/23/15  6:25 AM  Result Value Ref Range   Glucose-Capillary 102 (H) 65 - 99 mg/dL     HEENT: normal Cardio: RRR and no murmur Resp: CTA B/L and unlabored GI: BS positive and NT, ND Extremity:  No Edema Skin:   Intact and Other no heel breakdown, no evidence of rash Neuro: Lethargic, Confused, Flat, Cranial Nerve Abnormalities Left CN central 7, Abnormal Sensory reduced pinch sensation in LUE and LLE, Abnormal Motor 0/5 LUE and LLE, Tone:  clonus in Left ankle, Dysarthric and Inattention, left field cut Musc/Skel:  Other no pain with LUE or LLE ROM, tenderness over the R trap. Left heel cord tight as well as finger flexors Gen NAD   Assessment/Plan: 1. Functional deficits secondary to Right MCA infarct with Left HP, Left neglect, Left hemisensory deficits which require 3+ hours per day of interdisciplinary therapy in a comprehensive inpatient rehab setting. Physiatrist is providing close team supervision and 24 hour management of active medical problems listed below. Physiatrist and rehab team continue to assess barriers to discharge/monitor patient progress toward functional and medical goals. FIM: Function - Bathing Bathing activity did not occur: N/A (night bath? and wanted to complete other activitiy today) Position:  (EOB and supine) Body parts bathed by patient:  Right upper leg, Left upper leg Body parts bathed by helper: Buttocks,  Right lower leg, Left lower leg Bathing not applicable: Right arm, Left arm, Chest, Abdomen, Front perineal area, Back (Pt declined UB bathing) Assist Level: 2 helpers  Function- Upper Body Dressing/Undressing Upper body dressing/undressing activity did not occur: N/A (already dressed for the day) What is the patient wearing?: Pull over shirt/dress Pull over shirt/dress - Perfomed by patient: Thread/unthread right sleeve Pull over shirt/dress - Perfomed by helper: Put head through opening, Thread/unthread left sleeve, Pull shirt over trunk Assist Level: Touching or steadying assistance(Pt > 75%) (modA) Function - Lower Body Dressing/Undressing Lower body dressing/undressing activity did not occur: N/A (already dressed for the day) What is the patient wearing?: Pants, Non-skid slipper socks Position: Bed Pants- Performed by patient: Thread/unthread right pants leg Pants- Performed by helper: Thread/unthread left pants leg, Pull pants up/down, Thread/unthread right pants leg Non-skid slipper socks- Performed by helper: Don/doff right sock, Don/doff left sock Socks - Performed by helper: Don/doff right sock, Don/doff left sock Shoes - Performed by patient: Don/doff right shoe Shoes - Performed by helper: Don/doff right shoe, Don/doff left shoe, Fasten right, Fasten left Assist for footwear: Dependant Assist for lower body dressing: 2 Helpers  Function - Toileting Toileting activity did not occur: No continent bowel/bladder event Assist level: Two helpers  Function - Air cabin crew transfer activity did not occur: Safety/medical concerns Assist level to toilet: 2 helpers Assist level from toilet: 2 helpers  Function - Chair/bed transfer Chair/bed transfer method: Lateral scoot Chair/bed transfer assist level: 2 helpers Chair/bed transfer assistive device: Sliding board, Armrests Mechanical lift: Maximove Chair/bed transfer details: Verbal cues for sequencing, Verbal cues for  technique, Manual facilitation for weight shifting, Manual facilitation for placement, Manual facilitation for weight bearing  Function - Locomotion: Wheelchair Will patient use wheelchair at discharge?: Yes Type: Manual (TIS) Max wheelchair distance: 150 Assist Level: Dependent (Pt equals 0%) Assist Level: Dependent (Pt equals 0%) Assist Level: Dependent (Pt equals 0%) Function - Locomotion: Ambulation Ambulation activity did not occur: Safety/medical concerns  Function - Comprehension Comprehension: Auditory Comprehension assist level: Understands basic 75 - 89% of the time/ requires cueing 10 - 24% of the time  Function - Expression Expression: Verbal Expression assist level: Expresses basic 75 - 89% of the time/requires cueing 10 - 24% of the time. Needs helper to occlude trach/needs to repeat words.  Function - Social Interaction Social Interaction assist level: Interacts appropriately 75 - 89% of the time - Needs redirection for appropriate language or to initiate interaction.  Function - Problem Solving Problem solving assist level: Solves basic 25 - 49% of the time - needs direction more than half the time to initiate, plan or complete simple activities  Function - Memory Memory assist level: Recognizes or recalls 25 - 49% of the time/requires cueing 50 - 75% of the time Patient normally able to recall (first 3 days only): Current season  Medical Problem List and Plan: 1.  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 No antithrombotic needed.Cont Abx thru 3/7, Team conference today please see physician documentation under team conference tab, met with team face-to-face to discuss problems,progress, and goals. Formulized individual treatment plan based on medical history, underlying problem and comorbidities. - 2.  DVT Prophylaxis/Anticoagulation: Pharmaceutical: Lovenox,LE venous dopplers neg 3. Pain Management:  Has chronic back pain per family. Will use tylenol prn for now  -topamax for postroke headache  -tramadol in place of  Percocet due to  itching  -overall improvement in sleep/headache 4. Mood: LCSW to follow for evaluation and support.   5. Neuropsych: This patient is not fully capable of making decisions on her own behalf. 6. Skin/Wound Care: routine pressure relief measures.   7. Fluids/Electrolytes/Nutrition: Monitor I/O.upgraded to D3 nectar, will cont Megace,Intake improved to 50-80%    Getting dehydrated with honey liquids.   IVF at nights and need to encourage fluid intake during the day. 8. 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 PET scan on outpatient basis. 10. Hypokalemia: K+ 3.3 2/26 will recheck in am increase KCL supplement,63mq  11. Cytotoxic edema: Weaned of hypertonic solution.    12. Endocarditis  Antibiotic-End date March 20th , now evidence that this has affected mitral valve causing regurg,elevated ESR she will cont ceftriaxone for her endocarditis for another week unless repeat cx show resistance  13. Anemia: Hgb low but has stayed in 7.6-8.0 range, check stool guaic was pos 1/23, neg on 2/2 and 2/16, Hgb stable at 8.5 2/26   14. Hypotension: Cont to monitor 15. Headache:, Tramadol [id number] prn,resolved 16.  Endocarditis with  17.  Mildly increased troponin of questionable clinical significance,  Cardiology repeated ECHO with severe MR now noted, not good surgical candidate, discussed with Dr VaLucianne Leirigt LOS (Days) 16 A FACE TO F[place]hoshoni 07/23/2015, 8:42 AM

## 2015-07-23 NOTE — Progress Notes (Signed)
Speech Language Pathology Daily Session Note  Patient Details  Name: Becky Patterson MRN: [id number] Date of Birth: December 02, 1955  Today's Date: 07/23/2015 SLP Individual Time: 1400-1430 SLP Individual Time Calculation (min): 30 min  Short Term Goals: Week 3: SLP Short Term Goal 1 (Week 3): Pt will attend to the left in >75% of observable opportunities with min assist verbal cues.   SLP Short Term Goal 2 (Week 3): Pt will sustain her attention to basic familiar tasks for 15 minute intervals with mod assist verbal cues for redirection.  SLP Short Term Goal 3 (Week 3): Pt will consume dys 3 textures and nectar thick liquids with supervision cues for use of swallowing precautions.   SLP Short Term Goal 4 (Week 3): Pt willl consume trials of water via cup sips as part of the water protocol with minimal overt s/s of aspiration for 3 consecutive sessions with supervision cues for use of swallowing precautions.  SLP Short Term Goal 5 (Week 3): Pt will identify at least 2 deficits occurring s/p CVA with mod question cues.    Skilled Therapeutic Interventions: Skilled treatment session focused on cognitive goals. SLP facilitated session by providing extra time and Max A verbal and visual cues for problem solving and scanning to her left visual field during a basic sorting task from a field of 4. Once sorted, patient was able to total the amount of money appropriately with Mod A verbal and visual cues. Patient left supine in bed with all needs within reach. Continue with current plan of care.    Function:    Cognition Comprehension Comprehension assist level: Understands basic 75 - 89% of the time/ requires cueing 10 - 24% of the time  Expression   Expression assist level: Expresses basic 75 - 89% of the time/requires cueing 10 - 24% of the time. Needs helper to occlude trach/needs to repeat words.  Social Interaction Social Interaction assist level: Interacts appropriately 75 - 89% of the time - Needs  redirection for appropriate language or to initiate interaction.  Problem Solving Problem solving assist level: Solves basic 25 - 49% of the time - needs direction more than half the time to initiate, plan or complete simple activities  Memory Memory assist level: Recognizes or recalls 25 - 49% of the time/requires cueing 50 - 75% of the time    Pain Pain Assessment Pain Assessment: No/denies pain  Therapy/Group: Individual Therapy  Megin Consalvo 07/23/2015, 3:48 PM

## 2015-07-23 NOTE — Progress Notes (Signed)
Speech Language Pathology Daily Session Note  Patient Details  Name: Becky Patterson MRN: [id number] Date of Birth: 08-03-1955  Today's Date: 07/23/2015 SLP Individual Time: 1132-1208 SLP Individual Time Calculation (min): 36 min  Short Term Goals: Week 3: SLP Short Term Goal 1 (Week 3): Pt will attend to the left in >75% of observable opportunities with min assist verbal cues.   SLP Short Term Goal 2 (Week 3): Pt will sustain her attention to basic familiar tasks for 15 minute intervals with mod assist verbal cues for redirection.  SLP Short Term Goal 3 (Week 3): Pt will consume dys 3 textures and nectar thick liquids with supervision cues for use of swallowing precautions.   SLP Short Term Goal 4 (Week 3): Pt willl consume trials of water via cup sips as part of the water protocol with minimal overt s/s of aspiration for 3 consecutive sessions with supervision cues for use of swallowing precautions.  SLP Short Term Goal 5 (Week 3): Pt will identify at least 2 deficits occurring s/p CVA with mod question cues.    Skilled Therapeutic Interventions:  Pt was seen for skilled ST targeting goals for dysphagia and ongoing family education.  [name] and daughter were present during today's therapy session and were provided with skilled education regarding parameters of the water protocol including waiting 30 minutes after any POs, completing thorough oral care prior to trials, and administration of small cup sips of water only.  Pt's family was provided with a handout to facilitate carryover of protocols in between therapy sessions.  All questions were answered to their satisfaction at this time.  Pt consumed regular, unthickened water following oral care per the water protocol with supervision cues for use of swallowing precautions without overt s/s of aspiration.  Pt left in bed with call bell within reach and family at bedside.  Continue per current plan of care.     Function:  Eating Eating                  Cognition Comprehension Comprehension assist level: Understands basic 75 - 89% of the time/ requires cueing 10 - 24% of the time  Expression   Expression assist level: Expresses basic 75 - 89% of the time/requires cueing 10 - 24% of the time. Needs helper to occlude trach/needs to repeat words.  Social Interaction Social Interaction assist level: Interacts appropriately 75 - 89% of the time - Needs redirection for appropriate language or to initiate interaction.  Problem Solving Problem solving assist level: Solves basic 25 - 49% of the time - needs direction more than half the time to initiate, plan or complete simple activities  Memory Memory assist level: Recognizes or recalls 25 - 49% of the time/requires cueing 50 - 75% of the time    Pain Pain Assessment Pain Assessment: No/denies pain  Therapy/Group: Individual Therapy  Loni Delbridge, Selinda Orion 07/23/2015, 12:30 PM

## 2015-07-23 NOTE — Progress Notes (Signed)
Occupational Therapy Session Note  Patient Details  Name: Becky Patterson MRN: [id number] Date of Birth: [birth year]  Today's Date: 07/23/2015 OT Individual Time: 7425-9563 OT Individual Time Calculation (min): 70 min    Short Term Goals: Week 1:  OT Short Term Goal 1 (Week 1): Pt will maintain static sitting balance EOB for 2 mins with close supervision in preparation for selfcare tasks.  OT Short Term Goal 1 - Progress (Week 1): Not met OT Short Term Goal 2 (Week 1): Pt will complete UB bathing and dressing sitting supported with min assist. OT Short Term Goal 2 - Progress (Week 1): Not met OT Short Term Goal 3 (Week 1): Pt will transfer squat pivot to the drop arm commode with max assist of 1 person.  OT Short Term Goal 3 - Progress (Week 1): Not met OT Short Term Goal 4 (Week 1): Pt will locate all grooming and bathing items placed left of midline with no more than mod instructional cueing. OT Short Term Goal 4 - Progress (Week 1): Not met OT Short Term Goal 5 (Week 1): Pt's spouse will return demonstrate safe assist with LUE PROM exercises following handout.   OT Short Term Goal 5 - Progress (Week 1): Met Week 2:  OT Short Term Goal 1 (Week 2): Pt will maintain static sitting balance EOB for 2 mins with close supervision in preparation for selfcare tasks.  OT Short Term Goal 1 - Progress (Week 2): Met OT Short Term Goal 2 (Week 2): Pt will complete UB bathing and dressing sitting supported with min assist. OT Short Term Goal 2 - Progress (Week 2): Met OT Short Term Goal 3 (Week 2): Pt will transfer squat pivot to the drop arm commode with max assist of 1 person.  OT Short Term Goal 3 - Progress (Week 2): Progressing toward goal OT Short Term Goal 4 (Week 2): Pt will locate all grooming and bathing items placed left of midline with no more than mod instructional cueing. OT Short Term Goal 4 - Progress (Week 2): Progressing toward goal OT Short Term Goal 5 (Week 2): Pt will use the LUE  during bathing sessions with max assist.   OT Short Term Goal 5 - Progress (Week 2): Not progressing Week 3:  OT Short Term Goal 1 (Week 3): Pt will transfer to drop arm BSC with max A x 1. OT Short Term Goal 2 (Week 3): Pt will don shirt with min A. OT Short Term Goal 3 (Week 3): Pt will bathe will mod A sitting on BSC.  Skilled Therapeutic Interventions/Progress Updates:    Pt seen for skilled OT to facilitate functional mobilty and sitting balance with ADL retraining. Because pt was able to maintain sitting balance for short periods of time yesterday, the [facility] was attempted today to determine if that could be a good option. Unfortunatly, due to her heavy leaning, decreased L side awareness , impulsivity, and overall decreased postural and midline awareness the BSC in not a safe option.  She is also very petite which makes positioning of her feet challenging. Pt was transferred to [facility] and sat there for a few minutes before transferring back to bed but with total A of 2 helpers.  Self care continued from bed level with pt EOB for UB self care and max A to maintain balance. Pt can sit statically without distraction but as soon as she is trying to accomplish at task she is unable to maintain balance. Pt adjusted back in  bed. ROM to LUE with tapping facilitation, no active movement observed. Pt in bed with all needs met and bed alarm set.  Therapy Documentation Precautions:  Precautions Precautions: Fall Precaution Comments: Lt neglect , poor trunk control, decreased awareness Restrictions Weight Bearing Restrictions: No     Pain: Pain Assessment Pain Assessment: No/denies pain   ADL:  See Function Navigator for Current Functional Status.   Therapy/Group: Individual Therapy  [place] 07/23/2015, 12:46 PM

## 2015-07-23 NOTE — Progress Notes (Signed)
Physical Therapy Session Note  Patient Details  Name: Becky Patterson MRN: [id number] Date of Birth: 12-04-1955  Today's Date: 07/23/2015 PT Individual Time: 0800-0900 PT Individual Time Calculation (min): 60 min   Short Term Goals: Week 2:  PT Short Term Goal 1 (Week 2): pt will maintain static sitting balance with min A PT Short Term Goal 2 (Week 2): pt will perform functional transfers with max A PT Short Term Goal 3 (Week 2): pt will attend to therapeutic task x 1 minute with min cuing  Skilled Therapeutic Interventions/Progress Updates:   Patient received in bed with husband present. Donned socks and pants with total A via rolling in bed with min A to L and max A to R using rails. Transferred supine > sit with max A using bed rail and verbal cues for technique. Performed sitting balance edge of bed with max multimodal cues in order to drink nectar thick coffee with RUE with supervision. Without coffee in hand, patient required max multimodal cues for positioning RUE on bed as she would repeatedly move hand around on bed resulting in LOB to L or posteriorly with total A to recover. Patient demonstrated sufficient righting reaction with lateral lean to L x 1 without assistance. Performed slide board transfer bed > wheelchair with husband assisting with wheelchair setup and stabilizing slide board with total A to complete transfer. Patient required +2A to reposition in TIS wheelchair due to pushing during transfer. Patient consumed breakfast meal in day room with focus on adhering to swallowing strategies and scanning to midline and left to locate items on tray. Patient left sitting in day room with husband to finish breakfast, NT notified of patient location.   Therapy Documentation Precautions:  Precautions Precautions: Fall Precaution Comments: Lt neglect , poor trunk control, decreased awareness Restrictions Weight Bearing Restrictions: No Pain: Pain Assessment Pain Assessment:  0-10 Pain Score: 2  Pain Type: Acute pain Pain Location: Shoulder Pain Orientation: Left Pain Descriptors / Indicators: Aching Pain Onset: On-going Pain Intervention(s): Repositioned   See Function Navigator for Current Functional Status.   Therapy/Group: Individual Therapy  Laretta Alstrom 07/23/2015, 9:50 AM

## 2015-07-24 ENCOUNTER — Inpatient Hospital Stay (HOSPITAL_COMMUNITY): Payer: 59 | Admitting: Speech Pathology

## 2015-07-24 ENCOUNTER — Inpatient Hospital Stay (HOSPITAL_COMMUNITY): Payer: 59 | Admitting: Occupational Therapy

## 2015-07-24 ENCOUNTER — Inpatient Hospital Stay (HOSPITAL_COMMUNITY): Payer: 59 | Admitting: *Deleted

## 2015-07-24 DIAGNOSIS — I2690 Septic pulmonary embolism without acute cor pulmonale: Secondary | ICD-10-CM

## 2015-07-24 DIAGNOSIS — R51 Headache: Secondary | ICD-10-CM

## 2015-07-24 LAB — GLUCOSE, CAPILLARY
GLUCOSE-CAPILLARY: 149 mg/dL — AB (ref 65–99)
GLUCOSE-CAPILLARY: 98 mg/dL (ref 65–99)
Glucose-Capillary: 106 mg/dL — ABNORMAL HIGH (ref 65–99)
Glucose-Capillary: 123 mg/dL — ABNORMAL HIGH (ref 65–99)

## 2015-07-24 MED ORDER — ALTEPLASE 2 MG IJ SOLR
2.0000 mg | Freq: Once | INTRAMUSCULAR | Status: AC
  Administered 2015-07-24: 2 mg
  Filled 2015-07-24 (×2): qty 2

## 2015-07-24 NOTE — Progress Notes (Signed)
Speech Language Pathology Daily Session Note  Patient Details  Name: Becky Patterson MRN: [id number] Date of Birth: [birth year]  Today's Date: 07/24/2015 SLP Individual Time: 1305-1400 SLP Individual Time Calculation (min): 55 min  Short Term Goals: Week 3: SLP Short Term Goal 1 (Week 3): Pt will attend to the left in >75% of observable opportunities with min assist verbal cues.   SLP Short Term Goal 2 (Week 3): Pt will sustain her attention to basic familiar tasks for 15 minute intervals with mod assist verbal cues for redirection.  SLP Short Term Goal 3 (Week 3): Pt will consume dys 3 textures and nectar thick liquids with supervision cues for use of swallowing precautions.   SLP Short Term Goal 4 (Week 3): Pt willl consume trials of water via cup sips as part of the water protocol with minimal overt s/s of aspiration for 3 consecutive sessions with supervision cues for use of swallowing precautions.  SLP Short Term Goal 5 (Week 3): Pt will identify at least 2 deficits occurring s/p CVA with mod question cues.    Skilled Therapeutic Interventions:  Pt was seen for skilled ST targeting goals for cognition and dysphagia.  SLP facilitated the session with trials of water following thorough oral care per the water protocol.  Pt consumed ~3 oz of water without overt s/s of aspiration with supervision cues for rate and portion control.  SLP facilitated the session with a novel card game targeting visual scanning to the left and sustained attention to task.  Pt required max assist verbal and visual cues to identify matches between cards from a field of 2 in a minimally distracting environment due to decreased alertness impacting her attention to task.   SLP initially attempted to place cards in left and right visual fields to facilitate scanning between the two; however, given pt's increased difficulty attending, pt was unable to successfully complete task; therefore, SLP simplified structure and placed  both cards in the right visual field.  Even with task simplification, pt was only able to complete the task for 25% accuracy with max assist multimodal cues for initiation, attention, and working memory.  Pt was left in bed with call bell within reach and family at bedside.  Continue per current plan of care.    Function:  Eating Eating   Modified Consistency Diet: Yes Eating Assist Level: Supervision or verbal cues           Cognition Comprehension Comprehension assist level: Understands basic 75 - 89% of the time/ requires cueing 10 - 24% of the time  Expression   Expression assist level: Expresses basic 75 - 89% of the time/requires cueing 10 - 24% of the time. Needs helper to occlude trach/needs to repeat words.  Social Interaction Social Interaction assist level: Interacts appropriately 75 - 89% of the time - Needs redirection for appropriate language or to initiate interaction.  Problem Solving Problem solving assist level: Solves basic 25 - 49% of the time - needs direction more than half the time to initiate, plan or complete simple activities  Memory Memory assist level: Recognizes or recalls 25 - 49% of the time/requires cueing 50 - 75% of the time    Pain Pain Assessment Pain Assessment: No/denies pain  Therapy/Group: Individual Therapy  Akshaj Besancon, Selinda Orion 07/24/2015, 4:25 PM

## 2015-07-24 NOTE — Progress Notes (Signed)
Physical Therapy Session Note  Patient Details  Name: Becky Patterson MRN: [id number] Date of Birth: 11/26/1955  Today's Date: 07/24/2015 PT Individual Time: 1006-1104 PT Individual Time Calculation (min): 58 min   Short Term Goals: Week 2:  PT Short Term Goal 1 (Week 2): pt will maintain static sitting balance with min A PT Short Term Goal 2 (Week 2): pt will perform functional transfers with max A PT Short Term Goal 3 (Week 2): pt will attend to therapeutic task x 1 minute with min cuing  Skilled Therapeutic Interventions/Progress Updates:   Tx focused on functional mobility training via sliding board, sitting balance, and NMR via forced use, manual facilitation, and multimodal cues throughout tx. Pt up in TIS WC, needing to use bed pan. Discussed BSC, but pt reoported back pain with BSC. Pt c/o L shoulder pain and educated on attempting to limit use during all mobility, leading with trunk instead.   NMR Manual facilitation and increased time during reciprocal scooting to edge of chair and max forward/R lean to PT's L hip for pushing eduction.  Sliding board transfer to pt's L WC<>bed with +2 assist for safety and equipment management. Pt able to assist via pushing with RUE to L over the course of 3 scoots each direction, contributing approximately 25%. Pt was best able to assist with increased time to complete each scoot with rocking for momentum and cues for lean forward, lift, and scoot.  Sit><supine with Max A via sidelying with multi-modal cues for sequence/technique, including fully tucked roll.   Rolling R/L with cues for LUE management and technique with chin tuck. Increased proprioception in R sidelying during pan placement and cleansing. Bridging x10 throughout with cues and manual facilitation.   Static and minimally dynamic sitting balance EOB with feet supported for midline orientation, L trunk/head rotation, sustained R lean to targets, and reaching in all directions with cues  for slowed, controlled movements. Pt needed S during static, but up to Max during dynamic tasks due to strong L lean.    Pt left up in TIS WC with lap belt and spouse present, all needs in reach.      Therapy Documentation Precautions:  Precautions Precautions: Fall Precaution Comments: Lt neglect , poor trunk control, decreased awareness Restrictions Weight Bearing Restrictions: No General:   Vital Signs: Therapy Vitals Temp: 98.9 F (37.2 C) Temp Source: Oral Pulse Rate: 96 Resp: 20 BP: 114/72 mmHg Patient Position (if appropriate): Lying Oxygen Therapy SpO2: 95 % O2 Device: Not Delivered   See Function Navigator for Current Functional Status.   Therapy/Group: Individual Therapy  Kennieth Rad, PT, DPT   07/24/2015, 8:59 AM

## 2015-07-24 NOTE — Progress Notes (Signed)
Social Work Patient ID: Becky Patterson, female   DOB: 02-21-1956, 60 y.o.   MRN: [id number] Met with pt, sister and husband to discuss team conference slow progress and if it is a realistic goal for home upon discharge from rehab. Sister is participating in pt's therapies but is realizing she is Too much care for them at this time. She has started touring NH and will let worker know which ones she wants FL2 sent too. All are on board with pursuing short term NHP. Will being process.

## 2015-07-24 NOTE — Progress Notes (Signed)
Occupational Therapy Session Note  Patient Details  Name: ESBEIDY MCLAINE MRN: [id number] Date of Birth: 1955/09/08  Today's Date: 07/24/2015 OT Individual Time: 1532-1600 OT Individual Time Calculation (min): 28 min    Short Term Goals: Week 3:  OT Short Term Goal 1 (Week 3): Pt will transfer to drop arm BSC with max A x 1. OT Short Term Goal 2 (Week 3): Pt will don shirt with min A. OT Short Term Goal 3 (Week 3): Pt will bathe will mod A sitting on BSC.  Skilled Therapeutic Interventions/Progress Updates:  Upon entering the room, pt supine in bed resting with caregiver present during part of session. Pt with no c/o pain this session. OT standing on L side of pt in order to encourage visual scanning to midline and L. OT providing demonstration for self ROM of L UE for finger flex/ext, supination/pronation, and elbow flex/ext with pt requiring mod verbal cues for task. Pt occasionally needing hand over hand assist for proper technique. OT also providing education regarding secondary stroke education and recommending participation in stroke support group. Caregiver and pt verbalized understanding.   Therapy Documentation Precautions:  Precautions Precautions: Fall Precaution Comments: Lt neglect , poor trunk control, decreased awareness Restrictions Weight Bearing Restrictions: No General:   Vital Signs: Therapy Vitals Temp: 98.4 F (36.9 C) Temp Source: Oral Pulse Rate: 95 Resp: 19 BP: (!) 138/92 mmHg Patient Position (if appropriate): Sitting Oxygen Therapy SpO2: 96 % O2 Device: Not Delivered  See Function Navigator for Current Functional Status.   Therapy/Group: Individual Therapy  Phineas Semen 07/24/2015, 4:12 PM

## 2015-07-24 NOTE — Progress Notes (Signed)
Occupational Therapy Session Note  Patient Details  Name: Becky Patterson MRN: [id number] Date of Birth: 05/23/1956  Today's Date: 07/24/2015 OT Individual Time: 0800-0900 OT Individual Time Calculation (min): 60 min    Short Term Goals: Week 3:  OT Short Term Goal 1 (Week 3): Pt will transfer to drop arm BSC with max A x 1. OT Short Term Goal 2 (Week 3): Pt will don shirt with min A. OT Short Term Goal 3 (Week 3): Pt will bathe will mod A sitting on BSC.  Skilled Therapeutic Interventions/Progress Updates:    1:1 [name] and sister present when arrived. They Discussed with SW d/c plans are now to pursue SNF.  Sister had already assisted pt with donning shirt and threading pants.  Focus began with positioning in bed and rolling with VC for sequencing and management of left UE. Pt requires min cuing with bed rails to roll towards her left and mod A with max multimodal cuing to roll towards pt's right.  Total A to pull up pants. Max A to come into sitting from sidelying. Min to max A for static sitting balance EOB.  Engaged in donning socks and shoes with support on pt's left side.  Functional task of LB dressing promoting sustained forward flexion and weight shift towards the right. Performed slide board transfer towards pt's right into w/c with therapist sitting on pt's left providing support while facilitating scoot pivots into chair.  Able to perform max A transfer with 2nd person holding w/c and slide board in place.  Transitioned into the gym.  Performed standing in standing frame; using mirror for visual feedback to find, achieve and sustain midline with max multimodal cuing. Focus on visual scanning and head ROM to locate objects and to promote ROm at neck to alleviate some of the tightness.  Trunk elongation with reaching towards right upper quadrant to reach for object; also promoting weight shift towards right.  Pt still with tendency to push but able to relax for brief periods of time.    Therapy Documentation Precautions:  Precautions Precautions: Fall Precaution Comments: Lt neglect , poor trunk control, decreased awareness Restrictions Weight Bearing Restrictions: No Pain:  pt with c/o neck and upper shoulder discomfort at the beginning of session but agreeable to get up.  PA, MD and RN aware.  Allowed for rest prn, stretching and RN offered meds at end of session.  Muscle rub applied to back of neck in session  See Function Navigator for Current Functional Status.   Therapy/Group: Individual Therapy  Willeen Cass [facility] 07/24/2015, 3:05 PM

## 2015-07-24 NOTE — Progress Notes (Signed)
Subjective/Complaints: Shoulders are sore no falls ROS- No SOB/CP No abd pain, - N/V, no blood in stool, dark stools  Objective: Vital Signs: Blood pressure 114/72, pulse 96, temperature 98.9 F (37.2 C), temperature source Oral, resp. rate 20, height '5\' 1"'  (1.549 m), weight 79.425 kg (175 lb 1.6 oz), SpO2 95 %. Dg Swallowing Func-speech Pathology  07/15/2015  Objective Swallowing Evaluation: Type of Study: MBS-Modified Barium Swallow Study Patient Details Name: Becky Patterson MRN: [id number] Date of Birth: [birth year] Today's Date: 07/15/2015 Time: SLP Start Time (ACUTE ONLY): 1340 SLP Stop Time (ACUTE ONLY): 1410 SLP Time Calculation (min) (ACUTE ONLY): 30 min Past Medical History: Past Medical History Diagnosis Date . Lung mass  . Bacterial endocarditis  . Splenic infarction  . Epistaxis  Past Surgical History: Past Surgical History Procedure Laterality Date . Back surgery   . Tee without cardioversion N/A 06/24/2015   Procedure: TRANSESOPHAGEAL ECHOCARDIOGRAM (TEE);  Surgeon: Larey Dresser, MD;  Location: [place];  Service: Cardiovascular;  Laterality: N/A; . Radiology with anesthesia N/A 06/29/2015   Procedure: RADIOLOGY WITH ANESTHESIA;  Surgeon: Luanne Bras, MD;  Location: [facility];  Service: Radiology;  Laterality: N/A; . Cholecystectomy   HPI: Becky Patterson is a 60 y.o. female history of infective endocarditis that was treated with discharge on 2/2, continuing on antibiotics at home, lung mass noted on that admission. Re-admitted with left sided weakness that progressed after admission. 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  Pt admitted to CIR on 07/07/15.  Tolerating Dys 3, honey thick liquids diet.  Repeat MBS ordered to objectively determine readiness to advance given improvements noted at bedside.  No Data Recorded Assessment / Plan / Recommendation CHL IP CLINICAL IMPRESSIONS 07/15/2015 Therapy Diagnosis Mild pharyngeal phase dysphagia;Mild oral phase  dysphagia Clinical Impression Pt presents with improved swallowing function and now presents with a mild oropharyngeal dysphagia with sensory>motor deficits.  Pt presents with left sided labial and lingual weakness which results in anterior loss of boluses, impaired mastication of solids, and prolonged posterior transit of boluses to the oropharynx.  Decreased pharyngeal sensation and base of tongue weakness resulted in poor bolus cohesion and premature spillage of materials into the pharynx with swallow response triggered at the vallecula with thickened liquids and solids, delayed to the pyriforms with thin liquids.  Delay in swallow initiation resulted in penetration of large boluses of nectar thick liquids and thin liquids.  Penetration of thin liquids was greater in amount and occurred deeper into the laryngeal vestibule, requiring increased cues for throat clear and extra swallows to clear.  Chin tuck ineffective largely due to cognitive deficits impacting pt's ability to successfully time strategy during the swallow.   Given pt's significant cognitive deficits, limited mobility, and persistent lethargy, recommend conservative advancement to Dys 3, nectar thick liquids with full supervision for use of swallowing precautions.  Pt would also benefit from implementation of the water protocol to continue working towards liquids advancement.  Prognosis for advancement good given progress made thus far with continued ST interventions for trials of advanced consistencies and use of compensatory swallowing strategies.   Impact on safety and function Mild aspiration risk     Prognosis 07/03/2015 Prognosis for Safe Diet Advancement Good Barriers to Reach Goals -- Barriers/Prognosis Comment -- CHL IP DIET RECOMMENDATION 07/15/2015 SLP Diet Recommendations Dysphagia 3 (Mech soft) solids;Nectar thick liquid Liquid Administration via Cup;No straw Medication Administration Crushed with puree Compensations Slow rate;Small  sips/bites;Lingual sweep for clearance of pocketing Postural Changes  Seated upright at 90 degrees            CHL IP ORAL PHASE 07/15/2015 Oral Phase Impaired Oral - Pudding Teaspoon -- Oral - Pudding Cup -- Oral - Honey Teaspoon -- Oral - Honey Cup Left anterior bolus loss;Delayed oral transit;Decreased bolus cohesion;Premature spillage;Lingual/palatal residue Oral - Nectar Teaspoon -- Oral - Nectar Cup Left anterior bolus loss;Delayed oral transit;Lingual/palatal residue;Decreased bolus cohesion;Premature spillage Oral - Nectar Straw Lingual/palatal residue;Decreased bolus cohesion;Premature spillage Oral - Thin Teaspoon -- Oral - Thin Cup Left anterior bolus loss;Lingual/palatal residue;Delayed oral transit;Decreased bolus cohesion;Premature spillage Oral - Thin Straw Lingual/palatal residue;Decreased bolus cohesion;Premature spillage Oral - Puree Lingual/palatal residue;Delayed oral transit;Premature spillage Oral - Mech Soft Lingual/palatal residue;Premature spillage;Delayed oral transit;Impaired mastication Oral - Regular -- Oral - Multi-Consistency -- Oral - Pill -- Oral Phase - Comment --  CHL IP PHARYNGEAL PHASE 07/15/2015 Pharyngeal Phase Impaired Pharyngeal- Pudding Teaspoon -- Pharyngeal -- Pharyngeal- Pudding Cup -- Pharyngeal -- Pharyngeal- Honey Teaspoon -- Pharyngeal -- Pharyngeal- Honey Cup Delayed swallow initiation-vallecula;Reduced tongue base retraction;Pharyngeal residue - valleculae Pharyngeal -- Pharyngeal- Nectar Teaspoon -- Pharyngeal -- Pharyngeal- Nectar Cup Delayed swallow initiation-vallecula;Reduced tongue base retraction;Pharyngeal residue - valleculae;Penetration/Aspiration during swallow Pharyngeal Material enters airway, remains ABOVE vocal cords then ejected out Pharyngeal- Nectar Straw -- Pharyngeal -- Pharyngeal- Thin Teaspoon -- Pharyngeal -- Pharyngeal- Thin Cup Delayed swallow initiation-pyriform sinuses;Reduced tongue base retraction;Penetration/Aspiration during  swallow;Pharyngeal residue - valleculae;Pharyngeal residue - pyriform Pharyngeal Material enters airway, remains ABOVE vocal cords and not ejected out Pharyngeal- Thin Straw Delayed swallow initiation-pyriform sinuses;Reduced tongue base retraction;Penetration/Aspiration during swallow;Pharyngeal residue - valleculae;Pharyngeal residue - pyriform Pharyngeal Material enters airway, remains ABOVE vocal cords and not ejected out Pharyngeal- Puree Delayed swallow initiation-vallecula;Reduced tongue base retraction Pharyngeal -- Pharyngeal- Mechanical Soft Delayed swallow initiation-vallecula;Reduced tongue base retraction Pharyngeal -- Pharyngeal- Regular -- Pharyngeal -- Pharyngeal- Multi-consistency -- Pharyngeal -- Pharyngeal- Pill -- Pharyngeal -- Pharyngeal Comment --  CHL IP CERVICAL ESOPHAGEAL PHASE 07/03/2015 Cervical Esophageal Phase WFL Pudding Teaspoon -- Pudding Cup -- Honey Teaspoon -- Honey Cup -- Nectar Teaspoon -- Nectar Cup -- Nectar Straw -- Thin Teaspoon -- Thin Cup -- Thin Straw -- Puree -- Mechanical Soft -- Regular -- Multi-consistency -- Pill -- Cervical Esophageal Comment -- No flowsheet data found. Page, Selinda Orion 07/15/2015, 4:17 PM              Results for orders placed or performed during the hospital encounter of 07/07/15 (from the past 72 hour(s))  Glucose, capillary     Status: Abnormal   Collection Time: 07/21/15 12:33 PM  Result Value Ref Range   Glucose-Capillary 125 (H) 65 - 99 mg/dL   Comment 1 Notify RN   Sedimentation rate     Status: Abnormal   Collection Time: 07/21/15 12:58 PM  Result Value Ref Range   Sed Rate 102 (H) 0 - 22 mm/hr  C-reactive protein     Status: Abnormal   Collection Time: 07/21/15 12:58 PM  Result Value Ref Range   CRP 7.7 (H) <1.0 mg/dL  Glucose, capillary     Status: Abnormal   Collection Time: 07/21/15  4:11 PM  Result Value Ref Range   Glucose-Capillary 126 (H) 65 - 99 mg/dL   Comment 1 Notify RN   Culture, blood (Routine X 2) w Reflex to  ID Panel     Status: None (Preliminary result)   Collection Time: 07/21/15  6:19 PM  Result Value Ref Range   Specimen Description BLOOD LEFT ANTECUBITAL  Special Requests BOTTLES DRAWN AEROBIC ONLY [place]    Culture NO GROWTH 2 DAYS    Report Status PENDING   Culture, blood (Routine X 2) w Reflex to ID Panel     Status: None (Preliminary result)   Collection Time: 07/21/15  6:28 PM  Result Value Ref Range   Specimen Description BLOOD LEFT FOREARM    Special Requests BOTTLES DRAWN AEROBIC AND ANAEROBIC 5CC    Culture NO GROWTH 2 DAYS    Report Status PENDING   Glucose, capillary     Status: Abnormal   Collection Time: 07/21/15  8:48 PM  Result Value Ref Range   Glucose-Capillary 145 (H) 65 - 99 mg/dL  Sedimentation rate     Status: Abnormal   Collection Time: 07/22/15  4:25 AM  Result Value Ref Range   Sed Rate 101 (H) 0 - 22 mm/hr  C-reactive protein     Status: Abnormal   Collection Time: 07/22/15  4:25 AM  Result Value Ref Range   CRP 6.6 (H) <1.0 mg/dL  Glucose, capillary     Status: Abnormal   Collection Time: 07/22/15  6:53 AM  Result Value Ref Range   Glucose-Capillary 114 (H) 65 - 99 mg/dL  Glucose, capillary     Status: Abnormal   Collection Time: 07/22/15 11:55 AM  Result Value Ref Range   Glucose-Capillary 106 (H) 65 - 99 mg/dL   Comment 1 Notify RN   Glucose, capillary     Status: Abnormal   Collection Time: 07/22/15  4:55 PM  Result Value Ref Range   Glucose-Capillary 100 (H) 65 - 99 mg/dL   Comment 1 Notify RN   Glucose, capillary     Status: Abnormal   Collection Time: 07/22/15  8:58 PM  Result Value Ref Range   Glucose-Capillary 188 (H) 65 - 99 mg/dL  Glucose, capillary     Status: Abnormal   Collection Time: 07/23/15  6:25 AM  Result Value Ref Range   Glucose-Capillary 102 (H) 65 - 99 mg/dL  Glucose, capillary     Status: Abnormal   Collection Time: 07/23/15 11:37 AM  Result Value Ref Range   Glucose-Capillary 174 (H) 65 - 99 mg/dL   Comment 1  Notify RN   Glucose, capillary     Status: Abnormal   Collection Time: 07/23/15  4:37 PM  Result Value Ref Range   Glucose-Capillary 110 (H) 65 - 99 mg/dL   Comment 1 Notify RN   Glucose, capillary     Status: Abnormal   Collection Time: 07/23/15  9:28 PM  Result Value Ref Range   Glucose-Capillary 104 (H) 65 - 99 mg/dL  Glucose, capillary     Status: Abnormal   Collection Time: 07/24/15  6:58 AM  Result Value Ref Range   Glucose-Capillary 106 (H) 65 - 99 mg/dL     HEENT: normal Cardio: RRR and no murmur Resp: CTA B/L and unlabored GI: BS positive and NT, ND Extremity:  No Edema Skin:   Intact and Other no heel breakdown, no evidence of rash Neuro: Lethargic, Confused, Flat, Cranial Nerve Abnormalities Left CN central 7, Abnormal Sensory reduced pinch sensation in LUE and LLE, Abnormal Motor 0/5 LUE and LLE, Tone:  clonus in Left ankle, Dysarthric and Inattention, left field cut Musc/Skel:  Other no pain with LUE or LLE ROM, tenderness over the R trap. Left heel cord tight as well as finger flexors Gen NAD   Assessment/Plan: 1. Functional deficits secondary to Right MCA infarct with  Left HP, Left neglect, Left hemisensory deficits which require 3+ hours per day of interdisciplinary therapy in a comprehensive inpatient rehab setting. Physiatrist is providing close team supervision and 24 hour management of active medical problems listed below. Physiatrist and rehab team continue to assess barriers to discharge/monitor patient progress toward functional and medical goals. FIM: Function - Bathing Bathing activity did not occur: N/A (night bath? and wanted to complete other activitiy today) Position: Bed Body parts bathed by patient: Left arm, Chest, Abdomen, Front perineal area, Right upper leg Body parts bathed by helper: Right arm, Buttocks, Left upper leg, Right lower leg, Left lower leg, Back Bathing not applicable: Right arm, Left arm, Chest, Abdomen, Front perineal area, Back  (Pt declined UB bathing) Assist Level: 2 helpers  Function- Upper Body Dressing/Undressing Upper body dressing/undressing activity did not occur: N/A (already dressed for the day) What is the patient wearing?: Pull over shirt/dress Pull over shirt/dress - Perfomed by patient: Thread/unthread right sleeve Pull over shirt/dress - Perfomed by helper: Put head through opening, Thread/unthread left sleeve, Pull shirt over trunk Assist Level: Touching or steadying assistance(Pt > 75%) (modA) Function - Lower Body Dressing/Undressing Lower body dressing/undressing activity did not occur: N/A (already dressed for the day) What is the patient wearing?: Pants, Non-skid slipper socks Position: Bed Pants- Performed by patient: Thread/unthread right pants leg Pants- Performed by helper: Thread/unthread left pants leg, Pull pants up/down, Thread/unthread right pants leg Non-skid slipper socks- Performed by helper: Don/doff right sock, Don/doff left sock Socks - Performed by helper: Don/doff right sock, Don/doff left sock Shoes - Performed by patient: Don/doff right shoe Shoes - Performed by helper: Don/doff right shoe, Don/doff left shoe, Fasten right, Fasten left Assist for footwear: Dependant Assist for lower body dressing: 2 Helpers  Function - Toileting Toileting activity did not occur: No continent bowel/bladder event Assist level: Two helpers  Function - Air cabin crew transfer activity did not occur: Safety/medical concerns Assist level to toilet: 2 helpers Assist level from toilet: 2 helpers  Function - Chair/bed transfer Chair/bed transfer method: Lateral scoot Chair/bed transfer assist level: 2 helpers Chair/bed transfer assistive device: Sliding board, Armrests Mechanical lift: Maximove Chair/bed transfer details: Verbal cues for sequencing, Verbal cues for technique, Manual facilitation for weight shifting, Manual facilitation for placement, Manual facilitation for weight  bearing  Function - Locomotion: Wheelchair Will patient use wheelchair at discharge?: Yes Type: Manual (TIS) Max wheelchair distance: 150 Assist Level: Dependent (Pt equals 0%) Assist Level: Dependent (Pt equals 0%) Assist Level: Dependent (Pt equals 0%) Function - Locomotion: Ambulation Ambulation activity did not occur: Safety/medical concerns  Function - Comprehension Comprehension: Auditory Comprehension assist level: Understands basic 75 - 89% of the time/ requires cueing 10 - 24% of the time  Function - Expression Expression: Verbal Expression assist level: Expresses basic 75 - 89% of the time/requires cueing 10 - 24% of the time. Needs helper to occlude trach/needs to repeat words.  Function - Social Interaction Social Interaction assist level: Interacts appropriately 75 - 89% of the time - Needs redirection for appropriate language or to initiate interaction.  Function - Problem Solving Problem solving assist level: Solves basic 25 - 49% of the time - needs direction more than half the time to initiate, plan or complete simple activities  Function - Memory Memory assist level: Recognizes or recalls 25 - 49% of the time/requires cueing 50 - 75% of the time Patient normally able to recall (first 3 days only): Current season  Medical Problem List and  Plan: 1.  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 No antithrombotic needed.Cont Abx thru 3/20,  2.  DVT Prophylaxis/Anticoagulation: Pharmaceutical: Lovenox,LE venous dopplers neg 3. Pain Management: Has chronic back pain per family. Will use tylenol prn for now  -topamax for postroke headache  -tramadol  -overall improvement in sleep/headache 4. Mood: LCSW to follow for evaluation and support.   5. Neuropsych: This patient is not fully capable of making decisions on her own behalf. 6. Skin/Wound Care: routine pressure relief measures.   7.  Fluids/Electrolytes/Nutrition: Monitor I/O.upgraded to D3 nectar, will cont Megace,Intake improved to 50-80%    Getting dehydrated with honey liquids.   IVF at nights and need to encourage fluid intake during the day. 8. 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 PET scan on outpatient basis. 10. Hypokalemia: K+ 3.3 2/26 will recheck in am increase KCL supplement,38mq , will repeat BMET in am 11. Cytotoxic edema: Weaned of hypertonic solution.    12. Endocarditis  Antibiotic-End date March 20th , now evidence that this has affected mitral valve causing regurg,elevated ESR she will cont ceftriaxone for her endocarditis for another week unless repeat cx show resistance  13. Anemia: Hgb low but has stayed in 7.6-8.0 range, check stool guaic was pos 1/23, neg on 2/2 and 2/16, Hgb stable at 8.5 2/26, will repeat in am   14. Hypotension: Cont to monitor 15. Headache:, Tramadol [id number] prn,resolved 16.  Endocarditis with neg repeat BCx2 , await final 17.  Mildly increased troponin of questionable clinical significance,  Cardiology repeated ECHO with severe MR now noted, not good surgical candidate, discussed with Dr VaLucianne Leirigt LOS (Days) 1740 [place]ACE TO [place] 07/24/2015, 8:20 AM

## 2015-07-25 ENCOUNTER — Inpatient Hospital Stay (HOSPITAL_COMMUNITY): Payer: 59 | Admitting: Physical Therapy

## 2015-07-25 ENCOUNTER — Inpatient Hospital Stay (HOSPITAL_COMMUNITY): Payer: 59 | Admitting: Occupational Therapy

## 2015-07-25 ENCOUNTER — Inpatient Hospital Stay (HOSPITAL_COMMUNITY): Payer: 59 | Admitting: Speech Pathology

## 2015-07-25 LAB — CBC
HEMATOCRIT: 29.6 % — AB (ref 36.0–46.0)
HEMOGLOBIN: 8.9 g/dL — AB (ref 12.0–15.0)
MCH: 28.2 pg (ref 26.0–34.0)
MCHC: 30.1 g/dL (ref 30.0–36.0)
MCV: 93.7 fL (ref 78.0–100.0)
Platelets: 381 10*3/uL (ref 150–400)
RBC: 3.16 MIL/uL — AB (ref 3.87–5.11)
RDW: 15.6 % — ABNORMAL HIGH (ref 11.5–15.5)
WBC: 9.3 10*3/uL (ref 4.0–10.5)

## 2015-07-25 LAB — BASIC METABOLIC PANEL
ANION GAP: 12 (ref 5–15)
BUN: 11 mg/dL (ref 6–20)
CHLORIDE: 99 mmol/L — AB (ref 101–111)
CO2: 25 mmol/L (ref 22–32)
Calcium: 9.7 mg/dL (ref 8.9–10.3)
Creatinine, Ser: 0.79 mg/dL (ref 0.44–1.00)
GFR calc Af Amer: >60 mL/min (ref >60)
GLUCOSE: 129 mg/dL — AB (ref 65–99)
POTASSIUM: 3.9 mmol/L (ref 3.5–5.1)
Sodium: 136 mmol/L (ref 135–145)

## 2015-07-25 LAB — GLUCOSE, CAPILLARY
GLUCOSE-CAPILLARY: 116 mg/dL — AB (ref 65–99)
GLUCOSE-CAPILLARY: 139 mg/dL — AB (ref 65–99)
Glucose-Capillary: 144 mg/dL — ABNORMAL HIGH (ref 65–99)
Glucose-Capillary: 110 mg/dL — ABNORMAL HIGH (ref 65–99)

## 2015-07-25 MED ORDER — CHLORHEXIDINE GLUCONATE 0.12 % MT SOLN
OROMUCOSAL | Status: AC
  Administered 2015-07-25: 15 mL
  Filled 2015-07-25: qty 15

## 2015-07-25 MED ORDER — MIRTAZAPINE 15 MG PO TABS
15.0000 mg | ORAL_TABLET | Freq: Every day | ORAL | Status: DC
  Administered 2015-07-25 – 2015-07-30 (×6): 15 mg via ORAL
  Filled 2015-07-25 (×7): qty 1

## 2015-07-25 MED ORDER — TEMAZEPAM 7.5 MG PO CAPS
7.5000 mg | ORAL_CAPSULE | Freq: Every evening | ORAL | Status: DC | PRN
  Administered 2015-07-25: 7.5 mg via ORAL
  Filled 2015-07-25: qty 1

## 2015-07-25 NOTE — Plan of Care (Signed)
Problem: RH Balance Goal: LTG: Patient will maintain dynamic sitting balance (OT) LTG: Patient will maintain dynamic sitting balance with assistance during activities of daily living (OT)  LTG downgraded to mod A due to slow progress. Goal: LTG Patient will maintain dynamic standing with ADLs (OT) LTG: Patient will maintain dynamic standing balance with assist during activities of daily living (OT)  LTG downgraded to max A due to slow progress.  Problem: RH Dressing Goal: LTG Patient will perform lower body dressing w/assist (OT) LTG: Patient will perform lower body dressing with assist, with/without cues in positioning using equipment (OT)  LTG downgraded due to slow progress.  Problem: RH Toileting Goal: LTG Patient will perform toileting w/assist, cues/equip (OT) LTG: Patient will perform toiletiing (clothes management/hygiene) with assist, with/without cues using equipment (OT)  LTG discontinued as pt is not able to sit safely on a BSC at this time.  Problem: RH Toilet Transfers Goal: LTG Patient will perform toilet transfers w/assist (OT) LTG: Patient will perform toilet transfers with assist, with/without cues using equipment (OT)  LTG discontinued as pt is not safe to sit on a toilet or BSC at this time due to impaired sitting balance and poor trunk control.    Problem: RH Tub/Shower Transfers Goal: LTG Patient will perform tub/shower transfers w/assist (OT) LTG: Patient will perform tub/shower transfers with assist, with/without cues using equipment (OT)  LTG discontinued at pt is not safe to transfer into a shower or sit on a shower seat at this time due to impaired sitting balance.

## 2015-07-25 NOTE — Progress Notes (Addendum)
Occupational Therapy Session Note  Patient Details  Name: Becky Patterson MRN: [id number] Date of Birth: 1955/10/10  Today's Date: 07/25/2015 OT Individual Time: 1100-1200 OT Individual Time Calculation (min): 60 min    Short Term Goals: Week 1:  OT Short Term Goal 1 (Week 1): Pt will maintain static sitting balance EOB for 2 mins with close supervision in preparation for selfcare tasks.  OT Short Term Goal 1 - Progress (Week 1): Not met OT Short Term Goal 2 (Week 1): Pt will complete UB bathing and dressing sitting supported with min assist. OT Short Term Goal 2 - Progress (Week 1): Not met OT Short Term Goal 3 (Week 1): Pt will transfer squat pivot to the drop arm commode with max assist of 1 person.  OT Short Term Goal 3 - Progress (Week 1): Not met OT Short Term Goal 4 (Week 1): Pt will locate all grooming and bathing items placed left of midline with no more than mod instructional cueing. OT Short Term Goal 4 - Progress (Week 1): Not met OT Short Term Goal 5 (Week 1): Pt's spouse will return demonstrate safe assist with LUE PROM exercises following handout.   OT Short Term Goal 5 - Progress (Week 1): Met Week 2:  OT Short Term Goal 1 (Week 2): Pt will maintain static sitting balance EOB for 2 mins with close supervision in preparation for selfcare tasks.  OT Short Term Goal 1 - Progress (Week 2): Met OT Short Term Goal 2 (Week 2): Pt will complete UB bathing and dressing sitting supported with min assist. OT Short Term Goal 2 - Progress (Week 2): Met OT Short Term Goal 3 (Week 2): Pt will transfer squat pivot to the drop arm commode with max assist of 1 person.  OT Short Term Goal 3 - Progress (Week 2): Progressing toward goal OT Short Term Goal 4 (Week 2): Pt will locate all grooming and bathing items placed left of midline with no more than mod instructional cueing. OT Short Term Goal 4 - Progress (Week 2): Progressing toward goal OT Short Term Goal 5 (Week 2): Pt will use the LUE  during bathing sessions with max assist.   OT Short Term Goal 5 - Progress (Week 2): Not progressing Week 3:  OT Short Term Goal 1 (Week 3): Pt will transfer to drop arm BSC with max A x 1. OT Short Term Goal 2 (Week 3): Pt will don shirt with min A. OT Short Term Goal 3 (Week 3): Pt will bathe will mod A sitting on BSC.  Skilled Therapeutic Interventions/Progress Updates:    Pt seen for skilled OT to facilitate functional mobility and postural control to increase independence with ADLs. Pt received in bed stating she had LB bathed earlier with clean pants on. Pt attached to PICC line, UB self care delayed until end of session when her PICC line discontinued.  Pt continues to need mod A UB bathing, max A with shirt.  Pt transferred to w/c with slide board and then to therapy mat with max A +2 helpers. Bobath method utilized over L side as pt leans severely to the L.  On EOB and mat, pt can sit statically for several minutes with R hand supporting her on bed or mat.  Pt falls to L easily once R arm lifted. Pt worked on dynamic reaching and wt shifting to R to [employer] and then reaching across midline to L to place them on table with mod-max to support  balance and facilitate trunk rotation without collapsing into L.  NMF with tapping over L arm with PROM table slides, but no active movement observed. Transferred back to w/c and adjusted with quick release belt. Pt in room with all needs met.  LTGs modified: Problem: RH Balance Goal: LTG: Patient will maintain dynamic sitting balance (OT) LTG: Patient will maintain dynamic sitting balance with assistance during activities of daily living (OT)  LTG downgraded to mod A due to slow progress. Goal: LTG Patient will maintain dynamic standing with ADLs (OT) LTG: Patient will maintain dynamic standing balance with assist during activities of daily living (OT)  LTG downgraded to max A due to slow progress.  Problem: RH Dressing Goal: LTG Patient  will perform lower body dressing w/assist (OT) LTG: Patient will perform lower body dressing with assist, with/without cues in positioning using equipment (OT)  LTG downgraded due to slow progress.  Problem: RH Toileting Goal: LTG Patient will perform toileting w/assist, cues/equip (OT) LTG: Patient will perform toiletiing (clothes management/hygiene) with assist, with/without cues using equipment (OT)  LTG discontinued as pt is not able to sit safely on a BSC at this time.  Problem: RH Toilet Transfers Goal: LTG Patient will perform toilet transfers w/assist (OT) LTG: Patient will perform toilet transfers with assist, with/without cues using equipment (OT)  LTG discontinued as pt is not safe to sit on a toilet or BSC at this time due to impaired sitting balance and poor trunk control.   Problem: RH Tub/Shower Transfers Goal: LTG Patient will perform tub/shower transfers w/assist (OT) LTG: Patient will perform tub/shower transfers with assist, with/without cues using equipment (OT)  LTG discontinued at pt is not safe to transfer into a shower or sit on a shower seat at this time due to impaired sitting balance.   Therapy Documentation Precautions:  Precautions Precautions: Fall Precaution Comments: Lt neglect , poor trunk control, decreased awareness Restrictions Weight Bearing Restrictions: No  Pain: Pain Assessment Pain Assessment: No/denies pain ADL:   See Function Navigator for Current Functional Status.   Therapy/Group: Individual Therapy  [place] 07/25/2015, 12:36 PM

## 2015-07-25 NOTE — Progress Notes (Signed)
Subjective/Complaints: Shoulders are sore no falls ROS- No SOB/CP No abd pain, - N/V, no blood in stool, dark stools  Objective: Vital Signs: Blood pressure 150/89, pulse 97, temperature 98.4 F (36.9 C), temperature source Oral, resp. rate 22, height '5\' 1"'  (1.549 m), weight 78.155 kg (172 lb 4.8 oz), SpO2 94 %. Dg Swallowing Func-speech Pathology  07/15/2015  Objective Swallowing Evaluation: Type of Study: MBS-Modified Barium Swallow Study Patient Details Name: Becky Patterson MRN: [id number] Date of Birth: 16-Apr-1956 Today's Date: 07/15/2015 Time: SLP Start Time (ACUTE ONLY): 1340 SLP Stop Time (ACUTE ONLY): 1410 SLP Time Calculation (min) (ACUTE ONLY): 30 min Past Medical History: Past Medical History Diagnosis Date . Lung mass  . Bacterial endocarditis  . Splenic infarction  . Epistaxis  Past Surgical History: Past Surgical History Procedure Laterality Date . Back surgery   . Tee without cardioversion N/A 06/24/2015   Procedure: TRANSESOPHAGEAL ECHOCARDIOGRAM (TEE);  Surgeon: Larey Dresser, MD;  Location: [facility];  Service: Cardiovascular;  Laterality: N/A; . Radiology with anesthesia N/A 06/29/2015   Procedure: RADIOLOGY WITH ANESTHESIA;  Surgeon: Luanne Bras, MD;  Location: [facility];  Service: Radiology;  Laterality: N/A; . Cholecystectomy   HPI: Becky Patterson is a 60 y.o. female history of infective endocarditis that was treated with discharge on 2/2, continuing on antibiotics at home, lung mass noted on that admission. Re-admitted with left sided weakness that progressed after admission. 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  Pt admitted to CIR on 07/07/15.  Tolerating Dys 3, honey thick liquids diet.  Repeat MBS ordered to objectively determine readiness to advance given improvements noted at bedside.  No Data Recorded Assessment / Plan / Recommendation CHL IP CLINICAL IMPRESSIONS 07/15/2015 Therapy Diagnosis Mild pharyngeal phase dysphagia;Mild oral phase  dysphagia Clinical Impression Pt presents with improved swallowing function and now presents with a mild oropharyngeal dysphagia with sensory>motor deficits.  Pt presents with left sided labial and lingual weakness which results in anterior loss of boluses, impaired mastication of solids, and prolonged posterior transit of boluses to the oropharynx.  Decreased pharyngeal sensation and base of tongue weakness resulted in poor bolus cohesion and premature spillage of materials into the pharynx with swallow response triggered at the vallecula with thickened liquids and solids, delayed to the pyriforms with thin liquids.  Delay in swallow initiation resulted in penetration of large boluses of nectar thick liquids and thin liquids.  Penetration of thin liquids was greater in amount and occurred deeper into the laryngeal vestibule, requiring increased cues for throat clear and extra swallows to clear.  Chin tuck ineffective largely due to cognitive deficits impacting pt's ability to successfully time strategy during the swallow.   Given pt's significant cognitive deficits, limited mobility, and persistent lethargy, recommend conservative advancement to Dys 3, nectar thick liquids with full supervision for use of swallowing precautions.  Pt would also benefit from implementation of the water protocol to continue working towards liquids advancement.  Prognosis for advancement good given progress made thus far with continued ST interventions for trials of advanced consistencies and use of compensatory swallowing strategies.   Impact on safety and function Mild aspiration risk     Prognosis 07/03/2015 Prognosis for Safe Diet Advancement Good Barriers to Reach Goals -- Barriers/Prognosis Comment -- CHL IP DIET RECOMMENDATION 07/15/2015 SLP Diet Recommendations Dysphagia 3 (Mech soft) solids;Nectar thick liquid Liquid Administration via Cup;No straw Medication Administration Crushed with puree Compensations Slow rate;Small  sips/bites;Lingual sweep for clearance of pocketing Postural Changes  Seated upright at 90 degrees            CHL IP ORAL PHASE 07/15/2015 Oral Phase Impaired Oral - Pudding Teaspoon -- Oral - Pudding Cup -- Oral - Honey Teaspoon -- Oral - Honey Cup Left anterior bolus loss;Delayed oral transit;Decreased bolus cohesion;Premature spillage;Lingual/palatal residue Oral - Nectar Teaspoon -- Oral - Nectar Cup Left anterior bolus loss;Delayed oral transit;Lingual/palatal residue;Decreased bolus cohesion;Premature spillage Oral - Nectar Straw Lingual/palatal residue;Decreased bolus cohesion;Premature spillage Oral - Thin Teaspoon -- Oral - Thin Cup Left anterior bolus loss;Lingual/palatal residue;Delayed oral transit;Decreased bolus cohesion;Premature spillage Oral - Thin Straw Lingual/palatal residue;Decreased bolus cohesion;Premature spillage Oral - Puree Lingual/palatal residue;Delayed oral transit;Premature spillage Oral - Mech Soft Lingual/palatal residue;Premature spillage;Delayed oral transit;Impaired mastication Oral - Regular -- Oral - Multi-Consistency -- Oral - Pill -- Oral Phase - Comment --  CHL IP PHARYNGEAL PHASE 07/15/2015 Pharyngeal Phase Impaired Pharyngeal- Pudding Teaspoon -- Pharyngeal -- Pharyngeal- Pudding Cup -- Pharyngeal -- Pharyngeal- Honey Teaspoon -- Pharyngeal -- Pharyngeal- Honey Cup Delayed swallow initiation-vallecula;Reduced tongue base retraction;Pharyngeal residue - valleculae Pharyngeal -- Pharyngeal- Nectar Teaspoon -- Pharyngeal -- Pharyngeal- Nectar Cup Delayed swallow initiation-vallecula;Reduced tongue base retraction;Pharyngeal residue - valleculae;Penetration/Aspiration during swallow Pharyngeal Material enters airway, remains ABOVE vocal cords then ejected out Pharyngeal- Nectar Straw -- Pharyngeal -- Pharyngeal- Thin Teaspoon -- Pharyngeal -- Pharyngeal- Thin Cup Delayed swallow initiation-pyriform sinuses;Reduced tongue base retraction;Penetration/Aspiration during  swallow;Pharyngeal residue - valleculae;Pharyngeal residue - pyriform Pharyngeal Material enters airway, remains ABOVE vocal cords and not ejected out Pharyngeal- Thin Straw Delayed swallow initiation-pyriform sinuses;Reduced tongue base retraction;Penetration/Aspiration during swallow;Pharyngeal residue - valleculae;Pharyngeal residue - pyriform Pharyngeal Material enters airway, remains ABOVE vocal cords and not ejected out Pharyngeal- Puree Delayed swallow initiation-vallecula;Reduced tongue base retraction Pharyngeal -- Pharyngeal- Mechanical Soft Delayed swallow initiation-vallecula;Reduced tongue base retraction Pharyngeal -- Pharyngeal- Regular -- Pharyngeal -- Pharyngeal- Multi-consistency -- Pharyngeal -- Pharyngeal- Pill -- Pharyngeal -- Pharyngeal Comment --  CHL IP CERVICAL ESOPHAGEAL PHASE 07/03/2015 Cervical Esophageal Phase WFL Pudding Teaspoon -- Pudding Cup -- Honey Teaspoon -- Honey Cup -- Nectar Teaspoon -- Nectar Cup -- Nectar Straw -- Thin Teaspoon -- Thin Cup -- Thin Straw -- Puree -- Mechanical Soft -- Regular -- Multi-consistency -- Pill -- Cervical Esophageal Comment -- No flowsheet data found. Page, Selinda Orion 07/15/2015, 4:17 PM              Results for orders placed or performed during the hospital encounter of 07/07/15 (from the past 72 hour(s))  Glucose, capillary     Status: Abnormal   Collection Time: 07/22/15 11:55 AM  Result Value Ref Range   Glucose-Capillary 106 (H) 65 - 99 mg/dL   Comment 1 Notify RN   Glucose, capillary     Status: Abnormal   Collection Time: 07/22/15  4:55 PM  Result Value Ref Range   Glucose-Capillary 100 (H) 65 - 99 mg/dL   Comment 1 Notify RN   Glucose, capillary     Status: Abnormal   Collection Time: 07/22/15  8:58 PM  Result Value Ref Range   Glucose-Capillary 188 (H) 65 - 99 mg/dL  Glucose, capillary     Status: Abnormal   Collection Time: 07/23/15  6:25 AM  Result Value Ref Range   Glucose-Capillary 102 (H) 65 - 99 mg/dL  Glucose,  capillary     Status: Abnormal   Collection Time: 07/23/15 11:37 AM  Result Value Ref Range   Glucose-Capillary 174 (H) 65 - 99 mg/dL   Comment 1 Notify RN  Glucose, capillary     Status: Abnormal   Collection Time: 07/23/15  4:37 PM  Result Value Ref Range   Glucose-Capillary 110 (H) 65 - 99 mg/dL   Comment 1 Notify RN   Glucose, capillary     Status: Abnormal   Collection Time: 07/23/15  9:28 PM  Result Value Ref Range   Glucose-Capillary 104 (H) 65 - 99 mg/dL  Glucose, capillary     Status: Abnormal   Collection Time: 07/24/15  6:58 AM  Result Value Ref Range   Glucose-Capillary 106 (H) 65 - 99 mg/dL  Glucose, capillary     Status: Abnormal   Collection Time: 07/24/15 11:30 AM  Result Value Ref Range   Glucose-Capillary 149 (H) 65 - 99 mg/dL   Comment 1 Notify RN   Glucose, capillary     Status: None   Collection Time: 07/24/15  4:42 PM  Result Value Ref Range   Glucose-Capillary 98 65 - 99 mg/dL   Comment 1 Notify RN   Glucose, capillary     Status: Abnormal   Collection Time: 07/24/15  8:38 PM  Result Value Ref Range   Glucose-Capillary 123 (H) 65 - 99 mg/dL  CBC     Status: Abnormal   Collection Time: 07/25/15  5:20 AM  Result Value Ref Range   WBC 9.3 4.0 - 10.5 K/uL   RBC 3.16 (L) 3.87 - 5.11 MIL/uL   Hemoglobin 8.9 (L) 12.0 - 15.0 g/dL   HCT 29.6 (L) 36.0 - 46.0 %   MCV 93.7 78.0 - 100.0 fL   MCH 28.2 26.0 - 34.0 pg   MCHC 30.1 30.0 - 36.0 g/dL   RDW 15.6 (H) 11.5 - 15.5 %   Platelets 381 150 - 400 K/uL  Basic metabolic panel     Status: Abnormal   Collection Time: 07/25/15  5:20 AM  Result Value Ref Range   Sodium 136 135 - 145 mmol/L   Potassium 3.9 3.5 - 5.1 mmol/L   Chloride 99 (L) 101 - 111 mmol/L   CO2 25 22 - 32 mmol/L   Glucose, Bld 129 (H) 65 - 99 mg/dL   BUN 11 6 - 20 mg/dL   Creatinine, Ser 0.79 0.44 - 1.00 mg/dL   Calcium 9.7 8.9 - 10.3 mg/dL   GFR calc non Af Amer >60 >60 mL/min   GFR calc Af Amer >60 >60 mL/min    Comment: (NOTE) The  eGFR has been calculated using the CKD EPI equation. This calculation has not been validated in all clinical situations. eGFR's persistently <60 mL/min signify possible Chronic Kidney Disease.    Anion gap 12 5 - 15  Glucose, capillary     Status: Abnormal   Collection Time: 07/25/15  6:45 AM  Result Value Ref Range   Glucose-Capillary 144 (H) 65 - 99 mg/dL     HEENT: normal Cardio: RRR and no murmur Resp: CTA B/L and unlabored GI: BS positive and NT, ND Extremity:  No Edema Skin:   Intact and Other no heel breakdown, no evidence of rash Neuro: Lethargic, Confused, Flat, Cranial Nerve Abnormalities Left CN central 7, Abnormal Sensory reduced pinch sensation in LUE and LLE, Abnormal Motor 0/5 LUE and LLE, Tone:  clonus in Left ankle, Dysarthric and Inattention, left field cut Musc/Skel:  Other no pain with LUE or LLE ROM, tenderness over the R trap. Left heel cord tight as well as finger flexors Gen NAD   Assessment/Plan: 1. Functional deficits secondary to Right MCA infarct  with Left HP, Left neglect, Left hemisensory deficits which require 3+ hours per day of interdisciplinary therapy in a comprehensive inpatient rehab setting. Physiatrist is providing close team supervision and 24 hour management of active medical problems listed below. Physiatrist and rehab team continue to assess barriers to discharge/monitor patient progress toward functional and medical goals. FIM: Function - Bathing Bathing activity did not occur: N/A (night bath? and wanted to complete other activitiy today) Position: Bed Body parts bathed by patient: Left arm, Chest, Abdomen, Front perineal area, Right upper leg Body parts bathed by helper: Right arm, Buttocks, Left upper leg, Right lower leg, Left lower leg, Back Bathing not applicable: Right arm, Left arm, Chest, Abdomen, Front perineal area, Back (Pt declined UB bathing) Assist Level: 2 helpers  Function- Upper Body Dressing/Undressing Upper body  dressing/undressing activity did not occur: N/A (already dressed for the day) What is the patient wearing?: Pull over shirt/dress Pull over shirt/dress - Perfomed by patient: Thread/unthread right sleeve Pull over shirt/dress - Perfomed by helper: Put head through opening, Thread/unthread left sleeve, Pull shirt over trunk Assist Level: Touching or steadying assistance(Pt > 75%) (modA) Function - Lower Body Dressing/Undressing Lower body dressing/undressing activity did not occur: N/A (already dressed for the day) What is the patient wearing?: Pants, Non-skid slipper socks Position: Bed Pants- Performed by patient: Thread/unthread right pants leg Pants- Performed by helper: Thread/unthread left pants leg, Pull pants up/down, Thread/unthread right pants leg Non-skid slipper socks- Performed by helper: Don/doff right sock, Don/doff left sock Socks - Performed by helper: Don/doff right sock, Don/doff left sock Shoes - Performed by patient: Don/doff right shoe Shoes - Performed by helper: Don/doff right shoe, Don/doff left shoe, Fasten right, Fasten left Assist for footwear: Dependant Assist for lower body dressing: 2 Helpers  Function - Toileting Toileting activity did not occur: No continent bowel/bladder event Assist level: Two helpers  Function - Air cabin crew transfer activity did not occur: Safety/medical concerns Assist level to toilet: 2 helpers Assist level from toilet: 2 helpers  Function - Chair/bed transfer Chair/bed transfer method: Lateral scoot Chair/bed transfer assist level: 2 helpers Chair/bed transfer assistive device: Sliding board, Armrests Mechanical lift: Maximove Chair/bed transfer details: Verbal cues for sequencing, Verbal cues for technique, Manual facilitation for weight shifting, Manual facilitation for placement, Manual facilitation for weight bearing  Function - Locomotion: Wheelchair Will patient use wheelchair at discharge?: Yes Type: Manual  (TIS) Max wheelchair distance: 150 Assist Level: Dependent (Pt equals 0%) Assist Level: Dependent (Pt equals 0%) Assist Level: Dependent (Pt equals 0%) Function - Locomotion: Ambulation Ambulation activity did not occur: Safety/medical concerns  Function - Comprehension Comprehension: Auditory Comprehension assist level: Understands basic 75 - 89% of the time/ requires cueing 10 - 24% of the time  Function - Expression Expression: Verbal Expression assist level: Expresses basic 75 - 89% of the time/requires cueing 10 - 24% of the time. Needs helper to occlude trach/needs to repeat words.  Function - Social Interaction Social Interaction assist level: Interacts appropriately 75 - 89% of the time - Needs redirection for appropriate language or to initiate interaction.  Function - Problem Solving Problem solving assist level: Solves basic 25 - 49% of the time - needs direction more than half the time to initiate, plan or complete simple activities  Function - Memory Memory assist level: Recognizes or recalls 25 - 49% of the time/requires cueing 50 - 75% of the time Patient normally able to recall (first 3 days only): Current season  Medical Problem List  and Plan: 1.  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 No antithrombotic needed.Cont Abx thru 3/20,  2.  DVT Prophylaxis/Anticoagulation: Pharmaceutical: Lovenox,LE venous dopplers neg 3. Pain Management: Has chronic back pain per family. Will use tylenol prn for now    -tramadol-occ HA at noc  - 4. Mood: LCSW to follow for evaluation and support.   5. Neuropsych: This patient is not fully capable of making decisions on her own behalf. 6. Skin/Wound Care: routine pressure relief measures.   7. Fluids/Electrolytes/Nutrition: Monitor I/O.upgraded to D3 nectar, will cont Megace,Intake improved to 50-80%    Getting dehydrated with honey liquids.   IVF at nights and need to  encourage fluid intake during the day. 8. 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 PET scan on outpatient basis. 10. Hypokalemia: K+ 3.3 2/26 will recheck in am increase KCL supplement,104mq , will repeat BMET 3/3 normal 11. Cytotoxic edema: Weaned of hypertonic solution.    12. Endocarditis  Antibiotic-End date March 20th , now evidence that this has affected mitral valve causing regurg,elevated ESR she will cont ceftriaxone for her endocarditis for another week unless repeat cx show resistance  13. Anemia: Hgb low but has stayed in 7.6-8.0 range, check stool guaic was pos 1/23, neg on 2/2 and 2/16, Hgb stable at 8.5 2/26, repeat Hgb 8.9 on 3/3   14. Hypotension: Cont to monitor 15. Headache:, Tramadol [id number] prn,improved 16.  Endocarditis with neg repeat BCx2 , await final 17.  Severe mitral regurg,  Cardiology repeated ECHO with severe MR now noted, not good surgical candidate, discussed with Dr VaPrescott Gum8.  Insomnia increase Remeron LOS (Days) 18 A FACE TO FACE EVALUATION WAS PERFORMED  Darril Patriarca E 07/25/2015, 8:25 AM

## 2015-07-25 NOTE — Progress Notes (Signed)
Physical Therapy Session Note  Patient Details  Name: Becky Patterson MRN: [id number] Date of Birth: [birth year]  Today's Date: 07/25/2015 PT Individual Time: 1330-1400 PT Individual Time Calculation (min): 30 min   Short Term Goals: Week 1:  PT Short Term Goal 1 (Week 1): pt will maintain static sitting balance with min A PT Short Term Goal 1 - Progress (Week 1): Not met PT Short Term Goal 2 (Week 1): Pt will perform functional transfers with max A PT Short Term Goal 2 - Progress (Week 1): Not met PT Short Term Goal 3 (Week 1): pt will attend to therapeutic task x 1 minute with min cuing PT Short Term Goal 3 - Progress (Week 1): Not met  Skilled Therapeutic Interventions/Progress Updates:    Pt received sitting in w/c & agreeable to PT with motivation from therapist.  Pt reported she wanted to get back to bed. PT provided assistance with set up of w/c & total A for placement of sliding board. Pt required Mod A + 2 (PT & rehab tech) to transfer w/c>bed with cuing to lean forward & scoot bottom to L across board; pt required 4 pushes to complete transfer.  Pt then required Mod A to transfer to supine in bed. PT observed pt had a BM & PT assisted pt with rolling in bed multiple times (Max A to roll R, CGA to roll L) with use of bed rails for cleaning & donning new brief.  At the end of the session pt was left in bed with all needs met & call bell within reach.  Therapy Documentation Precautions:  Precautions Precautions: Fall Precaution Comments: Lt neglect , poor trunk control, decreased awareness Restrictions Weight Bearing Restrictions: No  Pain: Pain Assessment Pain Assessment: No/denies pain   See Function Navigator for Current Functional Status.   Therapy/Group: Individual Therapy  Waunita Schooner 07/25/2015, 3:40 PM

## 2015-07-25 NOTE — Plan of Care (Signed)
Problem: RH Ambulation Goal: LTG Patient will ambulate in controlled environment (PT) LTG: Patient will ambulate in a controlled environment, # of feet with assistance (PT).  Outcome: Not Applicable Date Met:  85/50/15 D/C due to pt now SNF placement 07/25/15

## 2015-07-25 NOTE — Progress Notes (Addendum)
Physical Therapy Weekly Progress Note  Patient Details  Name: Becky Patterson MRN: [id number] Date of Birth: [birth year]  Beginning of progress report period: July 15, 2015 End of progress report period: July 25, 2015 (late entry for 07/22/15)  Today's Date: 07/25/2015 PT Individual Time: 0905-1000 PT Individual Time Calculation (min): 55 min   Patient continues to make slow progress towards goals and has met 1 of 3 short term goals.  Pt continues to require inconsistent levels assistance for all mobility and can range from mod-max A for bed mobility, max-total A for transfers bed <> chair, she continues to require use of tilt in space w/c for positioning, postural control and safety, and continues to require total A and use of lift equipment for standing; she is currently unable to ambulate.  Due to pt's continued need for heavy physical assistance pt's family feel they will be unable to provide the 24/7 supervision and physical assistance pt will require at D/C.  Pt D/C disposition has changed to SNF placement for more prolonged rehabilitation to reach higher level of functional mobility independence and to further decrease burden of care prior to D/C home with family.  Patient continues to demonstrate the following deficits: impaired cognition, L neglect, impaired head and postural control, impaired motor planning, grading of movement, pusher's syndrome, visual impairments, impaired sitting and standing balance and therefore will continue to benefit from skilled PT intervention to enhance overall performance with activity tolerance, balance, postural control, ability to compensate for deficits, functional use of  left upper extremity and left lower extremity, attention and awareness.  Patient not progressing toward long term goals.  See goal revision..  Plan of care revisions: Goals downgraded to mod A overall for transfers, min A w/c mobility, total A for standing with therapy only and gait, home  w/c and car transfers goals D/C due to pt now SNF placement.  PT Short Term Goals Week 2:  PT Short Term Goal 1 (Week 2): pt will maintain static sitting balance with min A PT Short Term Goal 1 - Progress (Week 2): Not met PT Short Term Goal 2 (Week 2): pt will perform functional transfers with max A PT Short Term Goal 2 - Progress (Week 2): Not met PT Short Term Goal 3 (Week 2): pt will attend to therapeutic task x 1 minute with min cuing PT Short Term Goal 3 - Progress (Week 2): Met Week 3:  PT Short Term Goal 1 (Week 3): = LTG which have been downgraded overall due to lack of progress   Skilled Therapeutic Interventions/Progress Updates:   Pt received in tilt in space w/c with sister in law present.  Sister in law states she has not participated in hands on training but is not sure if she will be assisting at D/C.  Pt agreeable to trying standing frame again today.  Assisted pt with donning pants with pt verbalizing sequence with mod instructional cues and performed sit > squat from w/c with RUE support on sink, sliding that UE forwards to facilitate weight shift forwards and to the R with max-total A to maintain trunk flexion and weight shifting to R and second person pulling pants up over hips.  In gym pt set up in standing frame and performed sit > stand with max-total A with verbal cues for R foot and UE placement.  While in standing frame pt performed sustained attention sorting task of arranging months of the year in order and reaching directly to the R out of  BOS to facilitate R lateral and forward weight shift with max A.  Pt able to sustain attention for 1 minute at a time but required mod-max cues to visually scan fully to L to read month.  Pt began to report need to have a BM.  Pt transported back to the room in w/c and performed slideboard transfer to bed with max-total A and second person maintaining placement of board and w/c with therapist providing over the back assistance to maintain  forward trunk lean and weight shift forwards and R.  Pt performed sit > supine with mod-max A and performed bridging, scooting and rolling in bed with mod A for placement of bed pan.  Pt left with supervision of NT.  Therapy Documentation Precautions:  Precautions Precautions: Fall Precaution Comments: Lt neglect , poor trunk control, decreased awareness Restrictions Weight Bearing Restrictions: No Pain: Pain Assessment Pain Assessment: 0-10 Pain Score: 3  Pain Type: Acute pain Pain Location: Head Pain Descriptors / Indicators: Aching Pain Intervention(s): Medication (See eMAR)   See Function Navigator for Current Functional Status.  Therapy/Group: Individual Therapy  Raylene Everts [facility] 07/25/2015, 10:23 AM

## 2015-07-25 NOTE — NC FL2 (Signed)
[place] MEDICAID FL2 LEVEL OF CARE SCREENING TOOL     IDENTIFICATION  Patient Name: Becky Patterson Birthdate: 1955/09/26 Sex: female Admission Date (Current Location): 07/07/2015  [facility] and [place] Number:  [employer] and Address:  [facility] [place], [place] [address] [place], [place] [address]      Provider Number: [id number]  Attending Physician Name and Address:  Charlett Blake, MD  Relative Name and Phone Number:  Pilar Plate [phone]    Current Level of Care: Other (Comment) (Rehab Unit) Recommended Level of Care: Nursing Facility Prior Approval Number:    Date Approved/Denied:   PASRR Number: [id number]  Discharge Plan: SNF    Current Diagnoses: Patient Active Problem List   Diagnosis Date Noted  . Right-sided headache   . Septic embolism ([place])   . Severe mitral regurgitation   . Cerebrovascular accident (CVA) due to embolism of right middle cerebral artery ([place])   . Acute right MCA stroke ([place]) 07/07/2015  . Intracranial septic embolism ([place])   . Hemiplegia affecting left nondominant side ([place])   . Dysphagia   . Alteration of sensation as late effect of stroke   . Dehydration   . Bacterial endocarditis   . Maculopapular rash   . Acute blood loss anemia   . Other vascular headache   . Other complicated headache syndrome   . 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 Left hemiparesis ([place]) 07/03/2015  . 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 Visual field cut 07/03/2015  . Palliative care encounter   . DNR (do not resuscitate) discussion   . Acute respiratory failure with hypoxia ([place]) 06/30/2015  . Arterial hypotension 06/30/2015  . Acute encephalopathy   . Stroke due to embolism ([place])   . Stroke (cerebrum) ([place]) 06/29/2015  . Colitis   . Pyelonephritis   . Enterococcal bacteremia 06/19/2015  . Sepsis secondary to UTI ([place])   . Mass of lower lobe of left lung   . Tobacco abuse   . Anemia of chronic  disease   . Hyponatremia   . Hyperglycemia   . Hypokalemia   . Lung mass 06/15/2015    Orientation RESPIRATION BLADDER Height & Weight     Self, Place  Normal Incontinent Weight: 172 lb 4.8 oz (78.155 kg) Height:  '5\' 1"'$  (154.9 cm)  BEHAVIORAL SYMPTOMS/MOOD NEUROLOGICAL BOWEL NUTRITION STATUS      Continent Diet (DYs 3 nectar thick liquids-water protocol)  AMBULATORY STATUS COMMUNICATION OF NEEDS Skin   Total Care Verbally Normal                       Personal Care Assistance Level of Assistance  Bathing, Dressing, Feeding, Total care Bathing Assistance: Maximum assistance Feeding assistance: Limited assistance Dressing Assistance: Maximum assistance Total Care Assistance: Maximum assistance   Functional Limitations Info  Sight, Speech Sight Info: Impaired   Speech Info: Impaired    SPECIAL CARE FACTORS FREQUENCY  PT (By licensed PT), OT (By licensed OT), Speech therapy     PT Frequency: 5x week OT Frequency: 5x week     Speech Therapy Frequency: 5x week      Contractures Contractures Info: Not present    Additional Factors Info  Code Status (DNR) Code Status Info: DNR             Current Medications (07/25/2015):  This is the current hospital active medication list Current Facility-Administered Medications  Medication Dose Route Frequency Provider Last Rate Last Dose  .  acetaminophen (TYLENOL) tablet 650 mg  650 mg Oral Q6H PRN Bary Leriche, PA-C   650 mg at 07/23/15 4627  . alum & mag hydroxide-simeth (MAALOX/MYLANTA) 200-200-20 MG/5ML suspension 30 mL  30 mL Oral Q4H PRN Bary Leriche, PA-C      . antiseptic oral rinse solution (CORINZ)  7 mL Mouth Rinse BID Ivan Anchors Love, PA-C   7 mL at 07/25/15 0800  . bisacodyl (DULCOLAX) suppository 10 mg  10 mg Rectal Daily PRN Bary Leriche, PA-C      . cefTRIAXone (ROCEPHIN) 2 g in dextrose 5 % 50 mL IVPB  2 g Intravenous Q12H Ivan Anchors Love, PA-C   2 g at 07/25/15 0819  . chlorhexidine gluconate (PERIDEX) 0.12  % solution 15 mL  15 mL Mouth Rinse BID Ivan Anchors Love, PA-C   15 mL at 07/25/15 0800  . enoxaparin (LOVENOX) injection 40 mg  40 mg Subcutaneous Q24H Pamela S Love, PA-C   40 mg at 07/24/15 1717  . feeding supplement (PRO-STAT SUGAR FREE 64) liquid 30 mL  30 mL Oral BID Charlett Blake, MD   30 mL at 07/25/15 0819  . guaiFENesin-dextromethorphan (ROBITUSSIN DM) 100-10 MG/5ML syrup 5-10 mL  5-10 mL Oral Q6H PRN Bary Leriche, PA-C      . hydrocortisone cream 1 %   Topical TID Lavon Paganini Angiulli, PA-C      . hydrOXYzine (ATARAX/VISTARIL) tablet 10 mg  10 mg Oral Q6H PRN Carlyle Basques, MD   10 mg at 07/07/15 2105  . insulin aspart (novoLOG) injection 0-15 Units  0-15 Units Subcutaneous TID WC Bary Leriche, PA-C   2 Units at 07/25/15 0350  . iron polysaccharides (NIFEREX) capsule 150 mg  150 mg Oral q12n4p Ivan Anchors Love, PA-C   150 mg at 07/25/15 1216  . methocarbamol (ROBAXIN) tablet 500 mg  500 mg Oral Q6H PRN Bary Leriche, PA-C   500 mg at 07/24/15 0938  . methylphenidate (RITALIN) tablet 5 mg  5 mg Oral Q breakfast Bary Leriche, PA-C   5 mg at 07/25/15 1829  . mirtazapine (REMERON) tablet 15 mg  15 mg Oral QHS Charlett Blake, MD      . multivitamin with minerals tablet 1 tablet  1 tablet Per Tube Daily Bary Leriche, PA-C   1 tablet at 07/25/15 0820  . MUSCLE RUB CREA   Topical BID PRN Charlett Blake, MD      . nicotine (NICODERM CQ - dosed in mg/24 hours) patch 21 mg  21 mg Transdermal Daily Bary Leriche, PA-C   21 mg at 07/25/15 0820  . potassium chloride SA (K-DUR,KLOR-CON) CR tablet 20 mEq  20 mEq Oral Daily Charlett Blake, MD   20 mEq at 07/25/15 9371  . prochlorperazine (COMPAZINE) tablet 5-10 mg  5-10 mg Oral Q6H PRN Bary Leriche, PA-C       Or  . prochlorperazine (COMPAZINE) injection 5-10 mg  5-10 mg Intramuscular Q6H PRN Bary Leriche, PA-C       Or  . prochlorperazine (COMPAZINE) suppository 12.5 mg  12.5 mg Rectal Q6H PRN Bary Leriche, PA-C      . RESOURCE  THICKENUP CLEAR   Oral PRN Bary Leriche, PA-C      . sodium chloride flush (NS) 0.9 % injection 10-40 mL  10-40 mL Intracatheter PRN Bary Leriche, PA-C   30 mL at 07/24/15 2114  . sodium phosphate (FLEET) 7-19  GM/118ML enema 1 enema  1 enema Rectal Once PRN Bary Leriche, PA-C      . temazepam (RESTORIL) capsule 7.5 mg  7.5 mg Oral QHS PRN Bary Leriche, PA-C   7.5 mg at 07/25/15 0017  . traMADol (ULTRAM) tablet 100 mg  100 mg Oral Q6H PRN Charlett Blake, MD   100 mg at 07/25/15 9432     Discharge Medications: Please see discharge summary for a list of discharge medications.  Relevant Imaging Results:  Relevant Lab Results:   Additional Information Needs more rehab too low level at thist time for fmaily to take care of her at home. Committed family-husband, sister and daughter  [name], [place]

## 2015-07-25 NOTE — Progress Notes (Signed)
Speech Language Pathology Daily Session Note  Patient Details  Name: Becky Patterson MRN: [id number] Date of Birth: May 10, 1956  Today's Date: 07/25/2015 SLP Individual Time: 0800-0900 SLP Individual Time Calculation (min): 60 min  Short Term Goals: Week 3: SLP Short Term Goal 1 (Week 3): Pt will attend to the left in >75% of observable opportunities with min assist verbal cues.   SLP Short Term Goal 2 (Week 3): Pt will sustain her attention to basic familiar tasks for 15 minute intervals with mod assist verbal cues for redirection.  SLP Short Term Goal 3 (Week 3): Pt will consume dys 3 textures and nectar thick liquids with supervision cues for use of swallowing precautions.   SLP Short Term Goal 4 (Week 3): Pt willl consume trials of water via cup sips as part of the water protocol with minimal overt s/s of aspiration for 3 consecutive sessions with supervision cues for use of swallowing precautions.  SLP Short Term Goal 5 (Week 3): Pt will identify at least 2 deficits occurring s/p CVA with mod question cues.    Skilled Therapeutic Interventions: Pt seen for cognitive and dysphagia goals in the setting of transfer and eating breakfast meal and brushing teeth. Pt was able to recall swallow precautions independently. Pt demo'd tendency throughout the session to say, "I don't know" as her initial response, however with encouragement, pt was able to recall the majority of the requested information. Mod verbal cueing and increased time to locate and retrieve items in her environment. Focus on making eye contact with conversational partners and expressing appreciation for staff and family efforts. Pt able to demonstrate compliance with swallow precautions with mod verbal cues only. Pt and family education provided to sister in law, Vaughan Basta for supervision of PO. Vaughan Basta was cleared, but expressed some wariness given pt's disposition.    Function:  Eating Eating   Modified Consistency Diet: Yes Eating  Assist Level: Supervision or verbal cues;Set up assist for   Eating Set Up Assist For: Opening containers       Cognition Comprehension Comprehension assist level: Understands basic 75 - 89% of the time/ requires cueing 10 - 24% of the time  Expression   Expression assist level: Expresses basic 75 - 89% of the time/requires cueing 10 - 24% of the time. Needs helper to occlude trach/needs to repeat words.  Social Interaction Social Interaction assist level: Interacts appropriately 75 - 89% of the time - Needs redirection for appropriate language or to initiate interaction.  Problem Solving Problem solving assist level: Solves basic 25 - 49% of the time - needs direction more than half the time to initiate, plan or complete simple activities  Memory Memory assist level: Recognizes or recalls 50 - 74% of the time/requires cueing 25 - 49% of the time    Pain Pain Assessment Pain Assessment: No/denies pain  Therapy/Group: Individual Therapy  Vinetta Bergamo 07/25/2015, 12:06 PM

## 2015-07-25 NOTE — Plan of Care (Signed)
Problem: RH Car Transfers Goal: LTG Patient will perform car transfers with assist (PT) LTG: Patient will perform car transfers with assistance (PT).  Outcome: Not Applicable Date Met:  40/98/11 D/C due to pt now SNF placement 07/25/15  Problem: RH Wheelchair Mobility Goal: LTG Patient will propel w/c in home environment (PT) LTG: Patient will propel wheelchair in home environment, # of feet with assistance (PT).  Outcome: Not Applicable Date Met:  91/47/82 D/C due to pt now SNF placement 07/25/15

## 2015-07-25 NOTE — Plan of Care (Signed)
Problem: RH Balance Goal: LTG Patient will maintain dynamic sitting balance (PT) LTG: Patient will maintain dynamic sitting balance with assistance during mobility activities (PT)  Downgraded due to lack of progress 07/25/15 Goal: LTG Patient will maintain dynamic standing balance (PT) LTG: Patient will maintain dynamic standing balance with assistance during mobility activities (PT)  Downgraded 07/25/15 due to lack of progress  Problem: RH Bed Mobility Goal: LTG Patient will perform bed mobility with assist (PT) LTG: Patient will perform bed mobility with assistance, with/without cues (PT).  Downgraded 07/25/15 due to lack of progress  Problem: RH Bed to Chair Transfers Goal: LTG Patient will perform bed/chair transfers w/assist (PT) LTG: Patient will perform bed/chair transfers with assistance, with/without cues (PT).  Downgraded 07/25/15 due to lack of progress  Problem: RH Ambulation Goal: LTG Patient will ambulate in controlled environment (PT) LTG: Patient will ambulate in a controlled environment, # of feet with assistance (PT).  D/C due to lack of progress 07/25/15  Problem: RH Wheelchair Mobility Goal: LTG Patient will propel w/c in controlled environment (PT) LTG: Patient will propel wheelchair in controlled environment, # of feet with assist (PT)  Downgraded 07/25/15 due to lack of progress

## 2015-07-26 ENCOUNTER — Inpatient Hospital Stay (HOSPITAL_COMMUNITY): Payer: 59

## 2015-07-26 LAB — CULTURE, BLOOD (ROUTINE X 2)
CULTURE: NO GROWTH
Culture: NO GROWTH

## 2015-07-26 LAB — GLUCOSE, CAPILLARY
GLUCOSE-CAPILLARY: 113 mg/dL — AB (ref 65–99)
GLUCOSE-CAPILLARY: 128 mg/dL — AB (ref 65–99)
GLUCOSE-CAPILLARY: 132 mg/dL — AB (ref 65–99)
Glucose-Capillary: 144 mg/dL — ABNORMAL HIGH (ref 65–99)

## 2015-07-26 NOTE — Progress Notes (Signed)
Subjective/Complaints: Slept better last noc, didn't get remeron ROS- No SOB/CP No abd pain, - N/V, no blood in stool, dark stools  Objective: Vital Signs: Blood pressure 116/56, pulse 90, temperature 98.7 F (37.1 C), temperature source Oral, resp. rate 18, height _0  (1.549 m), weight 80.2 kg (176 lb 12.9 oz), SpO2 93 %. Dg Swallowing Func-speech Pathology  07/15/2015  Objective Swallowing Evaluation: Type of Study: MBS-Modified Barium Swallow Study Patient Details Name: Becky Patterson MRN: [id number] Date of Birth: [birth year] Today's Date: 07/15/2015 Time: SLP Start Time (ACUTE ONLY): 1340 SLP Stop Time (ACUTE ONLY): 1410 SLP Time Calculation (min) (ACUTE ONLY): 30 min Past Medical History: Past Medical History Diagnosis Date . Lung mass  . Bacterial endocarditis  . Splenic infarction  . Epistaxis  Past Surgical History: Past Surgical History Procedure Laterality Date . Back surgery   . Tee without cardioversion N/A 06/24/2015   Procedure: TRANSESOPHAGEAL ECHOCARDIOGRAM (TEE);  Surgeon: Larey Dresser, MD;  Location: [facility];  Service: Cardiovascular;  Laterality: N/A; . Radiology with anesthesia N/A 06/29/2015   Procedure: RADIOLOGY WITH ANESTHESIA;  Surgeon: Luanne Bras, MD;  Location: [facility];  Service: Radiology;  Laterality: N/A; . Cholecystectomy   HPI: Becky Patterson is a 60 y.o. female history of infective endocarditis that was treated with discharge on 2/2, continuing on antibiotics at home, lung mass noted on that admission. Re-admitted with left sided weakness that progressed after admission. 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  Pt admitted to CIR on 07/07/15.  Tolerating Dys 3, honey thick liquids diet.  Repeat MBS ordered to objectively determine readiness to advance given improvements noted at bedside.  No Data Recorded Assessment / Plan / Recommendation CHL IP CLINICAL IMPRESSIONS 07/15/2015 Therapy Diagnosis Mild pharyngeal phase dysphagia;Mild  oral phase dysphagia Clinical Impression Pt presents with improved swallowing function and now presents with a mild oropharyngeal dysphagia with sensory>motor deficits.  Pt presents with left sided labial and lingual weakness which results in anterior loss of boluses, impaired mastication of solids, and prolonged posterior transit of boluses to the oropharynx.  Decreased pharyngeal sensation and base of tongue weakness resulted in poor bolus cohesion and premature spillage of materials into the pharynx with swallow response triggered at the vallecula with thickened liquids and solids, delayed to the pyriforms with thin liquids.  Delay in swallow initiation resulted in penetration of large boluses of nectar thick liquids and thin liquids.  Penetration of thin liquids was greater in amount and occurred deeper into the laryngeal vestibule, requiring increased cues for throat clear and extra swallows to clear.  Chin tuck ineffective largely due to cognitive deficits impacting pt's ability to successfully time strategy during the swallow.   Given pt's significant cognitive deficits, limited mobility, and persistent lethargy, recommend conservative advancement to Dys 3, nectar thick liquids with full supervision for use of swallowing precautions.  Pt would also benefit from implementation of the water protocol to continue working towards liquids advancement.  Prognosis for advancement good given progress made thus far with continued ST interventions for trials of advanced consistencies and use of compensatory swallowing strategies.   Impact on safety and function Mild aspiration risk     Prognosis 07/03/2015 Prognosis for Safe Diet Advancement Good Barriers to Reach Goals -- Barriers/Prognosis Comment -- CHL IP DIET RECOMMENDATION 07/15/2015 SLP Diet Recommendations Dysphagia 3 (Mech soft) solids;Nectar thick liquid Liquid Administration via Cup;No straw Medication Administration Crushed with puree Compensations Slow  rate;Small sips/bites;Lingual sweep for clearance of pocketing  Postural Changes Seated upright at 90 degrees            CHL IP ORAL PHASE 07/15/2015 Oral Phase Impaired Oral - Pudding Teaspoon -- Oral - Pudding Cup -- Oral - Honey Teaspoon -- Oral - Honey Cup Left anterior bolus loss;Delayed oral transit;Decreased bolus cohesion;Premature spillage;Lingual/palatal residue Oral - Nectar Teaspoon -- Oral - Nectar Cup Left anterior bolus loss;Delayed oral transit;Lingual/palatal residue;Decreased bolus cohesion;Premature spillage Oral - Nectar Straw Lingual/palatal residue;Decreased bolus cohesion;Premature spillage Oral - Thin Teaspoon -- Oral - Thin Cup Left anterior bolus loss;Lingual/palatal residue;Delayed oral transit;Decreased bolus cohesion;Premature spillage Oral - Thin Straw Lingual/palatal residue;Decreased bolus cohesion;Premature spillage Oral - Puree Lingual/palatal residue;Delayed oral transit;Premature spillage Oral - Mech Soft Lingual/palatal residue;Premature spillage;Delayed oral transit;Impaired mastication Oral - Regular -- Oral - Multi-Consistency -- Oral - Pill -- Oral Phase - Comment --  CHL IP PHARYNGEAL PHASE 07/15/2015 Pharyngeal Phase Impaired Pharyngeal- Pudding Teaspoon -- Pharyngeal -- Pharyngeal- Pudding Cup -- Pharyngeal -- Pharyngeal- Honey Teaspoon -- Pharyngeal -- Pharyngeal- Honey Cup Delayed swallow initiation-vallecula;Reduced tongue base retraction;Pharyngeal residue - valleculae Pharyngeal -- Pharyngeal- Nectar Teaspoon -- Pharyngeal -- Pharyngeal- Nectar Cup Delayed swallow initiation-vallecula;Reduced tongue base retraction;Pharyngeal residue - valleculae;Penetration/Aspiration during swallow Pharyngeal Material enters airway, remains ABOVE vocal cords then ejected out Pharyngeal- Nectar Straw -- Pharyngeal -- Pharyngeal- Thin Teaspoon -- Pharyngeal -- Pharyngeal- Thin Cup Delayed swallow initiation-pyriform sinuses;Reduced tongue base retraction;Penetration/Aspiration during  swallow;Pharyngeal residue - valleculae;Pharyngeal residue - pyriform Pharyngeal Material enters airway, remains ABOVE vocal cords and not ejected out Pharyngeal- Thin Straw Delayed swallow initiation-pyriform sinuses;Reduced tongue base retraction;Penetration/Aspiration during swallow;Pharyngeal residue - valleculae;Pharyngeal residue - pyriform Pharyngeal Material enters airway, remains ABOVE vocal cords and not ejected out Pharyngeal- Puree Delayed swallow initiation-vallecula;Reduced tongue base retraction Pharyngeal -- Pharyngeal- Mechanical Soft Delayed swallow initiation-vallecula;Reduced tongue base retraction Pharyngeal -- Pharyngeal- Regular -- Pharyngeal -- Pharyngeal- Multi-consistency -- Pharyngeal -- Pharyngeal- Pill -- Pharyngeal -- Pharyngeal Comment --  CHL IP CERVICAL ESOPHAGEAL PHASE 07/03/2015 Cervical Esophageal Phase WFL Pudding Teaspoon -- Pudding Cup -- Honey Teaspoon -- Honey Cup -- Nectar Teaspoon -- Nectar Cup -- Nectar Straw -- Thin Teaspoon -- Thin Cup -- Thin Straw -- Puree -- Mechanical Soft -- Regular -- Multi-consistency -- Pill -- Cervical Esophageal Comment -- No flowsheet data found. Page, Selinda Orion 07/15/2015, 4:17 PM              Results for orders placed or performed during the hospital encounter of 07/07/15 (from the past 72 hour(s))  Glucose, capillary     Status: Abnormal   Collection Time: 07/23/15 11:37 AM  Result Value Ref Range   Glucose-Capillary 174 (H) 65 - 99 mg/dL   Comment 1 Notify RN   Glucose, capillary     Status: Abnormal   Collection Time: 07/23/15  4:37 PM  Result Value Ref Range   Glucose-Capillary 110 (H) 65 - 99 mg/dL   Comment 1 Notify RN   Glucose, capillary     Status: Abnormal   Collection Time: 07/23/15  9:28 PM  Result Value Ref Range   Glucose-Capillary 104 (H) 65 - 99 mg/dL  Glucose, capillary     Status: Abnormal   Collection Time: 07/24/15  6:58 AM  Result Value Ref Range   Glucose-Capillary 106 (H) 65 - 99 mg/dL  Glucose,  capillary     Status: Abnormal   Collection Time: 07/24/15 11:30 AM  Result Value Ref Range   Glucose-Capillary 149 (H) 65 - 99 mg/dL   Comment 1 Notify RN  Glucose, capillary     Status: None   Collection Time: 07/24/15  4:42 PM  Result Value Ref Range   Glucose-Capillary 98 65 - 99 mg/dL   Comment 1 Notify RN   Glucose, capillary     Status: Abnormal   Collection Time: 07/24/15  8:38 PM  Result Value Ref Range   Glucose-Capillary 123 (H) 65 - 99 mg/dL  CBC     Status: Abnormal   Collection Time: 07/25/15  5:20 AM  Result Value Ref Range   WBC 9.3 4.0 - 10.5 K/uL   RBC 3.16 (L) 3.87 - 5.11 MIL/uL   Hemoglobin 8.9 (L) 12.0 - 15.0 g/dL   HCT 29.6 (L) 36.0 - 46.0 %   MCV 93.7 78.0 - 100.0 fL   MCH 28.2 26.0 - 34.0 pg   MCHC 30.1 30.0 - 36.0 g/dL   RDW 15.6 (H) 11.5 - 15.5 %   Platelets 381 150 - 400 K/uL  Basic metabolic panel     Status: Abnormal   Collection Time: 07/25/15  5:20 AM  Result Value Ref Range   Sodium 136 135 - 145 mmol/L   Potassium 3.9 3.5 - 5.1 mmol/L   Chloride 99 (L) 101 - 111 mmol/L   CO2 25 22 - 32 mmol/L   Glucose, Bld 129 (H) 65 - 99 mg/dL   BUN 11 6 - 20 mg/dL   Creatinine, Ser 0.79 0.44 - 1.00 mg/dL   Calcium 9.7 8.9 - 10.3 mg/dL   GFR calc non Af Amer >60 >60 mL/min   GFR calc Af Amer >60 >60 mL/min    Comment: (NOTE) The eGFR has been calculated using the CKD EPI equation. This calculation has not been validated in all clinical situations. eGFR's persistently <60 mL/min signify possible Chronic Kidney Disease.    Anion gap 12 5 - 15  Glucose, capillary     Status: Abnormal   Collection Time: 07/25/15  6:45 AM  Result Value Ref Range   Glucose-Capillary 144 (H) 65 - 99 mg/dL  Glucose, capillary     Status: Abnormal   Collection Time: 07/25/15 12:11 PM  Result Value Ref Range   Glucose-Capillary 110 (H) 65 - 99 mg/dL  Glucose, capillary     Status: Abnormal   Collection Time: 07/25/15  4:45 PM  Result Value Ref Range    Glucose-Capillary 116 (H) 65 - 99 mg/dL  Glucose, capillary     Status: Abnormal   Collection Time: 07/25/15  9:00 PM  Result Value Ref Range   Glucose-Capillary 139 (H) 65 - 99 mg/dL  Glucose, capillary     Status: Abnormal   Collection Time: 07/26/15  6:55 AM  Result Value Ref Range   Glucose-Capillary 144 (H) 65 - 99 mg/dL     HEENT: normal Cardio: RRR and no murmur Resp: CTA B/L and unlabored GI: BS positive and NT, ND Extremity:  No Edema Skin:   Intact and Other no heel breakdown, no evidence of rash Neuro: Lethargic, Confused, Flat, Cranial Nerve Abnormalities Left CN central 7, Abnormal Sensory reduced pinch sensation in LUE and LLE, Abnormal Motor 0/5 LUE and LLE, Tone:  clonus in Left ankle, Dysarthric and Inattention, left field cut Musc/Skel:  Other no pain with LUE or LLE ROM, tenderness over the R trap. Left heel cord tight as well as finger flexors Gen NAD   Assessment/Plan: 1. Functional deficits secondary to Right MCA infarct with Left HP, Left neglect, Left hemisensory deficits which require 3+ hours per day  of interdisciplinary therapy in a comprehensive inpatient rehab setting. Physiatrist is providing close team supervision and 24 hour management of active medical problems listed below. Physiatrist and rehab team continue to assess barriers to discharge/monitor patient progress toward functional and medical goals. FIM: Function - Bathing Bathing activity did not occur: N/A (night bath? and wanted to complete other activitiy today) Position: Wheelchair/chair at sink Body parts bathed by patient: Left arm, Chest, Abdomen (UB bathing only today) Body parts bathed by helper: Right arm Bathing not applicable: Right arm, Left arm, Chest, Abdomen, Front perineal area, Back (Pt declined UB bathing) Assist Level: 2 helpers  Function- Upper Body Dressing/Undressing Upper body dressing/undressing activity did not occur: N/A (already dressed for the day) What is the  patient wearing?: Pull over shirt/dress Pull over shirt/dress - Perfomed by patient: Thread/unthread right sleeve Pull over shirt/dress - Perfomed by helper: Put head through opening, Thread/unthread left sleeve, Pull shirt over trunk Assist Level: Touching or steadying assistance(Pt > 75%) (modA) Function - Lower Body Dressing/Undressing Lower body dressing/undressing activity did not occur: N/A (already dressed for the day) What is the patient wearing?: Pants, Non-skid slipper socks Position: Bed Pants- Performed by patient: Thread/unthread right pants leg Pants- Performed by helper: Thread/unthread left pants leg, Pull pants up/down, Thread/unthread right pants leg Non-skid slipper socks- Performed by helper: Don/doff right sock, Don/doff left sock Socks - Performed by helper: Don/doff right sock, Don/doff left sock Shoes - Performed by patient: Don/doff right shoe Shoes - Performed by helper: Don/doff right shoe, Don/doff left shoe, Fasten right, Fasten left Assist for footwear: Dependant Assist for lower body dressing: 2 Helpers  Function - Toileting Toileting activity did not occur: No continent bowel/bladder event Assist level: Two helpers  Function - Air cabin crew transfer activity did not occur: Safety/medical concerns Assist level to toilet: 2 helpers Assist level from toilet: 2 helpers  Function - Chair/bed transfer Chair/bed transfer method: Lateral scoot Chair/bed transfer assist level: 2 helpers Chair/bed transfer assistive device: Sliding board Mechanical lift: Maximove Chair/bed transfer details: Verbal cues for sequencing, Verbal cues for technique, Manual facilitation for weight shifting, Manual facilitation for placement, Manual facilitation for weight bearing  Function - Locomotion: Wheelchair Will patient use wheelchair at discharge?: Yes Type: Manual (TIS) Max wheelchair distance: 150 Assist Level: Dependent (Pt equals 0%) Assist Level: Dependent  (Pt equals 0%) Assist Level: Dependent (Pt equals 0%) Function - Locomotion: Ambulation Ambulation activity did not occur: Safety/medical concerns  Function - Comprehension Comprehension: Auditory Comprehension assist level: Understands basic 75 - 89% of the time/ requires cueing 10 - 24% of the time  Function - Expression Expression: Verbal Expression assist level: Expresses basic 75 - 89% of the time/requires cueing 10 - 24% of the time. Needs helper to occlude trach/needs to repeat words.  Function - Social Interaction Social Interaction assist level: Interacts appropriately 75 - 89% of the time - Needs redirection for appropriate language or to initiate interaction.  Function - Problem Solving Problem solving assist level: Solves basic 25 - 49% of the time - needs direction more than half the time to initiate, plan or complete simple activities  Function - Memory Memory assist level: Recognizes or recalls 50 - 74% of the time/requires cueing 25 - 49% of the time Patient normally able to recall (first 3 days only): Current season  Medical Problem List and Plan: 1.  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 No antithrombotic needed.Cont Abx thru 3/20,  2.  DVT Prophylaxis/Anticoagulation: Pharmaceutical: Lovenox,LE venous dopplers neg 3. Pain Management: Has chronic back pain per family. Will use tylenol prn for now    -tramadol-occ HA at noc  - 4. Mood: LCSW to follow for evaluation and support.   5. Neuropsych: This patient is not fully capable of making decisions on her own behalf. 6. Skin/Wound Care: routine pressure relief measures.   7. Fluids/Electrolytes/Nutrition: Monitor I/O.upgraded to D3 nectar, will cont Megace,Intake improved to 25-100%    Getting dehydrated with honey liquids.   IVF at nights and need to encourage fluid intake during the day. 8. 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 PET scan on outpatient basis. 10. Hypokalemia: K+ 3.3 2/26 will recheck in am increase KCL supplement,38mq , will repeat BMET 3/3 normal 11. Cytotoxic edema: Weaned of hypertonic solution.    12. Endocarditis  Antibiotic-End date March 20th , now evidence that this has affected mitral valve causing regurg,elevated ESR she will cont ceftriaxone for her endocarditis for another week unless repeat cx show resistance  13. Anemia: Hgb low but has stayed in 7.6-8.0 range, check stool guaic was pos 1/23, neg on 2/2 and 2/16, Hgb stable at 8.5 2/26, repeat Hgb 8.9 on 3/3   14. Hypotension: Cont to monitor 15. Headache:, Tramadol [id number] prn,improved 16.  Endocarditis with neg repeat BCx2 ,NG x 4d 17.  Severe mitral regurg,  Cardiology repeated ECHO with severe MR now noted, not good surgical candidate, discussed with Dr VaPrescott [id number].  Insomnia improved without meds sleep log 7 hrs LOS (Days) 19 A FACE TO FACE EVALUATION WAS PERFORMED  KIRSTEINS,ANDREW E 07/26/2015, 9:11 AM

## 2015-07-26 NOTE — Progress Notes (Signed)
Physical Therapy Session Note  Patient Details  Name: Becky Patterson MRN: [id number] Date of Birth: 11-25-1955  Today's Date: 07/26/2015 PT Individual Time: 1400-1430 PT Individual Time Calculation (min): 30 min   Short Term Goals: Week 2:  PT Short Term Goal 1 (Week 2): pt will maintain static sitting balance with min A PT Short Term Goal 1 - Progress (Week 2): Not met PT Short Term Goal 2 (Week 2): pt will perform functional transfers with max A PT Short Term Goal 2 - Progress (Week 2): Not met PT Short Term Goal 3 (Week 2): pt will attend to therapeutic task x 1 minute with min cuing PT Short Term Goal 3 - Progress (Week 2): Met Week 3:  PT Short Term Goal 1 (Week 3): = LTG which have been downgraded overall due to lack of progress   Skilled Therapeutic Interventions/Progress Updates:    Session focused on bed mobility re-training with manual facilitation for weightshifting and trunk mobility for supine to sit, static sitting balance edge of bed with max assist due to strong L lateral lean with cues for orientation to midline, functional slideboard transfer with mod to max A +2 into w/c with cues for technique and hand placement to assist, and functional sitting balance activity at sink to brush teeth. Pt required assist for set-up as well as support for maintaining balance in midline during task. Left up in w/c with husband present and safety belt donned.   Therapy Documentation Precautions:  Precautions Precautions: Fall Precaution Comments: Lt neglect , poor trunk control, decreased awareness Restrictions Weight Bearing Restrictions: No  Pain:  Denies pain.    See Function Navigator for Current Functional Status.   Therapy/Group: Individual Therapy  Canary Brim Ivory Broad, PT, DPT  07/26/2015, 3:38 PM

## 2015-07-27 LAB — COMPREHENSIVE METABOLIC PANEL
ALK PHOS: 103 U/L (ref 38–126)
ALT: 21 U/L (ref 14–54)
AST: 31 U/L (ref 15–41)
Albumin: 2.9 g/dL — ABNORMAL LOW (ref 3.5–5.0)
Anion gap: 12 (ref 5–15)
BUN: 16 mg/dL (ref 6–20)
CALCIUM: 9.6 mg/dL (ref 8.9–10.3)
CO2: 26 mmol/L (ref 22–32)
CREATININE: 0.75 mg/dL (ref 0.44–1.00)
Chloride: 98 mmol/L — ABNORMAL LOW (ref 101–111)
GFR calc non Af Amer: >60 mL/min (ref >60)
Glucose, Bld: 135 mg/dL — ABNORMAL HIGH (ref 65–99)
Potassium: 3.8 mmol/L (ref 3.5–5.1)
SODIUM: 136 mmol/L (ref 135–145)
Total Bilirubin: 0.7 mg/dL (ref 0.3–1.2)
Total Protein: 7.6 g/dL (ref 6.5–8.1)

## 2015-07-27 LAB — GLUCOSE, CAPILLARY: Glucose-Capillary: 110 mg/dL — ABNORMAL HIGH (ref 65–99)

## 2015-07-27 NOTE — Progress Notes (Signed)
Subjective/Complaints: Slept well last noc Daughter has question related  To med records for SNF No hx DM, CBG normal ROS- No SOB/CP No abd pain, - N/V, no blood in stool, dark stools  Objective: Vital Signs: Blood pressure 99/60, pulse 84, temperature 98.4 F (36.9 C), temperature source Oral, resp. rate 17, height _0  (1.549 m), weight 80.12 kg (176 lb 10.1 oz), SpO2 97 %. Dg Swallowing Func-speech Pathology  07/15/2015  Objective Swallowing Evaluation: Type of Study: MBS-Modified Barium Swallow Study Patient Details Name: Becky Patterson MRN: [id number] Date of Birth: 12/26/1955 Today's Date: 07/15/2015 Time: SLP Start Time (ACUTE ONLY): 1340 SLP Stop Time (ACUTE ONLY): 1410 SLP Time Calculation (min) (ACUTE ONLY): 30 min Past Medical History: Past Medical History Diagnosis Date . Lung mass  . Bacterial endocarditis  . Splenic infarction  . Epistaxis  Past Surgical History: Past Surgical History Procedure Laterality Date . Back surgery   . Tee without cardioversion N/A 06/24/2015   Procedure: TRANSESOPHAGEAL ECHOCARDIOGRAM (TEE);  Surgeon: Larey Dresser, MD;  Location: [place];  Service: Cardiovascular;  Laterality: N/A; . Radiology with anesthesia N/A 06/29/2015   Procedure: RADIOLOGY WITH ANESTHESIA;  Surgeon: Luanne Bras, MD;  Location: [place];  Service: Radiology;  Laterality: N/A; . Cholecystectomy   HPI: Becky Patterson is a 60 y.o. female history of infective endocarditis that was treated with discharge on 2/2, continuing on antibiotics at home, lung mass noted on that admission. Re-admitted with left sided weakness that progressed after admission. 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  Pt admitted to CIR on 07/07/15.  Tolerating Dys 3, honey thick liquids diet.  Repeat MBS ordered to objectively determine readiness to advance given improvements noted at bedside.  No Data Recorded Assessment / Plan / Recommendation CHL IP CLINICAL IMPRESSIONS 07/15/2015  Therapy Diagnosis Mild pharyngeal phase dysphagia;Mild oral phase dysphagia Clinical Impression Pt presents with improved swallowing function and now presents with a mild oropharyngeal dysphagia with sensory>motor deficits.  Pt presents with left sided labial and lingual weakness which results in anterior loss of boluses, impaired mastication of solids, and prolonged posterior transit of boluses to the oropharynx.  Decreased pharyngeal sensation and base of tongue weakness resulted in poor bolus cohesion and premature spillage of materials into the pharynx with swallow response triggered at the vallecula with thickened liquids and solids, delayed to the pyriforms with thin liquids.  Delay in swallow initiation resulted in penetration of large boluses of nectar thick liquids and thin liquids.  Penetration of thin liquids was greater in amount and occurred deeper into the laryngeal vestibule, requiring increased cues for throat clear and extra swallows to clear.  Chin tuck ineffective largely due to cognitive deficits impacting pt's ability to successfully time strategy during the swallow.   Given pt's significant cognitive deficits, limited mobility, and persistent lethargy, recommend conservative advancement to Dys 3, nectar thick liquids with full supervision for use of swallowing precautions.  Pt would also benefit from implementation of the water protocol to continue working towards liquids advancement.  Prognosis for advancement good given progress made thus far with continued ST interventions for trials of advanced consistencies and use of compensatory swallowing strategies.   Impact on safety and function Mild aspiration risk     Prognosis 07/03/2015 Prognosis for Safe Diet Advancement Good Barriers to Reach Goals -- Barriers/Prognosis Comment -- CHL IP DIET RECOMMENDATION 07/15/2015 SLP Diet Recommendations Dysphagia 3 (Mech soft) solids;Nectar thick liquid Liquid Administration via Cup;No straw Medication  Administration  Crushed with puree Compensations Slow rate;Small sips/bites;Lingual sweep for clearance of pocketing Postural Changes Seated upright at 90 degrees            CHL IP ORAL PHASE 07/15/2015 Oral Phase Impaired Oral - Pudding Teaspoon -- Oral - Pudding Cup -- Oral - Honey Teaspoon -- Oral - Honey Cup Left anterior bolus loss;Delayed oral transit;Decreased bolus cohesion;Premature spillage;Lingual/palatal residue Oral - Nectar Teaspoon -- Oral - Nectar Cup Left anterior bolus loss;Delayed oral transit;Lingual/palatal residue;Decreased bolus cohesion;Premature spillage Oral - Nectar Straw Lingual/palatal residue;Decreased bolus cohesion;Premature spillage Oral - Thin Teaspoon -- Oral - Thin Cup Left anterior bolus loss;Lingual/palatal residue;Delayed oral transit;Decreased bolus cohesion;Premature spillage Oral - Thin Straw Lingual/palatal residue;Decreased bolus cohesion;Premature spillage Oral - Puree Lingual/palatal residue;Delayed oral transit;Premature spillage Oral - Mech Soft Lingual/palatal residue;Premature spillage;Delayed oral transit;Impaired mastication Oral - Regular -- Oral - Multi-Consistency -- Oral - Pill -- Oral Phase - Comment --  CHL IP PHARYNGEAL PHASE 07/15/2015 Pharyngeal Phase Impaired Pharyngeal- Pudding Teaspoon -- Pharyngeal -- Pharyngeal- Pudding Cup -- Pharyngeal -- Pharyngeal- Honey Teaspoon -- Pharyngeal -- Pharyngeal- Honey Cup Delayed swallow initiation-vallecula;Reduced tongue base retraction;Pharyngeal residue - valleculae Pharyngeal -- Pharyngeal- Nectar Teaspoon -- Pharyngeal -- Pharyngeal- Nectar Cup Delayed swallow initiation-vallecula;Reduced tongue base retraction;Pharyngeal residue - valleculae;Penetration/Aspiration during swallow Pharyngeal Material enters airway, remains ABOVE vocal cords then ejected out Pharyngeal- Nectar Straw -- Pharyngeal -- Pharyngeal- Thin Teaspoon -- Pharyngeal -- Pharyngeal- Thin Cup Delayed swallow initiation-pyriform sinuses;Reduced  tongue base retraction;Penetration/Aspiration during swallow;Pharyngeal residue - valleculae;Pharyngeal residue - pyriform Pharyngeal Material enters airway, remains ABOVE vocal cords and not ejected out Pharyngeal- Thin Straw Delayed swallow initiation-pyriform sinuses;Reduced tongue base retraction;Penetration/Aspiration during swallow;Pharyngeal residue - valleculae;Pharyngeal residue - pyriform Pharyngeal Material enters airway, remains ABOVE vocal cords and not ejected out Pharyngeal- Puree Delayed swallow initiation-vallecula;Reduced tongue base retraction Pharyngeal -- Pharyngeal- Mechanical Soft Delayed swallow initiation-vallecula;Reduced tongue base retraction Pharyngeal -- Pharyngeal- Regular -- Pharyngeal -- Pharyngeal- Multi-consistency -- Pharyngeal -- Pharyngeal- Pill -- Pharyngeal -- Pharyngeal Comment --  CHL IP CERVICAL ESOPHAGEAL PHASE 07/03/2015 Cervical Esophageal Phase WFL Pudding Teaspoon -- Pudding Cup -- Honey Teaspoon -- Honey Cup -- Nectar Teaspoon -- Nectar Cup -- Nectar Straw -- Thin Teaspoon -- Thin Cup -- Thin Straw -- Puree -- Mechanical Soft -- Regular -- Multi-consistency -- Pill -- Cervical Esophageal Comment -- No flowsheet data found. Page, Selinda Orion 07/15/2015, 4:17 PM              Results for orders placed or performed during the hospital encounter of 07/07/15 (from the past 72 hour(s))  Glucose, capillary     Status: Abnormal   Collection Time: 07/24/15 11:30 AM  Result Value Ref Range   Glucose-Capillary 149 (H) 65 - 99 mg/dL   Comment 1 Notify RN   Glucose, capillary     Status: None   Collection Time: 07/24/15  4:42 PM  Result Value Ref Range   Glucose-Capillary 98 65 - 99 mg/dL   Comment 1 Notify RN   Glucose, capillary     Status: Abnormal   Collection Time: 07/24/15  8:38 PM  Result Value Ref Range   Glucose-Capillary 123 (H) 65 - 99 mg/dL  CBC     Status: Abnormal   Collection Time: 07/25/15  5:20 AM  Result Value Ref Range   WBC 9.3 4.0 - 10.5 K/uL    RBC 3.16 (L) 3.87 - 5.11 MIL/uL   Hemoglobin 8.9 (L) 12.0 - 15.0 g/dL   HCT 29.6 (L) 36.0 - 46.0 %  MCV 93.7 78.0 - 100.0 fL   MCH 28.2 26.0 - 34.0 pg   MCHC 30.1 30.0 - 36.0 g/dL   RDW 15.6 (H) 11.5 - 15.5 %   Platelets 381 150 - 400 K/uL  Basic metabolic panel     Status: Abnormal   Collection Time: 07/25/15  5:20 AM  Result Value Ref Range   Sodium 136 135 - 145 mmol/L   Potassium 3.9 3.5 - 5.1 mmol/L   Chloride 99 (L) 101 - 111 mmol/L   CO2 25 22 - 32 mmol/L   Glucose, Bld 129 (H) 65 - 99 mg/dL   BUN 11 6 - 20 mg/dL   Creatinine, Ser 0.79 0.44 - 1.00 mg/dL   Calcium 9.7 8.9 - 10.3 mg/dL   GFR calc non Af Amer >60 >60 mL/min   GFR calc Af Amer >60 >60 mL/min    Comment: (NOTE) The eGFR has been calculated using the CKD EPI equation. This calculation has not been validated in all clinical situations. eGFR's persistently <60 mL/min signify possible Chronic Kidney Disease.    Anion gap 12 5 - 15  Glucose, capillary     Status: Abnormal   Collection Time: 07/25/15  6:45 AM  Result Value Ref Range   Glucose-Capillary 144 (H) 65 - 99 mg/dL  Glucose, capillary     Status: Abnormal   Collection Time: 07/25/15 12:11 PM  Result Value Ref Range   Glucose-Capillary 110 (H) 65 - 99 mg/dL  Glucose, capillary     Status: Abnormal   Collection Time: 07/25/15  4:45 PM  Result Value Ref Range   Glucose-Capillary 116 (H) 65 - 99 mg/dL  Glucose, capillary     Status: Abnormal   Collection Time: 07/25/15  9:00 PM  Result Value Ref Range   Glucose-Capillary 139 (H) 65 - 99 mg/dL  Glucose, capillary     Status: Abnormal   Collection Time: 07/26/15  6:55 AM  Result Value Ref Range   Glucose-Capillary 144 (H) 65 - 99 mg/dL  Glucose, capillary     Status: Abnormal   Collection Time: 07/26/15 11:47 AM  Result Value Ref Range   Glucose-Capillary 113 (H) 65 - 99 mg/dL  Glucose, capillary     Status: Abnormal   Collection Time: 07/26/15  4:38 PM  Result Value Ref Range    Glucose-Capillary 128 (H) 65 - 99 mg/dL  Glucose, capillary     Status: Abnormal   Collection Time: 07/26/15  9:02 PM  Result Value Ref Range   Glucose-Capillary 132 (H) 65 - 99 mg/dL  Glucose, capillary     Status: Abnormal   Collection Time: 07/27/15  6:50 AM  Result Value Ref Range   Glucose-Capillary 110 (H) 65 - 99 mg/dL     HEENT: normal Cardio: RRR and no murmur Resp: CTA B/L and unlabored GI: BS positive and NT, ND Extremity:  No Edema Skin:   Intact and Other no heel breakdown, no evidence of rash Neuro: Lethargic, Confused, Flat, Cranial Nerve Abnormalities Left CN central 7, Abnormal Sensory reduced pinch sensation in LUE and LLE, Abnormal Motor 0/5 LUE and LLE, Tone:  clonus in Left ankle, Dysarthric and Inattention, left field cut Musc/Skel:  Other no pain with LUE or LLE ROM, tenderness over the R trap. Left heel cord tight as well as finger flexors Gen NAD   Assessment/Plan: 1. Functional deficits secondary to Right MCA infarct with Left HP, Left neglect, Left hemisensory deficits which require 3+ hours per day of interdisciplinary  therapy in a comprehensive inpatient rehab setting. Physiatrist is providing close team supervision and 24 hour management of active medical problems listed below. Physiatrist and rehab team continue to assess barriers to discharge/monitor patient progress toward functional and medical goals. FIM: Function - Bathing Bathing activity did not occur: N/A (night bath? and wanted to complete other activitiy today) Position: Wheelchair/chair at sink Body parts bathed by patient: Left arm, Chest, Abdomen (UB bathing only today) Body parts bathed by helper: Right arm Bathing not applicable: Right arm, Left arm, Chest, Abdomen, Front perineal area, Back (Pt declined UB bathing) Assist Level: 2 helpers  Function- Upper Body Dressing/Undressing Upper body dressing/undressing activity did not occur: N/A (already dressed for the day) What is the  patient wearing?: Pull over shirt/dress Pull over shirt/dress - Perfomed by patient: Thread/unthread right sleeve Pull over shirt/dress - Perfomed by helper: Put head through opening, Thread/unthread left sleeve, Pull shirt over trunk Assist Level: Touching or steadying assistance(Pt > 75%) (modA) Function - Lower Body Dressing/Undressing Lower body dressing/undressing activity did not occur: N/A (already dressed for the day) What is the patient wearing?: Pants, Non-skid slipper socks Position: Bed Pants- Performed by patient: Thread/unthread right pants leg Pants- Performed by helper: Thread/unthread left pants leg, Pull pants up/down, Thread/unthread right pants leg Non-skid slipper socks- Performed by helper: Don/doff right sock, Don/doff left sock Socks - Performed by helper: Don/doff right sock, Don/doff left sock Shoes - Performed by patient: Don/doff right shoe Shoes - Performed by helper: Don/doff right shoe, Don/doff left shoe, Fasten right, Fasten left Assist for footwear: Dependant Assist for lower body dressing: 2 Helpers  Function - Toileting Toileting activity did not occur: No continent bowel/bladder event Toileting steps completed by helper: Adjust clothing prior to toileting, Performs perineal hygiene, Adjust clothing after toileting Assist level: Two helpers  Function - Archivist transfer activity did not occur: Safety/medical concerns Assist level to toilet: 2 helpers Assist level from toilet: 2 helpers  Function - Chair/bed transfer Chair/bed transfer method: Lateral scoot Chair/bed transfer assist level: 2 helpers Chair/bed transfer assistive device: Sliding board Mechanical lift: Maximove Chair/bed transfer details: Verbal cues for sequencing, Verbal cues for technique, Manual facilitation for weight shifting, Manual facilitation for placement, Manual facilitation for weight bearing  Function - Locomotion: Wheelchair Will patient use wheelchair  at discharge?: Yes Type: Manual (TIS) Max wheelchair distance: 150 Assist Level: Dependent (Pt equals 0%) Assist Level: Dependent (Pt equals 0%) Assist Level: Dependent (Pt equals 0%) Function - Locomotion: Ambulation Ambulation activity did not occur: Safety/medical concerns  Function - Comprehension Comprehension: Auditory Comprehension assist level: Understands basic 75 - 89% of the time/ requires cueing 10 - 24% of the time  Function - Expression Expression: Verbal Expression assist level: Expresses basic 75 - 89% of the time/requires cueing 10 - 24% of the time. Needs helper to occlude trach/needs to repeat words.  Function - Social Interaction Social Interaction assist level: Interacts appropriately 75 - 89% of the time - Needs redirection for appropriate language or to initiate interaction.  Function - Problem Solving Problem solving assist level: Solves basic 25 - 49% of the time - needs direction more than half the time to initiate, plan or complete simple activities  Function - Memory Memory assist level: Recognizes or recalls 50 - 74% of the time/requires cueing 25 - 49% of the time Patient normally able to recall (first 3 days only): Current season  Medical Problem List and Plan: 1.  Hemiplegia, dysphagia, sensory disturbance secondary to large  R-MCA territory stroke due to septic emboli. No antithrombotic needed.Cont Abx thru 3/20,  2.  DVT Prophylaxis/Anticoagulation: Pharmaceutical: Lovenox,LE venous dopplers neg 3. Pain Management: Has chronic back pain per family. Will use tylenol prn for now    -tramadol-occ HA at noc  - 4. Mood: LCSW to follow for evaluation and support.   5. Neuropsych: This patient is not fully capable of making decisions on her own behalf. 6. Skin/Wound Care: routine pressure relief measures.   7. Fluids/Electrolytes/Nutrition: Monitor I/O.upgraded to D3 nectar, repeat CMET to check alb and total protein   ,Intake improved to 100%     Getting dehydrated with honey liquids.   IVF at nights and need to encourage fluid intake during the day. 8. 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 PET scan on outpatient basis. 10. Hypokalemia: K+ 3.3 2/26 will recheck in am increase KCL supplement,36mq , will repeat BMET 3/3 normal 11. Cytotoxic edema: Weaned of hypertonic solution.    12. Endocarditis  Antibiotic-End date March 20th , now evidence that this has affected mitral valve causing regurg,elevated ESR she will cont ceftriaxone for her endocarditis for another week unless repeat cx show resistance  13. Anemia: Hgb low but has stayed in 7.6-8.0 range, check stool guaic was pos 1/23, neg on 2/2 and 2/16, Hgb stable at 8.5 2/26, repeat Hgb 8.9 on 3/3   14. Hypotension: Cont to monitor 15. Headache:, Tramadol [id number] prn,improved 16.  Endocarditis with neg repeat BCx2 ,NG x 5d-final 17.  Severe mitral regurg,  Cardiology repeated ECHO with severe MR now noted, not good surgical candidate, discussed with Dr VaPrescott [id number].  Insomnia improved without meds sleep log 7 hrs, 11p-6a LOS (Days) 20 A FACE TO FACE EVALUATION WAS PERFORMED  Deyanira Fesler E 07/27/2015, 7:57 AM

## 2015-07-28 ENCOUNTER — Inpatient Hospital Stay (HOSPITAL_COMMUNITY): Payer: 59 | Admitting: Occupational Therapy

## 2015-07-28 ENCOUNTER — Inpatient Hospital Stay (HOSPITAL_COMMUNITY): Payer: 59 | Admitting: Speech Pathology

## 2015-07-28 ENCOUNTER — Inpatient Hospital Stay (HOSPITAL_COMMUNITY): Payer: 59 | Admitting: Physical Therapy

## 2015-07-28 LAB — CREATININE, SERUM: Creatinine, Ser: 0.77 mg/dL (ref 0.44–1.00)

## 2015-07-28 MED ORDER — GABAPENTIN 100 MG PO CAPS
100.0000 mg | ORAL_CAPSULE | Freq: Two times a day (BID) | ORAL | Status: DC
  Administered 2015-07-28 – 2015-07-30 (×6): 100 mg via ORAL
  Filled 2015-07-28 (×6): qty 1

## 2015-07-28 MED ORDER — TIZANIDINE HCL 2 MG PO TABS
2.0000 mg | ORAL_TABLET | Freq: Three times a day (TID) | ORAL | Status: DC
  Administered 2015-07-28 – 2015-07-30 (×9): 2 mg via ORAL
  Filled 2015-07-28 (×9): qty 1

## 2015-07-28 NOTE — Progress Notes (Signed)
Subjective/Complaints: Increasing pain LUE not at rest but with simple movements such as arm repositioning ROS- No SOB/CP No abd pain, - N/V, no blood in stool, dark stools  Objective: Vital Signs: Blood pressure 90/62, pulse 82, temperature 98.4 F (36.9 C), temperature source Oral, resp. rate 19, height _0  (1.549 m), weight 80.23 kg (176 lb 14 oz), SpO2 96 %. Dg Swallowing Func-speech Pathology  07/15/2015  Objective Swallowing Evaluation: Type of Study: MBS-Modified Barium Swallow Study Patient Details Name: Becky Patterson MRN: [id number] Date of Birth: 1955/10/31 Today's Date: 07/15/2015 Time: SLP Start Time (ACUTE ONLY): 1340 SLP Stop Time (ACUTE ONLY): 1410 SLP Time Calculation (min) (ACUTE ONLY): 30 min Past Medical History: Past Medical History Diagnosis Date . Lung mass  . Bacterial endocarditis  . Splenic infarction  . Epistaxis  Past Surgical History: Past Surgical History Procedure Laterality Date . Back surgery   . Tee without cardioversion N/A 06/24/2015   Procedure: TRANSESOPHAGEAL ECHOCARDIOGRAM (TEE);  Surgeon: Larey Dresser, MD;  Location: [place];  Service: Cardiovascular;  Laterality: N/A; . Radiology with anesthesia N/A 06/29/2015   Procedure: RADIOLOGY WITH ANESTHESIA;  Surgeon: Luanne Bras, MD;  Location: [place];  Service: Radiology;  Laterality: N/A; . Cholecystectomy   HPI: Becky Patterson is a 60 y.o. female history of infective endocarditis that was treated with discharge on 2/2, continuing on antibiotics at home, lung mass noted on that admission. Re-admitted with left sided weakness that progressed after admission. 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  Pt admitted to CIR on 07/07/15.  Tolerating Dys 3, honey thick liquids diet.  Repeat MBS ordered to objectively determine readiness to advance given improvements noted at bedside.  No Data Recorded Assessment / Plan / Recommendation CHL IP CLINICAL IMPRESSIONS 07/15/2015 Therapy Diagnosis  Mild pharyngeal phase dysphagia;Mild oral phase dysphagia Clinical Impression Pt presents with improved swallowing function and now presents with a mild oropharyngeal dysphagia with sensory>motor deficits.  Pt presents with left sided labial and lingual weakness which results in anterior loss of boluses, impaired mastication of solids, and prolonged posterior transit of boluses to the oropharynx.  Decreased pharyngeal sensation and base of tongue weakness resulted in poor bolus cohesion and premature spillage of materials into the pharynx with swallow response triggered at the vallecula with thickened liquids and solids, delayed to the pyriforms with thin liquids.  Delay in swallow initiation resulted in penetration of large boluses of nectar thick liquids and thin liquids.  Penetration of thin liquids was greater in amount and occurred deeper into the laryngeal vestibule, requiring increased cues for throat clear and extra swallows to clear.  Chin tuck ineffective largely due to cognitive deficits impacting pt's ability to successfully time strategy during the swallow.   Given pt's significant cognitive deficits, limited mobility, and persistent lethargy, recommend conservative advancement to Dys 3, nectar thick liquids with full supervision for use of swallowing precautions.  Pt would also benefit from implementation of the water protocol to continue working towards liquids advancement.  Prognosis for advancement good given progress made thus far with continued ST interventions for trials of advanced consistencies and use of compensatory swallowing strategies.   Impact on safety and function Mild aspiration risk     Prognosis 07/03/2015 Prognosis for Safe Diet Advancement Good Barriers to Reach Goals -- Barriers/Prognosis Comment -- CHL IP DIET RECOMMENDATION 07/15/2015 SLP Diet Recommendations Dysphagia 3 (Mech soft) solids;Nectar thick liquid Liquid Administration via Cup;No straw Medication Administration Crushed  with puree Compensations Slow  rate;Small sips/bites;Lingual sweep for clearance of pocketing Postural Changes Seated upright at 90 degrees            CHL IP ORAL PHASE 07/15/2015 Oral Phase Impaired Oral - Pudding Teaspoon -- Oral - Pudding Cup -- Oral - Honey Teaspoon -- Oral - Honey Cup Left anterior bolus loss;Delayed oral transit;Decreased bolus cohesion;Premature spillage;Lingual/palatal residue Oral - Nectar Teaspoon -- Oral - Nectar Cup Left anterior bolus loss;Delayed oral transit;Lingual/palatal residue;Decreased bolus cohesion;Premature spillage Oral - Nectar Straw Lingual/palatal residue;Decreased bolus cohesion;Premature spillage Oral - Thin Teaspoon -- Oral - Thin Cup Left anterior bolus loss;Lingual/palatal residue;Delayed oral transit;Decreased bolus cohesion;Premature spillage Oral - Thin Straw Lingual/palatal residue;Decreased bolus cohesion;Premature spillage Oral - Puree Lingual/palatal residue;Delayed oral transit;Premature spillage Oral - Mech Soft Lingual/palatal residue;Premature spillage;Delayed oral transit;Impaired mastication Oral - Regular -- Oral - Multi-Consistency -- Oral - Pill -- Oral Phase - Comment --  CHL IP PHARYNGEAL PHASE 07/15/2015 Pharyngeal Phase Impaired Pharyngeal- Pudding Teaspoon -- Pharyngeal -- Pharyngeal- Pudding Cup -- Pharyngeal -- Pharyngeal- Honey Teaspoon -- Pharyngeal -- Pharyngeal- Honey Cup Delayed swallow initiation-vallecula;Reduced tongue base retraction;Pharyngeal residue - valleculae Pharyngeal -- Pharyngeal- Nectar Teaspoon -- Pharyngeal -- Pharyngeal- Nectar Cup Delayed swallow initiation-vallecula;Reduced tongue base retraction;Pharyngeal residue - valleculae;Penetration/Aspiration during swallow Pharyngeal Material enters airway, remains ABOVE vocal cords then ejected out Pharyngeal- Nectar Straw -- Pharyngeal -- Pharyngeal- Thin Teaspoon -- Pharyngeal -- Pharyngeal- Thin Cup Delayed swallow initiation-pyriform sinuses;Reduced tongue base  retraction;Penetration/Aspiration during swallow;Pharyngeal residue - valleculae;Pharyngeal residue - pyriform Pharyngeal Material enters airway, remains ABOVE vocal cords and not ejected out Pharyngeal- Thin Straw Delayed swallow initiation-pyriform sinuses;Reduced tongue base retraction;Penetration/Aspiration during swallow;Pharyngeal residue - valleculae;Pharyngeal residue - pyriform Pharyngeal Material enters airway, remains ABOVE vocal cords and not ejected out Pharyngeal- Puree Delayed swallow initiation-vallecula;Reduced tongue base retraction Pharyngeal -- Pharyngeal- Mechanical Soft Delayed swallow initiation-vallecula;Reduced tongue base retraction Pharyngeal -- Pharyngeal- Regular -- Pharyngeal -- Pharyngeal- Multi-consistency -- Pharyngeal -- Pharyngeal- Pill -- Pharyngeal -- Pharyngeal Comment --  CHL IP CERVICAL ESOPHAGEAL PHASE 07/03/2015 Cervical Esophageal Phase WFL Pudding Teaspoon -- Pudding Cup -- Honey Teaspoon -- Honey Cup -- Nectar Teaspoon -- Nectar Cup -- Nectar Straw -- Thin Teaspoon -- Thin Cup -- Thin Straw -- Puree -- Mechanical Soft -- Regular -- Multi-consistency -- Pill -- Cervical Esophageal Comment -- No flowsheet data found. Page, Selinda Orion 07/15/2015, 4:17 PM              Results for orders placed or performed during the hospital encounter of 07/07/15 (from the past 72 hour(s))  Glucose, capillary     Status: Abnormal   Collection Time: 07/25/15 12:11 PM  Result Value Ref Range   Glucose-Capillary 110 (H) 65 - 99 mg/dL  Glucose, capillary     Status: Abnormal   Collection Time: 07/25/15  4:45 PM  Result Value Ref Range   Glucose-Capillary 116 (H) 65 - 99 mg/dL  Glucose, capillary     Status: Abnormal   Collection Time: 07/25/15  9:00 PM  Result Value Ref Range   Glucose-Capillary 139 (H) 65 - 99 mg/dL  Glucose, capillary     Status: Abnormal   Collection Time: 07/26/15  6:55 AM  Result Value Ref Range   Glucose-Capillary 144 (H) 65 - 99 mg/dL  Glucose, capillary      Status: Abnormal   Collection Time: 07/26/15 11:47 AM  Result Value Ref Range   Glucose-Capillary 113 (H) 65 - 99 mg/dL  Glucose, capillary     Status: Abnormal   Collection Time:  07/26/15  4:38 PM  Result Value Ref Range   Glucose-Capillary 128 (H) 65 - 99 mg/dL  Glucose, capillary     Status: Abnormal   Collection Time: 07/26/15  9:02 PM  Result Value Ref Range   Glucose-Capillary 132 (H) 65 - 99 mg/dL  Glucose, capillary     Status: Abnormal   Collection Time: 07/27/15  6:50 AM  Result Value Ref Range   Glucose-Capillary 110 (H) 65 - 99 mg/dL  Comprehensive metabolic panel     Status: Abnormal   Collection Time: 07/27/15  8:29 AM  Result Value Ref Range   Sodium 136 135 - 145 mmol/L   Potassium 3.8 3.5 - 5.1 mmol/L   Chloride 98 (L) 101 - 111 mmol/L   CO2 26 22 - 32 mmol/L   Glucose, Bld 135 (H) 65 - 99 mg/dL   BUN 16 6 - 20 mg/dL   Creatinine, Ser 0.75 0.44 - 1.00 mg/dL   Calcium 9.6 8.9 - 10.3 mg/dL   Total Protein 7.6 6.5 - 8.1 g/dL   Albumin 2.9 (L) 3.5 - 5.0 g/dL   AST 31 15 - 41 U/L   ALT 21 14 - 54 U/L   Alkaline Phosphatase 103 38 - 126 U/L   Total Bilirubin 0.7 0.3 - 1.2 mg/dL   GFR calc non Af Amer >60 >60 mL/min   GFR calc Af Amer >60 >60 mL/min    Comment: (NOTE) The eGFR has been calculated using the CKD EPI equation. This calculation has not been validated in all clinical situations. eGFR's persistently <60 mL/min signify possible Chronic Kidney Disease.    Anion gap 12 5 - 15  Creatinine, serum     Status: None   Collection Time: 07/28/15  5:46 AM  Result Value Ref Range   Creatinine, Ser 0.77 0.44 - 1.00 mg/dL   GFR calc non Af Amer >60 >60 mL/min   GFR calc Af Amer >60 >60 mL/min    Comment: (NOTE) The eGFR has been calculated using the CKD EPI equation. This calculation has not been validated in all clinical situations. eGFR's persistently <60 mL/min signify possible Chronic Kidney Disease.      HEENT: normal Cardio: RRR and no  murmur Resp: CTA B/L and unlabored GI: BS positive and NT, ND Extremity:  No Edema Skin:   Intact and Other no heel breakdown, no evidence of rash Neuro: Lethargic, Confused, Flat, Cranial Nerve Abnormalities Left CN central 7, Abnormal Sensory reduced pinch sensation in LUE and LLE, Abnormal Motor 0/5 LUE and LLE, Tone:  clonus in Left ankle, Dysarthric and Inattention, left field cut MAS 2- 3 in left biceps and finger flexors Musc/Skel:  Other no pain with LUE or LLE ROM, tenderness over the R trap. Left heel cord tight as well as finger flexors Gen NAD   Assessment/Plan: 1. Functional deficits secondary to Right MCA infarct with Left HP, Left neglect, Left hemisensory deficits which require 3+ hours per day of interdisciplinary therapy in a comprehensive inpatient rehab setting. Physiatrist is providing close team supervision and 24 hour management of active medical problems listed below. Physiatrist and rehab team continue to assess barriers to discharge/monitor patient progress toward functional and medical goals. FIM: Function - Bathing Bathing activity did not occur: N/A (night bath? and wanted to complete other activitiy today) Position: Wheelchair/chair at sink Body parts bathed by patient: Left arm, Chest, Abdomen (UB bathing only today) Body parts bathed by helper: Right arm Bathing not applicable: Right arm, Left arm,  Chest, Abdomen, Front perineal area, Back (Pt declined UB bathing) Assist Level: 2 helpers  Function- Upper Body Dressing/Undressing Upper body dressing/undressing activity did not occur: N/A (already dressed for the day) What is the patient wearing?: Pull over shirt/dress Pull over shirt/dress - Perfomed by patient: Thread/unthread right sleeve Pull over shirt/dress - Perfomed by helper: Put head through opening, Thread/unthread left sleeve, Pull shirt over trunk Assist Level: Touching or steadying assistance(Pt > 75%) (modA) Function - Lower Body  Dressing/Undressing Lower body dressing/undressing activity did not occur: N/A (already dressed for the day) What is the patient wearing?: Pants, Non-skid slipper socks Position: Bed Pants- Performed by patient: Thread/unthread right pants leg Pants- Performed by helper: Thread/unthread left pants leg, Pull pants up/down, Thread/unthread right pants leg Non-skid slipper socks- Performed by helper: Don/doff right sock, Don/doff left sock Socks - Performed by helper: Don/doff right sock, Don/doff left sock Shoes - Performed by patient: Don/doff right shoe Shoes - Performed by helper: Don/doff right shoe, Don/doff left shoe, Fasten right, Fasten left Assist for footwear: Dependant Assist for lower body dressing: 2 Helpers  Function - Toileting Toileting activity did not occur: No continent bowel/bladder event Toileting steps completed by helper: Adjust clothing prior to toileting, Performs perineal hygiene, Adjust clothing after toileting Assist level: Two helpers  Function - Air cabin crew transfer activity did not occur: Safety/medical concerns Assist level to toilet: 2 helpers Assist level from toilet: 2 helpers  Function - Chair/bed transfer Chair/bed transfer method: Lateral scoot Chair/bed transfer assist level: 2 helpers Chair/bed transfer assistive device: Sliding board Mechanical lift: Maximove Chair/bed transfer details: Verbal cues for sequencing, Verbal cues for technique, Manual facilitation for weight shifting, Manual facilitation for placement, Manual facilitation for weight bearing  Function - Locomotion: Wheelchair Will patient use wheelchair at discharge?: Yes Type: Manual (TIS) Max wheelchair distance: 150 Assist Level: Dependent (Pt equals 0%) Assist Level: Dependent (Pt equals 0%) Assist Level: Dependent (Pt equals 0%) Function - Locomotion: Ambulation Ambulation activity did not occur: Safety/medical concerns  Function -  Comprehension Comprehension: Auditory Comprehension assist level: Understands basic 75 - 89% of the time/ requires cueing 10 - 24% of the time  Function - Expression Expression: Verbal Expression assist level: Expresses basic 75 - 89% of the time/requires cueing 10 - 24% of the time. Needs helper to occlude trach/needs to repeat words.  Function - Social Interaction Social Interaction assist level: Interacts appropriately 75 - 89% of the time - Needs redirection for appropriate language or to initiate interaction.  Function - Problem Solving Problem solving assist level: Solves basic 25 - 49% of the time - needs direction more than half the time to initiate, plan or complete simple activities  Function - Memory Memory assist level: Recognizes or recalls 50 - 74% of the time/requires cueing 25 - 49% of the time Patient normally able to recall (first 3 days only): Current season  Medical Problem List and Plan: 1.  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 No antithrombotic needed.Cont Abx thru 3/20,  2.  DVT Prophylaxis/Anticoagulation: Pharmaceutical: Lovenox,LE venous dopplers neg 3. Pain Management: Has chronic back pain per family. Will use tylenol prn for now    -LUE hypersensitivity add gabapentin low dose and titrate  -LUE and LLE tone will add Zanaflex  - 4. Mood: LCSW to follow for evaluation and support.   5. Neuropsych: This patient is not fully capable of making decisions on her own behalf. 6. Skin/Wound Care: routine pressure  relief measures.   7. Fluids/Electrolytes/Nutrition: Monitor I/O.upgraded to D3 nectar, repeat CMET to check alb and total protein   ,Intake improved to 100%    Getting dehydrated with honey liquids.   IVF at nights and need to encourage fluid intake during the day. 8. 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 PET scan on outpatient basis. 10. Hypokalemia: K+ 3.3 2/26 will recheck in am increase KCL supplement,62mq , will repeat BMET 3/3 normal 11. Cytotoxic edema: Weaned of hypertonic solution.    12. Endocarditis  Antibiotic-End date March 20th , now evidence that this has affected mitral valve causing regurg,elevated ESR she will cont ceftriaxone for her endocarditis for another week unless repeat cx show resistance  13. Anemia: Hgb low but has stayed in 7.6-8.0 range, check stool guaic was pos 1/23, neg on 2/2 and 2/16, Hgb stable at 8.5 2/26, repeat Hgb 8.9 on 3/3   14. Hypotension: Cont to monitor 15. Headache:, Tramadol 108mQ6h prn,improved 16.  Bowel and bladder incont ~50% 17.  Severe mitral regurg,  Cardiology repeated ECHO with severe MR now noted, not good surgical candidate, discussed with Dr VaPrescott [id number].  Insomnia improved without meds sleep log 7 hrs, 11p-6a LOS (Days) 21 A FACE TO FACE EVALUATION WAS PERFORMED  Chiquita Heckert E 07/28/2015, 8:19 AM

## 2015-07-28 NOTE — Progress Notes (Signed)
Physical Therapy Session Note  Patient Details  Name: Becky Patterson MRN: [id number] Date of Birth: 1955-09-19  Today's Date: 07/28/2015 PT Individual Time: 1884-1660 PT Individual Time Calculation (min): 72 min   Short Term Goals: Week 3:  PT Short Term Goal 1 (Week 3): = LTG which have been downgraded overall due to lack of progress   Skilled Therapeutic Interventions/Progress Updates:   Pt received in tilt in space w/c with husband present.  Pt transported to gym and performed squat pivot transfer to R side with max A of one person with pt demonstrating improved initiation of reaching, coming to squat position and pivot to R side with therapist providing assistance over the back to maintain forwards and R lateral weight shifting.  Initially seated on mat pt demonstrating ability to sit with supervision and trunk in midline; performed NMR: see below for details.  Explained to pt and husband the need to transition and trial regular manual w/c to continued to progress pt participation in w/c mobility and demand on trunk to improve trunk and head control as well as to prepare for SNF since they will more than likely not have a tilt in space w/c.  Pt performed transfer squat pivot to R into w/c with max A of one person and leg rests adjusted to pt leg length for foot support.  Unable to begin w/c mobility training today due to pt too tall with cushion in place; will likely have to perform w/c mobility training with cushion out of w/c.  Returned to room where pt performed oral hygiene at sink with assistance and verbal cues to scan sink for items needed and to turn water on/off; pt supervised with water protocol.  Performed transfer squat pivot w/c > bed to L with +2 A due to decreased ability to clear buttocks onto bed.  Performed sit > supine and scooting up in bed with +2 A.  Pt left with all items within reach and husband present.     Therapy Documentation Precautions:  Precautions Precautions:  Fall Precaution Comments: Lt neglect , poor trunk control, decreased awareness Restrictions Weight Bearing Restrictions: No Pain: Pain Assessment Pain Assessment: No/denies pain Other Treatments: Treatments Neuromuscular Facilitation: Right;Left;Upper Extremity;Lower Extremity;Forced use;Activity to increase motor control;Activity to increase timing and sequencing;Activity to increase grading;Activity to increase sustained activation;Activity to increase lateral weight shifting;Activity to increase anterior-posterior weight shifting during sit <> stand and static standing with use of mirror for visual feedback on trunk, head and LE positioning during lateral weight shifting, WB through LLE and stepping RLE forwards, back and in.  Also performed seated trunk control training with LUE supported on rolling table and L pelvis wedged to improve L trunk shortening and weight shift to R with feet unsupported to minimize pushing; pt cued to maintain trunk in midline while performing forwards and backwards lean to neutral pushing table forwards and back for activation and input through LUE and trunk.    See Function Navigator for Current Functional Status.   Therapy/Group: Individual Therapy  Raylene Everts [facility] 07/28/2015, 3:53 PM

## 2015-07-28 NOTE — Progress Notes (Signed)
Recreational Therapy Session Note  Patient Details  Name: Becky Patterson MRN: [id number] Date of Birth: 04-28-1956 Today's Date: 07/28/2015  Pain: no c/o  Pt participated in animal assisted activity/therapy seated in tilt in space w/c, husband present with supervision, mod cues for visual scanning/attention.   [place] 07/28/2015, 3:36 PM

## 2015-07-28 NOTE — Progress Notes (Signed)
Nutrition Follow-up  DOCUMENTATION CODES:   Obesity unspecified  INTERVENTION:  Continue 30 ml Prostat po BID, each supplement provides 100 kcal and 15 grams of protein.   Encourage adequate PO intake.   NUTRITION DIAGNOSIS:   Inadequate oral intake related to lethargy/confusion, dysphagia as evidenced by  (varied meal completion 5-85%); improved  GOAL:   Patient will meet greater than or equal to 90% of their needs; met  MONITOR:   PO intake, Supplement acceptance, Weight trends, Labs, I & O's, Diet advancement, Skin  REASON FOR ASSESSMENT:   Low Braden    ASSESSMENT:   60 y.o. 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 She was admitted on 06/29/15 with acute onset of left sided weakness with slurred speech and left neglect.   Meal completion has been 50-100%. Pt currently has Prostat ordered and has been consuming them. RD to continue with current orders. Pt encouraged to eat her food at meals and to consume her supplements.   Labs and medications reviewed.   Diet Order:  DIET DYS 3 Room service appropriate?: Yes; Fluid consistency:: Nectar Thick  Skin:  Reviewed, no issues  Last BM:  3/5  Height:   Ht Readings from Last 1 Encounters:  07/07/15 '5\' 1"'  (1.549 m)    Weight:   Wt Readings from Last 1 Encounters:  07/28/15 176 lb 14 oz (80.23 kg)    Ideal Body Weight:  47.7 kg  BMI:  Body mass index is 33.44 kg/(m^2).  Estimated Nutritional Needs:   Kcal:  3709-6438  Protein:  85-100 grams  Fluid:  1.7 - 1.9 L/day  EDUCATION NEEDS:   No education needs identified at this time  Corrin Parker, MS, RD, LDN Pager # [phone] After hours/ weekend pager # [phone]

## 2015-07-28 NOTE — Progress Notes (Signed)
Speech Language Pathology Daily Session Note  Patient Details  Name: Becky Patterson MRN: [id number] Date of Birth: 03/30/1956  Today's Date: 07/28/2015 SLP Individual Time: 0802-0902 SLP Individual Time Calculation (min): 60 min  Short Term Goals: Week 3: SLP Short Term Goal 1 (Week 3): Pt will attend to the left in >75% of observable opportunities with min assist verbal cues.   SLP Short Term Goal 2 (Week 3): Pt will sustain her attention to basic familiar tasks for 15 minute intervals with mod assist verbal cues for redirection.  SLP Short Term Goal 3 (Week 3): Pt will consume dys 3 textures and nectar thick liquids with supervision cues for use of swallowing precautions.   SLP Short Term Goal 4 (Week 3): Pt willl consume trials of water via cup sips as part of the water protocol with minimal overt s/s of aspiration for 3 consecutive sessions with supervision cues for use of swallowing precautions.  SLP Short Term Goal 5 (Week 3): Pt will identify at least 2 deficits occurring s/p CVA with mod question cues.    Skilled Therapeutic Interventions:  Pt was seen for skilled ST targeting goals for dysphagia and cognition.  Pt required min assist verbal cues for rate and portion control when consuming presentations of her currently prescribed diet.  Large, mixed boluses elicited wet vocal quality which was mitigated with self initiated throat clear.  Small boluses were tolerated well without overt s/s of aspiration.  SLP also facilitated the session with a novel board game targeting sustained attention to task and visual scanning to the left of midline.  Pt initially required max assist verbal and visual cues to locate items to the left of midline; however, as task progressed she was able to locate items with min assist verbal cues.  Pt sustained her attention to task for ~3 minute intervals with min verbal cues for initiation and redirection to task.  Pt left in bed with call bell left within reach and  bed alarm set.  Continue per current plan of care.    Function:  Eating Eating   Modified Consistency Diet: Yes Eating Assist Level: Supervision or verbal cues;Set up assist for   Eating Set Up Assist For: Opening containers       Cognition Comprehension Comprehension assist level: Understands basic 75 - 89% of the time/ requires cueing 10 - 24% of the time  Expression   Expression assist level: Expresses basic 75 - 89% of the time/requires cueing 10 - 24% of the time. Needs helper to occlude trach/needs to repeat words.  Social Interaction Social Interaction assist level: Interacts appropriately 75 - 89% of the time - Needs redirection for appropriate language or to initiate interaction.  Problem Solving Problem solving assist level: Solves basic 25 - 49% of the time - needs direction more than half the time to initiate, plan or complete simple activities  Memory Memory assist level: Recognizes or recalls 50 - 74% of the time/requires cueing 25 - 49% of the time    Pain Pain Assessment Pain Assessment: No/denies pain  Therapy/Group: Individual Therapy  Lonnetta Kniskern, Selinda Orion 07/28/2015, 12:16 PM

## 2015-07-28 NOTE — Progress Notes (Signed)
Occupational Therapy Session Note  Patient Details  Name: Becky Patterson MRN: [id number] Date of Birth: 12-25-1955  Today's Date: 07/28/2015 OT Individual Time: 0945-1100 OT Individual Time Calculation (min): 75 min    Short Term Goals: Week 1:  OT Short Term Goal 1 (Week 1): Pt will maintain static sitting balance EOB for 2 mins with close supervision in preparation for selfcare tasks.  OT Short Term Goal 1 - Progress (Week 1): Not met OT Short Term Goal 2 (Week 1): Pt will complete UB bathing and dressing sitting supported with min assist. OT Short Term Goal 2 - Progress (Week 1): Not met OT Short Term Goal 3 (Week 1): Pt will transfer squat pivot to the drop arm commode with max assist of 1 person.  OT Short Term Goal 3 - Progress (Week 1): Not met OT Short Term Goal 4 (Week 1): Pt will locate all grooming and bathing items placed left of midline with no more than mod instructional cueing. OT Short Term Goal 4 - Progress (Week 1): Not met OT Short Term Goal 5 (Week 1): Pt's spouse will return demonstrate safe assist with LUE PROM exercises following handout.   OT Short Term Goal 5 - Progress (Week 1): Met Week 2:  OT Short Term Goal 1 (Week 2): Pt will maintain static sitting balance EOB for 2 mins with close supervision in preparation for selfcare tasks.  OT Short Term Goal 1 - Progress (Week 2): Met OT Short Term Goal 2 (Week 2): Pt will complete UB bathing and dressing sitting supported with min assist. OT Short Term Goal 2 - Progress (Week 2): Met OT Short Term Goal 3 (Week 2): Pt will transfer squat pivot to the drop arm commode with max assist of 1 person.  OT Short Term Goal 3 - Progress (Week 2): Progressing toward goal OT Short Term Goal 4 (Week 2): Pt will locate all grooming and bathing items placed left of midline with no more than mod instructional cueing. OT Short Term Goal 4 - Progress (Week 2): Progressing toward goal OT Short Term Goal 5 (Week 2): Pt will use the LUE  during bathing sessions with max assist.   OT Short Term Goal 5 - Progress (Week 2): Not progressing Week 3:  OT Short Term Goal 1 (Week 3): Pt will transfer to drop arm BSC with max A x 1. OT Short Term Goal 2 (Week 3): Pt will don shirt with min A. OT Short Term Goal 3 (Week 3): Pt will bathe will mod A sitting on BSC.      Skilled Therapeutic Interventions/Progress Updates:    Pt seen for skilled OT to facilitate trunk control and postural awareness with ADL retraining. Pt received in bed stating she needed the bed pan. 2 helpers to position pan. Pt did actively assist with self cleansing all the areas she could reach.  Pants donned from bed with pt actively rolling with min A. Sat to EOB and worked on sitting balance with wt shift to R with manual facilitation to position hips. Pt was able to hold static balance with R arm support for a few minutes at a time. Slide board to R with bobath technique over back with mod A of 2 helpers as pt actively using R arm to pull self into chair. UB self care chair level, shirt not changed as still connected to PICC line. Mod cues throughout the session to fully scan to the left side to find items. Oral care  followed by water protocol. Pt adjusted in chair with slight recline and quick release belt on. Call light in reach. Minimal c/o pain in LUE, only with some movement.  Therapy Documentation Precautions:  Precautions Precautions: Fall Precaution Comments: Lt neglect , poor trunk control, decreased awareness Restrictions Weight Bearing Restrictions: No       Pain: Pain Assessment Pain Assessment: No/denies pain   ADL:  See Function Navigator for Current Functional Status.   Therapy/Group: Individual Therapy  [place] 07/28/2015, 12:52 PM

## 2015-07-28 NOTE — Progress Notes (Signed)
Social Work Patient ID: Becky Patterson, female   DOB: 1955-05-30, 60 y.o.   MRN: [id number] [place] with husband who reports family members have been to [facility] in [place] and they want this worker to send paperwork to see if they can take her. The facility is close to their home. Have sent information to them to evaluate pt.

## 2015-07-29 ENCOUNTER — Inpatient Hospital Stay (HOSPITAL_COMMUNITY): Payer: 59 | Admitting: Occupational Therapy

## 2015-07-29 ENCOUNTER — Inpatient Hospital Stay (HOSPITAL_COMMUNITY): Payer: 59 | Admitting: Physical Therapy

## 2015-07-29 ENCOUNTER — Inpatient Hospital Stay (HOSPITAL_COMMUNITY): Payer: 59 | Admitting: Speech Pathology

## 2015-07-29 NOTE — Progress Notes (Signed)
Occupational Therapy Session Note  Patient Details  Name: Becky Patterson MRN: [id number] Date of Birth: 02/05/1956  Today's Date: 07/29/2015 OT Individual Time: 0900-1000 OT Individual Time Calculation (min): 60 min    Short Term Goals: Week 2:  OT Short Term Goal 1 (Week 2): Pt will maintain static sitting balance EOB for 2 mins with close supervision in preparation for selfcare tasks.  OT Short Term Goal 1 - Progress (Week 2): Met OT Short Term Goal 2 (Week 2): Pt will complete UB bathing and dressing sitting supported with min assist. OT Short Term Goal 2 - Progress (Week 2): Met OT Short Term Goal 3 (Week 2): Pt will transfer squat pivot to the drop arm commode with max assist of 1 person.  OT Short Term Goal 3 - Progress (Week 2): Progressing toward goal OT Short Term Goal 4 (Week 2): Pt will locate all grooming and bathing items placed left of midline with no more than mod instructional cueing. OT Short Term Goal 4 - Progress (Week 2): Progressing toward goal OT Short Term Goal 5 (Week 2): Pt will use the LUE during bathing sessions with max assist.   OT Short Term Goal 5 - Progress (Week 2): Not progressing Week 3:  OT Short Term Goal 1 (Week 3): Pt will transfer to drop arm BSC with max A x 1. OT Short Term Goal 2 (Week 3): Pt will don shirt with min A. OT Short Term Goal 3 (Week 3): Pt will bathe will mod A sitting on BSC.  Skilled Therapeutic Interventions/Progress Updates:    1:1 co treat with RT for first portion of session. Self care retraining at bed level for voiding and LB dressing. Pt able to demonstrate rolling to left with bed rail with supervision with max instructional cues. Pt able to roll towards the right with min A with extra time and again with max instructional cues. Pt able to initiate shoulder protraction to help UB roll towards her right side. Pt able to come to EOB with max A; however required max instructional cues to come down onto right forearm and hold  position to decr pushing. Once came into upright position. Pt able to hold midline with min to mod A. Slide board transfer with therapist providing supportive sitting position next to pt on her left and pt in charge of hold her left UE. Pt able to initiation forward weight shift and transition towards her right with tactile and manual facilitation for transfer with RT in front and Ot next to patient (still a max +2 transfer). Pt able to don shirt with mod A in supportive sitting position with instructional/ direct cues.    Pt transferred into regular w/c with focus on maintaining sitting position in midline and attempts to propel self with right hand and foot.  Pt required max A to steer and max A for sustained attention to task as well as attention to midline (and not the right side) in a controlled minimally quiet environment. Once transitioned to the gym focus on visual attention to the left obtained numbered cards from upright surface on the left and then transitioning weight shift to the right and forward to aligned numbered paper on the mat in front of her and to the right. PT required more than reasonable amt of time to complete and mod A for controlled balance with weight shift forwards. Left with husband in family room.   Therapy Documentation Precautions:  Precautions Precautions: Fall Precaution Comments: Lt neglect ,  poor trunk control, decreased awareness Restrictions Weight Bearing Restrictions: No Pain:  c/o hemorrhoids in session   See Function Navigator for Current Functional Status.   Therapy/Group: Individual Therapy  [facility] 07/29/2015, 3:15 PM

## 2015-07-29 NOTE — Progress Notes (Signed)
Social Work Patient ID: Becky Patterson, female   DOB: 1956-04-02, 60 y.o.   MRN: [id number] Spoke with Beth-Admission Director at [facility] where pt and family would like her to go. She will look into pt's insurance coverage and Whether she will have a bed, aware discharge can be anytime this week. Beth to contact this worker to let know if can offer a bed.

## 2015-07-29 NOTE — Progress Notes (Signed)
Physical Therapy Note  Patient Details  Name: [name] MRN: [id number] Date of Birth: 01-Feb-1956 Today's Date: 07/29/2015    Time: 1330-1429 59 minutes  1:1 no c/o pain.  Supine to sit with max A for trunk control, limited by decreased attention and core strength. Seated balance with min-max A for donning shoes.  Transfer to w/c without cushion with max A, +2 for safety.  W/c propulsion with hemi technique with max A for sequencing, motor planning, attention to task and Lt attention. Pt still needs max A with all w/c mobility despite tactile and verbal cues.  Standing frame with reaching and head turning tasks with focus on wt bearing in LEs, Lt attention and neck/trunk control. Pt improves with repetition of task, improved visual tracking noted. Pt able to stand 15 mins, 7 mins.    Becky Patterson 07/29/2015, 2:30 PM

## 2015-07-29 NOTE — Progress Notes (Signed)
Subjective/Complaints: Some left shoulder pain with motion, none with rest ROS- No SOB/CP No abd pain, - N/V, no blood in stool, dark stools  Objective: Vital Signs: Blood pressure 105/56, pulse 88, temperature 98.8 F (37.1 C), temperature source Oral, resp. rate 18, height _0  (1.549 m), weight 75 kg (165 lb 5.5 oz), SpO2 100 %. Dg Swallowing Func-speech Pathology  07/15/2015  Objective Swallowing Evaluation: Type of Study: MBS-Modified Barium Swallow Study Patient Details Name: Becky Patterson MRN: [id number] Date of Birth: [birth year] Today's Date: 07/15/2015 Time: SLP Start Time (ACUTE ONLY): 1340 SLP Stop Time (ACUTE ONLY): 1410 SLP Time Calculation (min) (ACUTE ONLY): 30 min Past Medical History: Past Medical History Diagnosis Date . Lung mass  . Bacterial endocarditis  . Splenic infarction  . Epistaxis  Past Surgical History: Past Surgical History Procedure Laterality Date . Back surgery   . Tee without cardioversion N/A 06/24/2015   Procedure: TRANSESOPHAGEAL ECHOCARDIOGRAM (TEE);  Surgeon: Larey Dresser, MD;  Location: [facility];  Service: Cardiovascular;  Laterality: N/A; . Radiology with anesthesia N/A 06/29/2015   Procedure: RADIOLOGY WITH ANESTHESIA;  Surgeon: Luanne Bras, MD;  Location: [facility];  Service: Radiology;  Laterality: N/A; . Cholecystectomy   HPI: Becky Patterson is a 60 y.o. female history of infective endocarditis that was treated with discharge on 2/2, continuing on antibiotics at home, lung mass noted on that admission. Re-admitted with left sided weakness that progressed after admission. 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  Pt admitted to CIR on 07/07/15.  Tolerating Dys 3, honey thick liquids diet.  Repeat MBS ordered to objectively determine readiness to advance given improvements noted at bedside.  No Data Recorded Assessment / Plan / Recommendation CHL IP CLINICAL IMPRESSIONS 07/15/2015 Therapy Diagnosis Mild pharyngeal phase  dysphagia;Mild oral phase dysphagia Clinical Impression Pt presents with improved swallowing function and now presents with a mild oropharyngeal dysphagia with sensory>motor deficits.  Pt presents with left sided labial and lingual weakness which results in anterior loss of boluses, impaired mastication of solids, and prolonged posterior transit of boluses to the oropharynx.  Decreased pharyngeal sensation and base of tongue weakness resulted in poor bolus cohesion and premature spillage of materials into the pharynx with swallow response triggered at the vallecula with thickened liquids and solids, delayed to the pyriforms with thin liquids.  Delay in swallow initiation resulted in penetration of large boluses of nectar thick liquids and thin liquids.  Penetration of thin liquids was greater in amount and occurred deeper into the laryngeal vestibule, requiring increased cues for throat clear and extra swallows to clear.  Chin tuck ineffective largely due to cognitive deficits impacting pt's ability to successfully time strategy during the swallow.   Given pt's significant cognitive deficits, limited mobility, and persistent lethargy, recommend conservative advancement to Dys 3, nectar thick liquids with full supervision for use of swallowing precautions.  Pt would also benefit from implementation of the water protocol to continue working towards liquids advancement.  Prognosis for advancement good given progress made thus far with continued ST interventions for trials of advanced consistencies and use of compensatory swallowing strategies.   Impact on safety and function Mild aspiration risk     Prognosis 07/03/2015 Prognosis for Safe Diet Advancement Good Barriers to Reach Goals -- Barriers/Prognosis Comment -- CHL IP DIET RECOMMENDATION 07/15/2015 SLP Diet Recommendations Dysphagia 3 (Mech soft) solids;Nectar thick liquid Liquid Administration via Cup;No straw Medication Administration Crushed with puree  Compensations Slow rate;Small sips/bites;Lingual sweep for clearance  of pocketing Postural Changes Seated upright at 90 degrees            CHL IP ORAL PHASE 07/15/2015 Oral Phase Impaired Oral - Pudding Teaspoon -- Oral - Pudding Cup -- Oral - Honey Teaspoon -- Oral - Honey Cup Left anterior bolus loss;Delayed oral transit;Decreased bolus cohesion;Premature spillage;Lingual/palatal residue Oral - Nectar Teaspoon -- Oral - Nectar Cup Left anterior bolus loss;Delayed oral transit;Lingual/palatal residue;Decreased bolus cohesion;Premature spillage Oral - Nectar Straw Lingual/palatal residue;Decreased bolus cohesion;Premature spillage Oral - Thin Teaspoon -- Oral - Thin Cup Left anterior bolus loss;Lingual/palatal residue;Delayed oral transit;Decreased bolus cohesion;Premature spillage Oral - Thin Straw Lingual/palatal residue;Decreased bolus cohesion;Premature spillage Oral - Puree Lingual/palatal residue;Delayed oral transit;Premature spillage Oral - Mech Soft Lingual/palatal residue;Premature spillage;Delayed oral transit;Impaired mastication Oral - Regular -- Oral - Multi-Consistency -- Oral - Pill -- Oral Phase - Comment --  CHL IP PHARYNGEAL PHASE 07/15/2015 Pharyngeal Phase Impaired Pharyngeal- Pudding Teaspoon -- Pharyngeal -- Pharyngeal- Pudding Cup -- Pharyngeal -- Pharyngeal- Honey Teaspoon -- Pharyngeal -- Pharyngeal- Honey Cup Delayed swallow initiation-vallecula;Reduced tongue base retraction;Pharyngeal residue - valleculae Pharyngeal -- Pharyngeal- Nectar Teaspoon -- Pharyngeal -- Pharyngeal- Nectar Cup Delayed swallow initiation-vallecula;Reduced tongue base retraction;Pharyngeal residue - valleculae;Penetration/Aspiration during swallow Pharyngeal Material enters airway, remains ABOVE vocal cords then ejected out Pharyngeal- Nectar Straw -- Pharyngeal -- Pharyngeal- Thin Teaspoon -- Pharyngeal -- Pharyngeal- Thin Cup Delayed swallow initiation-pyriform sinuses;Reduced tongue base  retraction;Penetration/Aspiration during swallow;Pharyngeal residue - valleculae;Pharyngeal residue - pyriform Pharyngeal Material enters airway, remains ABOVE vocal cords and not ejected out Pharyngeal- Thin Straw Delayed swallow initiation-pyriform sinuses;Reduced tongue base retraction;Penetration/Aspiration during swallow;Pharyngeal residue - valleculae;Pharyngeal residue - pyriform Pharyngeal Material enters airway, remains ABOVE vocal cords and not ejected out Pharyngeal- Puree Delayed swallow initiation-vallecula;Reduced tongue base retraction Pharyngeal -- Pharyngeal- Mechanical Soft Delayed swallow initiation-vallecula;Reduced tongue base retraction Pharyngeal -- Pharyngeal- Regular -- Pharyngeal -- Pharyngeal- Multi-consistency -- Pharyngeal -- Pharyngeal- Pill -- Pharyngeal -- Pharyngeal Comment --  CHL IP CERVICAL ESOPHAGEAL PHASE 07/03/2015 Cervical Esophageal Phase WFL Pudding Teaspoon -- Pudding Cup -- Honey Teaspoon -- Honey Cup -- Nectar Teaspoon -- Nectar Cup -- Nectar Straw -- Thin Teaspoon -- Thin Cup -- Thin Straw -- Puree -- Mechanical Soft -- Regular -- Multi-consistency -- Pill -- Cervical Esophageal Comment -- No flowsheet data found. Page, Selinda Orion 07/15/2015, 4:17 PM              Results for orders placed or performed during the hospital encounter of 07/07/15 (from the past 72 hour(s))  Glucose, capillary     Status: Abnormal   Collection Time: 07/26/15 11:47 AM  Result Value Ref Range   Glucose-Capillary 113 (H) 65 - 99 mg/dL  Glucose, capillary     Status: Abnormal   Collection Time: 07/26/15  4:38 PM  Result Value Ref Range   Glucose-Capillary 128 (H) 65 - 99 mg/dL  Glucose, capillary     Status: Abnormal   Collection Time: 07/26/15  9:02 PM  Result Value Ref Range   Glucose-Capillary 132 (H) 65 - 99 mg/dL  Glucose, capillary     Status: Abnormal   Collection Time: 07/27/15  6:50 AM  Result Value Ref Range   Glucose-Capillary 110 (H) 65 - 99 mg/dL  Comprehensive  metabolic panel     Status: Abnormal   Collection Time: 07/27/15  8:29 AM  Result Value Ref Range   Sodium 136 135 - 145 mmol/L   Potassium 3.8 3.5 - 5.1 mmol/L   Chloride 98 (L) 101 - 111 mmol/L  CO2 26 22 - 32 mmol/L   Glucose, Bld 135 (H) 65 - 99 mg/dL   BUN 16 6 - 20 mg/dL   Creatinine, Ser 0.75 0.44 - 1.00 mg/dL   Calcium 9.6 8.9 - 10.3 mg/dL   Total Protein 7.6 6.5 - 8.1 g/dL   Albumin 2.9 (L) 3.5 - 5.0 g/dL   AST 31 15 - 41 U/L   ALT 21 14 - 54 U/L   Alkaline Phosphatase 103 38 - 126 U/L   Total Bilirubin 0.7 0.3 - 1.2 mg/dL   GFR calc non Af Amer >60 >60 mL/min   GFR calc Af Amer >60 >60 mL/min    Comment: (NOTE) The eGFR has been calculated using the CKD EPI equation. This calculation has not been validated in all clinical situations. eGFR's persistently <60 mL/min signify possible Chronic Kidney Disease.    Anion gap 12 5 - 15  Creatinine, serum     Status: None   Collection Time: 07/28/15  5:46 AM  Result Value Ref Range   Creatinine, Ser 0.77 0.44 - 1.00 mg/dL   GFR calc non Af Amer >60 >60 mL/min   GFR calc Af Amer >60 >60 mL/min    Comment: (NOTE) The eGFR has been calculated using the CKD EPI equation. This calculation has not been validated in all clinical situations. eGFR's persistently <60 mL/min signify possible Chronic Kidney Disease.      HEENT: normal Cardio: RRR and no murmur Resp: CTA B/L and unlabored GI: BS positive and NT, ND Extremity:  No Edema, no erythema in LUE, no vasomotor changes Skin:   Intact and Other no heel breakdown, no evidence of rash Neuro: Lethargic, Confused, Flat, Cranial Nerve Abnormalities Left CN central 7, Abnormal Sensory reduced pinch sensation in LUE and LLE, Abnormal Motor 0/5 LUE and LLE, Tone:  clonus in Left ankle, Dysarthric and Inattention, left field cut, increased Left hip adductor tone MAS 2- 3 in left biceps and finger flexors Musc/Skel:  Other no pain with LUE or LLE ROM, tenderness over the R trap. Left  heel cord tight as well as finger flexors Gen NAD   Assessment/Plan: 1. Functional deficits secondary to Right MCA infarct with Left HP, Left neglect, Left hemisensory deficits which require 3+ hours per day of interdisciplinary therapy in a comprehensive inpatient rehab setting. Physiatrist is providing close team supervision and 24 hour management of active medical problems listed below. Physiatrist and rehab team continue to assess barriers to discharge/monitor patient progress toward functional and medical goals. FIM: Function - Bathing Bathing activity did not occur: N/A (night bath? and wanted to complete other activitiy today) Position: Bed (bed LB, w/c UB) Body parts bathed by patient: Right arm, Chest, Abdomen, Front perineal area, Right upper leg Body parts bathed by helper: Left upper leg, Left lower leg, Right lower leg, Back, Buttocks, Left arm Bathing not applicable: Right arm, Left arm, Chest, Abdomen, Front perineal area, Back (Pt declined UB bathing) Assist Level: 2 helpers  Function- Upper Body Dressing/Undressing Upper body dressing/undressing activity did not occur: N/A (already dressed for the day) What is the patient wearing?: Pull over shirt/dress Pull over shirt/dress - Perfomed by patient: Thread/unthread right sleeve Pull over shirt/dress - Perfomed by helper: Put head through opening, Thread/unthread left sleeve, Pull shirt over trunk Assist Level: Touching or steadying assistance(Pt > 75%) (modA) Function - Lower Body Dressing/Undressing Lower body dressing/undressing activity did not occur: N/A (already dressed for the day) What is the patient wearing?: Pants, Non-skid slipper  socks Position: Bed Pants- Performed by patient: Thread/unthread right pants leg Pants- Performed by helper: Thread/unthread left pants leg, Pull pants up/down, Thread/unthread right pants leg Non-skid slipper socks- Performed by helper: Don/doff right sock, Don/doff left sock Socks -  Performed by helper: Don/doff right sock, Don/doff left sock Shoes - Performed by patient: Don/doff right shoe Shoes - Performed by helper: Don/doff right shoe, Don/doff left shoe, Fasten right, Fasten left Assist for footwear: Dependant Assist for lower body dressing: 2 Helpers  Function - Toileting Toileting activity did not occur: No continent bowel/bladder event Toileting steps completed by helper: Adjust clothing prior to toileting, Performs perineal hygiene, Adjust clothing after toileting Assist level: Two helpers  Function - Air cabin crew transfer activity did not occur: Safety/medical concerns Assist level to toilet: 2 helpers Assist level from toilet: 2 helpers  Function - Chair/bed transfer Chair/bed transfer method: Squat pivot Chair/bed transfer assist level: Maximal assist (Pt 25 - 49%/lift and lower) Chair/bed transfer assistive device: Armrests Mechanical lift: Maximove Chair/bed transfer details: Verbal cues for sequencing, Verbal cues for technique, Manual facilitation for weight shifting, Manual facilitation for placement, Manual facilitation for weight bearing  Function - Locomotion: Wheelchair Will patient use wheelchair at discharge?: Yes Type: Manual (TIS) Max wheelchair distance: 150 Assist Level: Dependent (Pt equals 0%) Assist Level: Dependent (Pt equals 0%) Assist Level: Dependent (Pt equals 0%) Function - Locomotion: Ambulation Ambulation activity did not occur: Safety/medical concerns  Function - Comprehension Comprehension: Auditory Comprehension assist level: Understands basic 75 - 89% of the time/ requires cueing 10 - 24% of the time  Function - Expression Expression: Verbal Expression assist level: Expresses basic 75 - 89% of the time/requires cueing 10 - 24% of the time. Needs helper to occlude trach/needs to repeat words.  Function - Social Interaction Social Interaction assist level: Interacts appropriately 75 - 89% of the time -  Needs redirection for appropriate language or to initiate interaction.  Function - Problem Solving Problem solving assist level: Solves basic 25 - 49% of the time - needs direction more than half the time to initiate, plan or complete simple activities  Function - Memory Memory assist level: Recognizes or recalls 50 - 74% of the time/requires cueing 25 - 49% of the time Patient normally able to recall (first 3 days only): Current season  Medical Problem List and Plan: 1.  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 No antithrombotic needed.Cont Abx thru 3/20,  2.  DVT Prophylaxis/Anticoagulation: Pharmaceutical: Lovenox,LE venous dopplers neg 3. Pain Management: Has chronic back pain per family. Will use tylenol prn for now    -LUE hypersensitivity add gabapentin '100mg'$  BID- monitor sedation  -LUE and LLE tone increasing cont  Zanaflex '2mg'$  TID  - 4. Mood: LCSW to follow for evaluation and support.   5. Neuropsych: This patient is not fully capable of making decisions on her own behalf. 6. Skin/Wound Care: routine pressure relief measures.   7. Fluids/Electrolytes/Nutrition: Monitor I/O.upgraded to D3 nectar, repeat CMET to check alb and total protein   ,Intake improved to 100%    Getting dehydrated with honey liquids.   IVF at nights and need to encourage fluid intake during the day. 8. 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 PET scan on outpatient basis. 10. Hypokalemia: K+ 3.3 2/26 will recheck in am increase KCL supplement,42mq , will repeat BMET 3/3 normal 11. Cytotoxic edema: Weaned of  hypertonic solution.    12. Endocarditis  Antibiotic-End date March 20th , now evidence that this has affected mitral valve causing regurg,elevated ESR she will cont ceftriaxone for her endocarditis for another week unless repeat cx show resistance  13.  Anemia: Hgb low but has stayed in 7.6-8.0 range, check stool guaic was pos 1/23, neg on 2/2 and 2/16, Hgb stable at 8.5 2/26, repeat Hgb 8.9 on 3/3   14. Hypotension: Cont to monitor- zanaflex may contribute 15. Headache:, Tramadol 144m Q6h prn,improved 16.  Bowel and bladder incont ~50% 17.  Severe mitral regurg,  Cardiology repeated ECHO with severe MR now noted, not good surgical candidate, discussed with Dr VPrescott [id number].  Insomnia improved without meds sleep log 7 hrs, 11p-6a LOS (Days) 22 A FACE TO FACE EVALUATION WAS PERFORMED  Kyonna Frier E 07/29/2015, 8:16 AM

## 2015-07-29 NOTE — Progress Notes (Signed)
Speech Language Pathology Weekly Progress and Session Note  Patient Details  Name: Becky Patterson MRN: [id number] Date of Birth: 02-02-1956  Beginning of progress report period:  July 22, 2015   End of progress report period: July 29, 2015   Today's Date: 07/29/2015 SLP Individual Time: 0802-0900 SLP Individual Time Calculation (min): 58 min  Short Term Goals: Week 3: SLP Short Term Goal 1 (Week 3): Pt will attend to the left in >75% of observable opportunities with min assist verbal cues.   SLP Short Term Goal 1 - Progress (Week 3): Progressing toward goal SLP Short Term Goal 2 (Week 3): Pt will sustain her attention to basic familiar tasks for 15 minute intervals with mod assist verbal cues for redirection.  SLP Short Term Goal 2 - Progress (Week 3): Progressing toward goal SLP Short Term Goal 3 (Week 3): Pt will consume dys 3 textures and nectar thick liquids with supervision cues for use of swallowing precautions.   SLP Short Term Goal 3 - Progress (Week 3): Progressing toward goal SLP Short Term Goal 4 (Week 3): Pt willl consume trials of water via cup sips as part of the water protocol with minimal overt s/s of aspiration for 3 consecutive sessions with supervision cues for use of swallowing precautions.  SLP Short Term Goal 4 - Progress (Week 3): Met (per water protocol log; thin liquids not attempted during therapies over 3 consecutive sessions due to pt being seen with meals. ) SLP Short Term Goal 5 (Week 3): Pt will identify at least 2 deficits occurring s/p CVA with mod question cues.   SLP Short Term Goal 5 - Progress (Week 3): Met    New Short Term Goals: Week 4: SLP Short Term Goal 1 (Week 4): Pt will attend to the left in >75% of observable opportunities with min assist verbal cues.   SLP Short Term Goal 2 (Week 4): Pt will sustain her attention to basic familiar tasks for 15 minute intervals with mod assist verbal cues for redirection.  SLP Short Term Goal 3 (Week 4):  Pt will consume dys 3 textures and nectar thick liquids with supervision cues for use of swallowing precautions.   SLP Short Term Goal 4 (Week 4): Pt willl consume trials thin liquids during meals with minimal overt s/s of aspiration for 3 consecutive sessions with supervision cues for use of swallowing precautions.  SLP Short Term Goal 5 (Week 4): Pt will identify at least 2 deficits occurring s/p CVA with min question cues.    Weekly Progress Updates:  Pt made slow, inconsistent gains this reporting period and has met 3 2 out of 5 short term goals.  Pt continues to require min assist verbal cues for use of swallowing precautions when consuming dys 3 textures and nectar thick liquids due to impulsivity with rate and portion control.  Pt is tolerating trials of thin liquids per the water protocol with minimal overt s/s of aspiration.  Recommend focusing on trials of thin liquids during meals for the next reporting period to continue working towards liquids advancement.  Pt also continues to require overall mod assist during basic familiar tasks; however, pt can fluctuate between max assist and supervision depending on fatigue and medication effects.  Pt and family education is ongoing.  Pt would continue to benefit from skilled ST while inpatient in order to maximize functional independence and reduce burden of care prior to discharge.  Pt is now pending SNF placement.     Intensity: Minumum  of 1-2 x/day, 30 to 90 minutes Frequency: 3 to 5 out of 7 days Duration/Length of Stay: 21-28 days  Treatment/Interventions: Cognitive remediation/compensation;Cueing hierarchy;Functional tasks;Environmental controls;Dysphagia/aspiration precaution training;Internal/external aids;Patient/family education   Daily Session  Skilled Therapeutic Interventions:  Pt was seen for skilled ST targeting cognitive goals.  Pt had finished breakfast upon arrival and sister, [name], was helping pt complete oral care to remove a  mild amount of residual solids from the oral cavity.  SLP facilitated the session with a novel visual attention and memory task to facilitate visual scanning and sustained attention to task.  Pt was noted with increased impulsivity and decreased task organization/working memory of task procedures in comparison to previous therapy sessions and as a result required up to max assist to complete task for ~50% accuracy.  SLP reviewed and reinforced strategies for task organization, including completion of only one step at a time before moving on to next step.  Pt verbalized understanding.  Pt was left in bed with husband at bedside.  Goals updated on this date to reflect current progress and plan of care.      Function:   Eating Eating                 Cognition Comprehension Comprehension assist level: Understands basic 75 - 89% of the time/ requires cueing 10 - 24% of the time  Expression   Expression assist level: Expresses basic 75 - 89% of the time/requires cueing 10 - 24% of the time. Needs helper to occlude trach/needs to repeat words.  Social Interaction Social Interaction assist level: Interacts appropriately 75 - 89% of the time - Needs redirection for appropriate language or to initiate interaction.  Problem Solving Problem solving assist level: Solves basic 25 - 49% of the time - needs direction more than half the time to initiate, plan or complete simple activities  Memory Memory assist level: Recognizes or recalls 50 - 74% of the time/requires cueing 25 - 49% of the time   General    Pain Pain Assessment Pain Assessment: No/denies pain   Therapy/Group: Individual Therapy  Avriel Kandel, Selinda Orion 07/29/2015, 10:47 AM

## 2015-07-29 NOTE — Progress Notes (Signed)
Speech Language Pathology Daily Session Note  Patient Details  Name: Becky Patterson MRN: [id number] Date of Birth: [birth year]  Today's Date: 07/29/2015 SLP Individual Time: 4128-7867 SLP Individual Time Calculation (min): 34 min  Short Term Goals: Week 4: SLP Short Term Goal 1 (Week 4): Pt will attend to the left in >75% of observable opportunities with min assist verbal cues.   SLP Short Term Goal 2 (Week 4): Pt will sustain her attention to basic familiar tasks for 15 minute intervals with mod assist verbal cues for redirection.  SLP Short Term Goal 3 (Week 4): Pt will consume dys 3 textures and nectar thick liquids with supervision cues for use of swallowing precautions.   SLP Short Term Goal 4 (Week 4): Pt willl consume trials thin liquids during meals with minimal overt s/s of aspiration for 3 consecutive sessions with supervision cues for use of swallowing precautions.  SLP Short Term Goal 5 (Week 4): Pt will identify at least 2 deficits occurring s/p CVA with min question cues.    Skilled Therapeutic Interventions: Pt seen for cognitive and dysphagia therapy. Focus on trials of thin with regular. Pt able to manage thins via small cup sips with only occasional throat clear. Voicing remained clear throughout. Complete oral clearance achieved at mod I level. Occasional verbal cueing to decrease sip size and complete swallow after throat clear. Consistent verbal cueing required to complete visual sweep to locate items on the L. With min-mod verbal cueing by able to locate items with 90% acc. Consider trialing cup sips of water only for liquids moving forward versus nectar-thick.   Function:  Eating Eating   Modified Consistency Diet: No Eating Assist Level: Supervision or verbal cues;Set up assist for   Eating Set Up Assist For: Opening containers       Cognition Comprehension Comprehension assist level: Understands basic 75 - 89% of the time/ requires cueing 10 - 24% of the time   Expression   Expression assist level: Expresses basic 75 - 89% of the time/requires cueing 10 - 24% of the time. Needs helper to occlude trach/needs to repeat words.  Social Interaction Social Interaction assist level: Interacts appropriately 75 - 89% of the time - Needs redirection for appropriate language or to initiate interaction.  Problem Solving Problem solving assist level: Solves basic 25 - 49% of the time - needs direction more than half the time to initiate, plan or complete simple activities  Memory Memory assist level: Recognizes or recalls 50 - 74% of the time/requires cueing 25 - 49% of the time    Pain Pain Assessment Pain Assessment: No/denies pain  Therapy/Group: Individual Therapy  Vinetta Bergamo 07/29/2015, 4:25 PM

## 2015-07-30 ENCOUNTER — Inpatient Hospital Stay (HOSPITAL_COMMUNITY): Payer: 59 | Admitting: Speech Pathology

## 2015-07-30 ENCOUNTER — Inpatient Hospital Stay (HOSPITAL_COMMUNITY): Payer: 59 | Admitting: Occupational Therapy

## 2015-07-30 ENCOUNTER — Inpatient Hospital Stay (HOSPITAL_COMMUNITY): Payer: 59 | Admitting: Physical Therapy

## 2015-07-30 NOTE — Progress Notes (Signed)
Subjective/Complaints: Slept well last night Sister states patient complained of less pain in the left upper extremity ROS- No SOB/CP No abd pain, - N/V,   Objective: Vital Signs: Blood pressure 107/67, pulse 93, temperature 99.6 F (37.6 C), temperature source Oral, resp. rate 18, height '5\' 1"'  (1.549 m), weight 79 kg (174 lb 2.6 oz), SpO2 94 %. Dg Swallowing Func-speech Pathology  07/15/2015  Objective Swallowing Evaluation: Type of Study: MBS-Modified Barium Swallow Study Patient Details Name: Becky Patterson MRN: [id number] Date of Birth: 1955-12-23 Today's Date: 07/15/2015 Time: SLP Start Time (ACUTE ONLY): 1340 SLP Stop Time (ACUTE ONLY): 1410 SLP Time Calculation (min) (ACUTE ONLY): 30 min Past Medical History: Past Medical History Diagnosis Date . Lung mass  . Bacterial endocarditis  . Splenic infarction  . Epistaxis  Past Surgical History: Past Surgical History Procedure Laterality Date . Back surgery   . Tee without cardioversion N/A 06/24/2015   Procedure: TRANSESOPHAGEAL ECHOCARDIOGRAM (TEE);  Surgeon: Becky Dresser, MD;  Location: [facility];  Service: Cardiovascular;  Laterality: N/A; . Radiology with anesthesia N/A 06/29/2015   Procedure: RADIOLOGY WITH ANESTHESIA;  Surgeon: Becky Bras, MD;  Location: [facility];  Service: Radiology;  Laterality: N/A; . Cholecystectomy   HPI: Becky Patterson is a 60 y.o. female history of infective endocarditis that was treated with discharge on 2/2, continuing on antibiotics at home, lung mass noted on that admission. Re-admitted with left sided weakness that progressed after admission. 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  Pt admitted to CIR on 07/07/15.  Tolerating Dys 3, honey thick liquids diet.  Repeat MBS ordered to objectively determine readiness to advance given improvements noted at bedside.  No Data Recorded Assessment / Plan / Recommendation CHL IP CLINICAL IMPRESSIONS 07/15/2015 Therapy Diagnosis Mild pharyngeal  phase dysphagia;Mild oral phase dysphagia Clinical Impression Pt presents with improved swallowing function and now presents with a mild oropharyngeal dysphagia with sensory>motor deficits.  Pt presents with left sided labial and lingual weakness which results in anterior loss of boluses, impaired mastication of solids, and prolonged posterior transit of boluses to the oropharynx.  Decreased pharyngeal sensation and base of tongue weakness resulted in poor bolus cohesion and premature spillage of materials into the pharynx with swallow response triggered at the vallecula with thickened liquids and solids, delayed to the pyriforms with thin liquids.  Delay in swallow initiation resulted in penetration of large boluses of nectar thick liquids and thin liquids.  Penetration of thin liquids was greater in amount and occurred deeper into the laryngeal vestibule, requiring increased cues for throat clear and extra swallows to clear.  Chin tuck ineffective largely due to cognitive deficits impacting pt's ability to successfully time strategy during the swallow.   Given pt's significant cognitive deficits, limited mobility, and persistent lethargy, recommend conservative advancement to Dys 3, nectar thick liquids with full supervision for use of swallowing precautions.  Pt would also benefit from implementation of the water protocol to continue working towards liquids advancement.  Prognosis for advancement good given progress made thus far with continued ST interventions for trials of advanced consistencies and use of compensatory swallowing strategies.   Impact on safety and function Mild aspiration risk     Prognosis 07/03/2015 Prognosis for Safe Diet Advancement Good Barriers to Reach Goals -- Barriers/Prognosis Comment -- CHL IP DIET RECOMMENDATION 07/15/2015 SLP Diet Recommendations Dysphagia 3 (Mech soft) solids;Nectar thick liquid Liquid Administration via Cup;No straw Medication Administration Crushed with puree  Compensations Slow rate;Small sips/bites;Lingual sweep  for clearance of pocketing Postural Changes Seated upright at 90 degrees            CHL IP ORAL PHASE 07/15/2015 Oral Phase Impaired Oral - Pudding Teaspoon -- Oral - Pudding Cup -- Oral - Honey Teaspoon -- Oral - Honey Cup Left anterior bolus loss;Delayed oral transit;Decreased bolus cohesion;Premature spillage;Lingual/palatal residue Oral - Nectar Teaspoon -- Oral - Nectar Cup Left anterior bolus loss;Delayed oral transit;Lingual/palatal residue;Decreased bolus cohesion;Premature spillage Oral - Nectar Straw Lingual/palatal residue;Decreased bolus cohesion;Premature spillage Oral - Thin Teaspoon -- Oral - Thin Cup Left anterior bolus loss;Lingual/palatal residue;Delayed oral transit;Decreased bolus cohesion;Premature spillage Oral - Thin Straw Lingual/palatal residue;Decreased bolus cohesion;Premature spillage Oral - Puree Lingual/palatal residue;Delayed oral transit;Premature spillage Oral - Mech Soft Lingual/palatal residue;Premature spillage;Delayed oral transit;Impaired mastication Oral - Regular -- Oral - Multi-Consistency -- Oral - Pill -- Oral Phase - Comment --  CHL IP PHARYNGEAL PHASE 07/15/2015 Pharyngeal Phase Impaired Pharyngeal- Pudding Teaspoon -- Pharyngeal -- Pharyngeal- Pudding Cup -- Pharyngeal -- Pharyngeal- Honey Teaspoon -- Pharyngeal -- Pharyngeal- Honey Cup Delayed swallow initiation-vallecula;Reduced tongue base retraction;Pharyngeal residue - valleculae Pharyngeal -- Pharyngeal- Nectar Teaspoon -- Pharyngeal -- Pharyngeal- Nectar Cup Delayed swallow initiation-vallecula;Reduced tongue base retraction;Pharyngeal residue - valleculae;Penetration/Aspiration during swallow Pharyngeal Material enters airway, remains ABOVE vocal cords then ejected out Pharyngeal- Nectar Straw -- Pharyngeal -- Pharyngeal- Thin Teaspoon -- Pharyngeal -- Pharyngeal- Thin Cup Delayed swallow initiation-pyriform sinuses;Reduced tongue base  retraction;Penetration/Aspiration during swallow;Pharyngeal residue - valleculae;Pharyngeal residue - pyriform Pharyngeal Material enters airway, remains ABOVE vocal cords and not ejected out Pharyngeal- Thin Straw Delayed swallow initiation-pyriform sinuses;Reduced tongue base retraction;Penetration/Aspiration during swallow;Pharyngeal residue - valleculae;Pharyngeal residue - pyriform Pharyngeal Material enters airway, remains ABOVE vocal cords and not ejected out Pharyngeal- Puree Delayed swallow initiation-vallecula;Reduced tongue base retraction Pharyngeal -- Pharyngeal- Mechanical Soft Delayed swallow initiation-vallecula;Reduced tongue base retraction Pharyngeal -- Pharyngeal- Regular -- Pharyngeal -- Pharyngeal- Multi-consistency -- Pharyngeal -- Pharyngeal- Pill -- Pharyngeal -- Pharyngeal Comment --  CHL IP CERVICAL ESOPHAGEAL PHASE 07/03/2015 Cervical Esophageal Phase WFL Pudding Teaspoon -- Pudding Cup -- Honey Teaspoon -- Honey Cup -- Nectar Teaspoon -- Nectar Cup -- Nectar Straw -- Thin Teaspoon -- Thin Cup -- Thin Straw -- Puree -- Mechanical Soft -- Regular -- Multi-consistency -- Pill -- Cervical Esophageal Comment -- No flowsheet data found. Page, Selinda Orion 07/15/2015, 4:17 PM              Results for orders placed or performed during the hospital encounter of 07/07/15 (from the past 72 hour(s))  Creatinine, serum     Status: None   Collection Time: 07/28/15  5:46 AM  Result Value Ref Range   Creatinine, Ser 0.77 0.44 - 1.00 mg/dL   GFR calc non Af Amer >60 >60 mL/min   GFR calc Af Amer >60 >60 mL/min    Comment: (NOTE) The eGFR has been calculated using the CKD EPI equation. This calculation has not been validated in all clinical situations. eGFR's persistently <60 mL/min signify possible Chronic Kidney Disease.      HEENT: normal Cardio: RRR and no murmur Resp: CTA B/L and unlabored GI: BS positive and NT, ND Extremity:  No Edema, no erythema in LUE, no vasomotor changes Skin:    Intact and Other no heel breakdown, no evidence of rash Neuro: Lethargic, Confused, Flat, Cranial Nerve Abnormalities Left CN central 7, Abnormal Sensory reduced pinch sensation in LUE and LLE, Abnormal Motor 0/5 LUE and LLE, Tone:  clonus in Left ankle, Dysarthric and Inattention, left field cut,  increased Left hip adductor tone MAS 2- 3 in left biceps and finger flexors Musc/Skel:  Other no pain with LUE or LLE ROM, tenderness over the R trap. Left heel cord tight as well as finger flexors Gen NAD   Assessment/Plan: 1. Functional deficits secondary to Right MCA infarct with Left HP, Left neglect, Left hemisensory deficits which require 3+ hours per day of interdisciplinary therapy in a comprehensive inpatient rehab setting. Physiatrist is providing close team supervision and 24 hour management of active medical problems listed below. Physiatrist and rehab team continue to assess barriers to discharge/monitor patient progress toward functional and medical goals. FIM: Function - Bathing Bathing activity did not occur: N/A (night bath? and wanted to complete other activitiy today) Position: Bed (bed LB, w/c UB) Body parts bathed by patient: Right arm, Chest, Abdomen, Front perineal area, Right upper leg Body parts bathed by helper: Left upper leg, Left lower leg, Right lower leg, Back, Buttocks, Left arm Bathing not applicable: Right arm, Left arm, Chest, Abdomen, Front perineal area, Back (Pt declined UB bathing) Assist Level: 2 helpers  Function- Upper Body Dressing/Undressing Upper body dressing/undressing activity did not occur: N/A (already dressed for the day) What is the patient wearing?: Pull over shirt/dress Pull over shirt/dress - Perfomed by patient: Thread/unthread right sleeve Pull over shirt/dress - Perfomed by helper: Put head through opening, Thread/unthread left sleeve, Pull shirt over trunk Assist Level: Touching or steadying assistance(Pt > 75%) (modA) Function - Lower  Body Dressing/Undressing Lower body dressing/undressing activity did not occur: N/A (already dressed for the day) What is the patient wearing?: Pants, Non-skid slipper socks Position: Bed Pants- Performed by patient: Thread/unthread right pants leg Pants- Performed by helper: Thread/unthread left pants leg, Pull pants up/down, Thread/unthread right pants leg Non-skid slipper socks- Performed by helper: Don/doff right sock, Don/doff left sock Socks - Performed by helper: Don/doff right sock, Don/doff left sock Shoes - Performed by patient: Don/doff right shoe Shoes - Performed by helper: Don/doff right shoe, Don/doff left shoe, Fasten right, Fasten left Assist for footwear: Dependant Assist for lower body dressing: 2 Helpers  Function - Toileting Toileting activity did not occur: No continent bowel/bladder event Toileting steps completed by helper: Adjust clothing prior to toileting, Performs perineal hygiene, Adjust clothing after toileting Assist level: Two helpers  Function - Air cabin crew transfer activity did not occur: Safety/medical concerns Assist level to toilet: 2 helpers Assist level from toilet: 2 helpers  Function - Chair/bed transfer Chair/bed transfer method: Squat pivot Chair/bed transfer assist level: Maximal assist (Pt 25 - 49%/lift and lower) Chair/bed transfer assistive device: Armrests Mechanical lift: Maximove Chair/bed transfer details: Verbal cues for sequencing, Verbal cues for technique, Manual facilitation for weight shifting, Manual facilitation for placement, Manual facilitation for weight bearing  Function - Locomotion: Wheelchair Will patient use wheelchair at discharge?: Yes Type: Manual (TIS) Max wheelchair distance: 150 Assist Level: Dependent (Pt equals 0%) Assist Level: Dependent (Pt equals 0%) Assist Level: Dependent (Pt equals 0%) Function - Locomotion: Ambulation Ambulation activity did not occur: Safety/medical  concerns  Function - Comprehension Comprehension: Auditory Comprehension assist level: Understands basic 75 - 89% of the time/ requires cueing 10 - 24% of the time  Function - Expression Expression: Verbal Expression assist level: Expresses basic 75 - 89% of the time/requires cueing 10 - 24% of the time. Needs helper to occlude trach/needs to repeat words.  Function - Social Interaction Social Interaction assist level: Interacts appropriately 75 - 89% of the time - Needs redirection for  appropriate language or to initiate interaction.  Function - Problem Solving Problem solving assist level: Solves basic 25 - 49% of the time - needs direction more than half the time to initiate, plan or complete simple activities  Function - Memory Memory assist level: Recognizes or recalls 50 - 74% of the time/requires cueing 25 - 49% of the time Patient normally able to recall (first 3 days only): Current season  Medical Problem List and Plan: 1.  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 No antithrombotic needed.Cont Abx thru 3/20, Team conference today please see physician documentation under team conference tab, met with team face-to-face to discuss problems,progress, and goals. Formulized individual treatment plan based on medical history, underlying problem and comorbidities. 2.  DVT Prophylaxis/Anticoagulation: Pharmaceutical: Lovenox,LE venous dopplers neg 3. Pain Management: Has chronic back pain per family. Will use tylenol prn for now According to sister left upper shoulder pain improved on medications    -LUE hypersensitivity add gabapentin 139m BID- monitor sedation  -LUE and LLE tone increasing cont  Zanaflex 227mTID  - 4. Mood: LCSW to follow for evaluation and support.   5. Neuropsych: This patient is not fully capable of making decisions on her own behalf. 6. Skin/Wound Care: routine pressure relief measures.   7.  Fluids/Electrolytes/Nutrition: Monitor I/O.upgraded to D3 nectar, repeat CMET to check alb and total protein   ,Intake improved to 100%    Getting dehydrated with honey liquids.   IVF at nights and need to encourage fluid intake during the day. 8. 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 PET scan on outpatient basis. 10. Hypokalemia: K+ 3.3 2/26 will recheck in am increase KCL supplement,2098m, will repeat BMET 3/3 normal 11. Cytotoxic edema: Weaned of hypertonic solution.    12. Endocarditis  Antibiotic-End date March 20th , now evidence that this has affected mitral valve causing regurg,elevated ESR she will cont ceftriaxone for her endocarditis for another week unless repeat cx show resistance  13. Anemia: Hgb low but has stayed in 7.6-8.0 range, check stool guaic was pos 1/23, neg on 2/2 and 2/16, Hgb stable at 8.5 2/26, repeat Hgb 8.9 on 3/3   14. Hypotension: Cont to monitor- No increased hypertension on Zanaflex 15. Headache:, Tramadol 100m10mh prn,improved 16.  Bowel and bladder incont ~50% 17.  Severe mitral regurg,  Cardiology repeated ECHO with severe MR now noted, not good surgical candidate, discussed with Dr Van Lucianne Leigt  LOS (Days) 23 A FACE TO FACENew Cordell/12/2015, 9:32 AM

## 2015-07-30 NOTE — Patient Care Conference (Signed)
Inpatient RehabilitationTeam Conference and Plan of Care Update Date: 07/30/2015   Time: 10:25 AM    Patient Name: Becky Patterson      Medical Record Number: [id number]  Date of Birth: [birth year] Sex: Female         Room/Bed: 4W18C/4W18C-01 Payor Info: Payor: [employer] / Plan: [employer] OTHER / Product Type: *No Product type* /    Admitting Diagnosis: RT CVA  Admit Date/Time:  07/07/2015  3:54 PM Admission Comments: No comment available   Primary Diagnosis:  <principal problem not specified> Principal Problem: <principal problem not specified>  Patient Active Problem List   Diagnosis Date Noted  . Sepsis with hypotension ([place]) 07/31/2015  . Hyperglycemia 07/31/2015  . Severe mitral regurgitation   . Cerebrovascular accident (CVA) due to embolism of right middle cerebral artery ([facility])   . 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 Acute blood loss anemia   . 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 Left hemiparesis ([place]) 07/03/2015  . 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 Visual field cut 07/03/2015  . Palliative care encounter   . DNR (do not resuscitate) discussion   . Acute respiratory failure with hypoxia ([place]) 06/30/2015  . Colitis   . Mass of lower lobe of left lung   . Tobacco abuse   . Anemia of chronic disease     Expected Discharge Date: Expected Discharge Date: 08/02/15  Team Members Present: Physician leading conference: Dr. Alysia Penna Social Worker Present: Ovidio Kin, LCSW Nurse Present: Heather Roberts, RN PT Present: Jorge Mandril, PT OT Present: Clyda Greener, OT SLP Present: Windell Moulding, SLP PPS Coordinator present : Daiva Nakayama, RN, CRRN     Current Status/Progress Goal Weekly Team Focus  Medical   no sig Left sided improvement  SNF  IV Abx through 3/20   Bowel/Bladder   Continent of bowel and bladder. LBM 07/29/15  Pt to remain continent of bowel and bladder  Encourage timed  toileting   Swallow/Nutrition/ Hydration   Min assist for swallowing precautions, initated trials of regular/thins with SLP only   supervision with least restrictive diet   trails of regular/thins to work towards diet advancement    ADL's             Mobility   fluctuates between total A and max A for transfers, min-max A sitting balance  mod A transfers, min A sitting balance  attention, balance, transfers   Communication             Safety/Cognition/ Behavioral Observations  limited progress this reporting period due to fluctuations in mentation, continues at overall mod assist    min assist for basic   continue to address basic    Pain   Tylenol '650mg'$  q 4hr prn, Ultram '100mg'$  q 6hr prn  <3  assess pain q 4hr and prn   Skin   bottom pink but blanchable, foam to sacrum  Turn q 2hrs  assess skin q shift and prn      *See Care Plan and progress notes for long and short-term goals.  Barriers to Discharge: heavy assist    Possible Resolutions to Barriers:  Cont rehab until SNF placement    Discharge Planning/Teaching Needs:  Plan is now NHP due to amount of care pt requires at this time. Family is toruing facilities and I am working on obtaining a NH bed      Team Discussion:  Slow gain in therapies,  more alert and less shoulder pain. Trials of thin liquids to try to advance her diet. Fluctuates with how much assist she requires-between max-total. Cont B & B. Needs IV antibiotics until 3/20 has PICC line.  Revisions to Treatment Plan:  Plan changed to NHP    Continued Need for Acute Rehabilitation Level of Care: The patient requires daily medical management by a physician with specialized training in physical medicine and rehabilitation for the following conditions: Daily direction of a multidisciplinary physical rehabilitation program to ensure safe treatment while eliciting the highest outcome that is of practical value to the patient.: Yes Daily medical management of patient  stability for increased activity during participation in an intensive rehabilitation regime.: Yes Daily analysis of laboratory values and/or radiology reports with any subsequent need for medication adjustment of medical intervention for : Neurological problems;Other  Lynessa Almanzar, Gardiner Rhyme 07/31/2015, 8:45 AM

## 2015-07-30 NOTE — Progress Notes (Signed)
Speech Language Pathology Daily Session Note  Patient Details  Name: Becky Patterson MRN: [id number] Date of Birth: 1955/12/06  Today's Date: 07/30/2015 SLP Individual Time: 1003-1030 SLP Individual Time Calculation (min): 27 min  Short Term Goals: Week 4: SLP Short Term Goal 1 (Week 4): Pt will attend to the left in >75% of observable opportunities with min assist verbal cues.   SLP Short Term Goal 2 (Week 4): Pt will sustain her attention to basic familiar tasks for 15 minute intervals with mod assist verbal cues for redirection.  SLP Short Term Goal 3 (Week 4): Pt will consume dys 3 textures and nectar thick liquids with supervision cues for use of swallowing precautions.   SLP Short Term Goal 4 (Week 4): Pt willl consume trials thin liquids during meals with minimal overt s/s of aspiration for 3 consecutive sessions with supervision cues for use of swallowing precautions.  SLP Short Term Goal 5 (Week 4): Pt will identify at least 2 deficits occurring s/p CVA with min question cues.    Skilled Therapeutic Interventions:  Pt was seen for skilled ST targeting cognitive goals.  SLP facilitated the session with trials of solid cracker consistencies with thin liquids to continue working towards diet progression.  Pt demonstrated cough x1 over >8 oz of thin liquids which SLP suspects to be related to impaired timing of swallow with quick inhalation.  No other overt s/s of aspiration with solids or liquids and pt utilized rate and portion control with overall supervision verbal cues from therapist and pt's husband.  Recommend trials of regular/thin with SLP only over the next 1-2 days prior to advancement to reinforce consistent use of swallowing precautions and monitor toleration.  Pt left upright in wheelchair with husband and bedside.  Continue per current plan of care.    Function:  Eating Eating   Modified Consistency Diet: Yes Eating Assist Level: Supervision or verbal cues;Set up assist  for   Eating Set Up Assist For: Opening containers       Cognition Comprehension Comprehension assist level: Understands basic 75 - 89% of the time/ requires cueing 10 - 24% of the time  Expression   Expression assist level: Expresses basic 75 - 89% of the time/requires cueing 10 - 24% of the time. Needs helper to occlude trach/needs to repeat words.  Social Interaction Social Interaction assist level: Interacts appropriately 75 - 89% of the time - Needs redirection for appropriate language or to initiate interaction.  Problem Solving Problem solving assist level: Solves basic 25 - 49% of the time - needs direction more than half the time to initiate, plan or complete simple activities  Memory Memory assist level: Recognizes or recalls 50 - 74% of the time/requires cueing 25 - 49% of the time    Pain Pain Assessment Pain Assessment: No/denies pain Pain Score: 0-No pain  Therapy/Group: Individual Therapy  Jaivyn Gulla, Selinda Orion 07/30/2015, 12:54 PM

## 2015-07-30 NOTE — Progress Notes (Signed)
Occupational Therapy Session Note  Patient Details  Name: Becky Patterson MRN: [id number] Date of Birth: 07-28-1955  Today's Date: 07/30/2015 OT Individual Time: 5701-7793 OT Individual Time Calculation (min): 30 min   Short Term Goals: Week 1:  OT Short Term Goal 1 (Week 1): Pt will maintain static sitting balance EOB for 2 mins with close supervision in preparation for selfcare tasks.  OT Short Term Goal 1 - Progress (Week 1): Not met OT Short Term Goal 2 (Week 1): Pt will complete UB bathing and dressing sitting supported with min assist. OT Short Term Goal 2 - Progress (Week 1): Not met OT Short Term Goal 3 (Week 1): Pt will transfer squat pivot to the drop arm commode with max assist of 1 person.  OT Short Term Goal 3 - Progress (Week 1): Not met OT Short Term Goal 4 (Week 1): Pt will locate all grooming and bathing items placed left of midline with no more than mod instructional cueing. OT Short Term Goal 4 - Progress (Week 1): Not met OT Short Term Goal 5 (Week 1): Pt's spouse will return demonstrate safe assist with LUE PROM exercises following handout.   OT Short Term Goal 5 - Progress (Week 1): Met Week 2:  OT Short Term Goal 1 (Week 2): Pt will maintain static sitting balance EOB for 2 mins with close supervision in preparation for selfcare tasks.  OT Short Term Goal 1 - Progress (Week 2): Met OT Short Term Goal 2 (Week 2): Pt will complete UB bathing and dressing sitting supported with min assist. OT Short Term Goal 2 - Progress (Week 2): Met OT Short Term Goal 3 (Week 2): Pt will transfer squat pivot to the drop arm commode with max assist of 1 person.  OT Short Term Goal 3 - Progress (Week 2): Progressing toward goal OT Short Term Goal 4 (Week 2): Pt will locate all grooming and bathing items placed left of midline with no more than mod instructional cueing. OT Short Term Goal 4 - Progress (Week 2): Progressing toward goal OT Short Term Goal 5 (Week 2): Pt will use the LUE  during bathing sessions with max assist.   OT Short Term Goal 5 - Progress (Week 2): Not progressing Week 3:  OT Short Term Goal 1 (Week 3): Pt will transfer to drop arm BSC with max A x 1. OT Short Term Goal 2 (Week 3): Pt will don shirt with min A. OT Short Term Goal 3 (Week 3): Pt will bathe will mod A sitting on BSC.  Skilled Therapeutic Interventions/Progress Updates:  Patient received seated in w/c with quick release belt donned and husband present. Pt with no complaints of pain. Husband left room to go get lunch. Therapist engaged patient in self-care task of brushing her hair and putting lotion on BUEs and BLEs. While pt seated at sink in front of mirror, had pt work on exercises to LUE X10 each; shoulder shrugging, shoulder retraction, shoulder flexion/extension, elbow flexion/extension, forearm supination/pronation, wrist flexion/extension, finger flexion/extension. Husband present at end of session and pt with request to drink water. Pt and husband aware of water protocol. Husband stated he could assist her with this. Left pt seated in w/c with quick release belt donned, husband present, and all needs within reach.   Therapy Documentation Precautions:  Precautions Precautions: Fall Precaution Comments: Lt neglect , poor trunk control, decreased awareness Restrictions Weight Bearing Restrictions: No  Vital Signs: Therapy Vitals Temp: 99.6 F (37.6 C) Temp Source:  Oral Pulse Rate: 93 Resp: 18 BP: 107/67 mmHg Patient Position (if appropriate): Lying Oxygen Therapy SpO2: 94 % O2 Device: Not Delivered  See Function Navigator for Current Functional Status.  Therapy/Group: Individual Therapy  Chrys Racer , MS, OTR/L, CLT Pager: [phone]  07/30/2015, 12:12 PM

## 2015-07-30 NOTE — Progress Notes (Signed)
Occupational Therapy Session Note  Patient Details  Name: Becky Patterson MRN: [id number] Date of Birth: [birth year]  Today's Date: 07/30/2015   OT Individual Time:830-945  OT Individual Time Calculation (min): 75 min    Short Term Goals: Week 1:  OT Short Term Goal 1 (Week 1): Pt will maintain static sitting balance EOB for 2 mins with close supervision in preparation for selfcare tasks.  OT Short Term Goal 1 - Progress (Week 1): Not met OT Short Term Goal 2 (Week 1): Pt will complete UB bathing and dressing sitting supported with min assist. OT Short Term Goal 2 - Progress (Week 1): Not met OT Short Term Goal 3 (Week 1): Pt will transfer squat pivot to the drop arm commode with max assist of 1 person.  OT Short Term Goal 3 - Progress (Week 1): Not met OT Short Term Goal 4 (Week 1): Pt will locate all grooming and bathing items placed left of midline with no more than mod instructional cueing. OT Short Term Goal 4 - Progress (Week 1): Not met OT Short Term Goal 5 (Week 1): Pt's spouse will return demonstrate safe assist with LUE PROM exercises following handout.   OT Short Term Goal 5 - Progress (Week 1): Met Week 2:  OT Short Term Goal 1 (Week 2): Pt will maintain static sitting balance EOB for 2 mins with close supervision in preparation for selfcare tasks.  OT Short Term Goal 1 - Progress (Week 2): Met OT Short Term Goal 2 (Week 2): Pt will complete UB bathing and dressing sitting supported with min assist. OT Short Term Goal 2 - Progress (Week 2): Met OT Short Term Goal 3 (Week 2): Pt will transfer squat pivot to the drop arm commode with max assist of 1 person.  OT Short Term Goal 3 - Progress (Week 2): Progressing toward goal OT Short Term Goal 4 (Week 2): Pt will locate all grooming and bathing items placed left of midline with no more than mod instructional cueing. OT Short Term Goal 4 - Progress (Week 2): Progressing toward goal OT Short Term Goal 5 (Week 2): Pt will use the LUE  during bathing sessions with max assist.   OT Short Term Goal 5 - Progress (Week 2): Not progressing Week 3:  OT Short Term Goal 1 (Week 3): Pt will transfer to drop arm BSC with max A x 1. OT Short Term Goal 2 (Week 3): Pt will don shirt with min A. OT Short Term Goal 3 (Week 3): Pt will bathe will mod A sitting on BSC.      Skilled Therapeutic Interventions/Progress Updates:    Pt seen for skilled OT to facilitate functional mobility, trunk control, postural awareness, L side scanning with functional activities. Pt received in bed to use bed pan and have LB clothing donned. UB self care not addressed as pt connected to PICC line.  Pt transferred to wc +2 A and taken to gym to transfer to mat. LUE wt bearing with wedge under left hip for pelvic alignment and table under R arm to keep R trunk elongated.  Pt worked on scanning to left with min cues to fully turn head to left to find items. Pt demonstrated improvement with trunk rotation to her L without collapsing into left side. Min facilitation at scapula to achieve greater degree of rotation.  No c/o arm pain today. Transferred back to chair and requested spouse to assist her with oral care for water protocol. Pt taken back  to room with spouse and tech to assist with PICC line pole.    Therapy Documentation Precautions:  Precautions Precautions: Fall Precaution Comments: Lt neglect , poor trunk control, decreased awareness Restrictions Weight Bearing Restrictions: No     Pain: Pain Assessment Pain Assessment: No/denies pain Pain Score: 0-No pain Pain Intervention(s): Medication (See eMAR);Other (Comment) (Premedicated for therapy)   ADL:  See Function Navigator for Current Functional Status.   Therapy/Group: Individual Therapy  [place] 07/30/2015, 12:13 PM

## 2015-07-30 NOTE — Progress Notes (Signed)
Physical Therapy Session Note  Patient Details  Name: Becky Patterson MRN: [id number] Date of Birth: [birth year]  Today's Date: 07/30/2015 PT Individual Time: 1435-1530 PT Individual Time Calculation (min): 55 min   Short Term Goals: Week 3:  PT Short Term Goal 1 (Week 3): STG = LTG  Skilled Therapeutic Interventions/Progress Updates:    Pt received in bed & agreeable to PT. Pt transferred supine>sit with Max A from PT To support trunk. Pt then completed sliding board transfer with total A for board placement, and Max A to scoot to right.  Pt able to initiate task but unable to scoot buttocks across board. Pt transported to gym via w/c total A for time management. PT then assisted pt with performing squat pivot transfer w/c>mat table Max A +2. Pt stood completely upright during transfer and had difficulty turning buttocks towards mat table. Once sitting, tx focused on sitting balance without UE support. Mirror was used for visual feedback & PT had to cue pt multiple times to utilize mirror for feedback. Pt with posterior pelvic tilt requiring cuing to help correct, and pt pushes to L. Pt requires maximum verbal, tactile & visual cues to achieve midline and hold head in neutral position. Pt performed lateral push up with LUE to allow weight-bearing through LUE. Pt then transferred sit>stand with Max A +2 with hemiwalker. PT blocked pt's L knee & helped prevent pt from pushing to L; pt only able to maintain standing for seconds at a time. After standing x 3 trials pt completed squat pivot mat>w/c with Max A. Pt then transferred sit>stand from w/c with maximum encouragement & Max A + 2 and was able to stand ~20 seconds before sitting. Pt transported back to room & left in w/c with QRB in place, husband present & all needs within reach.   Therapy Documentation Precautions:  Precautions Precautions: Fall Precaution Comments: Lt neglect , poor trunk control, decreased awareness Restrictions Weight  Bearing Restrictions: No Pain: Pain Assessment Pain Assessment: No/denies pain   See Function Navigator for Current Functional Status.   Therapy/Group: Individual Therapy  Waunita Schooner 07/30/2015, 3:54 PM

## 2015-07-31 ENCOUNTER — Inpatient Hospital Stay (HOSPITAL_COMMUNITY): Payer: 59

## 2015-07-31 ENCOUNTER — Ambulatory Visit (HOSPITAL_COMMUNITY): Payer: 59 | Admitting: Occupational Therapy

## 2015-07-31 ENCOUNTER — Inpatient Hospital Stay (HOSPITAL_COMMUNITY): Payer: 59 | Admitting: Physical Therapy

## 2015-07-31 ENCOUNTER — Inpatient Hospital Stay (HOSPITAL_COMMUNITY)
Admission: AD | Admit: 2015-07-31 | Discharge: 2015-08-07 | DRG: 871 | Disposition: A | Discharge status: Hospice | Payer: 59 | Source: Ambulatory Visit | Attending: Internal Medicine | Admitting: Internal Medicine

## 2015-07-31 ENCOUNTER — Inpatient Hospital Stay (HOSPITAL_COMMUNITY): Payer: 59 | Admitting: Occupational Therapy

## 2015-07-31 ENCOUNTER — Inpatient Hospital Stay (HOSPITAL_COMMUNITY): Payer: 59 | Admitting: Speech Pathology

## 2015-07-31 DIAGNOSIS — R414 Neurologic neglect syndrome: Secondary | ICD-10-CM | POA: Diagnosis present

## 2015-07-31 DIAGNOSIS — R739 Hyperglycemia, unspecified: Secondary | ICD-10-CM | POA: Diagnosis present

## 2015-07-31 DIAGNOSIS — D638 Anemia in other chronic diseases classified elsewhere: Secondary | ICD-10-CM | POA: Diagnosis present

## 2015-07-31 DIAGNOSIS — I5031 Acute diastolic (congestive) heart failure: Secondary | ICD-10-CM | POA: Diagnosis present

## 2015-07-31 DIAGNOSIS — A498 Other bacterial infections of unspecified site: Secondary | ICD-10-CM | POA: Insufficient documentation

## 2015-07-31 DIAGNOSIS — R32 Unspecified urinary incontinence: Secondary | ICD-10-CM | POA: Diagnosis present

## 2015-07-31 DIAGNOSIS — J189 Pneumonia, unspecified organism: Secondary | ICD-10-CM | POA: Diagnosis present

## 2015-07-31 DIAGNOSIS — I959 Hypotension, unspecified: Secondary | ICD-10-CM

## 2015-07-31 DIAGNOSIS — O85 Puerperal sepsis: Secondary | ICD-10-CM

## 2015-07-31 DIAGNOSIS — I63411 Cerebral infarction due to embolism of right middle cerebral artery: Secondary | ICD-10-CM

## 2015-07-31 DIAGNOSIS — J9622 Acute and chronic respiratory failure with hypercapnia: Secondary | ICD-10-CM | POA: Diagnosis not present

## 2015-07-31 DIAGNOSIS — D735 Infarction of spleen: Secondary | ICD-10-CM | POA: Diagnosis present

## 2015-07-31 DIAGNOSIS — B952 Enterococcus as the cause of diseases classified elsewhere: Secondary | ICD-10-CM | POA: Diagnosis present

## 2015-07-31 DIAGNOSIS — R630 Anorexia: Secondary | ICD-10-CM | POA: Diagnosis present

## 2015-07-31 DIAGNOSIS — A419 Sepsis, unspecified organism: Secondary | ICD-10-CM | POA: Diagnosis present

## 2015-07-31 DIAGNOSIS — R197 Diarrhea, unspecified: Secondary | ICD-10-CM | POA: Diagnosis present

## 2015-07-31 DIAGNOSIS — Z1629 Resistance to other single specified antibiotic: Secondary | ICD-10-CM | POA: Diagnosis present

## 2015-07-31 DIAGNOSIS — B9689 Other specified bacterial agents as the cause of diseases classified elsewhere: Secondary | ICD-10-CM | POA: Diagnosis present

## 2015-07-31 DIAGNOSIS — Z881 Allergy status to other antibiotic agents status: Secondary | ICD-10-CM | POA: Diagnosis not present

## 2015-07-31 DIAGNOSIS — Z885 Allergy status to narcotic agent status: Secondary | ICD-10-CM

## 2015-07-31 DIAGNOSIS — R0600 Dyspnea, unspecified: Secondary | ICD-10-CM

## 2015-07-31 DIAGNOSIS — J9601 Acute respiratory failure with hypoxia: Secondary | ICD-10-CM | POA: Diagnosis present

## 2015-07-31 DIAGNOSIS — I76 Septic arterial embolism: Secondary | ICD-10-CM

## 2015-07-31 DIAGNOSIS — F17200 Nicotine dependence, unspecified, uncomplicated: Secondary | ICD-10-CM | POA: Diagnosis present

## 2015-07-31 DIAGNOSIS — R918 Other nonspecific abnormal finding of lung field: Secondary | ICD-10-CM | POA: Diagnosis present

## 2015-07-31 DIAGNOSIS — Y95 Nosocomial condition: Secondary | ICD-10-CM | POA: Diagnosis present

## 2015-07-31 DIAGNOSIS — Z8673 Personal history of transient ischemic attack (TIA), and cerebral infarction without residual deficits: Secondary | ICD-10-CM

## 2015-07-31 DIAGNOSIS — Y848 Other medical procedures as the cause of abnormal reaction of the patient, or of later complication, without mention of misadventure at the time of the procedure: Secondary | ICD-10-CM | POA: Diagnosis present

## 2015-07-31 DIAGNOSIS — R6521 Severe sepsis with septic shock: Secondary | ICD-10-CM | POA: Diagnosis present

## 2015-07-31 DIAGNOSIS — I69391 Dysphagia following cerebral infarction: Secondary | ICD-10-CM | POA: Diagnosis not present

## 2015-07-31 DIAGNOSIS — R509 Fever, unspecified: Secondary | ICD-10-CM

## 2015-07-31 DIAGNOSIS — E872 Acidosis: Secondary | ICD-10-CM | POA: Diagnosis present

## 2015-07-31 DIAGNOSIS — I340 Nonrheumatic mitral (valve) insufficiency: Secondary | ICD-10-CM | POA: Diagnosis present

## 2015-07-31 DIAGNOSIS — Z452 Encounter for adjustment and management of vascular access device: Secondary | ICD-10-CM

## 2015-07-31 DIAGNOSIS — E876 Hypokalemia: Secondary | ICD-10-CM | POA: Diagnosis present

## 2015-07-31 DIAGNOSIS — R451 Restlessness and agitation: Secondary | ICD-10-CM | POA: Diagnosis present

## 2015-07-31 DIAGNOSIS — Z7189 Other specified counseling: Secondary | ICD-10-CM | POA: Diagnosis not present

## 2015-07-31 DIAGNOSIS — R7881 Bacteremia: Secondary | ICD-10-CM | POA: Diagnosis present

## 2015-07-31 DIAGNOSIS — D6489 Other specified anemias: Secondary | ICD-10-CM | POA: Diagnosis present

## 2015-07-31 DIAGNOSIS — Z79899 Other long term (current) drug therapy: Secondary | ICD-10-CM

## 2015-07-31 DIAGNOSIS — G8194 Hemiplegia, unspecified affecting left nondominant side: Secondary | ICD-10-CM | POA: Diagnosis not present

## 2015-07-31 DIAGNOSIS — J9602 Acute respiratory failure with hypercapnia: Secondary | ICD-10-CM | POA: Diagnosis present

## 2015-07-31 DIAGNOSIS — Z66 Do not resuscitate: Secondary | ICD-10-CM | POA: Diagnosis present

## 2015-07-31 DIAGNOSIS — Q859 Phakomatosis, unspecified: Secondary | ICD-10-CM

## 2015-07-31 DIAGNOSIS — I330 Acute and subacute infective endocarditis: Secondary | ICD-10-CM | POA: Diagnosis not present

## 2015-07-31 DIAGNOSIS — Z1619 Resistance to other specified beta lactam antibiotics: Secondary | ICD-10-CM | POA: Diagnosis present

## 2015-07-31 DIAGNOSIS — A4159 Other Gram-negative sepsis: Principal | ICD-10-CM | POA: Diagnosis present

## 2015-07-31 DIAGNOSIS — R112 Nausea with vomiting, unspecified: Secondary | ICD-10-CM

## 2015-07-31 DIAGNOSIS — I059 Rheumatic mitral valve disease, unspecified: Secondary | ICD-10-CM | POA: Diagnosis present

## 2015-07-31 DIAGNOSIS — I69354 Hemiplegia and hemiparesis following cerebral infarction affecting left non-dominant side: Secondary | ICD-10-CM

## 2015-07-31 DIAGNOSIS — I509 Heart failure, unspecified: Secondary | ICD-10-CM

## 2015-07-31 DIAGNOSIS — N390 Urinary tract infection, site not specified: Secondary | ICD-10-CM | POA: Diagnosis present

## 2015-07-31 DIAGNOSIS — Z515 Encounter for palliative care: Secondary | ICD-10-CM | POA: Diagnosis not present

## 2015-07-31 DIAGNOSIS — Z792 Long term (current) use of antibiotics: Secondary | ICD-10-CM | POA: Diagnosis not present

## 2015-07-31 DIAGNOSIS — D72829 Elevated white blood cell count, unspecified: Secondary | ICD-10-CM

## 2015-07-31 DIAGNOSIS — I669 Occlusion and stenosis of unspecified cerebral artery: Secondary | ICD-10-CM | POA: Diagnosis present

## 2015-07-31 DIAGNOSIS — I2690 Septic pulmonary embolism without acute cor pulmonale: Secondary | ICD-10-CM | POA: Diagnosis not present

## 2015-07-31 LAB — CBC WITH DIFFERENTIAL/PLATELET
BASOS ABS: 0 10*3/uL (ref 0.0–0.1)
BASOS PCT: 0 %
Basophils Absolute: 0 10*3/uL (ref 0.0–0.1)
Basophils Relative: 0 %
EOS ABS: 0 10*3/uL (ref 0.0–0.7)
Eosinophils Absolute: 0.1 10*3/uL (ref 0.0–0.7)
Eosinophils Relative: 2 %
Eosinophils Relative: 0 %
HCT: 30.8 % — ABNORMAL LOW (ref 36.0–46.0)
HEMATOCRIT: 31.8 % — AB (ref 36.0–46.0)
HEMOGLOBIN: 10.1 g/dL — AB (ref 12.0–15.0)
Hemoglobin: 9.8 g/dL — ABNORMAL LOW (ref 12.0–15.0)
LYMPHS PCT: 10 %
Lymphocytes Relative: 3 %
Lymphs Abs: 0.8 10*3/uL (ref 0.7–4.0)
Lymphs Abs: 0.4 10*3/uL — ABNORMAL LOW (ref 0.7–4.0)
MCH: 30 pg (ref 26.0–34.0)
MCH: 29.8 pg (ref 26.0–34.0)
MCHC: 31.8 g/dL (ref 30.0–36.0)
MCHC: 31.8 g/dL (ref 30.0–36.0)
MCV: 94.4 fL (ref 78.0–100.0)
MCV: 93.6 fL (ref 78.0–100.0)
MONO ABS: 0.1 10*3/uL (ref 0.1–1.0)
MONOS PCT: 1 %
Monocytes Absolute: 0 10*3/uL — ABNORMAL LOW (ref 0.1–1.0)
Monocytes Relative: 0 %
NEUTROS ABS: 7.2 10*3/uL (ref 1.7–7.7)
NEUTROS PCT: 88 %
NEUTROS PCT: 96 %
Neutro Abs: 13.4 10*3/uL — ABNORMAL HIGH (ref 1.7–7.7)
PLATELETS: 334 10*3/uL (ref 150–400)
Platelets: 364 10*3/uL (ref 150–400)
RBC: 3.37 MIL/uL — AB (ref 3.87–5.11)
RBC: 3.29 MIL/uL — ABNORMAL LOW (ref 3.87–5.11)
RDW: 14.9 % (ref 11.5–15.5)
RDW: 15 % (ref 11.5–15.5)
WBC: 8.1 10*3/uL (ref 4.0–10.5)
WBC: 13.9 10*3/uL — ABNORMAL HIGH (ref 4.0–10.5)

## 2015-07-31 LAB — URINALYSIS, ROUTINE W REFLEX MICROSCOPIC
Bilirubin Urine: NEGATIVE
GLUCOSE, UA: NEGATIVE mg/dL
Ketones, ur: NEGATIVE mg/dL
Nitrite: NEGATIVE
PH: 6 (ref 5.0–8.0)
PROTEIN: 30 mg/dL — AB
Specific Gravity, Urine: 1.018 (ref 1.005–1.030)

## 2015-07-31 LAB — LACTIC ACID, PLASMA
LACTIC ACID, VENOUS: 0.9 mmol/L (ref 0.5–2.0)
Lactic Acid, Venous: 3 mmol/L (ref 0.5–2.0)
Lactic Acid, Venous: 2.4 mmol/L (ref 0.5–2.0)
Lactic Acid, Venous: 2.3 mmol/L (ref 0.5–2.0)
Lactic Acid, Venous: 1.1 mmol/L (ref 0.5–2.0)

## 2015-07-31 LAB — COMPREHENSIVE METABOLIC PANEL
ALBUMIN: 2.8 g/dL — AB (ref 3.5–5.0)
ALT: 20 U/L (ref 14–54)
ANION GAP: 12 (ref 5–15)
AST: 33 U/L (ref 15–41)
Alkaline Phosphatase: 127 U/L — ABNORMAL HIGH (ref 38–126)
BUN: 16 mg/dL (ref 6–20)
CO2: 23 mmol/L (ref 22–32)
Calcium: 9.5 mg/dL (ref 8.9–10.3)
Chloride: 103 mmol/L (ref 101–111)
Creatinine, Ser: 0.86 mg/dL (ref 0.44–1.00)
GFR calc Af Amer: >60 mL/min (ref >60)
GFR calc non Af Amer: >60 mL/min (ref >60)
GLUCOSE: 141 mg/dL — AB (ref 65–99)
POTASSIUM: 3.6 mmol/L (ref 3.5–5.1)
SODIUM: 138 mmol/L (ref 135–145)
TOTAL PROTEIN: 7.3 g/dL (ref 6.5–8.1)
Total Bilirubin: 0.8 mg/dL (ref 0.3–1.2)

## 2015-07-31 LAB — POCT I-STAT 3, ART BLOOD GAS (G3+)
Acid-base deficit: 1 mmol/L (ref 0.0–2.0)
Bicarbonate: 23.7 mEq/L (ref 20.0–24.0)
O2 Saturation: 99 %
PCO2 ART: 40.7 mmHg (ref 35.0–45.0)
PH ART: 7.373 (ref 7.350–7.450)
TCO2: 25 mmol/L (ref 0–100)
pO2, Arterial: 127 mmHg — ABNORMAL HIGH (ref 80.0–100.0)

## 2015-07-31 LAB — BASIC METABOLIC PANEL
Anion gap: 14 (ref 5–15)
BUN: 16 mg/dL (ref 6–20)
CHLORIDE: 102 mmol/L (ref 101–111)
CO2: 21 mmol/L — ABNORMAL LOW (ref 22–32)
Calcium: 9.3 mg/dL (ref 8.9–10.3)
Creatinine, Ser: 0.85 mg/dL (ref 0.44–1.00)
Glucose, Bld: 216 mg/dL — ABNORMAL HIGH (ref 65–99)
POTASSIUM: 3.4 mmol/L — AB (ref 3.5–5.1)
SODIUM: 137 mmol/L (ref 135–145)

## 2015-07-31 LAB — URINE MICROSCOPIC-ADD ON

## 2015-07-31 LAB — BLOOD GAS, ARTERIAL
Acid-base deficit: 7 mmol/L — ABNORMAL HIGH (ref 0.0–2.0)
Bicarbonate: 19.8 mEq/L — ABNORMAL LOW (ref 20.0–24.0)
DRAWN BY: 365271
O2 Content: 15 L/min
O2 SAT: 96.1 %
PO2 ART: 120 mmHg — AB (ref 80.0–100.0)
Patient temperature: 102.2
TCO2: 21.4 mmol/L (ref 0–100)
pCO2 arterial: 58.6 mmHg (ref 35.0–45.0)
pH, Arterial: 7.169 — CL (ref 7.350–7.450)

## 2015-07-31 LAB — GLUCOSE, CAPILLARY
GLUCOSE-CAPILLARY: 110 mg/dL — AB (ref 65–99)
GLUCOSE-CAPILLARY: 130 mg/dL — AB (ref 65–99)
GLUCOSE-CAPILLARY: 132 mg/dL — AB (ref 65–99)
Glucose-Capillary: 232 mg/dL — ABNORMAL HIGH (ref 65–99)

## 2015-07-31 LAB — INFLUENZA PANEL BY PCR (TYPE A & B)
H1N1FLUPCR: NOT DETECTED
INFLAPCR: NEGATIVE
INFLBPCR: NEGATIVE

## 2015-07-31 LAB — APTT: APTT: 32 s (ref 24–37)

## 2015-07-31 LAB — PROCALCITONIN: Procalcitonin: 4.53 ng/mL

## 2015-07-31 LAB — PROTIME-INR
INR: 1.46 (ref 0.00–1.49)
PROTHROMBIN TIME: 17.8 s — AB (ref 11.6–15.2)

## 2015-07-31 LAB — TSH: TSH: 1.848 u[IU]/mL (ref 0.350–4.500)

## 2015-07-31 LAB — MRSA PCR SCREENING: MRSA BY PCR: NEGATIVE

## 2015-07-31 MED ORDER — SODIUM CHLORIDE 0.9 % IV SOLN
INTRAVENOUS | Status: DC
  Administered 2015-07-31: 10:00:00 via INTRAVENOUS

## 2015-07-31 MED ORDER — ACETAMINOPHEN 650 MG RE SUPP
650.0000 mg | Freq: Four times a day (QID) | RECTAL | Status: DC | PRN
  Administered 2015-08-01 – 2015-08-04 (×3): 650 mg via RECTAL
  Filled 2015-07-31 (×3): qty 1

## 2015-07-31 MED ORDER — LORAZEPAM 2 MG/ML IJ SOLN
INTRAMUSCULAR | Status: AC
  Administered 2015-07-31: 05:00:00
  Filled 2015-07-31: qty 1

## 2015-07-31 MED ORDER — VANCOMYCIN HCL IN DEXTROSE 1-5 GM/200ML-% IV SOLN
1000.0000 mg | Freq: Two times a day (BID) | INTRAVENOUS | Status: DC
  Administered 2015-07-31 – 2015-08-01 (×2): 1000 mg via INTRAVENOUS
  Filled 2015-07-31 (×3): qty 200

## 2015-07-31 MED ORDER — ONDANSETRON HCL 4 MG/2ML IJ SOLN: 4.0000 mg | Freq: Four times a day (QID) | INTRAMUSCULAR | Status: DC | PRN

## 2015-07-31 MED ORDER — SODIUM CHLORIDE 0.9 % IV SOLN
500.0000 mg | Freq: Three times a day (TID) | INTRAVENOUS | Status: DC
  Administered 2015-07-31 – 2015-08-03 (×10): 500 mg via INTRAVENOUS
  Filled 2015-07-31 (×13): qty 500

## 2015-07-31 MED ORDER — DEXMEDETOMIDINE HCL IN NACL 200 MCG/50ML IV SOLN
0.4000 ug/kg/h | INTRAVENOUS | Status: DC
  Administered 2015-07-31 (×2): 0.4 ug/kg/h via INTRAVENOUS
  Administered 2015-08-01: 0.5 ug/kg/h via INTRAVENOUS
  Administered 2015-08-01: 0.6 ug/kg/h via INTRAVENOUS
  Filled 2015-07-31 (×4): qty 50

## 2015-07-31 MED ORDER — SODIUM CHLORIDE 0.9% FLUSH
3.0000 mL | Freq: Two times a day (BID) | INTRAVENOUS | Status: DC
  Administered 2015-07-31 – 2015-08-01 (×4): 3 mL via INTRAVENOUS

## 2015-07-31 MED ORDER — LEVALBUTEROL HCL 0.63 MG/3ML IN NEBU
0.6300 mg | INHALATION_SOLUTION | Freq: Four times a day (QID) | RESPIRATORY_TRACT | Status: DC | PRN
  Administered 2015-08-03 – 2015-08-04 (×2): 0.63 mg via RESPIRATORY_TRACT
  Filled 2015-07-31 (×3): qty 3

## 2015-07-31 MED ORDER — CETYLPYRIDINIUM CHLORIDE 0.05 % MT LIQD
7.0000 mL | Freq: Two times a day (BID) | OROMUCOSAL | Status: DC
  Administered 2015-07-31 – 2015-08-06 (×14): 7 mL via OROMUCOSAL

## 2015-07-31 MED ORDER — LORAZEPAM 2 MG/ML IJ SOLN
0.5000 mg | Freq: Once | INTRAMUSCULAR | Status: AC
  Administered 2015-07-31: 0.5 mg via INTRAVENOUS

## 2015-07-31 MED ORDER — FUROSEMIDE 10 MG/ML IJ SOLN
40.0000 mg | Freq: Once | INTRAMUSCULAR | Status: AC
  Administered 2015-07-31: 40 mg via INTRAVENOUS

## 2015-07-31 MED ORDER — NICARDIPINE HCL IN NACL 20-0.86 MG/200ML-% IV SOLN
3.0000 mg/h | INTRAVENOUS | Status: DC
  Filled 2015-07-31: qty 200

## 2015-07-31 MED ORDER — INSULIN ASPART 100 UNIT/ML ~~LOC~~ SOLN
0.0000 [IU] | SUBCUTANEOUS | Status: DC
  Administered 2015-07-31: 2 [IU] via SUBCUTANEOUS
  Administered 2015-07-31 (×2): 1 [IU] via SUBCUTANEOUS
  Administered 2015-08-01: 2 [IU] via SUBCUTANEOUS
  Administered 2015-08-01 (×2): 3 [IU] via SUBCUTANEOUS
  Administered 2015-08-02: 1 [IU] via SUBCUTANEOUS
  Administered 2015-08-02: 2 [IU] via SUBCUTANEOUS
  Administered 2015-08-03 – 2015-08-04 (×5): 1 [IU] via SUBCUTANEOUS
  Administered 2015-08-05: 3 [IU] via SUBCUTANEOUS
  Administered 2015-08-05: 2 [IU] via SUBCUTANEOUS
  Administered 2015-08-05: 1 [IU] via SUBCUTANEOUS
  Administered 2015-08-06 (×2): 2 [IU] via SUBCUTANEOUS
  Administered 2015-08-06 – 2015-08-07 (×4): 1 [IU] via SUBCUTANEOUS
  Administered 2015-08-07: 2 [IU] via SUBCUTANEOUS

## 2015-07-31 MED ORDER — SODIUM CHLORIDE 0.9 % IV SOLN: 750.0000 mL | INTRAVENOUS | Status: DC | PRN

## 2015-07-31 MED ORDER — LEVALBUTEROL HCL 0.63 MG/3ML IN NEBU
INHALATION_SOLUTION | RESPIRATORY_TRACT | Status: AC
  Administered 2015-07-31: 05:00:00
  Filled 2015-07-31: qty 3

## 2015-07-31 MED ORDER — SODIUM CHLORIDE 0.9 % IV SOLN
1250.0000 mg | Freq: Once | INTRAVENOUS | Status: AC
  Administered 2015-07-31: 1250 mg via INTRAVENOUS

## 2015-07-31 MED ORDER — CHLORHEXIDINE GLUCONATE 0.12 % MT SOLN
15.0000 mL | Freq: Two times a day (BID) | OROMUCOSAL | Status: DC
  Administered 2015-07-31 – 2015-08-07 (×14): 15 mL via OROMUCOSAL
  Filled 2015-07-31 (×7): qty 15

## 2015-07-31 MED ORDER — VANCOMYCIN HCL 10 G IV SOLR
1250.0000 mg | Freq: Once | INTRAVENOUS | Status: DC
  Filled 2015-07-31: qty 1250

## 2015-07-31 MED ORDER — FUROSEMIDE 10 MG/ML IJ SOLN
INTRAMUSCULAR | Status: AC
  Filled 2015-07-31: qty 4

## 2015-07-31 MED ORDER — SODIUM CHLORIDE 0.9% FLUSH: 3.0000 mL | Freq: Two times a day (BID) | INTRAVENOUS | Status: DC

## 2015-07-31 MED ORDER — ONDANSETRON HCL 4 MG PO TABS: 4.0000 mg | ORAL_TABLET | Freq: Four times a day (QID) | ORAL | Status: DC | PRN

## 2015-07-31 MED ORDER — ENOXAPARIN SODIUM 40 MG/0.4ML ~~LOC~~ SOLN
40.0000 mg | SUBCUTANEOUS | Status: DC
  Administered 2015-07-31 – 2015-08-07 (×8): 40 mg via SUBCUTANEOUS
  Filled 2015-07-31 (×9): qty 0.4

## 2015-07-31 MED ORDER — PIPERACILLIN-TAZOBACTAM 3.375 G IVPB
3.3750 g | Freq: Three times a day (TID) | INTRAVENOUS | Status: DC
  Filled 2015-07-31 (×2): qty 50

## 2015-07-31 MED ORDER — VANCOMYCIN HCL IN DEXTROSE 1-5 GM/200ML-% IV SOLN: 1000.0000 mg | Freq: Two times a day (BID) | INTRAVENOUS | Status: DC

## 2015-07-31 MED ORDER — ACETAMINOPHEN 325 MG PO TABS
650.0000 mg | ORAL_TABLET | Freq: Four times a day (QID) | ORAL | Status: DC | PRN
  Administered 2015-07-31 – 2015-08-06 (×5): 650 mg via ORAL
  Filled 2015-07-31 (×8): qty 2

## 2015-07-31 MED ORDER — ONDANSETRON HCL 4 MG/2ML IJ SOLN
4.0000 mg | Freq: Four times a day (QID) | INTRAMUSCULAR | Status: DC | PRN
  Administered 2015-08-06: 4 mg via INTRAVENOUS
  Filled 2015-07-31: qty 2

## 2015-07-31 MED ORDER — NOREPINEPHRINE BITARTRATE 1 MG/ML IV SOLN
2.0000 ug/min | INTRAVENOUS | Status: DC
  Administered 2015-07-31: 15 ug/min via INTRAVENOUS
  Administered 2015-07-31: 4 ug/min via INTRAVENOUS
  Administered 2015-08-01: 13 ug/min via INTRAVENOUS
  Administered 2015-08-01: 20 ug/min via INTRAVENOUS
  Filled 2015-07-31 (×5): qty 4

## 2015-07-31 MED ORDER — DEXMEDETOMIDINE BOLUS VIA INFUSION
1.0000 ug/kg | Freq: Once | INTRAVENOUS | Status: AC
  Administered 2015-07-31: 47.8 ug via INTRAVENOUS
  Filled 2015-07-31: qty 48

## 2015-07-31 MED ORDER — SODIUM CHLORIDE 0.9 % IV BOLUS (SEPSIS)
2500.0000 mL | Freq: Once | INTRAVENOUS | Status: AC
  Administered 2015-07-31: 2500 mL via INTRAVENOUS

## 2015-07-31 NOTE — Progress Notes (Signed)
Dr. Ashok Cordia notified of sudden change in vital signs and overall presentation of patient. Patient now hypertensive, tachypnic, tachycardic, febrile, confused and anxious. Bipap applied to maintain sats. Order to give lasix, start precedex, and get stat x ray. Orders carried out. Will continue to monitor closely.

## 2015-07-31 NOTE — Progress Notes (Signed)
Pharmacy Antibiotic Note  Becky Patterson is a 60 y.o. female admitted on 07/07/2015 with CVA/endocarditis. Currently receiving Rocephin for Enterbacter endocarditis.  Pt now febrile and tachycardic, possible sepsis.   Pharmacy has been consulted for Vancomycin dosing.  Plan: Vancomycin 1250 mg IV now, then 1 g IV q12h F/U BMet  Height: '5\' 1"'$  (154.9 cm) Weight: 174 lb 2.6 oz (79 kg) IBW/kg (Calculated) : 47.8  Temp (24hrs), Avg:100.9 F (38.3 C), Min:99.6 F (37.6 C), Max:102.2 F (39 C)   Recent Labs Lab 07/25/15 0520 07/27/15 0829 07/28/15 0546  WBC 9.3  --   --   CREATININE 0.79 0.75 0.77    Estimated Creatinine Clearance: 70.3 mL/min (by C-G formula based on Cr of 0.77).    Allergies  Allergen Reactions  . Codeine Nausea And Vomiting    Antimicrobials this admission: Rocephin 2/11 >>  Vancomycin 3/9 >>   Dose adjustments this admission:  Microbiology results: 1/22 BCx: Enterobacter 1/22 UCx: Enterobacter   Caryl Pina 07/31/2015 5:39 AM

## 2015-07-31 NOTE — Procedures (Signed)
Central Venous Catheter Insertion Procedure Note Becky Patterson [id number] 04/27/1956  Procedure: Insertion of Central Venous Catheter Indications: Assessment of intravascular volume, Drug and/or fluid administration and Frequent blood sampling  Procedure Details Consent: Risks of procedure as well as the alternatives and risks of each were explained to the (patient/caregiver).  Consent for procedure obtained. Time Out: Verified patient identification, verified procedure, site/side was marked, verified correct patient position, special equipment/implants available, medications/allergies/relevent history reviewed, required imaging and test results available.  Performed Real time [place] was used to ID and cannulate the vessel  Maximum sterile technique was used including antiseptics, cap, gloves, gown, hand hygiene, mask and sheet. Skin prep: Chlorhexidine; local anesthetic administered A antimicrobial bonded/coated triple lumen catheter was placed in the left internal jugular vein using the Seldinger technique.  Evaluation Blood flow good Complications: No apparent complications Patient did tolerate procedure well. Chest X-ray ordered to verify placement.  CXR: pending.  Becky Patterson 07/31/2015, 12:02 PM

## 2015-07-31 NOTE — Progress Notes (Signed)
This is a no charge note  Need consultation per PA, Dan in rehab.  60 year old lady with past medical history of lung mass, mitral valve endocarditis, splenic infarct, currently on Rocephin. Pt is doing rehabilitation due to large right MCA stroke currently. Last night, the patient developed fever and oxygen desaturated to 50, which improved to 97 on nasal cannula. Chest x-ray showed basilar infiltration which is similar to previous chest x-ray. No BMP since 07/27/15. No CBC since 07/25/15. ABG showed pH of 7.16, PCO2 58.6, PO2 120. I recommend to do sepsis workup, IV fluid, broaden antibiotics to IV vancomycin and Zosyn, get urinalysis, urine culture and blood culture. Pt will be transferred to 3M09.   Ivor Costa, MD  [facility] Pager [phone]  If 7PM-7AM, please contact night-coverage [email or web address] Password [email or web address] 07/31/2015, 5:52 AM

## 2015-07-31 NOTE — Progress Notes (Signed)
Pt now hypotensive. Levo restarted to maintain MAP >65. U/O since lasix is 600. Called MD and updated. Will continue to monitor

## 2015-07-31 NOTE — Progress Notes (Signed)
ANTIBIOTIC CONSULT NOTE - INITIAL  Pharmacy Consult for Vanco/Primaxin Indication: sepsis  Allergies  Allergen Reactions  . Codeine Nausea And Vomiting    Patient Measurements:   Adjusted Body Weight:    Vital Signs: Temp: 99.4 F (37.4 C) (03/09 0800) Temp Source: Axillary (03/09 0800) BP: 73/46 mmHg (03/09 0815) Pulse Rate: 90 (03/09 0815) Intake/Output from previous day: 03/08 0701 - 03/09 0700 In: 2500 [IV Piggyback:2500] Out: -  Intake/Output from this shift: Total I/O In: 250 [IV Piggyback:250] Out: -   Labs:  Recent Labs  07/31/15 0515 07/31/15 0630  WBC 8.1 13.9*  HGB 10.1* 9.8*  PLT 364 334  CREATININE 0.85 0.86   Estimated Creatinine Clearance: 65.4 mL/min (by C-G formula based on Cr of 0.86). No results for input(s): VANCOTROUGH, VANCOPEAK, VANCORANDOM, GENTTROUGH, GENTPEAK, GENTRANDOM, TOBRATROUGH, TOBRAPEAK, TOBRARND, AMIKACINPEAK, AMIKACINTROU, AMIKACIN in the last 72 hours.   Microbiology:   Medical History: Past Medical History  Diagnosis Date  . Lung mass   . Bacterial endocarditis   . Splenic infarction   . Epistaxis     Admit Complaint:  60 y.o. female admitted on 07/07/2015 with CVA/endocarditis. Currently receiving Rocephin for Enterbacter endocarditis.Transferred from CIR to 71M with SOB with decreased O2 sats 3/9 overnight.   Anticoagulation: Lovenox '40mg'$ /d. INR 1.46  Infectious Disease: Known enterobacter endocarditis now has ? UTI +/- HCAPor Flu vs Asp PNA. PC 4.53 elevated. LA 2.4, WBC 13.9  Rocephin 2/11-3/9 Vanco 3/9 Primaxin 3/9>>  1/22 BCx: Enterobacter 1/22 UCx: Enterobacter   Cardiovascular: Last BP 73/46, HR 90  Endocrinology: SSI  Gastrointestinal / Nutrition: INR elevated 1.46. Alkphos 127 up. AST/ALT WNL.  Neurology: large R MCA CVA  Nephrology: Scr 0.86 stable  Pulmonary: lung mass  Hematology / Oncology  PTA Medication Issues  Best Practices: LMWH   Goal of Therapy:  Vancomycin trough level  15-20 mcg/ml  Plan:  Vanco '1250mg'$  IV x 1 then 1g IV q12h, trough after 3-5 doses at steady state. Change Zosyn to Primaxin '500mg'$  IV q8hr F/u cultures   Toia Micale S. Alford [place], PharmD, BCPS Clinical Staff Pharmacist Pager [phone]  Becky Patterson 07/31/2015,8:41 AM

## 2015-07-31 NOTE — Progress Notes (Addendum)
Pharmacy Antibiotic Note  Becky Patterson is a 60 y.o. female admitted on 07/07/2015 with CVA/endocarditis. Currently receiving Rocephin for Enterbacter endocarditis. Pt now febrile and tachycardic, possible sepsis. Pharmacy has been consulted for Vancomycin and Zosyn dosing.  Plan: Vancomycin 1250 mg IV now, then 1 g IV q12h Zosyn 3.375 g IV q8h   Height: '5\' 1"'$  (154.9 cm) Weight: 174 lb 2.6 oz (79 kg) IBW/kg (Calculated) : 47.8  Temp (24hrs), Avg:100.9 F (38.3 C), Min:99.6 F (37.6 C), Max:102.2 F (39 C)   Last Labs      Recent Labs Lab 07/25/15 0520 07/27/15 0829 07/28/15 0546  WBC 9.3 --  --   CREATININE 0.79 0.75 0.77      Estimated Creatinine Clearance: 70.3 mL/min (by C-G formula based on Cr of 0.77).   Allergies  Allergen Reactions  . Codeine Nausea And Vomiting    Antimicrobials this admission: Rocephin 2/11 >> 3/9 Vancomycin 3/9 >>  Zosyn 3/9 >>  Dose adjustments this admission:  Microbiology results: 1/22 BCx: Enterobacter 1/22 UCx: Enterobacter   Caryl Pina 07/31/2015 5:39 AM

## 2015-07-31 NOTE — Progress Notes (Signed)
[facility] Progress Note Patient Name: Becky Patterson DOB: [birth year] MRN: [id number]   Date of Service  07/31/2015  HPI/Events of Note  Camera check on patient after nurse called reporting that Patient is more anxious and now has hypoxia.Patient is slightly combative. NP at bedside.  eICU Interventions  1. Starting BiPAP. 2. Stat portable chest x-ray 3. Start Precedex infusion     Intervention Category Major Interventions: Acid-Base disturbance - evaluation and management Intermediate Interventions: Change in mental status - evaluation and management  Tera Partridge 07/31/2015, 6:12 PM

## 2015-07-31 NOTE — Progress Notes (Signed)
Apprx [id number] patient family member called for help. Arrived in the room patient was noted with  SOB. Family member mentioned that patient stated she was cold. Encouraged patient to start breathing techniques; orders were check for breathing treatment. Noted uses of accessory muscles to breath, continued to encourage patient to try to pace breathing. O2 sat assessed 85%, administered 4L N/C. Sat started to inclined, but immediately started to declined. Rapid response and charged nurse was notified. Patient O2 declined to 75%. Charge nurse arrived to room. Rapid response Jerene Pitch, RN) called delegated to administered 6L, patient not improving. [place]oke, RN delegated non breathing 50%, administered, sats improving. Staff was attentive to patient care and cared out MD's orders. MD was called, verbal orders given: READ BACK AND AFFIRM. Patient sats 96-99%. Administered ativan, noted to be effective and noted no use of accessory muscle when breathing after administering ativan. CXR, ABG, and labs. Respiratory administered BiPAP, patient is stable with no stress. MDs collaborated.Order to transfer patient to step down.  Langley Gauss, RN gave [place], RN report. Attending nurse Langley Gauss) educate family member of patient transferring to another unit. Charge Nurse educate family member on intent to transfer, as well. The Rehab MD had an ongoing communicaton with sister, as well. Staff work as a Therapist, occupational to assist family with belongings. Patient was transferred promptly to 89M, room 9. Engineer, structural of  the unit. [place] did not leave patient bedside until 89M, ([place], Therapist, sports) was comfort with transfer.

## 2015-07-31 NOTE — Progress Notes (Signed)
This is a no charge note  Need consultation per PA, Dan in rehab.  60 year old lady with past medical history of lung mass, mitral valve endocarditis, splenic infarct, currently on Rocephin. Pt is doing rehabilitation due to large right MCA stroke currently. Last night, the patient developed fever and oxygen desaturated to 50, which improved to 97 on nasal cannula. Chest x-ray showed basilar infiltration which is similar to previous chest x-ray. No BMP since 07/27/15. No CBC since 07/25/15. ABG showed pH of 7.16, PCO2 58.6, PO2 120. will do sepsis workup, IV fluid, broaden antibiotics to IV vancomycin and Zosyn, get urinalysis, urine culture and blood culture. Pt will be transferred to 3M09.   Ivor Costa, MD  [facility] Pager [phone]  If 7PM-7AM, please contact night-coverage [email or web address] Password [email or web address] 07/31/2015, 5:52 AM

## 2015-07-31 NOTE — Progress Notes (Signed)
Inpatient Rehabilitation  Patient returned to acute as of 07/31/15.  During rehab stay family's plan for discharge changed to SNF.  IP Rehab CSW was seeking authorization and placement.  Informed acute CSW.    Carmelia Roller., CCC/SLP Admission Coordinator  [place]  Cell [phone]

## 2015-07-31 NOTE — Consult Note (Signed)
PULMONARY / CRITICAL CARE MEDICINE   Name: Becky Patterson MRN: [id number] DOB: 10/16/1955    ADMISSION DATE:  07/31/2015 CONSULTATION DATE:  3/9  REFERRING MD:  Elgergawy   CHIEF COMPLAINT:  Septic shock   HISTORY OF PRESENT ILLNESS:   60 year old female patient with complex recent history. 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 She was readmitted by the neurology service to the ICU on 2/5 with acute left-sided weakness with slurred speech and left side neglect. Urine drug screen was positive for THC. Additional evaluation revealed an M1 occlusion with attempted revascularization of the right MCA occlusion by Dr. Estanislado Pandy. She required multiple fluid challenges secondary to hypotension. She also developed a drug reaction rash possibly related to fluoroquinolone. ID was consulted and switched patient to carbapenem and recommended repeating echocardiogram when antibiotics were completed. Follow-up CT revealed a large posterior inferior right MCA infarct with evolution but no hemorrhagic conversion. MRI of the brain demonstrated a large right MCA infarct with local mass effect on right lateral ventricle and mild right uncal herniation as well as nonhemorrhagic left subinsular/perioperculum infarct. During her acute phase she did require mechanical ventilation and was eventually extubated on 2/8. Swallowing evaluation after that demonstrated dysphagia and she subsequently has been stable in the D3 diet with thickened liquids. Her rash had resolved and ID switch the patient back to Rocephin 2 g IV every 12 hours. She was admitted to the rehabilitation unit on 07/07/15.   As of 3/8 patient had been having an otherwise uneventful course in rehabilitation. 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 Her diet had also been upgraded to a nectar thick as opposed to honey thick. Overnight into this morning (3/9) the patient awakened during her sleep complaining of feeling cold. She was found to have a fever greater than 102F. She was tachycardic with a heart rate of 144 and respirations 34 the blood pressure 117/51. O2 saturations had dropped to 50% on room air but improved to 97% on nasal cannula. A stat chest x-ray obtained showed a stable right basilar infiltrate. Stat ABG revealed metabolic acidemia with pH of 7.16, PCO2 of 58.6, PO2 102 bicarbonate of 19.8 and acid base deficit of 7. Due to hypoxemia she was placed on BiPAP. Was felt the patient may be expressing a septic process so orders were placed (sepsis focused) and an order was also given to discharge patient from rehabilitation and admit to SDU.   Once in the SDU her abx were widened, and she was given multiple IVF boluses to equal 1m/kg. Her lactic acid  Cleared from 3-->2.3. In spite of this she remained hypotensive so PCCM was asked to eval.     PAST MEDICAL HISTORY :  She  has a past medical history of Lung mass; Bacterial endocarditis; Splenic infarction; and Epistaxis.  PAST SURGICAL HISTORY: She  has past surgical history that includes Back surgery; TEE without cardioversion (N/A, 06/24/2015); Radiology with anesthesia (N/A, 06/29/2015); and Cholecystectomy.  Allergies  Allergen Reactions  . Codeine Nausea And Vomiting    No current facility-administered medications on file prior to encounter.   Current Outpatient Prescriptions on File Prior to Encounter  Medication Sig  . acetaminophen (TYLENOL)  500 MG tablet Take 1,000 mg by mouth 2 (two) times daily as needed for mild pain or headache.  . ferrous sulfate 325 (65 FE) MG tablet Take 1 tablet (325 mg total) by mouth 2 (two) times daily with a  meal.  . furosemide (LASIX) 40 MG tablet Take 1 tablet (40 mg total) by mouth daily.  . Hydrocodone-Acetaminophen 5-300 MG TABS Take 1 tablet by mouth every 6 (six) hours as needed (for pain). Reported on 06/15/2015  . naproxen sodium (ANAPROX) 220 MG tablet Take 440 mg by mouth 2 (two) times daily as needed (for pain).  . promethazine (PHENERGAN) 6.25 MG/5ML syrup Take 10 mLs (12.5 mg total) by mouth every 6 (six) hours as needed for nausea or vomiting.  . traMADol (ULTRAM) 50 MG tablet Take 1-2 tablets (50-100 mg total) by mouth every 6 (six) hours as needed for moderate pain.    FAMILY HISTORY:  Her has no family status information on file.   SOCIAL HISTORY: She  reports that she has been smoking.  She does not have any smokeless tobacco history on file. She reports that she does not drink alcohol or use illicit drugs.  REVIEW OF SYSTEMS:   Will not participate   SUBJECTIVE:  No distress.   VITAL SIGNS: BP 73/46 mmHg  Pulse 90  Temp(Src) 99.4 F (37.4 C) (Axillary)  Resp 21  SpO2 100%  HEMODYNAMICS:    VENTILATOR SETTINGS: Vent Mode:  [-]  FiO2 (%):  [40 %-50 %] 40 %  INTAKE / OUTPUT: I/O last 3 completed shifts: In: 2500 [IV Piggyback:2500] Out: -   PHYSICAL EXAMINATION: General:  60 year old white female, lying in bed. Not in distress but remains hypotensive after fluid resuscitation efforts.  Neuro:  Awake, alert, has left sided paresthesia and hemiparesis +/- element of neglect. C/o left shoulder pain  HEENT:  NCAT, MM are moist, there is no JVD Cardiovascular:  Loud systolic murmur other wise rrr  Lungs:  Clear and w/out accessory muscle use  Abdomen:  Soft, no OM, + bowel sounds  Musculoskeletal:  Left sided weakness/hemiparesis good strength on right  Skin:  Warm, dry and intact   LABS:  BMET  Recent Labs Lab 07/27/15 0829 07/28/15 0546 07/31/15 0515 07/31/15 0630  NA 136  --  137 138  K 3.8  --  3.4* 3.6  CL 98*  --  102 103  CO2 26  --  21* 23   BUN 16  --  16 16  CREATININE 0.75 0.77 0.85 0.86  GLUCOSE 135*  --  216* 141*    Electrolytes  Recent Labs Lab 07/27/15 0829 07/31/15 0515 07/31/15 0630  CALCIUM 9.6 9.3 9.5    CBC  Recent Labs Lab 07/25/15 0520 07/31/15 0515 07/31/15 0630  WBC 9.3 8.1 13.9*  HGB 8.9* 10.1* 9.8*  HCT 29.6* 31.8* 30.8*  PLT 381 364 334    Coag's  Recent Labs Lab 07/31/15 0630  APTT 32  INR 1.46    Sepsis Markers  Recent Labs Lab 07/31/15 0532 07/31/15 0630 07/31/15 0700 07/31/15 0822  LATICACIDVEN 3.0*  --  2.4* 2.3*  PROCALCITON  --  4.53  --   --     ABG  Recent Labs Lab 07/31/15 0436  PHART 7.169*  PCO2ART 58.6*  PO2ART 120*    Liver Enzymes  Recent Labs Lab 07/27/15 0829 07/31/15 0630  AST 31 33  ALT 21 20  ALKPHOS 103 127*  BILITOT 0.7 0.8  ALBUMIN 2.9*  2.8*    Cardiac Enzymes No results for input(s): TROPONINI, PROBNP in the last 168 hours.  Glucose  Recent Labs Lab 07/25/15 2100 07/26/15 0655 07/26/15 1147 07/26/15 1638 07/26/15 2102 07/27/15 0650  GLUCAP 139* 144* 113* 128* 132* 110*    Imaging Dg Chest Port 1 View  07/31/2015  CLINICAL DATA:  Shortness of breath.  Desaturation tonight. EXAM: PORTABLE CHEST 1 VIEW COMPARISON:  07/20/2015 FINDINGS: Cardiac enlargement with mild pulmonary vascular congestion. Infiltration or edema in the lung bases similar to previous study. Left lower lung nodule measuring 2 cm diameter has been present on previous studies. No blunting of costophrenic angles. No pneumothorax. Right PICC line with tip over the low SVC. Old left rib fracture. IMPRESSION: Cardiac enlargement with pulmonary vascular congestion. Infiltration or edema in the lung bases similar to previous studies. Persistent lung nodule on the left measuring 2 cm diameter. Electronically Signed   By: Lucienne Capers M.D.   On: 07/31/2015 05:06     STUDIES:  Echo 3/9>>>  CULTURES: BCX2 1/22: enterobacter; subsequent BCs on 2/5, 2/27  cleared on rocephin.  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 Had been in rehab on rocephin and making progress. Awoke w/ chills and hypotensive. Did not respond to IVF challenge. New septic shock 3/8-->source to be determined. Potential sources include: Urinary source (dirty urine), infected PICC, recurrent aspiration (although right basilar infiltrate actually looks improved), or more remotely consider septic arthritis (c/o left shoulder pain).   ASSESSMENT / PLAN:  PULMONARY A: Acute HCRF Right basilar atx P:   Trend pulse ox Dc any narcotics   CARDIOVASCULAR A:  Septic shock. S/p 48m/kg, remains hypotensive  Loud systolic murmur P:  Transduce CVP Hemodynamic goals: CVP 8-12, MAP >65 Will use crystalloids and pressors as indicated.  Tele ECHO given loud murmur   RENAL A:   + anion gap metabolic acidosis  Lactic acidosis -->in setting of septic shock. 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 He re-affirmed DNR status. Is ok w/ short term pressors and plan as outlined above.   - Inter-disciplinary family meet or Palliative Care meeting due by: 3/16  PErick ColaceACNP-BC LVelora Heckler  Pulmonary/Critical Care Pager # [phone] OR # [phone] if no answer   07/31/2015, 11:20 AM

## 2015-07-31 NOTE — Progress Notes (Signed)
eLink Physician-Brief Progress Note Patient Name: Becky Patterson DOB: 1956-03-28 MRN: [id number]   Date of Service  07/31/2015  HPI/Events of Note  Can recheck on patient on BiPAP. Appears comfortable. Family at bedside. Vitals stable. Blood pressure stable at marginal. Patient had mild hypotension following bolus of Precedex requiring initiation of low-dose Neo-Synephrine.Good urine output after IV Lasix.Chest x-ray does suggest mild pulmonary edema bilaterally.  eICU Interventions  1. Gradual tapering of Precedex to minimize hypotension 2. Continuing BiPAP support. 3. Continue close monitoring in the intensive care unit.     Intervention Category Major Interventions: Respiratory failure - evaluation and management;Delirium, psychosis, severe agitation - evaluation and management  Tera Partridge 07/31/2015, 9:53 PM

## 2015-07-31 NOTE — H&P (Signed)
[facility] History and Physical                                                                                    Becky Patterson, is a 60 y.o. female  MRN: [id number]   DOB - 12-13-1955  Admit Date - 07/31/2015  Outpatient Primary MD for the patient is No primary care provider on file.  Referring MD: Letta Pate / Inpatient Rehab  Consulting M.D: Megan Salon / ID  PMH: Past Medical History  Diagnosis Date  . Lung mass   . Bacterial endocarditis   . Splenic infarction   . Epistaxis       PSH: Past Surgical History  Procedure Laterality Date  . Back surgery    . Tee without cardioversion N/A 06/24/2015    Procedure: TRANSESOPHAGEAL ECHOCARDIOGRAM (TEE);  Surgeon: Larey Dresser, MD;  Location: [place];  Service: Cardiovascular;  Laterality: N/A;  . Radiology with anesthesia N/A 06/29/2015    Procedure: RADIOLOGY WITH ANESTHESIA;  Surgeon: Luanne Bras, MD;  Location: [place];  Service: Radiology;  Laterality: N/A;  . Cholecystectomy       CC: Sepsis with hypotension    HPI: This a 60 year old female patient with complex recent history. 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 She was readmitted by the neurology service to the ICU on 2/5 with acute left-sided weakness with slurred speech and left side neglect. Urine drug screen was positive for THC. Additional evaluation revealed an M1 occlusion with attempted revascularization of the right MCA occlusion by Dr. Estanislado Pandy. She required multiple fluid challenges secondary to hypotension. She also developed a drug reaction rash possibly related to fluoroquinolone. ID was consulted and switched patient to carbapenem and recommended repeating echocardiogram when antibiotics were completed. Follow-up CT revealed a large  posterior inferior right MCA infarct with evolution but no hemorrhagic conversion. MRI of the brain demonstrated a large right MCA infarct with local mass effect on right lateral ventricle and mild right uncal herniation as well as nonhemorrhagic left subinsular/perioperculum infarct. During her acute phase she did require mechanical ventilation and was eventually extubated on 2/8. Swallowing evaluation after that demonstrated dysphagia and she subsequently has been stable in the D3 diet with thickened liquids. Her rash had resolved and ID switch the patient back to Rocephin 2 g IV every 12 hours. She was admitted to the rehabilitation unit on 07/07/15.   As of 3/8 patient had been having an otherwise uneventful course in rehabilitation. 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 Her diet had also been upgraded to a nectar thick as opposed to honey thick. Overnight into this morning (3/9) the patient awakened during her sleep complaining of feeling cold. She was found to have a fever greater than 102F. She was tachycardic with a heart rate of 144 and respirations 34 the blood pressure 117/51. O2 saturations had dropped to 50%  on room air but improved to 97% on nasal cannula. A stat chest x-ray obtained showed a stable right basilar infiltrate. Stat ABG revealed metabolic acidemia with pH of 7.16, PCO2 of 58.6, PO2 102 bicarbonate of 19.8 and acid base deficit of 7. Due to hypoxemia she was placed on BiPAP. Was felt the patient may be expressing a septic process so orders were placed (sepsis focused) and an order was also given to discharge patient from rehabilitation and admit to level.   Review of Systems   Unable to obtain from patient secondary to altered mentation. Sr. and Kyung Rudd providing history as well as history obtained from ICU nurse. Patient had been given some  Ativan around 5 AM prior to transfer.  Social History Social History  Substance Use Topics  . Smoking status: Current Every Day Smoker  . Smokeless tobacco: Not on file  . Alcohol Use: No    Resides at: Private residence prior to most recent admission  Lives with: Spouse  Ambulatory status: Rolling walker with max assist 2   Family History Family History  Problem Relation Age of Onset  . Lung cancer Mother   . Liver cancer Brother   . Heart attack Brother      Prior to Admission medications   Medication Sig Start Date End Date Taking? Authorizing Provider  acetaminophen (TYLENOL) 500 MG tablet Take 1,000 mg by mouth 2 (two) times daily as needed for mild pain or headache.    Historical Provider, MD  ferrous sulfate 325 (65 FE) MG tablet Take 1 tablet (325 mg total) by mouth 2 (two) times daily with a meal. 06/26/15   Nita Sells, MD  furosemide (LASIX) 40 MG tablet Take 1 tablet (40 mg total) by mouth daily. 06/26/15   Nita Sells, MD  Hydrocodone-Acetaminophen 5-300 MG TABS Take 1 tablet by mouth every 6 (six) hours as needed (for pain). Reported on 06/15/2015 06/08/15   Historical Provider, MD  naproxen sodium (ANAPROX) 220 MG tablet Take 440 mg by mouth 2 (two) times daily as needed (for pain).    Historical Provider, MD  promethazine (PHENERGAN) 6.25 MG/5ML syrup Take 10 mLs (12.5 mg total) by mouth every 6 (six) hours as needed for nausea or vomiting. 06/26/15   Nita Sells, MD  traMADol (ULTRAM) 50 MG tablet Take 1-2 tablets (50-100 mg total) by mouth every 6 (six) hours as needed for moderate pain. 06/26/15   Nita Sells, MD    Allergies  Allergen Reactions  . Codeine Nausea And Vomiting    Physical Exam  Vitals  Blood pressure 97/54, pulse 108, temperature 99.4 F (37.4 C), temperature source Axillary, resp. rate 23, SpO2 99 %.   General: Appears acutely ill and toxic  Psych: Lethargic in setting of hypotension in recent  administration of benzodiazepine  Neuro: Patient unable to participate secondary to altered mentation-she does have known left sided hemiplegia secondary to recent stroke  ENT:  Ears and Eyes appear Normal, Conjunctivae clear, PER. Dry oral mucosa without erythema or exudates.  Neck:  Supple, No lymphadenopathy appreciated  Respiratory:  Symmetrical chest wall movement, Good air movement bilaterally in setting of BiPAP, coarse to auscultation, 40% FiO2 with oxygen saturations 100%  Cardiac:  RRR and remains tachycardic despite having defervesced from recent fever, hypotensive with MAP between 54 and 55, No Murmurs, no LE edema noted, no JVD, No carotid bruits, peripheral pulses palpable at 2+  Abdomen:  Positive bowel sounds, Soft, Non tender, Non distended,  No masses appreciated, no  obvious hepatosplenomegaly  Genitourinary: Incontinent of urine-order given to insert Foley  Skin:  No Cyanosis, Normal Skin Turgor, No Skin Rash or Bruise. Slightly diminished capillary refill in nail beds but nearly 2 seconds-extremities warm to the touch and actually flushed  Extremities: Symmetrical without obvious trauma or injury,  no effusions.  Data Review  CBC  Recent Labs Lab 07/25/15 0520 07/31/15 0515 07/31/15 0630  WBC 9.3 8.1 13.9*  HGB 8.9* 10.1* 9.8*  HCT 29.6* 31.8* 30.8*  PLT 381 364 334  MCV 93.7 94.4 93.6  MCH 28.2 30.0 29.8  MCHC 30.1 31.8 31.8  RDW 15.6* 14.9 15.0  LYMPHSABS  --  0.8 0.4*  MONOABS  --  0.0* 0.1  EOSABS  --  0.1 0.0  BASOSABS  --  0.0 0.0    Chemistries   Recent Labs Lab 07/25/15 0520 07/27/15 0829 07/28/15 0546 07/31/15 0515 07/31/15 0630  NA 136 136  --  137 138  K 3.9 3.8  --  3.4* 3.6  CL 99* 98*  --  102 103  CO2 25 26  --  21* 23  GLUCOSE 129* 135*  --  216* 141*  BUN 11 16  --  16 16  CREATININE 0.79 0.75 0.77 0.85 0.86  CALCIUM 9.7 9.6  --  9.3 9.5  AST  --  31  --   --  33  ALT  --  21  --   --  20  ALKPHOS  --  103  --   --   127*  BILITOT  --  0.7  --   --  0.8    estimated creatinine clearance is 65.4 mL/min (by C-G formula based on Cr of 0.86).  No results for input(s): TSH, T4TOTAL, T3FREE, THYROIDAB in the last 72 hours.  Invalid input(s): FREET3  Coagulation profile  Recent Labs Lab 07/31/15 0630  INR 1.46    No results for input(s): DDIMER in the last 72 hours.  Cardiac Enzymes No results for input(s): CKMB, TROPONINI, MYOGLOBIN in the last 168 hours.  Invalid input(s): CK  Invalid input(s): POCBNP  Urinalysis    Component Value Date/Time   COLORURINE YELLOW 06/30/2015 0100   APPEARANCEUR CLEAR 06/30/2015 0100   LABSPEC 1.017 06/30/2015 0100   PHURINE 6.5 06/30/2015 0100   GLUCOSEU NEGATIVE 06/30/2015 0100   HGBUR NEGATIVE 06/30/2015 0100   BILIRUBINUR NEGATIVE 06/30/2015 0100   KETONESUR NEGATIVE 06/30/2015 0100   PROTEINUR NEGATIVE 06/30/2015 0100   NITRITE NEGATIVE 06/30/2015 0100   LEUKOCYTESUR SMALL* 06/30/2015 0100    Imaging results:   Ct Head Wo Contrast  07/02/2015  CLINICAL DATA:  Follow-up examination for stroke. EXAM: CT HEAD WITHOUT CONTRAST TECHNIQUE: Contiguous axial images were obtained from the base of the skull through the vertex without intravenous contrast. COMPARISON:  Prior CT from 06/29/2014 as well as prior MRI from 06/30/2014. FINDINGS: Extensive cytotoxic edema seen throughout the right MCA territory, consistent with large acute right MCA territory infarct. Again, there is involvement of the right frontal, right parietal, and right temporal lobes, with involvement of the basal ganglia. Overall, distribution is stable from recent MRI. No evidence for hemorrhagic transformation on this examination. Persistent hyperdensity within the right M1 segment and right MCA at the sylvian fissure likely representing thrombosis. There is localized edema with partial effacement of the right lateral ventricle, similar to prior MRI. Mild bowing of the septum pellucidum from  right-to-left measures up to 4 mm. Right uncal herniation, similar to previous. Basilar cisterns  minimally crowded at the level of the right uncus, but otherwise patent. No hydrocephalus or evidence for ventricular trapping. Cytotoxic edema within the anterior insular/subinsular region on the left also similar relative to previous MRI. No significant mass effect or evidence for hemorrhagic transformation. No new infarct.  No extra-axial fluid collection. No acute abnormality about the orbits. Scalp soft tissues within normal limits. Mild mucosal thickening within the ethmoidal air cells, maxillary sinuses, and sphenoid sinuses. Paranasal sinuses are otherwise clear. Trace opacity within the right mastoid air cells. Calvarium intact. IMPRESSION: 1. Continued interval evolution of large ischemic right MCA territory infarct, overall stable in size and distribution relative to recent MRI. No evidence for hemorrhagic transformation. Localized mass effect with effacement of the right lateral ventricle, right uncal herniation, and 4 mm of right-to-left shift present. No hydrocephalus or evidence for ventricular trapping. 2. Stable size of smaller left anterior insular/subinsular ischemic infarct. No evidence for associated hemorrhage or mass effect. 3. No other acute intracranial process. Electronically Signed   By: Jeannine Boga M.D.   On: 07/02/2015 04:35   Mr Brain Wo Contrast  07/01/2015  CLINICAL DATA:  60 year old female with infective endocarditis on treatment with sudden onset of left-sided weakness. Post angiogram and endovascular treatment for right internal carotid artery terminus occlusion. Subsequent encounter. EXAM: MRI HEAD WITHOUT CONTRAST TECHNIQUE: Multiplanar, multiecho pulse sequences of the brain and surrounding structures were obtained without intravenous contrast. COMPARISON:  06/30/2015 head CT.  No comparison brain MR. FINDINGS: Exam is motion degraded. Large acute right middle cerebral  artery distribution infarct involving portions the right temporal lobe, right frontal lobe, right parietal lobe, operculum region, right basal ganglia and right corona radiata with local mass effect upon the right lateral ventricle with slight bowing of the septum to the left by 2 mm. On gradient sequence no associated blood however, slight T1 hyperintensity within the right lenticular nucleus and subtle blood at this level therefore not excluded. Mild right uncal herniation Nonhemorrhagic acute anterior left subinsular/peri operculum infarct. Mild small vessel disease changes. No intracranial mass lesion noted on this unenhanced exam. Cerebellar tonsils slightly low lying but within the range of normal limits. Decreased signal intensity of bone marrow may be related to patient's habitus or anemia. At the level of the sella, major intracranial vascular structures are patent. Limited evaluation right middle cerebral artery branches. IMPRESSION: Large acute right middle cerebral artery distribution infarct involving portions the right temporal lobe, right frontal lobe, right parietal lobe, operculum region, right basal ganglia and right corona radiata with local mass effect upon the right lateral ventricle with slight bowing of the septum to the left by 2 mm. On gradient sequence no associated blood however, slight T1 hyperintensity within the right lenticular nucleus and subtle blood at this level therefore not excluded. Mild right uncal herniation. Nonhemorrhagic acute anterior left subinsular/peri operculum infarct. Electronically Signed   By: Genia Del M.D.   On: 07/01/2015 16:34   Mr Jeri Cos XB Contrast  07/22/2015  CLINICAL DATA:  Right-sided headache. 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 Unsuccessful thrombectomy. EXAM: MRI HEAD WITHOUT AND WITH CONTRAST TECHNIQUE: Multiplanar, multiecho pulse sequences of the brain and surrounding  structures were obtained without and with intravenous contrast. CONTRAST:  4m MULTIHANCE GADOBENATE DIMEGLUMINE 529 MG/ML IV SOLN COMPARISON:  MR head 07/01/2015. FINDINGS: The patient was unable to remain motionless for the exam. Small or subtle lesions could be overlooked. Sequelae of previous RIGHT MCA territory  infarction affecting much of the temporal lobe, basal ganglia, frontal operculum, and anterior parietal lobes, now with pseudonormalization of much of the involved territory. Residual diffusion restriction within the periventricular white matter, lentiform nucleus, and adjacent deep white matter, follows an expected time course of superficial greater than deep normalization. No significant reperfusion hemorrhage. Minor T1 shortening in the RIGHT insula representing either laminar necrosis or gyriform tight petechial hemorrhage. Pseudonormalization of LEFT insular acute infarction. Post infusion, gyriform enhancement predominates. There are no features of enhancement to suggest brain abscess or meningitis. There does not appear to be significant recanalization of the RIGHT MCA or RIGHT ICA, based on lack of flow void. IMPRESSION: Expected time course of large RIGHT MCA territory and smaller LEFT MCA territory infarcts status post septic emboli to the brain with large vessel occlusion. No imaging features concerning for meningitis or brain abscess. Electronically Signed   By: Staci Righter M.D.   On: 07/22/2015 19:37   Dg Chest Port 1 View  07/31/2015  CLINICAL DATA:  Shortness of breath.  Desaturation tonight. EXAM: PORTABLE CHEST 1 VIEW COMPARISON:  07/20/2015 FINDINGS: Cardiac enlargement with mild pulmonary vascular congestion. Infiltration or edema in the lung bases similar to previous study. Left lower lung nodule measuring 2 cm diameter has been present on previous studies. No blunting of costophrenic angles. No pneumothorax. Right PICC line with tip over the low SVC. Old left rib fracture.  IMPRESSION: Cardiac enlargement with pulmonary vascular congestion. Infiltration or edema in the lung bases similar to previous studies. Persistent lung nodule on the left measuring 2 cm diameter. Electronically Signed   By: Lucienne Capers M.D.   On: 07/31/2015 05:06   Dg Chest Port 1 View  07/20/2015  CLINICAL DATA:  Hypoxia EXAM: PORTABLE CHEST 1 VIEW COMPARISON:  07/03/2015 FINDINGS: Cardiac shadow is stable. A right-sided PICC line is again seen in the distal superior vena cava. Increasing right medial lung base infiltrate is noted. Atelectatic changes are noted in the left retrocardiac region. IMPRESSION: Increasing right medial lung base infiltrate. Electronically Signed   By: Inez Catalina M.D.   On: 07/20/2015 21:27   Dg Chest Port 1 View  07/03/2015  CLINICAL DATA:  Endotracheal tube. EXAM: PORTABLE CHEST 1 VIEW COMPARISON:  07/02/2015 FINDINGS: Endotracheal tube not visualized and has presumably been removed. A right-sided PICC line terminates at the mid to low SVC. Midline trachea. Cardiomegaly accentuated by AP portable technique. No pleural effusion or pneumothorax. Improved to resolved interstitial edema. Pulmonary interstitial prominence is favored to be related to AP portable technique. Similar bibasilar atelectasis. IMPRESSION: Endotracheal tube not visualized and has presumably been removed. Cardiomegaly with mild bibasilar atelectasis. Electronically Signed   By: Abigail Miyamoto M.D.   On: 07/03/2015 07:54   Dg Chest Port 1 View  07/02/2015  CLINICAL DATA:  Endotracheal tube EXAM: PORTABLE CHEST 1 VIEW COMPARISON:  None. FINDINGS: Endotracheal 9 cm from carina. Consider advancement by 2-3 cm. Stable enlarged cardiac silhouette. There is central venous congestion similar prior. No pneumothorax. IMPRESSION: Endotracheal tube 9 cm from carina.  Consider advancement by 2-3 cm. Central venous congestion Electronically Signed   By: Suzy Bouchard M.D.   On: 07/02/2015 07:43   Dg Swallowing  Func-speech Pathology  07/15/2015  Objective Swallowing Evaluation: Type of Study: MBS-Modified Barium Swallow Study Patient Details Name: VANITY LARSSON MRN: [id number] Date of Birth: Aug 18, 1955 Today's Date: 07/15/2015 Time: SLP Start Time (ACUTE ONLY): 1340 SLP Stop Time (ACUTE ONLY): 1410 SLP Time Calculation (min) (ACUTE  ONLY): 30 min Past Medical History: Past Medical History Diagnosis Date . Lung mass  . Bacterial endocarditis  . Splenic infarction  . Epistaxis  Past Surgical History: Past Surgical History Procedure Laterality Date . Back surgery   . Tee without cardioversion N/A 06/24/2015   Procedure: TRANSESOPHAGEAL ECHOCARDIOGRAM (TEE);  Surgeon: Larey Dresser, MD;  Location: [facility];  Service: Cardiovascular;  Laterality: N/A; . Radiology with anesthesia N/A 06/29/2015   Procedure: RADIOLOGY WITH ANESTHESIA;  Surgeon: Luanne Bras, MD;  Location: [place] [facility];  Service: Radiology;  Laterality: N/A; . Cholecystectomy   HPI: JACQUELINA HEWINS is a 60 y.o. female history of infective endocarditis that was treated with discharge on 2/2, continuing on antibiotics at home, lung mass noted on that admission. Re-admitted with left sided weakness that progressed after admission. 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  Pt admitted to CIR on 07/07/15.  Tolerating Dys 3, honey thick liquids diet.  Repeat MBS ordered to objectively determine readiness to advance given improvements noted at bedside.  No Data Recorded Assessment / Plan / Recommendation CHL IP CLINICAL IMPRESSIONS 07/15/2015 Therapy Diagnosis Mild pharyngeal phase dysphagia;Mild oral phase dysphagia Clinical Impression Pt presents with improved swallowing function and now presents with a mild oropharyngeal dysphagia with sensory>motor deficits.  Pt presents with left sided labial and lingual weakness which results in anterior loss of boluses, impaired mastication of solids, and prolonged posterior transit of boluses to the  oropharynx.  Decreased pharyngeal sensation and base of tongue weakness resulted in poor bolus cohesion and premature spillage of materials into the pharynx with swallow response triggered at the vallecula with thickened liquids and solids, delayed to the pyriforms with thin liquids.  Delay in swallow initiation resulted in penetration of large boluses of nectar thick liquids and thin liquids.  Penetration of thin liquids was greater in amount and occurred deeper into the laryngeal vestibule, requiring increased cues for throat clear and extra swallows to clear.  Chin tuck ineffective largely due to cognitive deficits impacting pt's ability to successfully time strategy during the swallow.   Given pt's significant cognitive deficits, limited mobility, and persistent lethargy, recommend conservative advancement to Dys 3, nectar thick liquids with full supervision for use of swallowing precautions.  Pt would also benefit from implementation of the water protocol to continue working towards liquids advancement.  Prognosis for advancement good given progress made thus far with continued ST interventions for trials of advanced consistencies and use of compensatory swallowing strategies.   Impact on safety and function Mild aspiration risk     Prognosis 07/03/2015 Prognosis for Safe Diet Advancement Good Barriers to Reach Goals -- Barriers/Prognosis Comment -- CHL IP DIET RECOMMENDATION 07/15/2015 SLP Diet Recommendations Dysphagia 3 (Mech soft) solids;Nectar thick liquid Liquid Administration via Cup;No straw Medication Administration Crushed with puree Compensations Slow rate;Small sips/bites;Lingual sweep for clearance of pocketing Postural Changes Seated upright at 90 degrees            CHL IP ORAL PHASE 07/15/2015 Oral Phase Impaired Oral - Pudding Teaspoon -- Oral - Pudding Cup -- Oral - Honey Teaspoon -- Oral - Honey Cup Left anterior bolus loss;Delayed oral transit;Decreased bolus cohesion;Premature  spillage;Lingual/palatal residue Oral - Nectar Teaspoon -- Oral - Nectar Cup Left anterior bolus loss;Delayed oral transit;Lingual/palatal residue;Decreased bolus cohesion;Premature spillage Oral - Nectar Straw Lingual/palatal residue;Decreased bolus cohesion;Premature spillage Oral - Thin Teaspoon -- Oral - Thin Cup Left anterior bolus loss;Lingual/palatal residue;Delayed oral transit;Decreased bolus cohesion;Premature spillage Oral - Thin Straw Lingual/palatal residue;Decreased  bolus cohesion;Premature spillage Oral - Puree Lingual/palatal residue;Delayed oral transit;Premature spillage Oral - Mech Soft Lingual/palatal residue;Premature spillage;Delayed oral transit;Impaired mastication Oral - Regular -- Oral - Multi-Consistency -- Oral - Pill -- Oral Phase - Comment --  CHL IP PHARYNGEAL PHASE 07/15/2015 Pharyngeal Phase Impaired Pharyngeal- Pudding Teaspoon -- Pharyngeal -- Pharyngeal- Pudding Cup -- Pharyngeal -- Pharyngeal- Honey Teaspoon -- Pharyngeal -- Pharyngeal- Honey Cup Delayed swallow initiation-vallecula;Reduced tongue base retraction;Pharyngeal residue - valleculae Pharyngeal -- Pharyngeal- Nectar Teaspoon -- Pharyngeal -- Pharyngeal- Nectar Cup Delayed swallow initiation-vallecula;Reduced tongue base retraction;Pharyngeal residue - valleculae;Penetration/Aspiration during swallow Pharyngeal Material enters airway, remains ABOVE vocal cords then ejected out Pharyngeal- Nectar Straw -- Pharyngeal -- Pharyngeal- Thin Teaspoon -- Pharyngeal -- Pharyngeal- Thin Cup Delayed swallow initiation-pyriform sinuses;Reduced tongue base retraction;Penetration/Aspiration during swallow;Pharyngeal residue - valleculae;Pharyngeal residue - pyriform Pharyngeal Material enters airway, remains ABOVE vocal cords and not ejected out Pharyngeal- Thin Straw Delayed swallow initiation-pyriform sinuses;Reduced tongue base retraction;Penetration/Aspiration during swallow;Pharyngeal residue - valleculae;Pharyngeal residue -  pyriform Pharyngeal Material enters airway, remains ABOVE vocal cords and not ejected out Pharyngeal- Puree Delayed swallow initiation-vallecula;Reduced tongue base retraction Pharyngeal -- Pharyngeal- Mechanical Soft Delayed swallow initiation-vallecula;Reduced tongue base retraction Pharyngeal -- Pharyngeal- Regular -- Pharyngeal -- Pharyngeal- Multi-consistency -- Pharyngeal -- Pharyngeal- Pill -- Pharyngeal -- Pharyngeal Comment --  CHL IP CERVICAL ESOPHAGEAL PHASE 07/03/2015 Cervical Esophageal Phase WFL Pudding Teaspoon -- Pudding Cup -- Honey Teaspoon -- Honey Cup -- Nectar Teaspoon -- Nectar Cup -- Nectar Straw -- Thin Teaspoon -- Thin Cup -- Thin Straw -- Puree -- Mechanical Soft -- Regular -- Multi-consistency -- Pill -- Cervical Esophageal Comment -- No flowsheet data found. Page, Selinda Orion 07/15/2015, 4:17 PM              Dg Swallowing Func-speech Pathology  07/03/2015  Objective Swallowing Evaluation: Type of Study: MBS-Modified Barium Swallow Study Patient Details Name: AZANI BROGDON MRN: [id number] Date of Birth: Oct 10, 1955 Today's Date: 07/03/2015 Time: SLP Start Time (ACUTE ONLY): 1346-SLP Stop Time (ACUTE ONLY): 1357 SLP Time Calculation (min) (ACUTE ONLY): 11 min Past Medical History: No past medical history on file. Past Surgical History: Past Surgical History Procedure Laterality Date . Back surgery   . Tee without cardioversion N/A 06/24/2015   Procedure: TRANSESOPHAGEAL ECHOCARDIOGRAM (TEE);  Surgeon: Larey Dresser, MD;  Location: [facility];  Service: Cardiovascular;  Laterality: N/A; . Radiology with anesthesia N/A 06/29/2015   Procedure: RADIOLOGY WITH ANESTHESIA;  Surgeon: Luanne Bras, MD;  Location: [facility];  Service: Radiology;  Laterality: N/A; HPI: REIANNA BATDORF is a 60 y.o. female history of infective endocarditis that was treated with discharge on 2/2, continuing on antibiotics at home, lung mass noted on that admission. Re-admitted with left sided weakness that progressed after  admission. 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 Combination of above leads to silent aspiration of liquids thinner than honey thick. Patient able to protect airway with solids and honey thick liquids provided with mild vallecular residuals remaining post swallow due to base of tongue weakness, clearing with cued dry swallow. Recommend initiation of conservative diet with close SLP f/u for tolerance and potential to advance which is good with improved alertness and neuro  functioning.  Impact on safety and function Moderate aspiration risk   CHL IP TREATMENT RECOMMENDATION 07/03/2015 Treatment Recommendations Therapy as outlined in treatment plan below   Prognosis 07/03/2015 Prognosis for Safe Diet Advancement Good Barriers to Reach Goals -- Barriers/Prognosis Comment -- CHL IP DIET RECOMMENDATION 07/03/2015 SLP Diet Recommendations Dysphagia 3 (Mech soft) solids;Honey thick liquids Liquid Administration via Cup;No straw Medication Administration Crushed with puree Compensations Slow rate;Small sips/bites;Multiple dry swallows after each bite/sip;Lingual sweep for clearance of pocketing Postural Changes Seated upright at 90 degrees   CHL IP OTHER RECOMMENDATIONS 07/03/2015 Recommended Consults -- Oral Care Recommendations Oral care BID Other Recommendations Order thickener from pharmacy;Prohibited food (jello, ice cream, thin soups);Remove water pitcher   CHL IP FOLLOW UP RECOMMENDATIONS 07/03/2015 Follow up Recommendations Inpatient Rehab   CHL IP FREQUENCY AND DURATION 07/03/2015 Speech Therapy  Frequency (ACUTE ONLY) min 3x week Treatment Duration 2 weeks      CHL IP ORAL PHASE 07/03/2015 Oral Phase Impaired Oral - Pudding Teaspoon -- Oral - Pudding Cup -- Oral - Honey Teaspoon Lingual/palatal residue;Delayed oral transit;Decreased bolus cohesion Oral - Honey Cup Lingual/palatal residue;Delayed oral transit;Decreased bolus cohesion Oral - Nectar Teaspoon Lingual/palatal residue;Delayed oral transit;Decreased bolus cohesion Oral - Nectar Cup -- Oral - Nectar Straw -- Oral - Thin Teaspoon Lingual/palatal residue;Delayed oral transit;Decreased bolus cohesion Oral - Thin Cup -- Oral - Thin Straw -- Oral - Puree Lingual/palatal residue;Delayed oral transit Oral - Mech Soft Lingual/palatal residue;Delayed oral transit Oral - Regular -- Oral - Multi-Consistency -- Oral - Pill -- Oral Phase - Comment --  CHL IP PHARYNGEAL PHASE 07/03/2015 Pharyngeal Phase Impaired Pharyngeal- Pudding Teaspoon -- Pharyngeal -- Pharyngeal- Pudding Cup -- Pharyngeal -- Pharyngeal- Honey Teaspoon Delayed swallow initiation-vallecula;Pharyngeal residue - valleculae;Reduced tongue base retraction Pharyngeal -- Pharyngeal- Honey Cup Delayed swallow initiation-vallecula;Pharyngeal residue - valleculae;Reduced tongue base retraction Pharyngeal -- Pharyngeal- Nectar Teaspoon Pharyngeal residue - valleculae;Reduced tongue base retraction;Delayed swallow initiation-pyriform sinuses;Penetration/Aspiration before swallow Pharyngeal Material enters airway, passes BELOW cords without attempt by patient to eject out (silent aspiration) Pharyngeal- Nectar Cup -- Pharyngeal -- Pharyngeal- Nectar Straw -- Pharyngeal -- Pharyngeal- Thin Teaspoon Pharyngeal residue - valleculae;Reduced tongue base retraction;Delayed swallow initiation-pyriform sinuses;Penetration/Aspiration before swallow Pharyngeal Material enters airway, passes BELOW cords without attempt by patient to eject out (silent aspiration) Pharyngeal- Thin Cup -- Pharyngeal -- Pharyngeal- Thin  Straw -- Pharyngeal -- Pharyngeal- Puree Delayed swallow initiation-vallecula;Pharyngeal residue - valleculae;Reduced tongue base retraction Pharyngeal -- Pharyngeal- Mechanical Soft Delayed swallow initiation-vallecula;Pharyngeal residue - valleculae;Reduced tongue base retraction Pharyngeal -- Pharyngeal- Regular -- Pharyngeal -- Pharyngeal- Multi-consistency -- Pharyngeal -- Pharyngeal- Pill -- Pharyngeal -- Pharyngeal Comment --  CHL IP CERVICAL ESOPHAGEAL PHASE 07/03/2015 Cervical Esophageal Phase WFL Pudding Teaspoon -- Pudding Cup -- Honey Teaspoon -- Honey Cup -- Nectar Teaspoon -- Nectar Cup -- Nectar Straw -- Thin Teaspoon -- Thin Cup -- Thin Straw -- Puree -- Mechanical Soft -- Regular -- Multi-consistency -- Pill -- Cervical Esophageal Comment -- No flowsheet data found. Gabriel Rainwater MA, CCC-SLP [phone] [place] Leah Meryl 07/03/2015, 2:51 PM                Assessment & Plan  Principal Problem:   Sepsis with hypotension  -Uncertain etiology noting patient was hypoxic with high fevers which is suggestive of influenza although she could have early HCAP vs aspiration pneumonitis vs UTI-her sister did report patient had been complaining of urinary frequency for the past several days -Admit to ICU -Following sepsis criteria have been met: Fever 102, heart  rate 111 despite correction of fever, persistent tachypnea, hypotension with an MAP less than 65, a suspected source of infection, elevated lactate at 3, elevated Procalcitonin, leukocytosis 13,900 and early coagulopathy with a PT of 17.8 although INR and PTT are normal, she also has hyperglycemia -Continue sepsis protocol  -volume resuscitation in process-if remains hypotensive after having received 30 mL/kg (2400 cc) will need to consult PCCM for short-term pressor therapy-patient is DO NOT RESUSCITATE but may have an easily reversible cause for he sepsis presentation -Follow up on blood and urine cultures  -Lactate has decreased from 3.0 to 2.4  after partial volume resuscitation and improvement in oxygenation-continue to trend -PCT elevated at 4.53 -Have broadened antibiotics to Primaxin and vancomycin  Active Problems:   Acute respiratory failure with hypoxia  -Etiology unclear-differential as above -Continue supportive care with oxygen and pulmonary toileting -Influenza PCR    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 -Have reconsult ID insetting of recurrent fever -It appears patient had at least 2 more weeks of Rocephin 2 g IV every 12 hours to complete treatment -Had drug reaction rash likely due to Cipro -Because of recurrent fever and previous recommendations from ID Rocephin has been discontinued in favor of Primaxin    Mass of lower lobe of left lung/history of tobacco abuse -Suspected to be a hamartoma and outpatient PET scan apparently has been scheduled    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, post-stroke -Once hemodynamics have improved and patient more alert we'll need to resume PT/OT-oval can return to rehabilitation unit -NPO for now-previously on D3 diet with honey thickened liquids    Severe mitral regurgitation -Previously evaluated by cardiothoracic surgery and because of multiple comorbidities and recent stroke determined not to be a candidate for mitral valve replacement surgery    Hyperglycemia -During initial stroke evaluation hemoglobin A1c found to be elevated at 6.9 -CBGs mildly elevated so we'll continue to follow and provide SSI if indicated    Anemia of chronic disease -Hemoglobin recently has ranged between 8.1 and 8.9 -Most recent reading earlier this morning before volume resuscitation was 10.1 likely reflective of hemoconcentration -Continue to follow -Iron was found to be profile below 1/22 at 6 so we'll repeat anemia panel in a.m. in case the patient needs additional iron administration -Check TSH         DVT Prophylaxis:  Lovenox  Family Communication: Sister at bedside  Code Status:  DO NOT RESUSCITATE  Condition:  Guarded and unstable  Discharge disposition: If survives current sepsis and hypotension presentation likely will need to return to rehabilitative therapy and then disposition at their discretion-anticipate patient may eventually end up in skilled nursing facility if does not make a rapid turnaround during this admission  Time spent in minutes : 60      ELLIS,ALLISON L. ANP on 07/31/2015 at 8:06 AM  You may contact me by going to [email or web address] - password [email or web address]  I am available from 7a-7p but please confirm I am on the schedule by going to Amion as above.   After 7p please contact night coverage person covering me after hours  [facility]

## 2015-07-31 NOTE — Consult Note (Signed)
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 antiseptic oral rinse  7 mL Mouth Rinse q12n4p  . chlorhexidine  15 mL Mouth Rinse BID  . enoxaparin (LOVENOX) injection  40 mg Subcutaneous Q24H  . imipenem-cilastatin  500 mg Intravenous Q8H  . insulin aspart  0-9 Units Subcutaneous 6 times per day  . sodium chloride flush  3 mL Intravenous Q12H  . vancomycin  1,000 mg Intravenous Q12H    Recommendations: 1. Continue vancomycin and imipenem pending further observation and culture results    Assessment: So far, the cause of her new onset fever and sepsis is unclear. Her respiratory distress, wheezing and hypoxia suggest the possibility of pneumonia although this was not evident this morning or on repeat chest x-ray. She may have a PICC associated infection or UTI. I agree with continuing broader empiric antibiotic therapy for now. I will follow-up tomorrow. I discussed the situation with her daughter and husband.   HPI: Becky Patterson is a 60 y.o. 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 She was treated with ciprofloxacin with a plan to continue it for 6 weeks through 07/29/2015. However, she developed a large, embolic right middle cerebral  artery stroke and was readmitted on 06/29/2015. She developed a fever and rash around that time is felt to be due to ciprofloxacin and her therapy was changed to ceftriaxone. She slowly improved and was transferred to the rehabilitation unit. Her therapy was extended for a full 8 weeks with a plan to continue it through 08/11/2015. She was doing well until early this morning when she developed respiratory distress, hypoxia, wheezing and a temperature of 102.2. She was transferred here. After cultures were obtained her antibiotic therapy was empirically broadened to vancomycin and imipenem. Her UA shows too numerous to count white blood cells and bacteria. A Foley catheter has been placed. Her old PICC line has been removed and she has a new left IJ central line. Her chest x-ray shows stable cardiomegaly and vascular congestion. There are no obvious new infiltrates. She is not having any acute diarrhea. She has had some left shoulder pain for the past several weeks.   Review of Systems: Review of Systems  Constitutional: Positive for fever and malaise/fatigue. Negative for chills, weight loss and diaphoresis.  HENT: Negative for sore throat.   Respiratory: Positive for cough and wheezing. Negative for sputum production and shortness of breath.        Her chronic smoker's cough is unchanged.  Cardiovascular: Negative for chest pain.  Gastrointestinal: Negative for nausea, vomiting, abdominal pain and diarrhea.  Genitourinary: Negative for dysuria.  Musculoskeletal: Positive for joint pain. Negative for myalgias.  Skin: Negative for rash.  Neurological: Positive for focal weakness. Negative for headaches.    Past Medical History  Diagnosis Date  . Lung mass   . Bacterial endocarditis   . Splenic infarction   . Epistaxis     Social History  Substance Use Topics  . Smoking status: Current Every Day Smoker  . Smokeless tobacco: Not on file  . Alcohol Use: No    Family History  Problem  Relation Age of Onset  . Lung cancer Mother   . Liver cancer Brother   . Heart attack Brother    Allergies  Allergen Reactions  . Codeine Nausea And Vomiting    OBJECTIVE: Blood pressure 75/41, pulse 97, temperature 97.4 F (36.3 C), temperature source Oral, resp. rate 20, SpO2 100 %.  Physical Exam  Constitutional: She is oriented to person, place, and time.  She is alert and in no distress. Her daughter is at the bedside.  Eyes: Conjunctivae are normal.  Cardiovascular: Normal rate and regular rhythm.   No murmur heard. Pulmonary/Chest: Breath sounds normal. She has no wheezes. She has no rales.  Abdominal: Soft. She exhibits no mass. There is no tenderness.  Musculoskeletal:  There is no redness, swelling or warmth of her left shoulder.  Neurological: She is alert and oriented to person, place, and time.  Skin: No rash noted.  Her right arm PICC has been removed.  Psychiatric:  She has a flat affect.    Lab Results Lab Results  Component Value Date   WBC 13.9* 07/31/2015   HGB 9.8* 07/31/2015   HCT 30.8* 07/31/2015   MCV 93.6 07/31/2015   PLT 334 07/31/2015    Lab Results  Component Value Date   CREATININE 0.86 07/31/2015   BUN 16 07/31/2015   NA 138 07/31/2015   K 3.6 07/31/2015   CL 103 07/31/2015   CO2 23 07/31/2015    Lab Results  Component Value Date   ALT 20 07/31/2015   AST 33 07/31/2015   ALKPHOS 127* 07/31/2015   BILITOT 0.8 07/31/2015     Microbiology: Recent Results (from the past 240 hour(s))  Culture, blood (Routine X 2) w Reflex to ID Panel     Status: None   Collection Time: 07/21/15  6:19 PM  Result Value Ref Range Status   Specimen Description BLOOD LEFT ANTECUBITAL  Final   Special Requests BOTTLES DRAWN AEROBIC ONLY [place]  Final   Culture NO GROWTH 5 DAYS  Final   Report Status 07/26/2015 FINAL  Final  Culture, blood (Routine X 2) w Reflex to ID Panel     Status: None   Collection Time: 07/21/15  6:28 PM  Result Value Ref Range  Status   Specimen Description BLOOD LEFT FOREARM  Final   Special Requests BOTTLES DRAWN AEROBIC AND ANAEROBIC 5CC  Final   Culture NO GROWTH 5 DAYS  Final   Report Status 07/26/2015 FINAL  Final  MRSA PCR Screening     Status: None   Collection Time: 07/31/15  6:25 AM  Result Value Ref Range Status   MRSA by PCR NEGATIVE NEGATIVE Final    Comment:        The GeneXpert MRSA Assay (FDA approved for NASAL specimens only), is one component of a comprehensive MRSA colonization surveillance program. It is not intended to diagnose MRSA infection nor to guide or monitor treatment for MRSA infections.   Culture,  blood (routine x 2)     Status: None (Preliminary result)   Collection Time: 07/31/15  7:00 AM  Result Value Ref Range Status   Specimen Description BLOOD LEFT ARM  Final   Special Requests BOTTLES DRAWN AEROBIC AND ANAEROBIC 10CC  Final   Culture PENDING  Incomplete   Report Status PENDING  Incomplete  Culture, blood (routine x 2)     Status: None (Preliminary result)   Collection Time: 07/31/15  7:10 AM  Result Value Ref Range Status   Specimen Description BLOOD LEFT HAND  Final   Special Requests BOTTLES DRAWN AEROBIC AND ANAEROBIC [place]  Final   Culture PENDING  Incomplete   Report Status PENDING  Incomplete  Culture, blood (x 2)     Status: None (Preliminary result)   Collection Time: 07/31/15  8:30 AM  Result Value Ref Range Status   Specimen Description BLOOD LEFT ARM  Final   Special Requests IN PEDIATRIC BOTTLE 3CC  Final   Culture PENDING  Incomplete   Report Status PENDING  Incomplete  Culture, blood (x 2)     Status: None (Preliminary result)   Collection Time: 07/31/15  8:34 AM  Result Value Ref Range Status   Specimen Description BLOOD LEFT HAND  Final   Special Requests IN PEDIATRIC BOTTLE [place]  Final   Culture PENDING  Incomplete   Report Status PENDING  Incomplete    Michel Bickers, MD [facility] [phone] pager   [phone] cell 07/31/2015, 1:22 PM

## 2015-07-31 NOTE — Progress Notes (Addendum)
PICC line right basilic was placed 2/7 raising the possibility of potential bacteremia/line sepsis. Patient's blood pressure remains labile; did briefly rise to 90 systolic but has once again decreased with MAP <65. ABG improved on BiPAP so we'll attempt to utilize a Ventimask 40%. Has an additional 500 mL fluid challenge remaining. Patient husband at bedside and updated on status. He wishes to continue DO NOT RESUSCITATE but would like short-term pressors if indicated.  1015: Has completed volume resuscitation and remains hypotensive at 80/46 with MAP 58. Discussed with on call PCCM physician who will see the pt in consultation  UA concerning for UTI  Erin Hearing ANP

## 2015-07-31 NOTE — Progress Notes (Signed)
Pt transferred to acute due to medical issues early am. Was working on [employer] and facility is [employer]. Eustaquio Maize was working on [employer]. Have contacted [employer] to inform of transfer to acute. Questions regarding discharge please contact this worker.

## 2015-07-31 NOTE — Progress Notes (Signed)
eLink Physician-Brief Progress Note Patient Name: Becky Patterson DOB: 03-06-1956 MRN: [id number]   Date of Service  07/31/2015  HPI/Events of Note  Camera check on patient now with persistent hypertension and anxiety on Precedex infusion and BiPAP support. Patient is tachycardic. Mild to moderately increased work of breathing on BiPAP. Patient is DO NOT RESUSCITATE. Suspect some element of flash pulmonary edema. Bedside nurse reports patient does have coarse rhonchorous breath sounds bilaterally without wheezing.  eICU Interventions  1. Bolus of Precedex infusion according to ideal body weight 2. Lasix 40 mg IV 1 now 3. Nicardipine infusion for goal systolic blood pressure [phone]     Intervention Category Major Interventions: Respiratory failure - evaluation and management;Change in mental status - evaluation and management;Hypertension - evaluation and management  Tera Partridge 07/31/2015, 6:54 PM

## 2015-07-31 NOTE — Progress Notes (Addendum)
Call received per floor RN at [phone] regarding Pt with acute SOB with 02 sats 70s% on 2 LNC. Advised RN to place on 6 LNC and increase to NRB if no improvement while RRT en route. Arrived to bedsdie at 0418, Pt found on VM 50% 02 sats 50-55%. RR 35-40, wheezes heard on expiration, severely anxious. HR 150s, BP 115/83. Pt placed on NRB with 02 sats improving to 80s%, Xopenex NEB  treatment given STAT per protocol with some improvement. ABG and CXR orders placed, RT at bedside and ABG completed STAT. Po2 sats improved to 96-98% on NRB. [facility] paged and updated per floor RN, en route to hospital. Rectal temp obtained yielding 102.2. ABG resulted Ph 7.16 Co2 58.6 Po2 120 Bicarb 19.8.   At 0455 Pt WOB improved but still labored, Bipap  Placed per RT. ABG results called to P.A, Ativan 0.5 mg IV ordered and given for anxiety. P.A at bedside 0505 to evaluate Pt, labs ordered and drawn from PICC line. [facility] consulted and Pt to transfer to 3 M 09 as SDU overflow Pt to ICU. Respiratory Therapist at bedside to monitor Bipap  until transfer at 0605. Pt left resting on 15M with improved WOB, care assumed by 3 M RN, report provided by [employer] RN.

## 2015-07-31 NOTE — Progress Notes (Signed)
[employer] [id number] patient member called for help. Arrived in the room patient was noted with  SOB. Encouraged patient to start breathing orders were check for breathing treatment. Noted uses of accessory muscles to breath, continued to encourage to assist patient trying to pace breathing. O2 sat assessed 85%, administered 4L N/C. Sat started to inclined, but immediately started to declined. Rapid response and charged nurse was notified. Patient O2 declined to 75%. Charge nurse arrived to room. Rapid response Jerene Pitch, RN) called delegated to administered 6L, patient not improving. Rapid

## 2015-07-31 NOTE — Progress Notes (Signed)
[facility] Progress Note Patient Name: Becky Patterson DOB: [birth year] MRN: [id number]   Date of Service  07/31/2015  HPI/Events of Note  Camera check on patient reporting a headache that she rates as 5/10. Patient more awake and alert this afternoon on Levophed infusion per nurse report. Patient is reporting some hunger and thirst as well. Per documentation recently cleared for dysphagia 3 diet.  eICU Interventions  1. Switched to nothing by mouth except for meds 2. Tylenol by mouth when necessary     Intervention Category Intermediate Interventions: Pain - evaluation and management  Tera Partridge 07/31/2015, 5:40 PM

## 2015-08-01 ENCOUNTER — Inpatient Hospital Stay (HOSPITAL_COMMUNITY): Payer: 59

## 2015-08-01 DIAGNOSIS — J9601 Acute respiratory failure with hypoxia: Secondary | ICD-10-CM

## 2015-08-01 DIAGNOSIS — A419 Sepsis, unspecified organism: Secondary | ICD-10-CM

## 2015-08-01 DIAGNOSIS — I340 Nonrheumatic mitral (valve) insufficiency: Secondary | ICD-10-CM

## 2015-08-01 LAB — GLUCOSE, CAPILLARY
GLUCOSE-CAPILLARY: 170 mg/dL — AB (ref 65–99)
GLUCOSE-CAPILLARY: 103 mg/dL — AB (ref 65–99)
GLUCOSE-CAPILLARY: 104 mg/dL — AB (ref 65–99)
Glucose-Capillary: 108 mg/dL — ABNORMAL HIGH (ref 65–99)
Glucose-Capillary: 109 mg/dL — ABNORMAL HIGH (ref 65–99)
Glucose-Capillary: 165 mg/dL — ABNORMAL HIGH (ref 65–99)
Glucose-Capillary: 216 mg/dL — ABNORMAL HIGH (ref 65–99)

## 2015-08-01 LAB — FERRITIN: FERRITIN: 308 ng/mL — AB (ref 11–307)

## 2015-08-01 LAB — COMPREHENSIVE METABOLIC PANEL
ALT: 20 U/L (ref 14–54)
ANION GAP: 13 (ref 5–15)
AST: 28 U/L (ref 15–41)
Albumin: 2.7 g/dL — ABNORMAL LOW (ref 3.5–5.0)
Alkaline Phosphatase: 117 U/L (ref 38–126)
BILIRUBIN TOTAL: 0.8 mg/dL (ref 0.3–1.2)
BUN: 12 mg/dL (ref 6–20)
CHLORIDE: 104 mmol/L (ref 101–111)
CO2: 24 mmol/L (ref 22–32)
Calcium: 9 mg/dL (ref 8.9–10.3)
Creatinine, Ser: 0.71 mg/dL (ref 0.44–1.00)
Glucose, Bld: 167 mg/dL — ABNORMAL HIGH (ref 65–99)
POTASSIUM: 3.6 mmol/L (ref 3.5–5.1)
Sodium: 141 mmol/L (ref 135–145)
TOTAL PROTEIN: 6.4 g/dL — AB (ref 6.5–8.1)

## 2015-08-01 LAB — URINE CULTURE

## 2015-08-01 LAB — ECHOCARDIOGRAM COMPLETE: WEIGHTICAEL: 2987.67 [oz_av]

## 2015-08-01 LAB — CBC
HEMATOCRIT: 27.4 % — AB (ref 36.0–46.0)
Hemoglobin: 8.3 g/dL — ABNORMAL LOW (ref 12.0–15.0)
MCH: 28 pg (ref 26.0–34.0)
MCHC: 30.3 g/dL (ref 30.0–36.0)
MCV: 92.6 fL (ref 78.0–100.0)
Platelets: 375 10*3/uL (ref 150–400)
RBC: 2.96 MIL/uL — AB (ref 3.87–5.11)
RDW: 15 % (ref 11.5–15.5)
WBC: 18.9 10*3/uL — AB (ref 4.0–10.5)

## 2015-08-01 LAB — IRON AND TIBC
IRON: 13 ug/dL — AB (ref 28–170)
Saturation Ratios: 5 % — ABNORMAL LOW (ref 10.4–31.8)
TIBC: 249 ug/dL — AB (ref 250–450)
UIBC: 236 ug/dL

## 2015-08-01 LAB — RETICULOCYTES
RBC.: 2.96 MIL/uL — AB (ref 3.87–5.11)
RETIC COUNT ABSOLUTE: 59.2 10*3/uL (ref 19.0–186.0)
Retic Ct Pct: 2 % (ref 0.4–3.1)

## 2015-08-01 LAB — VITAMIN B12: Vitamin B-12: 632 pg/mL (ref 180–914)

## 2015-08-01 LAB — FOLATE: FOLATE: 17.2 ng/mL (ref >5.9)

## 2015-08-01 NOTE — Progress Notes (Signed)
PULMONARY / CRITICAL CARE MEDICINE   Name: Becky Patterson MRN: [id number] DOB: March 16, 1956    ADMISSION DATE:  07/31/2015 CONSULTATION DATE:  3/9  REFERRING MD:  Elgergawy   CHIEF COMPLAINT:  Septic shock   HISTORY OF PRESENT ILLNESS:   60 year old female patient with complex recent history. 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 Course was complicated by a large right MCA infarct/left hemiplegia. She was transferred to rehabilitation on 2/13 and was recovering well, able to stand with support. She was transferred to the ICU with fever and hypotension and tachycardia, ABG showing acute respiratory acidosis and required BiPAP. She remained hypotensive and hence [facility] M consulted, lactate cleared from 3-2.3  SIGNIFICANT EVENTS: 3/9 >> resp distress- bipap -- diuresed with lasix , precedex for agitation  SUBJECTIVE:  Event snoted Now down to venti mask Awake , c/o hot on left side Good UO with lasix  VITAL SIGNS: BP 108/57 mmHg  Pulse 80  Temp(Src) 97.4 F (36.3 C) (Axillary)  Resp 21  Wt 186 lb 11.7 oz (84.7 kg)  SpO2 99%  HEMODYNAMICS: CVP:  [7 mmHg-27 mmHg] 12 mmHg  VENTILATOR SETTINGS: Vent Mode:  [-]  FiO2 (%):  [40 %] 40 %  INTAKE / OUTPUT: I/O last 3 completed shifts: In: 5767.7 [I.V.:2317.7; IV Piggyback:3450] Out: 2150 [Urine:2150]  PHYSICAL EXAMINATION: General:  white female, lying in bed. Neuro:  Awake, alert, has left sided paresthesia and hemiparesis +/- element of neglect. C/o left shoulder pain  HEENT:  NCAT, MM are moist, there is no JVD Cardiovascular:  Loud systolic murmur other wise rrr  Lungs:  Clear and w/out accessory muscle use  Abdomen:  Soft, no OM, + bowel sounds  Musculoskeletal:  Left sided weakness/hemiparesis good strength on right  Skin:  Warm, dry and intact   LABS:  BMET  Recent  Labs Lab 07/31/15 0515 07/31/15 0630 08/01/15 0500  NA 137 138 141  K 3.4* 3.6 3.6  CL 102 103 104  CO2 21* 23 24  BUN '16 16 12  '$ CREATININE 0.85 0.86 0.71  GLUCOSE 216* 141* 167*    Electrolytes  Recent Labs Lab 07/31/15 0515 07/31/15 0630 08/01/15 0500  CALCIUM 9.3 9.5 9.0    CBC  Recent Labs Lab 07/31/15 0515 07/31/15 0630 08/01/15 0500  WBC 8.1 13.9* 18.9*  HGB 10.1* 9.8* 8.3*  HCT 31.8* 30.8* 27.4*  PLT 364 334 375    Coag's  Recent Labs Lab 07/31/15 0630  APTT 32  INR 1.46    Sepsis Markers  Recent Labs Lab 07/31/15 0630  07/31/15 0822 07/31/15 1323 07/31/15 1741  LATICACIDVEN  --   < > 2.3* 1.1 0.9  PROCALCITON 4.53  --   --   --   --   < > = values in this interval not displayed.  ABG  Recent Labs Lab 07/31/15 0436 07/31/15 0829  PHART 7.169* 7.373  PCO2ART 58.6* 40.7  PO2ART 120* 127.0*    Liver Enzymes  Recent Labs Lab 07/27/15 0829 07/31/15 0630 08/01/15 0500  AST 31 33 28  ALT '21 20 20  '$ ALKPHOS 103 127* 117  BILITOT 0.7 0.8 0.8  ALBUMIN 2.9* 2.8* 2.7*    Cardiac Enzymes No results for input(s): TROPONINI, PROBNP in the last 168 hours.  Glucose  Recent Labs Lab 07/31/15 1343 07/31/15 1650 07/31/15 1941 07/31/15 2345 08/01/15 0332 08/01/15 0726  GLUCAP  130* 132* 232* 216* 165* 170*    Imaging Portable Chest 1 View  08/01/2015  CLINICAL DATA:  Sepsis EXAM: PORTABLE CHEST 1 VIEW COMPARISON:  07/31/2015 FINDINGS: Left jugular line is again identified and stable in appearance. The cardiac shadow is stable. Bilateral pleural effusions are noted. Rounded nodular density is again noted in the left mid lung. This is stable from multiple prior exams. No new focal abnormality is seen. IMPRESSION: No significant interval change from the prior exam. Electronically Signed   By: Inez Catalina M.D.   On: 08/01/2015 07:59   Dg Chest Port 1 View  07/31/2015  CLINICAL DATA:  Acute respiratory failure with hypoxia.  Respiratory distress for 1 day. EXAM: PORTABLE CHEST 1 VIEW COMPARISON:  Radiograph earlier this day 1203 hour FINDINGS: The right upper extremity PICC has been removed. Left central line remains in place, tip in the region of the proximal SVC. Cardiomegaly is unchanged. Likely diminishing pleural effusions with diminishing obscuration of both diaphragms. Vascular congestion, perihilar and infrahilar edema, similar to prior. No pneumothorax. No new airspace disease. Left lung nodule is again seen. IMPRESSION: 1. Improving basilar aeration with diminishing pleural effusions. Vascular congestion and pulmonary edema, similar to prior exams. 2. Right upper extremity PICC has been removed. Left central line remains in place. Electronically Signed   By: Jeb Levering M.D.   On: 07/31/2015 18:54   Dg Chest Port 1 View  07/31/2015  CLINICAL DATA:  Central line placement EXAM: PORTABLE CHEST 1 VIEW COMPARISON:  07/31/2015 FINDINGS: Cardiomediastinal silhouette is stable. Persistent central vascular congestion and mild perihilar and infrahilar interstitial edema. Small bilateral pleural effusion with bilateral basilar atelectasis or infiltrate. Nodular density left lower lobe again noted. Stable right arm PICC line position. There is new left IJ central line with tip in SVC. No pneumothorax. IMPRESSION: New left IJ central line with tip in SVC. No pneumothorax. Stable right arm PICC line position. Persistent mild congestion/ edema. Again noted small bilateral pleural effusion with bilateral basilar atelectasis on infiltrate. Electronically Signed   By: Lahoma Crocker M.D.   On: 07/31/2015 12:38     STUDIES:  Echo 3/9>>> Echo 2/27 >> severe MR  CULTURES: BCX2 1/22: enterobacter; subsequent BCs on 2/5, 2/27 cleared on rocephin.  3/9: BCX2>>> GPC >> 3/9 UC>>> 3/9 PICC culture.>>>  ANTIBIOTICS: vanc 3/9>>> Zosyn 3/9>>>     LINES/TUBES: PICC from rehab>>removed 3/9 Left IJ CVL 3/9>>>  DISCUSSION: Recent  enterobacter endocarditis 1/22 w/ resultant septic emboli and CVA. Had been in rehab on rocephin and making progress. Awoke w/ chills and hypotensive. Did not respond to IVF challenge. New septic shock 3/8-->source to be determined. Potential sources include: Urinary source (dirty urine), infected PICC, recurrent aspiration (although right basilar infiltrate actually looks improved), or more remotely consider septic arthritis (c/o left shoulder pain).   ASSESSMENT / PLAN:  PULMONARY A: Acute hypercarbic resp failure ? Due to acute pulm edema Right basilar atx P:   Trend pulse ox Dc any narcotics Bipap prn   CARDIOVASCULAR A:  Septic shock.  Severe MR P:  Hemodynamic goals: CVP 8-12, MAP >65 Dc IVFs Tele Rpt ECHO given loud murmur   RENAL A:   Lactic acidosis -->resolved P:   Hold further lasix  GASTROINTESTINAL A:   Dysphagia -->recently cleared for dysp 3 diet  P:   Resume dys 3 diet   HEMATOLOGIC A:   Anemia of critical illness P:  Cont [place] LMWH Trend CBC Transfuse per ICU protocol  INFECTIOUS A:   Recent enterobacter endocarditis 1/22 New septic shock 3/8-->source to be determined. Potential sources include: Urinary source (dirty urine), infected PICC, recurrent aspiration (although right basilar infiltrate actually looks improved), or more remotely consider septic arthritis (c/o left shoulder pain) P:   Pan culture abx widened DC'd PICC and culture tip   ENDOCRINE A:   No acute    P:   Trend glucose   NEUROLOGIC A:   Subacute right MCA embolic CVA in setting of endocarditis w/ residual left hemiparesis, left sided neglect, paresthesia and dysphagia  P:   RASS goal: 0 Rehab once off pressors   FAMILY  - Updates: husband & son  updated at bedside. He re-affirmed DNR status. Is ok w/ short term pressors and plan as outlined above.   - Inter-disciplinary family meet or Palliative Care meeting due by: 3/16   Summary - Episode of acute pulmonary  edema related to severe MR -improved Septic shock GPC - likely from line, improving Not a candidate for MVR  The patient is critically ill with multiple organ systems failure and requires high complexity decision making for assessment and support, frequent evaluation and titration of therapies, application of advanced monitoring technologies and extensive interpretation of multiple databases. Critical Care Time devoted to patient care services described in this note independent of APP time is 32 minutes.   Kara Mead MD. [place] Flood. [place] [facility] Pager [phone] If no response call [phone]    08/01/2015, 9:35 AM

## 2015-08-01 NOTE — Clinical Social Work Placement (Signed)
   CLINICAL SOCIAL WORK PLACEMENT  NOTE  Date:  08/01/2015  Patient Details  Name: Becky Patterson MRN: [id number] Date of Birth: [birth year]  Clinical Social Work is seeking post-discharge placement for this patient at the [place] level of care (*CSW will initial, date and re-position this form in  chart as items are completed):  Yes   Patient/family provided with [place] Work Department's list of facilities offering this level of care within the geographic area requested by the patient (or if unable, by the patient's family).  Yes   Patient/family informed of their freedom to choose among providers that offer the needed level of care, that participate in Medicare, Medicaid or managed care program needed by the patient, have an available bed and are willing to accept the patient.  Yes   Patient/family informed of [facility]'s ownership interest in [facility] and [facility], as well as of the fact that they are under no obligation to receive care at these facilities.  PASRR submitted to EDS on       PASRR number received on       Existing PASRR number confirmed on 08/01/15     FL2 transmitted to all facilities in geographic area requested by pt/family on 08/01/15     FL2 transmitted to all facilities within larger geographic area on       Patient informed that his/her managed care company has contracts with or will negotiate with certain facilities, including the following:            Patient/family informed of bed offers received.  Patient chooses bed at       Physician recommends and patient chooses bed at      Patient to be transferred to   on  .  Patient to be transferred to facility by       Patient family notified on   of transfer.  Name of family member notified:        PHYSICIAN Please sign FL2     Additional Comment:    Barbette Or, [place]

## 2015-08-01 NOTE — Clinical Social Work Note (Signed)
Clinical Social Work Assessment  Patient Details  Name: Becky Patterson MRN: [id number] Date of Birth: 1955-08-12  Date of referral:  08/01/15               Reason for consult:  Facility Placement                Permission sought to share information with:  Family Supports Permission granted to share information::  Yes, Verbal Permission Granted  Name::     Cyndal Kasson  Relationship::  Husband  Contact Information:  [phone]  Housing/Transportation Living arrangements for the past 2 months:  [place] [facility]) Source of Information:  Spouse Patient Interpreter Needed:  None Criminal Activity/Legal Involvement Pertinent to Current Situation/Hospitalization:  No - Comment as needed Significant Relationships:  Spouse, Siblings, Adult Children Lives with:  Facility Resident Do you feel safe going back to the place where you live?  No Need for family participation in patient care:  Yes (Comment)  Care giving concerns:  Patient husband states that he would prefer patient to return to inpatient rehab prior to SNF placement, however he is understanding that patient is not appropriate for return to rehab prior to SNF placement.   Social Worker assessment / plan:  Holiday representative met with patient husband at bedside to offer support and discuss patient needs at discharge.  Patient husband states that patient was at [facility] prior to this hospitalization, however they were working with CIR CSW to get placement at [place].  CSW has completed updated FL2 and initiated a new referral to the facility.  Patient currently remains on the venturi mask and not appropriate for discharge at this time.  CSW remains available for support and to facilitate patient discharge needs once medically stable.  Employment status:  Software engineer:  Managed Care PT Recommendations:  [place], Inpatient Rehab  Consult Information / Referral to community resources:  [place]  Patient/Family's Response to care:  Patient husband verbalized understanding for CSW role and appreciation for support and concern.  Patient husband able to fully understand patient need for SNF placement at discharge.  Patient husband just hopeful for [facility].  Patient/Family's Understanding of and Emotional Response to Diagnosis, Current Treatment, and Prognosis:  Patient husband seems to have a good understanding and grasp of patient current needs and treatment plan.  Patient husband appropriate coping with patient illness and hopeful for recovery.  Emotional Assessment Appearance:  Appears older than stated age Attitude/Demeanor/Rapport:  Unable to Assess Affect (typically observed):  Unable to Assess Orientation:  Oriented to Self, Oriented to Place Alcohol / Substance use:  Not Applicable Psych involvement (Current and /or in the community):  No (Comment)  Discharge Needs  Concerns to be addressed:  No discharge needs identified Readmission within the last 30 days:  Yes Current discharge risk:  None Barriers to Discharge:  Continued Medical Work up  [employer], [place]

## 2015-08-01 NOTE — Progress Notes (Signed)
CRITICAL VALUE ALERT  Critical value received:  Positive blood culture - anaerobic bottle, gram negative rods  Date of notification:  08/01/2015  Time of notification: 0413  Critical value read back:Yes.    Nurse who received alert:  Therese Sarah, RN    MD notified (1st page):  Warren Lacy, Dr Deterding  Time of first page:  [phone]  Responding MD:  Warren Lacy, Dr Deterding   Time MD responded:  2549  Notified Elink MD regarding + blood culture notification. No new orders given. Will continue to monitor.

## 2015-08-01 NOTE — Discharge Summary (Signed)
Physician Discharge Summary  Patient ID: Becky Patterson MRN: [id number] DOB/AGE: May 11, 1956 60 y.o.  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 .   Significant Diagnostic Studies: Mr Kizzie Fantasia Contrast  18-Aug-2015  CLINICAL DATA:  Right-sided headache. 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 Unsuccessful thrombectomy. EXAM: MRI HEAD WITHOUT AND WITH CONTRAST TECHNIQUE: Multiplanar, multiecho pulse sequences of the brain and surrounding structures were obtained without and with intravenous contrast. CONTRAST:  109m MULTIHANCE GADOBENATE DIMEGLUMINE 529 MG/ML IV SOLN COMPARISON:  MR head 07/01/2015. FINDINGS: The patient was unable to remain motionless for the exam. Small or subtle lesions could be overlooked. Sequelae of previous RIGHT MCA territory infarction affecting much of the temporal lobe, basal ganglia, frontal operculum, and anterior parietal lobes, now with pseudonormalization of much of the involved territory. Residual diffusion restriction within the periventricular white matter, lentiform nucleus, and adjacent deep white matter, follows an expected time course of superficial greater than deep normalization. No significant reperfusion hemorrhage. Minor T1 shortening in the RIGHT insula representing either laminar necrosis or gyriform tight petechial hemorrhage. Pseudonormalization of LEFT insular acute infarction. Post infusion, gyriform enhancement predominates. There are no features of enhancement to suggest brain  abscess or meningitis. There does not appear to be significant recanalization of the RIGHT MCA or RIGHT ICA, based on lack of flow void. IMPRESSION: Expected time course of large RIGHT MCA territory and smaller LEFT MCA territory infarcts status post septic emboli to the brain with large vessel occlusion. No imaging features concerning for meningitis or brain abscess. Electronically Signed   By: JStaci RighterM.D.   On: 0Mar 27, 201719:37   Portable Chest 1 View  08/01/2015  CLINICAL DATA:  Sepsis EXAM: PORTABLE CHEST 1 VIEW COMPARISON:  07/31/2015 FINDINGS: Left jugular line is again identified and stable in appearance. The cardiac shadow is stable. Bilateral pleural effusions are noted. Rounded nodular density is again noted in the left mid lung. This is stable from multiple prior exams. No new focal abnormality is seen. IMPRESSION: No significant interval change from the prior exam. Electronically Signed   By: MInez CatalinaM.D.   On: 08/01/2015 07:59   Dg Chest Port 1 View  07/31/2015  CLINICAL DATA:  Acute respiratory failure with hypoxia. Respiratory distress for 1 day. EXAM: PORTABLE CHEST 1 VIEW COMPARISON:  Radiograph earlier this day 1203 hour FINDINGS: The right upper extremity PICC has been removed. Left central line remains in place, tip in the region of the proximal SVC. Cardiomegaly is unchanged. Likely diminishing pleural effusions with diminishing obscuration of both diaphragms. Vascular congestion, perihilar and infrahilar edema, similar to prior. No pneumothorax. No new airspace disease. Left lung nodule is again seen. IMPRESSION: 1. Improving basilar aeration with diminishing pleural effusions. Vascular congestion and pulmonary edema, similar to prior exams. 2. Right upper extremity PICC has been removed. Left central line remains in place. Electronically Signed   By: MJeb LeveringM.D.   On: 07/31/2015 18:54   Dg Chest Port 1 View  07/31/2015  CLINICAL DATA:  Central line placement EXAM:  PORTABLE CHEST  1 VIEW COMPARISON:  07/31/2015 FINDINGS: Cardiomediastinal silhouette is stable. Persistent central vascular congestion and mild perihilar and infrahilar interstitial edema. Small bilateral pleural effusion with bilateral basilar atelectasis or infiltrate. Nodular density left lower lobe again noted. Stable right arm PICC line position. There is new left IJ central line with tip in SVC. No pneumothorax. IMPRESSION: New left IJ central line with tip in SVC. No pneumothorax. Stable right arm PICC line position. Persistent mild congestion/ edema. Again noted small bilateral pleural effusion with bilateral basilar atelectasis on infiltrate. Electronically Signed   By: Lahoma Crocker M.D.   On: 07/31/2015 12:38   Dg Chest Port 1 View  07/31/2015  CLINICAL DATA:  Shortness of breath.  Desaturation tonight. EXAM: PORTABLE CHEST 1 VIEW COMPARISON:  07/20/2015 FINDINGS: Cardiac enlargement with mild pulmonary vascular congestion. Infiltration or edema in the lung bases similar to previous study. Left lower lung nodule measuring 2 cm diameter has been present on previous studies. No blunting of costophrenic angles. No pneumothorax. Right PICC line with tip over the low SVC. Old left rib fracture. IMPRESSION: Cardiac enlargement with pulmonary vascular congestion. Infiltration or edema in the lung bases similar to previous studies. Persistent lung nodule on the left measuring 2 cm diameter. Electronically Signed   By: Lucienne Capers M.D.   On: 07/31/2015 05:06    Labs:  Basic Metabolic Panel:  Recent Labs Lab 07/27/15 0829 07/28/15 0546  NA 136  --   K 3.8  --   CL 98*  --   CO2 26  --   GLUCOSE 135*  --   BUN 16  --   CREATININE 0.75 0.77  CALCIUM 9.6  --        Brief HPI:   Becky Patterson is a 60 y.o. 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 She was admitted on 06/29/15 with  acute onset of left sided weakness with slurred speech and left neglect. UDS positive for THC.  CTA brain showed M1 occlusion and Dr. Estanislado Pandy attempted revascularization of the right MCA occlusion with Solitare and IA Integrelin.  She developed diffuse rash question due reaction from transfusion v/s antibiotics. Dr. Baxter Flattery recommended switching to carbapenem and repeat TTE at end of course of antibiotics. Follow up CCT was negative for hemorrhage and MRI brain showed large R-MCA infarct with local mass effect on right lateral ventricle and mild right uncal herniation as well as non hemorrhagic left subinsular/ perioperculum infarct. Dr Leonie Man felt that strokes were due to septic emboli and no antithrombotic due to risk of bleeding. Maculopapular Rash had resolved and she was switched back to Ceftriaxone for additional 6 weeks.  Patient with resultant left neglect with right gaze preference, dense left hemiparesis with sensory deficits, cognitive deficits and dysphagia. CIR was recommended for follow up therapy   Hospital Course: Becky Patterson was admitted to rehab 07/07/2015 for inpatient therapies to consist of PT, ST and OT at least three hours five days a week. Past admission physiatrist, therapy team and rehab RN have worked together to provide customized collaborative inpatient rehab.   She was maintained on IV antibiotics for treatment of enterobacter endocarditis.  She was maintained on SQ lovenox for DVT prophylaxis. She continued to have issues with post stroke headaches but developed itching due to percocet therefore this was changed to tramadol prn and  Topamax was added for more consistent relief.    She  had poor po intake on dysphagia diet with honey liquids and required IVF for hydration. Megace was added to help with poor intake and diet has slowly improved with advanced to more regular textures with water protocol. Routine labs were ordered to monitor hydration status and potassium supplement  was increased to treat hypokalemia. Anemia is stable and stool guaiacs X 3 were negative. She was noted to develop clonus in left foot and PRAFO and WHO was ordered to help preserve ROM.  As headaches improved, Topamax was discontinued on 02/22.   Cardiology was consulted due to SOB with hypoxia as well as elevated troponin. They felt that this was due to demand ischemia from tachycardia and SOB was related to anxiety. Dr Tommy Medal was contacted for follow up 02/27 as patient was nearing end of treatment course. He recommended repeat echo to loud murmur and this showed severe mitral regurgitation with 1.5 cm vegetation. Dr. Prescott Gum was consulted for input and felt that patient was not a surgical candidate and to continue medical treatment.  She continued to be limited by dense left hemiparesis with left inattention. She requires min cues to scan to the left and was showing some improvement in truncal rotation to left field.  She continued to require significant amount of assistance and family had elected on SNF for further therapy therefore SNF search was initiated.  She has been afebrile and was doing well till am of 03/09/17when she developed chills, increased WOB with oxygen saturation down to 75%.  She was placed on face mask with improvement in saturation to 90% and was treated with a dose of IV lasix with improvement in respiratory status.  Dr. Blaine Hamper was contacted for assistance and patient was transferred to ICU for closer monitoring.   Rehab course: During patient's stay in rehab weekly team conferences were held to monitor patient's progress, set goals and discuss barriers to discharge. At admission patient required total assist with mobility and basic self care tasks. She had mild dysarthria with left pocketing and severe cognitive deficits impacting attention, safety as well as poor awareness of deficits. She has had improvement in activity tolerance, balance, postural control, as well as ability to  compensate for deficits. She is has had improvement in functional use LUE  and LLE and was showing some improvement in awareness of deficits. She is able to perform transfers with Max assist +2 and able to propel her wheelchair with max assist for sequencing, motor planning and attention to the left. She was able to stand in standing frame for 7-15 minutes.  She required moderate assist to complete basic and familiar tasks but tended to fluctuate from max assist to supervision depending on fatigue and medication effects. She was tolerating water protocol without overt s/s of aspiration.     Disposition:  ICU   Diet: Dysphagia 3, nectar liquids. Full supervision at meals.        Discharge Instructions    Ambulatory referral to Physical Medicine Rehab    Complete by:  As directed   Stroke follow up/2-3 weeks after discharge--going to SNF           Follow-up Information    Follow up with Charlett Blake, MD.   Specialty:  Physical Medicine and Rehabilitation   Contact information:   [place] [address] [phone]       Follow up with Carlyle Basques, MD.   Specialty:  Infectious Diseases   Contact information:   [address] [place] [address] [phone]       Follow up with Loralie Champagne, MD.   Specialty:  Cardiology   Contact information:   [address] [place] [address] [phone]       Signed: Bary Leriche 08/01/2015, 3:47 PM

## 2015-08-01 NOTE — Progress Notes (Signed)
  Echocardiogram Echocardiogram Transesophageal has been performed.  Donata Clay 08/01/2015, 10:37 AM

## 2015-08-01 NOTE — Progress Notes (Signed)
Patient ID: Becky Patterson, female   DOB: 30-Mar-1956, 60 y.o.   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 antiseptic oral rinse  7 mL Mouth Rinse q12n4p  . chlorhexidine  15 mL Mouth Rinse BID  . enoxaparin (LOVENOX) injection  40 mg Subcutaneous Q24H  . imipenem-cilastatin  500 mg Intravenous Q8H  . insulin aspart  0-9 Units Subcutaneous 6 times per day  . sodium chloride flush  3 mL Intravenous Q12H  . vancomycin  1,000 mg Intravenous Q12H    Review of Systems: Review of Systems  Unable to perform ROS: mental acuity    Past Medical History  Diagnosis Date  . Lung mass   . Bacterial endocarditis   . Splenic infarction   . Epistaxis     Social History  Substance Use Topics  . Smoking status: Current Every Day Smoker  . Smokeless tobacco: Not on file  . Alcohol Use: No    Family History  Problem Relation Age of Onset  . Lung cancer Mother   . Liver cancer Brother   . Heart attack Brother    Allergies  Allergen Reactions  . Codeine Nausea And Vomiting    OBJECTIVE: Filed Vitals:   08/01/15 0755 08/01/15 0800 08/01/15 0815 08/01/15 0830  BP: 100/58 110/58 105/56 108/57  Pulse: 73 74 77 80  Temp:      TempSrc:      Resp: '23 21 19 21  '$ Weight:      SpO2: 100% 100% 97% 99%   Body mass index is 35.3 kg/(m^2).  Physical Exam  Constitutional:    She remained confused overnight. Her blood pressure has been labile.  Cardiovascular: Normal rate and regular rhythm.   Murmur heard. No change in 3/6 systolic murmur.  Pulmonary/Chest: Breath sounds normal.  Abdominal: Soft. There is no tenderness.  Musculoskeletal:  No redness, swelling or warmth noted in left shoulder where she has had some pain.  Skin: No rash noted.    Lab Results Lab Results  Component Value Date   WBC 18.9* 08/01/2015   HGB 8.3* 08/01/2015   HCT 27.4* 08/01/2015   MCV 92.6 08/01/2015   PLT 375 08/01/2015    Lab Results  Component Value Date   CREATININE 0.71 08/01/2015   BUN 12 08/01/2015   NA 141 08/01/2015   K 3.6 08/01/2015   CL 104 08/01/2015   CO2 24 08/01/2015    Lab Results  Component Value Date   ALT 20 08/01/2015   AST 28 08/01/2015   ALKPHOS 117 08/01/2015   BILITOT 0.8 08/01/2015     Microbiology: Recent Results (from the past 240 hour(s))  MRSA PCR Screening     Status: None  Collection Time: 07/31/15  6:25 AM  Result Value Ref Range Status   MRSA by PCR NEGATIVE NEGATIVE Final    Comment:        The GeneXpert MRSA Assay (FDA approved for NASAL specimens only), is one component of a comprehensive MRSA colonization surveillance program. It is not intended to diagnose MRSA infection nor to guide or monitor treatment for MRSA infections.   Culture, blood (routine x 2)     Status: None (Preliminary result)   Collection Time: 07/31/15  7:00 AM  Result Value Ref Range Status   Specimen Description BLOOD LEFT ARM  Final   Special Requests BOTTLES DRAWN AEROBIC AND ANAEROBIC 10CC  Final   Culture  Setup Time   Final    GRAM NEGATIVE RODS AEROBIC BOTTLE ONLY CRITICAL RESULT CALLED TO, READ BACK BY AND VERIFIED WITH: H SATTERFIELD,RN AT 0811 08/01/15 BY L BENFIELD    Culture GRAM NEGATIVE RODS  Final   Report Status PENDING  Incomplete  Culture, blood (routine x 2)     Status: None (Preliminary result)   Collection Time:  07/31/15  7:10 AM  Result Value Ref Range Status   Specimen Description BLOOD LEFT HAND  Final   Special Requests BOTTLES DRAWN AEROBIC AND ANAEROBIC [place]  Final   Culture  Setup Time   Final    ANAEROBIC BOTTLE ONLY GRAM NEGATIVE RODS CRITICAL RESULT CALLED TO, READ BACK BY AND VERIFIED WITH: TO DSOUTHERLAND(RN) BY TCLEVELAND 08/01/15 AT 4:11AM    Culture PENDING  Incomplete   Report Status PENDING  Incomplete  Culture, blood (x 2)     Status: None (Preliminary result)   Collection Time: 07/31/15  8:30 AM  Result Value Ref Range Status   Specimen Description BLOOD LEFT ARM  Final   Special Requests IN PEDIATRIC BOTTLE 3CC  Final   Culture PENDING  Incomplete   Report Status PENDING  Incomplete  Culture, blood (x 2)     Status: None (Preliminary result)   Collection Time: 07/31/15  8:34 AM  Result Value Ref Range Status   Specimen Description BLOOD LEFT HAND  Final   Special Requests IN PEDIATRIC BOTTLE 2CC  Final   Culture PENDING  Incomplete   Report Status PENDING  Incomplete     ASSESSMENT: One blood culture is growing gram-negative rods. I suspect that she has a UTI or PICC associated bacteremia superimposed upon her complicated Enterobacter mitral valve endocarditis. I will continue imipenem pending final culture results.  PLAN: 1. Continue imipenem pending final culture results 2. Discontinue vancomycin  Michel Bickers, MD [facility] [phone] pager   [phone] cell 08/01/2015, 9:44 AM

## 2015-08-01 NOTE — Care Management Note (Signed)
Case Management Note  Patient Details  Name: SABRYN PRESLAR MRN: [id number] Date of Birth: 02/17/1956  Subjective/Objective:   Pt admitted on 07/31/15 with septic shock.  PTA, pt was receiving rehab services at [facility].                  Action/Plan: Per CIR liaison, pt will not return to IP Rehab upon dc.  Family prefers pt discharge to SNF, as this was already in process on rehab unit.  CSW consulted to facilitate dc to SNF when medically stable for dc.  Will follow progress.   Expected Discharge Date:                  Expected Discharge Plan:  Skilled Nursing Facility  In-House Referral:  Clinical Social Work  Discharge planning Services  CM Consult  Post Acute Care Choice:    Choice offered to:     DME Arranged:    DME Agency:     HH Arranged:    [place] Agency:     Status of Service:  In process, will continue to follow  Medicare Important Message Given:    Date Medicare IM Given:    Medicare IM give by:    Date Additional Medicare IM Given:    Additional Medicare Important Message give by:     If discussed at [place] of Stay Meetings, dates discussed:    Additional Comments:  Reinaldo Raddle, RN, BSN  Trauma/Neuro ICU Case Manager [phone]

## 2015-08-01 NOTE — NC FL2 (Signed)
[place] MEDICAID FL2 LEVEL OF CARE SCREENING TOOL     IDENTIFICATION  Patient Name: MALAIYA PACZKOWSKI Birthdate: [birth year] Sex: female Admission Date (Current Location): 07/31/2015  [facility] and [place] Number:  [employer] and Address:  [facility] [place] [address] [place], [place] [address]      Provider Number: [id number]  Attending Physician Name and Address:  Rigoberto Noel, MD  Relative Name and Phone Number:  Triston Lisanti [phone]    Current Level of Care: Hospital Recommended Level of Care: [place] Prior Approval Number:    Date Approved/Denied:   PASRR Number: [id number]  Discharge Plan: SNF    Current Diagnoses: Patient Active Problem List   Diagnosis Date Noted  . Sepsis with hypotension ([place]) 07/31/2015  . Hyperglycemia 07/31/2015  . Septic shock ([place])   . Severe mitral regurgitation   . Cerebrovascular accident (CVA) due to embolism of right middle cerebral artery ([place])   . Intracranial septic embolism ([place])   . 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 Acute blood loss anemia   . 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 Left hemiparesis ([place]) 07/03/2015  . 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 Visual field cut 07/03/2015  . Palliative care encounter   . DNR (do not resuscitate) discussion   . Acute respiratory failure with hypoxia ([place]) 06/30/2015  . Colitis   . Mass of lower lobe of left lung   . Tobacco abuse   . Anemia of chronic disease     Orientation RESPIRATION BLADDER Height & Weight     Self, Place  Other (Comment) (Venturi Mask, however will wean to nasal canula prior to dc) Incontinent Weight: 186 lb 11.7 oz (84.7 kg) Height:     BEHAVIORAL SYMPTOMS/MOOD NEUROLOGICAL BOWEL NUTRITION STATUS      Continent Diet (Dysphagia 3 Nectar Thick)  AMBULATORY STATUS COMMUNICATION OF NEEDS Skin   Extensive Assist Verbally Normal                       Personal Care  Assistance Level of Assistance  Bathing, Feeding, Dressing Bathing Assistance: Maximum assistance Feeding assistance: Limited assistance Dressing Assistance: Maximum assistance     Functional Limitations Info  Sight, Hearing, Speech Sight Info: Impaired Hearing Info: Adequate Speech Info: Impaired    SPECIAL CARE FACTORS FREQUENCY  PT (By licensed PT), OT (By licensed OT), Speech therapy     PT Frequency: 5 (5) OT Frequency: 5     Speech Therapy Frequency: 5      Contractures Contractures Info: Not present    Additional Factors Info  Code Status, Allergies Code Status Info: DNR Allergies Info: Codeine           Current Medications (08/01/2015):  This is the current hospital active medication list Current Facility-Administered Medications  Medication Dose Route Frequency Provider Last Rate Last Dose  . 0.9 %  sodium chloride infusion  750 mL Intravenous PRN Erick Colace, NP      . acetaminophen (TYLENOL) tablet 650 mg  650 mg Oral Q6H PRN Samella Parr, NP   650 mg at 07/31/15 1743   Or  . acetaminophen (TYLENOL) suppository 650 mg  650 mg Rectal Q6H PRN Samella Parr, NP   650 mg at 08/01/15 1740  . antiseptic oral rinse (CPC / CETYLPYRIDINIUM CHLORIDE 0.05%) solution 7 mL  7 mL Mouth Rinse q12n4p Erick Colace, NP   7 mL at  08/01/15 1741  . chlorhexidine (PERIDEX) 0.12 % solution 15 mL  15 mL Mouth Rinse BID Erick Colace, NP   15 mL at 08/01/15 1027  . enoxaparin (LOVENOX) injection 40 mg  40 mg Subcutaneous Q24H Samella Parr, NP   40 mg at 08/01/15 0830  . imipenem-cilastatin (PRIMAXIN) 500 mg in sodium chloride 0.9 % 100 mL IVPB  500 mg Intravenous Q8H Crystal Trellis Moment, RPH   500 mg at 08/01/15 1027  . insulin aspart (novoLOG) injection 0-9 Units  0-9 Units Subcutaneous 6 times per day Samella Parr, NP   2 Units at 08/01/15 0830  . levalbuterol (XOPENEX) nebulizer solution 0.63 mg  0.63 mg Nebulization Q6H PRN Ivor Costa, MD      . nicardipine  (CARDENE) '20mg'$  in 0.86% saline 251m IV infusion (0.1 mg/ml)  3-15 mg/hr Intravenous Continuous JJavier Glazier MD   3 mg/hr at 07/31/15 2000  . norepinephrine (LEVOPHED) 4 mg in dextrose 5 % 250 mL (0.016 mg/mL) infusion  2-50 mcg/min Intravenous Continuous PErick Colace NP   Stopped at 08/01/15 1130  . ondansetron (ZOFRAN) tablet 4 mg  4 mg Oral Q6H PRN XIvor Costa MD       Or  . ondansetron ([facility] injection 4 mg  4 mg Intravenous Q6H PRN XIvor Costa MD      . sodium chloride flush (NS) 0.9 % injection 3 mL  3 mL Intravenous Q12H ASamella Parr NP   3 mL at 08/01/15 1028     Discharge Medications: Please see discharge summary for a list of discharge medications.  Relevant Imaging Results:  Relevant Lab Results:   Additional IMars [place]    Please send to current rounding physician

## 2015-08-02 LAB — CBC
HCT: 26.9 % — ABNORMAL LOW (ref 36.0–46.0)
HEMOGLOBIN: 8 g/dL — AB (ref 12.0–15.0)
MCH: 27.6 pg (ref 26.0–34.0)
MCHC: 29.7 g/dL — AB (ref 30.0–36.0)
MCV: 92.8 fL (ref 78.0–100.0)
PLATELETS: 319 10*3/uL (ref 150–400)
RBC: 2.9 MIL/uL — ABNORMAL LOW (ref 3.87–5.11)
RDW: 15.1 % (ref 11.5–15.5)
WBC: 10.4 10*3/uL (ref 4.0–10.5)

## 2015-08-02 LAB — GLUCOSE, CAPILLARY
GLUCOSE-CAPILLARY: 119 mg/dL — AB (ref 65–99)
GLUCOSE-CAPILLARY: 108 mg/dL — AB (ref 65–99)
GLUCOSE-CAPILLARY: 152 mg/dL — AB (ref 65–99)
Glucose-Capillary: 131 mg/dL — ABNORMAL HIGH (ref 65–99)
Glucose-Capillary: 110 mg/dL — ABNORMAL HIGH (ref 65–99)

## 2015-08-02 LAB — BASIC METABOLIC PANEL
Anion gap: 9 (ref 5–15)
BUN: 12 mg/dL (ref 6–20)
CALCIUM: 9.1 mg/dL (ref 8.9–10.3)
CO2: 26 mmol/L (ref 22–32)
CREATININE: 0.61 mg/dL (ref 0.44–1.00)
Chloride: 106 mmol/L (ref 101–111)
GFR calc Af Amer: >60 mL/min (ref >60)
GFR calc non Af Amer: >60 mL/min (ref >60)
GLUCOSE: 119 mg/dL — AB (ref 65–99)
Potassium: 3.3 mmol/L — ABNORMAL LOW (ref 3.5–5.1)
Sodium: 141 mmol/L (ref 135–145)

## 2015-08-02 MED ORDER — SODIUM CHLORIDE 0.9 % IV SOLN
INTRAVENOUS | Status: DC
  Administered 2015-08-02: 50 mL/h via INTRAVENOUS
  Administered 2015-08-03: 17:00:00 via INTRAVENOUS

## 2015-08-02 NOTE — Progress Notes (Signed)
ANTIBIOTIC CONSULT NOTE - INITIAL  Pharmacy Consult for Primaxin Indication: sepsis  Allergies  Allergen Reactions  . Codeine Nausea And Vomiting    Patient Measurements: Weight: 186 lb 11.7 oz (84.7 kg) Adjusted Body Weight:    Vital Signs: Temp: 98.5 F (36.9 C) (03/11 0800) Temp Source: Axillary (03/11 0800) BP: 111/74 mmHg (03/11 1100) Pulse Rate: 101 (03/11 1100) Intake/Output from previous day: 03/10 0701 - 03/11 0700 In: 477.2 [I.V.:177.2; IV Piggyback:300] Out: 1585 [Urine:1585] Intake/Output from this shift: Total I/O In: 153.3 [I.V.:53.3; IV Piggyback:100] Out: -   Labs:  Recent Labs  07/31/15 0630 08/01/15 0500 08/02/15 0630  WBC 13.9* 18.9* 10.4  HGB 9.8* 8.3* 8.0*  PLT 334 375 319  CREATININE 0.86 0.71 0.61   Estimated Creatinine Clearance: 73 mL/min (by C-G formula based on Cr of 0.61). No results for input(s): VANCOTROUGH, VANCOPEAK, VANCORANDOM, GENTTROUGH, GENTPEAK, GENTRANDOM, TOBRATROUGH, TOBRAPEAK, TOBRARND, AMIKACINPEAK, AMIKACINTROU, AMIKACIN in the last 72 hours.   Microbiology:   Medical History: Past Medical History  Diagnosis Date  . Lung mass   . Bacterial endocarditis   . Splenic infarction   . Epistaxis     Admit Complaint:  60 y.o. female admitted on 07/07/2015 with CVA/endocarditis. Transferred from CIR to 59M with SOB with decreased O2 sats 3/9 overnight. On day #3 Primaxin. All cultures from this admission are ngtd at this point. SCr stable, eCrCl 60-70 ml/min.  Infectious Disease: Known enterobacter endocarditis now has ? UTI +/- HCAPor Flu vs Asp PNA.   Rocephin 2/11-3/9 Vanco 3/9 Primaxin 3/9>>  1/22 BCx: Enterobacter 1/22 UCx: Enterobacter   3/9 blood cx: ngtd 3/9 urine cx: 30K yeast 3/9 catheter tip: ngtd   Plan:  Primaxin 500 mg IV q8h Monitor renal fx and duratio of therapy  Harvel Quale 08/02/2015,12:03 PM

## 2015-08-02 NOTE — Progress Notes (Signed)
PULMONARY / CRITICAL CARE MEDICINE   Name: Becky Patterson MRN: [id number] DOB: 01-Jun-1955    ADMISSION DATE:  07/31/2015 CONSULTATION DATE:  3/9  REFERRING MD:  Elgergawy   CHIEF COMPLAINT:  Septic shock   SIGNIFICANT EVENTS: 3/9 >> resp distress- bipap -- diuresed with lasix , precedex for agitation  SUBJECTIVE:  Feels thirsty.  VITAL SIGNS: BP 99/61 mmHg  Pulse 94  Temp(Src) 97.9 F (36.6 C) (Oral)  Resp 23  Wt 186 lb 11.7 oz (84.7 kg)  SpO2 98%  HEMODYNAMICS: CVP:  [7 mmHg-13 mmHg] 9 mmHg  VENTILATOR SETTINGS: Vent Mode:  [-]  FiO2 (%):  [30 %-40 %] 40 %  INTAKE / OUTPUT: I/O last 3 completed shifts: In: 3644.9 [I.V.:2494.9; IV Piggyback:1150] Out: 3125 [Urine:3125]  PHYSICAL EXAMINATION: General: alert Neuro: follows commands HEENT: no stridor Cardiac: regular, 3/6 SM Chest: no wheeze Abd: soft, non tender Ext: no edema Skin: no rashes  LABS:  BMET  Recent Labs Lab 07/31/15 0515 07/31/15 0630 08/01/15 0500  NA 137 138 141  K 3.4* 3.6 3.6  CL 102 103 104  CO2 21* 23 24  BUN '16 16 12  '$ CREATININE 0.85 0.86 0.71  GLUCOSE 216* 141* 167*    Electrolytes  Recent Labs Lab 07/31/15 0515 07/31/15 0630 08/01/15 0500  CALCIUM 9.3 9.5 9.0    CBC  Recent Labs Lab 07/31/15 0515 07/31/15 0630 08/01/15 0500  WBC 8.1 13.9* 18.9*  HGB 10.1* 9.8* 8.3*  HCT 31.8* 30.8* 27.4*  PLT 364 334 375    Coag's  Recent Labs Lab 07/31/15 0630  APTT 32  INR 1.46    Sepsis Markers  Recent Labs Lab 07/31/15 0630  07/31/15 0822 07/31/15 1323 07/31/15 1741  LATICACIDVEN  --   < > 2.3* 1.1 0.9  PROCALCITON 4.53  --   --   --   --   < > = values in this interval not displayed.  ABG  Recent Labs Lab 07/31/15 0436 07/31/15 0829  PHART 7.169* 7.373  PCO2ART 58.6* 40.7  PO2ART 120* 127.0*    Liver Enzymes  Recent Labs Lab 07/27/15 0829 07/31/15 0630 08/01/15 0500  AST 31 33 28  ALT '21 20 20  '$ ALKPHOS 103 127* 117  BILITOT 0.7  0.8 0.8  ALBUMIN 2.9* 2.8* 2.7*    Cardiac Enzymes No results for input(s): TROPONINI, PROBNP in the last 168 hours.  Glucose  Recent Labs Lab 08/01/15 0726 08/01/15 1221 08/01/15 1632 08/01/15 2009 08/01/15 2339 08/02/15 0333  GLUCAP 170* 103* 108* 104* 109* 119*    Imaging Portable Chest 1 View  08/01/2015  CLINICAL DATA:  Sepsis EXAM: PORTABLE CHEST 1 VIEW COMPARISON:  07/31/2015 FINDINGS: Left jugular line is again identified and stable in appearance. The cardiac shadow is stable. Bilateral pleural effusions are noted. Rounded nodular density is again noted in the left mid lung. This is stable from multiple prior exams. No new focal abnormality is seen. IMPRESSION: No significant interval change from the prior exam. Electronically Signed   By: Inez Catalina M.D.   On: 08/01/2015 07:59     STUDIES:  Echo 2/27 >> severe MR  CULTURES: BCX2 1/22: enterobacter; subsequent BCs on 2/5, 2/27 cleared on rocephin.  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 Had been in rehab on rocephin and making progress. Awoke w/ chills and hypotensive. Did not  respond to IVF challenge. New septic shock 3/8-->source to be determined. Potential sources include: Urinary source (dirty urine), infected PICC, recurrent aspiration (although right basilar infiltrate actually looks improved), or more remotely consider septic arthritis (c/o left shoulder pain).   ASSESSMENT / PLAN:  PULMONARY A: Acute hypercarbic resp failure >> resolved. P:   Oxygen to keep SpO2 90 to 95% Bronchial hygiene  CARDIOVASCULAR A:  Septic shock >> resolved. Severe MR P:  Monitor hemodynamics  RENAL A:   Lactic acidosis -->resolved P:   Monitor urine outpt  GASTROINTESTINAL A:   Dysphagia. 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 New septic shock 3/8--> likely UTI or PICC line infection -->improving. P:   Abx per ID  ENDOCRINE A:   Hyperglycemia.   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 P:   Mobilize as able   Updated pt's family at bedside.  Transfer to tele 3/11.  To [facility] 3/12 and PCCM off.  Chesley Mires, MD [facility] Pulmonary/Critical Care 08/02/2015, 6:50 AM Pager:  [phone] After 3pm call: [phone]

## 2015-08-02 NOTE — Evaluation (Signed)
Clinical/Bedside Swallow Evaluation Patient Details  Name: Becky Patterson MRN: [id number] Date of Birth: 1955-10-29  Today's Date: 08/02/2015 Time: SLP Start Time (ACUTE ONLY): 0936 SLP Stop Time (ACUTE ONLY): 1005 SLP Time Calculation (min) (ACUTE ONLY): 29 min  Past Medical History:  Past Medical History  Diagnosis Date  . Lung mass   . Bacterial endocarditis   . Splenic infarction   . Epistaxis    Past Surgical History:  Past Surgical History  Procedure Laterality Date  . Back surgery    . Tee without cardioversion N/A 06/24/2015    Procedure: TRANSESOPHAGEAL ECHOCARDIOGRAM (TEE);  Surgeon: Larey Dresser, MD;  Location: [place];  Service: Cardiovascular;  Laterality: N/A;  . Radiology with anesthesia N/A 06/29/2015    Procedure: RADIOLOGY WITH ANESTHESIA;  Surgeon: Luanne Bras, MD;  Location: [place];  Service: Radiology;  Laterality: N/A;  . Cholecystectomy     HPI:  60 year old female patient with complex recent history. 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 Course was complicated by a large right MCA infarct/left hemiplegia. She was transferred to rehabilitation on 2/13 and was recovering well.  Was participating in dysphagia treatment; on dysphagia 3 diet with nectar-thick liquids.  MBS revealed mild oropharyngeal dysphagia. Was transferred to the ICU 3/9 with fever and hypotension and tachycardia, ABG showing acute respiratory acidosis and required BiPAP.   Assessment / Plan / Recommendation Clinical Impression  Pt's swallow function appears consistent with performance during CIR admission- Continues with persisting mild deficits in oral preparation, likely delayed swallow function, no overt s/s of aspiration with nectar thick liquids and solids.  RR in mid 20s; SP02 ranging 92-96% with face mask (pt prefers to nasal  cannula). Respiratory needs do not appear to be interfering with swallowing/airway protection at this time.  CXR improving with re: concerns for aspiration.  For now, recommend resuming a dysphagia 3 diet with nectar liquids; crush meds and give with puree.  D/W pt and spouse the necessity of eating slowly, taking small sips, allowing ample time for breaks/breathing during meals.  Pt verbalizes understanding but cognitive deficits impact ability to execute instructions.  SLP will follow closely.      Aspiration Risk  Mild aspiration risk    Diet Recommendation     Medication Administration: Crushed with puree    Other  Recommendations Oral Care Recommendations: Oral care BID Other Recommendations: Order thickener from pharmacy   Follow up Recommendations       Frequency and Duration min 3x week  2 weeks       Prognosis Prognosis for Safe Diet Advancement: Good      Swallow Study   General HPI: 60 year old female patient with complex recent history. 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 Course was complicated by a large right MCA infarct/left hemiplegia. She was transferred to rehabilitation on 2/13 and was recovering well.  Was participating in dysphagia treatment; on dysphagia 3 diet with nectar-thick liquids.  MBS revealed mild oropharyngeal dysphagia. Was transferred to the ICU 3/9 with fever and hypotension and tachycardia, ABG showing acute respiratory acidosis and required BiPAP. Type of Study: Bedside Swallow Evaluation Previous Swallow Assessment: mbs 2/21 Diet Prior to this Study: NPO Temperature Spikes Noted: No Respiratory Status:  (face mask) History of Recent Intubation: Yes Length of Intubations (  days): 3 days Date extubated: 07/02/15 Behavior/Cognition: Cooperative;Pleasant mood;Lethargic/Drowsy Oral Cavity Assessment:  Within Functional Limits Oral Care Completed by SLP: No Oral Cavity - Dentition: Adequate natural dentition Vision: Functional for self-feeding (left neglect) Self-Feeding Abilities: Able to feed self;Needs assist Patient Positioning: Upright in bed Baseline Vocal Quality: Normal Volitional Cough: Strong Volitional Swallow: Able to elicit    Oral/Motor/Sensory Function Overall Oral Motor/Sensory Function: Moderate impairment Facial ROM: Reduced left;Suspected CN VII (facial) dysfunction Facial Symmetry: Abnormal symmetry left;Suspected CN VII (facial) dysfunction Facial Strength: Reduced left;Suspected CN VII (facial) dysfunction Facial Sensation: Within Functional Limits Lingual ROM: Reduced left;Suspected CN XII (hypoglossal) dysfunction Lingual Symmetry: Abnormal symmetry left;Suspected CN XII (hypoglossal) dysfunction Lingual Strength: Reduced;Suspected CN XII (hypoglossal) dysfunction Lingual Sensation: Within Functional Limits Velum: Within Functional Limits Mandible: Within Functional Limits   Ice Chips Ice chips: Not tested   Thin Liquid Thin Liquid: Impaired Presentation: Cup Pharyngeal  Phase Impairments: Wet Vocal Quality;Cough - Immediate    Nectar Thick Nectar Thick Liquid: Impaired Presentation: Self Fed Oral phase functional implications: Left anterior spillage Pharyngeal Phase Impairments: Suspected delayed Swallow   Honey Thick Honey Thick Liquid: Not tested Oral Phase Impairments: Reduced labial seal   Puree Puree: Within functional limits   Solid   GO   Solid: Impaired Presentation: Self Fed Oral Phase Functional Implications: Prolonged oral transit       Priyanka Causey L. Tivis Ringer, [place] CCC/SLP Pager [phone]  Juan Quam Laurice 08/02/2015,10:24 AM

## 2015-08-03 DIAGNOSIS — D638 Anemia in other chronic diseases classified elsewhere: Secondary | ICD-10-CM

## 2015-08-03 DIAGNOSIS — R6520 Severe sepsis without septic shock: Secondary | ICD-10-CM

## 2015-08-03 DIAGNOSIS — I63419 Cerebral infarction due to embolism of unspecified middle cerebral artery: Secondary | ICD-10-CM

## 2015-08-03 DIAGNOSIS — N390 Urinary tract infection, site not specified: Secondary | ICD-10-CM

## 2015-08-03 DIAGNOSIS — E876 Hypokalemia: Secondary | ICD-10-CM

## 2015-08-03 DIAGNOSIS — R7881 Bacteremia: Secondary | ICD-10-CM

## 2015-08-03 DIAGNOSIS — I69391 Dysphagia following cerebral infarction: Secondary | ICD-10-CM

## 2015-08-03 DIAGNOSIS — B9689 Other specified bacterial agents as the cause of diseases classified elsewhere: Secondary | ICD-10-CM

## 2015-08-03 LAB — CBC
HCT: 28.1 % — ABNORMAL LOW (ref 36.0–46.0)
HEMOGLOBIN: 8.6 g/dL — AB (ref 12.0–15.0)
MCH: 28.1 pg (ref 26.0–34.0)
MCHC: 30.6 g/dL (ref 30.0–36.0)
MCV: 91.8 fL (ref 78.0–100.0)
PLATELETS: 345 10*3/uL (ref 150–400)
RBC: 3.06 MIL/uL — AB (ref 3.87–5.11)
RDW: 14.9 % (ref 11.5–15.5)
WBC: 9.5 10*3/uL (ref 4.0–10.5)

## 2015-08-03 LAB — GLUCOSE, CAPILLARY
Glucose-Capillary: 109 mg/dL — ABNORMAL HIGH (ref 65–99)
Glucose-Capillary: 110 mg/dL — ABNORMAL HIGH (ref 65–99)
Glucose-Capillary: 135 mg/dL — ABNORMAL HIGH (ref 65–99)
Glucose-Capillary: 135 mg/dL — ABNORMAL HIGH (ref 65–99)
Glucose-Capillary: 144 mg/dL — ABNORMAL HIGH (ref 65–99)
Glucose-Capillary: 149 mg/dL — ABNORMAL HIGH (ref 65–99)

## 2015-08-03 LAB — BASIC METABOLIC PANEL
ANION GAP: 11 (ref 5–15)
BUN: 10 mg/dL (ref 6–20)
CHLORIDE: 104 mmol/L (ref 101–111)
CO2: 25 mmol/L (ref 22–32)
CREATININE: 0.62 mg/dL (ref 0.44–1.00)
Calcium: 9.1 mg/dL (ref 8.9–10.3)
GFR calc non Af Amer: >60 mL/min (ref >60)
Glucose, Bld: 142 mg/dL — ABNORMAL HIGH (ref 65–99)
Potassium: 3.4 mmol/L — ABNORMAL LOW (ref 3.5–5.1)
SODIUM: 140 mmol/L (ref 135–145)

## 2015-08-03 MED ORDER — SACCHAROMYCES BOULARDII 250 MG PO CAPS
250.0000 mg | ORAL_CAPSULE | Freq: Two times a day (BID) | ORAL | Status: DC
  Administered 2015-08-03 – 2015-08-07 (×9): 250 mg via ORAL
  Filled 2015-08-03 (×9): qty 1

## 2015-08-03 MED ORDER — SODIUM CHLORIDE 0.9 % IV SOLN
500.0000 mg | Freq: Four times a day (QID) | INTRAVENOUS | Status: DC
  Administered 2015-08-03 – 2015-08-04 (×4): 500 mg via INTRAVENOUS
  Filled 2015-08-03 (×7): qty 500

## 2015-08-03 MED ORDER — POTASSIUM CHLORIDE CRYS ER 20 MEQ PO TBCR
40.0000 meq | EXTENDED_RELEASE_TABLET | Freq: Once | ORAL | Status: AC
  Administered 2015-08-03: 40 meq via ORAL
  Filled 2015-08-03: qty 2

## 2015-08-03 MED ORDER — TRAZODONE HCL 50 MG PO TABS
25.0000 mg | ORAL_TABLET | Freq: Every evening | ORAL | Status: DC | PRN
  Administered 2015-08-03 – 2015-08-06 (×4): 25 mg via ORAL
  Filled 2015-08-03 (×5): qty 1

## 2015-08-03 MED ORDER — NICOTINE 14 MG/24HR TD PT24
14.0000 mg | MEDICATED_PATCH | Freq: Every day | TRANSDERMAL | Status: DC
  Administered 2015-08-03 – 2015-08-07 (×5): 14 mg via TRANSDERMAL
  Filled 2015-08-03 (×5): qty 1

## 2015-08-03 MED ORDER — LOPERAMIDE HCL 2 MG PO CAPS
2.0000 mg | ORAL_CAPSULE | Freq: Three times a day (TID) | ORAL | Status: DC | PRN
  Administered 2015-08-03: 2 mg via ORAL
  Filled 2015-08-03: qty 1

## 2015-08-03 NOTE — Progress Notes (Signed)
ANTIBIOTIC CONSULT NOTE - Follow up  Pharmacy Consult for Primaxin Indication: sepsis/GNR bacteremia   Patient Measurements: Weight: 179 lb 8 oz (81.421 kg)    Vital Signs: BP: 117/68 mmHg (03/12 0536) Pulse Rate: 97 (03/12 0536) Intake/Output from previous day: 03/11 0701 - 03/12 0700 In: 1133.3 [P.O.:480; I.V.:453.3; IV Piggyback:200] Out: 575 [Urine:575]  Labs:  Recent Labs  08/01/15 0500 08/02/15 0630 08/03/15 0751  WBC 18.9* 10.4 9.5  HGB 8.3* 8.0* 8.6*  PLT 375 319 345  CREATININE 0.71 0.61 0.62    Medical History: Past Medical History  Diagnosis Date  . Lung mass   . Bacterial endocarditis   . Splenic infarction   . Epistaxis     Admit Complaint:  60 y.o. female admitted on 07/07/2015 with CVA and known enterobacter endocarditis. Currently on day #4 Primaxin for GNR bacteremia . ? UTI or PICC line as source. SCr stable, CrCl 65-75 ml/min. Transferred to 5W 3/11 pm.  Abx received this admission Rocephin 2/11-3/9 Vanco 3/9 > 3/10 Primaxin 3/9>>  Culture data: 3/9 blood cx: GNR bacteremia  3/9 urine cx: 30K yeast 3/9 catheter tip: ngtd 1/22 BCx: Enterobacter 1/22 UCx: Enterobacter    Plan: 1. Adjust Primaxin to 500 mg IV q6h as on right on the threshold for every 6 hour or more aggressive dosing and has known GNR bacteremia 2. Await finalization of culture data and narrow abx as feasible  3. Following along with you daily   Vincenza Hews, PharmD, BCPS 08/03/2015, 10:59 AM Pager: [phone]

## 2015-08-03 NOTE — Progress Notes (Addendum)
[facility] PROGRESS NOTE  Becky Patterson ZOX:[id number] DOB: [birth year] DOA: 07/31/2015  PCP: No primary care provider on file.  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 Had been in rehab on rocephin and making progress. Awoke w/ chills and hypotensive. Did not respond to IVF challenge. New septic shock 3/8-->source to be determined.   Past medical history:  Past Medical History  Diagnosis Date  . Lung mass   . Bacterial endocarditis   . Splenic infarction   . Epistaxis     Consultants: Infectious disease  Procedures:  PICC line was removed 3/9 Left IJ central venous catheter placed 3/9  2-D echocardiogram Study Conclusions - Left ventricle: The cavity size was normal. There was mild concentric hypertrophy. Systolic function was normal. The estimated ejection fraction was in the range of 60% to 65%. Wall motion was normal; there were no regional wall motion abnormalities. Features are consistent with a pseudonormal left ventricular filling pattern, with concomitant abnormal relaxation and increased filling pressure (grade 2 diastolic dysfunction). Doppler parameters are consistent with elevated ventricular end-diastolic filling pressure. - Aortic valve: Trileaflet; normal thickness leaflets. There is mild thickening of the right coronary cusp. - Mitral valve: There is persistent thickening of the anterior mitral valve leaflet consistent with known vegetation. There was moderate regurgitation. - Left atrium: The atrium was mildly dilated. - Right ventricle: The cavity size was normal. Wall thickness was normal. Systolic function was normal. - Tricuspid valve: There was mild regurgitation. - Pulmonary arteries: Systolic pressure was within the normal range. - Inferior vena cava: The vessel was normal in size. - Pericardium, extracardiac: There was no pericardial effusion. Impressions: - There is no significant change when  compared to the prior study from 07/21/2015. Anterior mitral leaflet vegetation appears similar in size, MR is at least moderate.  Antibiotics: Currently on imipenem  Subjective: Patient continues to complain of generalized weakness. Continues to have left-sided weakness from previous stroke. No nausea, vomiting. Her daughter is at the bedside. Patient also mentions loose stools, which have been ongoing for the past many months. Denies any abdominal pain.  Objective: Vital Signs  Filed Vitals:   08/02/15 1900 08/02/15 2109 08/03/15 0309 08/03/15 0536  BP: 115/69 107/59  117/68  Pulse: 107 101  97  Temp:      TempSrc:      Resp:  20  18  Weight:   81.421 kg (179 lb 8 oz)   SpO2: 97% 98%  94%    Intake/Output Summary (Last 24 hours) at 08/03/15 1036 Last data filed at 08/02/15 2000  Gross per 24 hour  Intake 1033.33 ml  Output    425 ml  Net 608.33 ml   Filed Weights   08/01/15 0500 08/03/15 0309  Weight: 84.7 kg (186 lb 11.7 oz) 81.421 kg (179 lb 8 oz)    General appearance: alert, cooperative, distracted and no distress Resp: Diminished air entry at the bases. No crackles or wheezing. Cardio: regular rate and rhythm, S1, S2 normal. Systolic murmur appreciated over the precordium. GI: soft, non-tender; bowel sounds normal; no masses,  no organomegaly Extremities: extremities normal, atraumatic, no cyanosis or edema Neurologic: Awake and alert. Slightly distracted. Left-sided hemiparesis noted.  Lab Results:  Basic Metabolic Panel:  Recent Labs Lab 07/31/15 0515 07/31/15 0630 08/01/15 0500 08/02/15 0630 08/03/15 0751  NA 137 138 141 141 140  K 3.4* 3.6 3.6 3.3* 3.4*  CL 102 103 104 106 104  CO2 21* 23  $'24 26 25  'b$ GLUCOSE 216* 141* 167* 119* 142*  BUN '16 16 12 12 10  '$ CREATININE 0.85 0.86 0.71 0.61 0.62  CALCIUM 9.3 9.5 9.0 9.1 9.1   Liver Function Tests:  Recent Labs Lab 07/31/15 0630 08/01/15 0500  AST 33 28  ALT 20 20  ALKPHOS 127* 117  BILITOT 0.8  0.8  PROT 7.3 6.4*  ALBUMIN 2.8* 2.7*   CBC:  Recent Labs Lab 07/31/15 0515 07/31/15 0630 08/01/15 0500 08/02/15 0630 08/03/15 0751  WBC 8.1 13.9* 18.9* 10.4 9.5  NEUTROABS 7.2 13.4*  --   --   --   HGB 10.1* 9.8* 8.3* 8.0* 8.6*  HCT 31.8* 30.8* 27.4* 26.9* 28.1*  MCV 94.4 93.6 92.6 92.8 91.8  PLT 364 334 375 319 345    CBG:  Recent Labs Lab 08/02/15 1603 08/02/15 1952 08/03/15 0008 08/03/15 0358 08/03/15 0758  GLUCAP 110* 152* 109* 110* 135*    Recent Results (from the past 240 hour(s))  MRSA PCR Screening     Status: None   Collection Time: 07/31/15  6:25 AM  Result Value Ref Range Status   MRSA by PCR NEGATIVE NEGATIVE Final    Comment:        The GeneXpert MRSA Assay (FDA approved for NASAL specimens only), is one component of a comprehensive MRSA colonization surveillance program. It is not intended to diagnose MRSA infection nor to guide or monitor treatment for MRSA infections.   Culture, blood (routine x 2)     Status: None (Preliminary result)   Collection Time: 07/31/15  7:00 AM  Result Value Ref Range Status   Specimen Description BLOOD LEFT ARM  Final   Special Requests BOTTLES DRAWN AEROBIC AND ANAEROBIC 10CC  Final   Culture  Setup Time   Final    GRAM NEGATIVE RODS IN BOTH AEROBIC AND ANAEROBIC BOTTLES CRITICAL RESULT CALLED TO, READ BACK BY AND VERIFIED WITH: H SATTERFIELD,RN AT 0811 08/01/15 BY L BENFIELD    Culture   Final    GRAM NEGATIVE RODS IDENTIFICATION AND SUSCEPTIBILITIES TO FOLLOW    Report Status PENDING  Incomplete  Urine culture     Status: None   Collection Time: 07/31/15  7:09 AM  Result Value Ref Range Status   Specimen Description URINE, CATHETERIZED  Final   Special Requests NONE  Final   Culture 30,000 COLONIES/mL YEAST  Final   Report Status 08/01/2015 FINAL  Final  Culture, blood (routine x 2)     Status: None (Preliminary result)   Collection Time: 07/31/15  7:10 AM  Result Value Ref Range Status    Specimen Description BLOOD LEFT HAND  Final   Special Requests BOTTLES DRAWN AEROBIC AND ANAEROBIC [place]  Final   Culture  Setup Time   Final    GRAM NEGATIVE RODS CRITICAL RESULT CALLED TO, READ BACK BY AND VERIFIED WITH: TO DSOUTHERLAND(RN) BY TCLEVELAND 08/01/15 AT 4:11AM IN BOTH AEROBIC AND ANAEROBIC BOTTLES    Culture   Final    GRAM NEGATIVE RODS IDENTIFICATION AND SUSCEPTIBILITIES TO FOLLOW    Report Status PENDING  Incomplete  Culture, blood (x 2)     Status: None (Preliminary result)   Collection Time: 07/31/15  8:30 AM  Result Value Ref Range Status   Specimen Description BLOOD LEFT ARM  Final   Special Requests IN PEDIATRIC BOTTLE 3CC  Final   Culture NO GROWTH 3 DAYS  Final   Report Status PENDING  Incomplete  Culture, blood (x 2)  Status: None (Preliminary result)   Collection Time: 07/31/15  8:34 AM  Result Value Ref Range Status   Specimen Description BLOOD LEFT HAND  Final   Special Requests IN PEDIATRIC BOTTLE 2CC  Final   Culture NO GROWTH 3 DAYS  Final   Report Status PENDING  Incomplete  Cath Tip Culture     Status: None (Preliminary result)   Collection Time: 07/31/15 12:41 PM  Result Value Ref Range Status   Specimen Description CATH TIP PICC LINE  Final   Special Requests NONE  Final   Culture   Final    NO GROWTH 2 DAYS Performed at [facility]    Report Status PENDING  Incomplete      Studies/Results: No results found.  Medications:  Scheduled: . antiseptic oral rinse  7 mL Mouth Rinse q12n4p  . chlorhexidine  15 mL Mouth Rinse BID  . enoxaparin (LOVENOX) injection  40 mg Subcutaneous Q24H  . imipenem-cilastatin  500 mg Intravenous 4 times per day  . insulin aspart  0-9 Units Subcutaneous 6 times per day  . nicotine  14 mg Transdermal Daily  . saccharomyces boulardii  250 mg Oral BID   Continuous: . 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 Patient was transferred out of the intensive care unit to the floor. Blood pressures are stable. Gram-negative bacteremia is noted. Final identification is pending. Continue imipenem. Infectious disease is following.  Loose Stools  This is chronic for the patient. Her abdomen is benign. Imodium as needed. Florastor.  Recent bacterial endocarditis secondary to Enterobacter with mitral regurgitation Patient was at rehabilitation receiving ceftriaxone for the above. Currently on imipenem for reasons stated above. Echocardiogram was repeated during this hospitalization does not show any significant changes compared to previous.  Acute hypercarbic respiratory failure Resolved. Oxygen as needed.  Dysphagia His therapy is following. Patient is on a dysphagia diet.  Anemia of chronic disease Hemoglobin is stable. Continue to monitor.  History of stroke due to septic embolization of the right middle cerebral artery Patient has left hemiparesis. PT and OT to continue. She does not need antithrombotic treatment.  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 Patient had previously refused biopsy. Outpatient management once she has completed rehabilitation.   DVT Prophylaxis: Lovenox    Code Status: DO NOT RESUSCITATE  Family Communication: Discussed  with the patient and her daughter  Disposition Plan: Continue management as outlined above. Await final identification and sensitivities of blood and urine cultures. PT and OT to evaluate.    LOS: 3 days   [facility] [facility] Pager [phone] 08/03/2015, 10:36 AM  If 7PM-7AM, please contact night-coverage at [email or web address], password [facility]

## 2015-08-03 NOTE — Progress Notes (Signed)
INFECTIOUS DISEASE PROGRESS NOTE  ID: Becky Patterson is a 60 y.o. 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   Abtx:  Anti-infectives    Start     Dose/Rate Route Frequency Ordered Stop   08/03/15 1800  imipenem-cilastatin (PRIMAXIN) 500 mg in sodium chloride 0.9 % 100 mL IVPB     500 mg 200 mL/hr over 30 Minutes Intravenous 4 times per day 08/03/15 1055     07/31/15 1800  vancomycin (VANCOCIN) IVPB 1000 mg/200 mL premix  Status:  Discontinued     1,000 mg 200 mL/hr over 60 Minutes Intravenous Every 12 hours 07/31/15 0617 08/01/15 0954   07/31/15 1000  imipenem-cilastatin (PRIMAXIN) 500 mg in sodium chloride 0.9 % 100 mL IVPB  Status:  Discontinued     500 mg 200 mL/hr over 30 Minutes Intravenous Every 8 hours 07/31/15 0849 08/03/15 1055   07/31/15 0630  vancomycin (VANCOCIN) 1,250 mg in sodium chloride 0.9 % 250 mL IVPB     1,250 mg 166.7 mL/hr over 90 Minutes Intravenous  Once 07/31/15 0617 07/31/15 0923   07/31/15 0630  piperacillin-tazobactam (ZOSYN) IVPB 3.375 g  Status:  Discontinued     3.375 g 12.5 mL/hr over 240 Minutes Intravenous 3 times per day 07/31/15 0618 07/31/15 0754      Medications:  Scheduled: . antiseptic oral rinse  7 mL Mouth Rinse q12n4p  . chlorhexidine  15 mL Mouth Rinse BID  . enoxaparin (LOVENOX) injection  40 mg Subcutaneous Q24H  . imipenem-cilastatin  500 mg Intravenous 4 times per day  . insulin aspart  0-9 Units Subcutaneous 6  times per day  . nicotine  14 mg Transdermal Daily  . saccharomyces boulardii  250 mg Oral BID    Objective: Vital signs in last 24 hours: Temp:  [98.9 F (37.2 C)] 98.9 F (37.2 C) (03/12 1524) Pulse Rate:  [97-107] 103 (03/12 1524) Resp:  [18-20] 18 (03/12 1524) BP: (107-120)/(57-70) 107/57 mmHg (03/12 1524) SpO2:  [94 %-98 %] 95 % (03/12 1524) Weight:  [81.421 kg (179 lb 8 oz)] 81.421 kg (179 lb 8 oz) (03/12 0309)   General appearance: alert, cooperative and mild distress Resp: clear to auscultation bilaterally Cardio: regular rate and rhythm GI: normal findings: bowel sounds normal and soft, non-tender  Lab Results  Recent Labs  08/02/15 0630 08/03/15 0751  WBC 10.4 9.5  HGB 8.0* 8.6*  HCT 26.9* 28.1*  NA 141 140  K 3.3* 3.4*  CL 106 104  CO2 26 25  BUN 12 10  CREATININE 0.61 0.62   Liver Panel  Recent Labs  08/01/15 0500  PROT 6.4*  ALBUMIN 2.7*  AST 28  ALT 20  ALKPHOS 117  BILITOT 0.8   Sedimentation Rate No results for input(s): ESRSEDRATE in the last 72 hours. C-Reactive Protein No results for input(s): CRP in the last 72 hours.  Microbiology: Recent Results (from the past 240 hour(s))  MRSA PCR Screening     Status: None  Collection Time: 07/31/15  6:25 AM  Result Value Ref Range Status   MRSA by PCR NEGATIVE NEGATIVE Final    Comment:        The GeneXpert MRSA Assay (FDA approved for NASAL specimens only), is one component of a comprehensive MRSA colonization surveillance program. It is not intended to diagnose MRSA infection nor to guide or monitor treatment for MRSA infections.   Culture, blood (routine x 2)     Status: None (Preliminary result)   Collection Time: 07/31/15  7:00 AM  Result Value Ref Range Status   Specimen Description BLOOD LEFT ARM  Final   Special Requests BOTTLES DRAWN AEROBIC AND ANAEROBIC 10CC  Final   Culture  Setup Time   Final    GRAM NEGATIVE RODS IN BOTH AEROBIC AND ANAEROBIC BOTTLES CRITICAL  RESULT CALLED TO, READ BACK BY AND VERIFIED WITH: H SATTERFIELD,RN AT 0811 08/01/15 BY L BENFIELD    Culture   Final    GRAM NEGATIVE RODS IDENTIFICATION AND SUSCEPTIBILITIES TO FOLLOW    Report Status PENDING  Incomplete  Urine culture     Status: None   Collection Time: 07/31/15  7:09 AM  Result Value Ref Range Status   Specimen Description URINE, CATHETERIZED  Final   Special Requests NONE  Final   Culture 30,000 COLONIES/mL YEAST  Final   Report Status 08/01/2015 FINAL  Final  Culture, blood (routine x 2)     Status: None (Preliminary result)   Collection Time: 07/31/15  7:10 AM  Result Value Ref Range Status   Specimen Description BLOOD LEFT HAND  Final   Special Requests BOTTLES DRAWN AEROBIC AND ANAEROBIC [place]  Final   Culture  Setup Time   Final    GRAM NEGATIVE RODS CRITICAL RESULT CALLED TO, READ BACK BY AND VERIFIED WITH: TO DS[place](RN) BY TCLEVELAND 08/01/15 AT 4:11AM IN BOTH AEROBIC AND ANAEROBIC BOTTLES    Culture   Final    GRAM NEGATIVE RODS IDENTIFICATION AND SUSCEPTIBILITIES TO FOLLOW    Report Status PENDING  Incomplete  Culture, blood (x 2)     Status: None (Preliminary result)   Collection Time: 07/31/15  8:30 AM  Result Value Ref Range Status   Specimen Description BLOOD LEFT ARM  Final   Special Requests IN PEDIATRIC BOTTLE 3CC  Final   Culture NO GROWTH 3 DAYS  Final   Report Status PENDING  Incomplete  Culture, blood (x 2)     Status: None (Preliminary result)   Collection Time: 07/31/15  8:34 AM  Result Value Ref Range Status   Specimen Description BLOOD LEFT HAND  Final   Special Requests IN PEDIATRIC BOTTLE 2CC  Final   Culture NO GROWTH 3 DAYS  Final   Report Status PENDING  Incomplete  Cath Tip Culture     Status: None (Preliminary result)   Collection Time: 07/31/15 12:41 PM  Result Value Ref Range Status   Specimen Description CATH TIP PICC LINE  Final   Special Requests NONE  Final   Culture   Final    NO GROWTH 2 DAYS Performed at  [facility]    Report Status PENDING  Incomplete    Studies/Results: No results found.   Assessment/Plan: Sepsis UTI? GNR in BCx Enterobacter Endocarditis (Jan 2017) MCA CVA Hypokalemia  Total days of antibiotics: 3 imipenem     PIC removed BCx ID still pending.   No change in anbx for now Primary to address K+     Dellis Filbert  Dhiren Azimi Infectious Diseases (pager) [phone] [email or web address] 08/03/2015, 5:26 PM  LOS: 3 days

## 2015-08-03 NOTE — Progress Notes (Signed)
Family called RN and stated pt is having difficulty breathing. VS obtained and stable. O2 sats @ 95%. Respirations elevated. Pt c/o being hot, addressed by staff. Nebulization treatment given for SOB with relief. Lungs sound clear, no wheezing noted when auscultated. Will continue to monitor.

## 2015-08-04 ENCOUNTER — Inpatient Hospital Stay (HOSPITAL_COMMUNITY): Payer: 59

## 2015-08-04 DIAGNOSIS — A498 Other bacterial infections of unspecified site: Secondary | ICD-10-CM | POA: Insufficient documentation

## 2015-08-04 DIAGNOSIS — I5031 Acute diastolic (congestive) heart failure: Secondary | ICD-10-CM | POA: Diagnosis not present

## 2015-08-04 DIAGNOSIS — B952 Enterococcus as the cause of diseases classified elsewhere: Secondary | ICD-10-CM

## 2015-08-04 LAB — BASIC METABOLIC PANEL
ANION GAP: 10 (ref 5–15)
BUN: 10 mg/dL (ref 6–20)
CALCIUM: 9 mg/dL (ref 8.9–10.3)
CO2: 25 mmol/L (ref 22–32)
Chloride: 107 mmol/L (ref 101–111)
Creatinine, Ser: 0.67 mg/dL (ref 0.44–1.00)
GFR calc Af Amer: >60 mL/min (ref >60)
GLUCOSE: 129 mg/dL — AB (ref 65–99)
POTASSIUM: 3.9 mmol/L (ref 3.5–5.1)
SODIUM: 142 mmol/L (ref 135–145)

## 2015-08-04 LAB — GLUCOSE, CAPILLARY
GLUCOSE-CAPILLARY: 118 mg/dL — AB (ref 65–99)
GLUCOSE-CAPILLARY: 116 mg/dL — AB (ref 65–99)
GLUCOSE-CAPILLARY: 125 mg/dL — AB (ref 65–99)
Glucose-Capillary: 113 mg/dL — ABNORMAL HIGH (ref 65–99)
Glucose-Capillary: 138 mg/dL — ABNORMAL HIGH (ref 65–99)
Glucose-Capillary: 141 mg/dL — ABNORMAL HIGH (ref 65–99)
Glucose-Capillary: 151 mg/dL — ABNORMAL HIGH (ref 65–99)
Glucose-Capillary: 226 mg/dL — ABNORMAL HIGH (ref 65–99)

## 2015-08-04 LAB — CBC
HCT: 27.4 % — ABNORMAL LOW (ref 36.0–46.0)
Hemoglobin: 8.1 g/dL — ABNORMAL LOW (ref 12.0–15.0)
MCH: 27.6 pg (ref 26.0–34.0)
MCHC: 29.6 g/dL — ABNORMAL LOW (ref 30.0–36.0)
MCV: 93.5 fL (ref 78.0–100.0)
PLATELETS: 356 10*3/uL (ref 150–400)
RBC: 2.93 MIL/uL — AB (ref 3.87–5.11)
RDW: 15 % (ref 11.5–15.5)
WBC: 9.6 10*3/uL (ref 4.0–10.5)

## 2015-08-04 LAB — MAGNESIUM: MAGNESIUM: 1.5 mg/dL — AB (ref 1.7–2.4)

## 2015-08-04 LAB — CULTURE, BLOOD (ROUTINE X 2)

## 2015-08-04 LAB — CATH TIP CULTURE: CULTURE: NO GROWTH

## 2015-08-04 MED ORDER — POTASSIUM CHLORIDE CRYS ER 20 MEQ PO TBCR
20.0000 meq | EXTENDED_RELEASE_TABLET | Freq: Once | ORAL | Status: AC
  Administered 2015-08-04: 20 meq via ORAL
  Filled 2015-08-04: qty 1

## 2015-08-04 MED ORDER — CIPROFLOXACIN HCL 500 MG PO TABS: 750.0000 mg | ORAL_TABLET | Freq: Two times a day (BID) | ORAL | Status: DC

## 2015-08-04 MED ORDER — FUROSEMIDE 10 MG/ML IJ SOLN
40.0000 mg | Freq: Two times a day (BID) | INTRAMUSCULAR | Status: DC
  Administered 2015-08-04 – 2015-08-05 (×2): 40 mg via INTRAVENOUS
  Filled 2015-08-04 (×2): qty 4

## 2015-08-04 MED ORDER — MAGNESIUM SULFATE 2 GM/50ML IV SOLN
2.0000 g | Freq: Once | INTRAVENOUS | Status: AC
  Administered 2015-08-04: 2 g via INTRAVENOUS
  Filled 2015-08-04: qty 50

## 2015-08-04 MED ORDER — SODIUM CHLORIDE 0.9 % IV SOLN
500.0000 mg | Freq: Four times a day (QID) | INTRAVENOUS | Status: DC
  Administered 2015-08-04 – 2015-08-07 (×10): 500 mg via INTRAVENOUS
  Filled 2015-08-04 (×12): qty 500

## 2015-08-04 MED ORDER — SODIUM CHLORIDE 0.9% FLUSH
10.0000 mL | INTRAVENOUS | Status: DC | PRN
  Administered 2015-08-04: 30 mL
  Administered 2015-08-05: 10 mL
  Filled 2015-08-04 (×2): qty 40

## 2015-08-04 NOTE — Progress Notes (Signed)
Pt c/o fever/chills. Temp was taken rectally 101.2, supp tylenol was given and o2 sats were 86-88 on 5l venti mask. pts bp remained stable at 133/50. Pt was tachy on moinitor (pulse 122). resp 28. Pt stated she was going to vomit and not enough time to remove the mask. Pt was given zofran. md was notified, assessed pt and a stat chest xray was ordered. Results still pending. Pt stated she felt much better after vomiting. Pt placed back on ventimask at 5l. Will ctm

## 2015-08-04 NOTE — Evaluation (Signed)
Physical Therapy Evaluation Patient Details Name: JULIEN BERRYMAN MRN: [id number] DOB: 1955-06-11 Today's Date: 08/04/2015   History of Present Illness  Patient is a 60 y/o female admitted from CIR due to fever, hypotension, tachycardia and found to be septic with respiratory acidosis requiring BIPAP.  Was admitted initially on 06/29/15 with R MCA CVA due to mitral valve endocarditis, was also found to have lung mass.  Clinical Impression  Patient presents with decreased independence with mobility due to deficits listed in PT problem list below.  She will benefit from skilled PT in the acute setting to allow return home following SNF level rehab stay.    Follow Up Recommendations SNF    Equipment Recommendations  Wheelchair (measurements PT);Wheelchair cushion (measurements PT);3in1 (PT);Other (comment) (3:1 with padded seat and drop arm)    Recommendations for Other Services       Precautions / Restrictions Precautions Precautions: Fall Precaution Comments: Lt neglect , poor trunk control, decreased awareness      Mobility  Bed Mobility Overal bed mobility: Needs Assistance;+2 for physical assistance Bed Mobility: Rolling;Sidelying to Sit Rolling: Min guard Sidelying to sit: Max assist;+2 for physical assistance   Sit to supine: +2 for physical assistance;Total assist   General bed mobility comments: assist to roll to L reaching with R UE for rail with increased time; needed assist for trunk and legs to upright; to supine assisted quickly to lie flat due to after BSC>bed transfer pt unsafely too close to EOB  Transfers Overall transfer level: Needs assistance Equipment used: None Transfers: Squat Pivot Transfers     Squat pivot transfers: Max assist;+2 physical assistance     General transfer comment: able to reach with R UE to opposite rail on BSC; needed repositioning once on BSC due to c/o sitting on her hemorrhiod.  Back to bed to L with +2 A and pt unable to fully  pivot hips and sat close to EOB  Ambulation/Gait             General Gait Details: unable  Stairs            Wheelchair Mobility    Modified Rankin (Stroke Patients Only) Modified Rankin (Stroke Patients Only) Pre-Morbid Rankin Score: No symptoms Modified Rankin: Severe disability     Balance Overall balance assessment: Needs assistance Sitting-balance support: Feet supported Sitting balance-Leahy Scale: Poor Sitting balance - Comments: at least mod A for balance sitting EOB, able to position on BSC such that could sit without assist with pillows      Standing balance-Leahy Scale: Zero                               Pertinent Vitals/Pain Pain Assessment: Faces Faces Pain Scale: Hurts little more Pain Location: L shoulder and trunk with movement and use Pain Descriptors / Indicators: Aching;Numbness Pain Intervention(s): Monitored during session;Repositioned    Home Living Family/patient expects to be discharged to:: Skilled nursing facility Living Arrangements: Spouse/significant other Available Help at Discharge: Family Type of Home: Warehouse manager of Steps: 4 Home Layout: One level Home Equipment: None Additional Comments: Per family pt was very independent PTA     Prior Function Level of Independence: Independent         Comments: Worked as Air traffic controller.     Hand Dominance        Extremity/Trunk Assessment   Upper Extremity Assessment: LUE deficits/detail  LUE Deficits / Details: L LU edema, decreased awareness of position and active movement noted   Lower Extremity Assessment: LLE deficits/detail   LLE Deficits / Details: increased tone throughout and minimal active movement, though able to support some through L LE in weight bearing position  Cervical / Trunk Assessment: Other exceptions  Communication   Communication: No difficulties  Cognition Arousal/Alertness: Awake/alert Behavior During  Therapy: Impulsive Overall Cognitive Status: Impaired/Different from baseline Area of Impairment: Attention;Following commands;Safety/judgement;Memory   Current Attention Level: Sustained Memory: Decreased recall of precautions Following Commands: Follows multi-step commands inconsistently Safety/Judgement: Decreased awareness of deficits;Decreased awareness of safety     General Comments: attempts to move prior to safe set up, needs cues    General Comments      Exercises        Assessment/Plan    PT Assessment Patient needs continued PT services  PT Diagnosis Hemiplegia non-dominant side;Generalized weakness   PT Problem List Decreased strength;Decreased range of motion;Impaired tone;Decreased balance;Decreased mobility;Decreased coordination;Impaired sensation;Decreased activity tolerance;Decreased knowledge of use of DME;Decreased knowledge of precautions  PT Treatment Interventions DME instruction;Functional mobility training;Therapeutic activities;Therapeutic exercise;Wheelchair mobility training;Patient/family education;Neuromuscular re-education   PT Goals (Current goals can be found in the Care Plan section) Acute Rehab PT Goals Patient Stated Goal: To feel better PT Goal Formulation: With patient Time For Goal Achievement: 08/11/15 Potential to Achieve Goals: Fair    Frequency Min 3X/week   Barriers to discharge        Co-evaluation PT/OT/SLP Co-Evaluation/Treatment: Yes Reason for Co-Treatment: For patient/therapist safety PT goals addressed during session: Mobility/safety with mobility;Balance;Strengthening/ROM         End of Session   Activity Tolerance: Patient limited by fatigue Patient left: with bed alarm set;in bed           Time: 2446-2863 PT Time Calculation (min) (ACUTE ONLY): 24 min   Charges:   PT Evaluation $PT Eval Moderate Complexity: 1 Procedure     PT G CodesReginia Naas September 01, 2015, 11:29 AM  [name],  [place] 09/01/15

## 2015-08-04 NOTE — Progress Notes (Signed)
Patient ID: Becky Patterson, female   DOB: 11/18/1955, 60 y.o.   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 antiseptic oral rinse  7 mL Mouth Rinse q12n4p  . chlorhexidine  15 mL Mouth Rinse BID  . enoxaparin (LOVENOX) injection  40 mg Subcutaneous Q24H  . furosemide  40 mg Intravenous Q12H  . imipenem-cilastatin  500 mg Intravenous 4 times per day  . insulin aspart  0-9 Units Subcutaneous 6 times per day  . nicotine  14 mg Transdermal Daily  . saccharomyces boulardii  250 mg Oral BID    SUBJECTIVE: She states she is not sure when I asked her if she is feeling better. She has been moved out of the intensive care unit.  Review of Systems: Review of Systems  Unable to perform ROS: mental acuity    Past Medical History  Diagnosis Date  . Lung mass   . Bacterial endocarditis   . Splenic infarction   . Epistaxis     Social History  Substance Use Topics  . Smoking status: Current Every Day Smoker  . Smokeless tobacco: Not on file  . Alcohol Use: No    Family History  Problem Relation Age of Onset  . Lung cancer Mother   . Liver cancer Brother   . Heart attack Brother    Allergies  Allergen Reactions  . Codeine Nausea And Vomiting    OBJECTIVE: Filed Vitals:   08/04/15 0500 08/04/15 0508 08/04/15 1118 08/04/15 1503  BP:  103/48 96/62  106/59  Pulse:  98 95 97  Temp:  98.5 F (36.9 C) 98.6 F (37 C) 98.5 F (36.9 C)  TempSrc:  Axillary Oral Oral  Resp:  '20 16 16  '$ Weight: 185 lb 10 oz (84.2 kg)     SpO2:  100% 100% 97%   Body mass index is 35.09 kg/(m^2).  Physical Exam  Constitutional:  She is a little slow to answer questions and seems like she may still be slightly confused.  Cardiovascular: Normal rate and regular rhythm.   No murmur heard. Pulmonary/Chest: Breath sounds normal.  Abdominal: Soft. There is no tenderness.  Musculoskeletal: Normal range of motion.  No swelling, redness or warmth of her left shoulder.  Skin: No rash noted.    Lab Results Lab Results  Component Value Date   WBC 9.6 08/04/2015   HGB 8.1* 08/04/2015   HCT 27.4* 08/04/2015   MCV 93.5 08/04/2015   PLT 356 08/04/2015    Lab Results  Component Value Date   CREATININE 0.67 08/04/2015   BUN 10 08/04/2015   NA 142 08/04/2015   K 3.9 08/04/2015   CL 107 08/04/2015   CO2 25 08/04/2015    Lab Results  Component Value Date   ALT 20 08/01/2015   AST 28 08/01/2015   ALKPHOS 117 08/01/2015   BILITOT 0.8 08/01/2015     Microbiology: Recent Results (from the past 240 hour(s))  MRSA PCR Screening     Status: None   Collection Time: 07/31/15  6:25 AM  Result Value Ref Range Status   MRSA by PCR NEGATIVE NEGATIVE Final    Comment:        The GeneXpert MRSA Assay (FDA approved for NASAL specimens only), is one component of a comprehensive MRSA colonization surveillance program. It is not intended to diagnose MRSA infection nor to guide or monitor treatment for MRSA infections.   Culture, blood (routine x 2)     Status: None (Preliminary result)   Collection Time: 07/31/15  7:00 AM  Result Value Ref Range Status   Specimen Description BLOOD LEFT ARM  Final   Special Requests BOTTLES DRAWN AEROBIC AND ANAEROBIC 10CC  Final   Culture  Setup Time   Final    GRAM NEGATIVE RODS IN BOTH AEROBIC AND ANAEROBIC  BOTTLES CRITICAL RESULT CALLED TO, READ BACK BY AND VERIFIED WITH: H SATTERFIELD,RN AT 9211 08/01/15 BY L BENFIELD    Culture   Final    ENTEROBACTER CLOACAE SUSCEPTIBILITIES PERFORMED ON PREVIOUS CULTURE WITHIN THE LAST 5 DAYS.    Report Status PENDING  Incomplete  Urine culture     Status: None   Collection Time: 07/31/15  7:09 AM  Result Value Ref Range Status   Specimen Description URINE, CATHETERIZED  Final   Special Requests NONE  Final   Culture 30,000 COLONIES/mL YEAST  Final   Report Status 08/01/2015 FINAL  Final  Culture, blood (routine x 2)     Status: None   Collection Time: 07/31/15  7:10 AM  Result Value Ref Range Status   Specimen Description BLOOD LEFT HAND  Final   Special Requests BOTTLES DRAWN AEROBIC AND ANAEROBIC [place]  Final   Culture  Setup Time   Final    GRAM NEGATIVE RODS CRITICAL RESULT CALLED TO, READ BACK BY AND VERIFIED WITH: TO DS[place](RN) BY TCLEVELAND 08/01/15 AT 4:11AM IN BOTH AEROBIC AND ANAEROBIC BOTTLES    Culture ENTEROBACTER CLOACAE  Final   Report Status 08/04/2015 FINAL  Final   Organism ID, Bacteria ENTEROBACTER CLOACAE  Final      Susceptibility   Enterobacter cloacae - MIC*    CEFAZOLIN >=64 RESISTANT Resistant     CEFEPIME <=1 SENSITIVE Sensitive     CEFTAZIDIME >=64 RESISTANT Resistant     CEFTRIAXONE >=64 RESISTANT Resistant     CIPROFLOXACIN <=0.25 SENSITIVE Sensitive     GENTAMICIN <=1 SENSITIVE Sensitive     IMIPENEM <=0.25 SENSITIVE Sensitive     TRIMETH/SULFA <=20 SENSITIVE Sensitive     PIP/TAZO >=128 RESISTANT Resistant     * ENTEROBACTER CLOACAE  Culture, blood (x 2)     Status: None (Preliminary result)   Collection Time: 07/31/15  8:30 AM  Result Value Ref Range Status   Specimen Description BLOOD LEFT ARM  Final   Special Requests IN PEDIATRIC BOTTLE 3CC  Final   Culture NO GROWTH 4 DAYS  Final   Report Status PENDING  Incomplete  Culture, blood (x 2)     Status: None (Preliminary result)   Collection Time:  07/31/15  8:34 AM  Result Value Ref Range Status   Specimen Description BLOOD LEFT HAND  Final   Special Requests IN PEDIATRIC BOTTLE [facility]  Final  Culture NO GROWTH 4 DAYS  Final   Report Status PENDING  Incomplete  Cath Tip Culture     Status: None   Collection Time: 07/31/15 12:41 PM  Result Value Ref Range Status   Specimen Description CATH TIP PICC LINE  Final   Special Requests NONE  Final   Culture   Final    NO GROWTH 3 DAYS Performed at [facility]    Report Status 08/04/2015 FINAL  Final     ASSESSMENT: She developed recurrent Enterobacter bacteremia. Her new Enterobacter isolate has acquired resistance to ceftriaxone, ceftazidime and piperacillin tazobactam while on therapy with ceftriaxone. I believe reluctant to switch her to cefepime even though in vitro results suggest susceptibility. I will narrow imipenem to ciprofloxacin and plan on at least 2 more weeks of therapy.  PLAN: 1. Change imipenem to oral ciprofloxacin  Michel Bickers, MD [facility] for [place] [phone] pager   [phone] cell 08/04/2015, 3:55 PM

## 2015-08-04 NOTE — Evaluation (Addendum)
Occupational Therapy Evaluation Patient Details Name: Becky Patterson MRN: [id number] DOB: 1955/09/17 Today's Date: 08/04/2015    History of Present Illness Patient is a 60 y.o. female admitted from CIR due to fever, hypotension, tachycardia and found to be septic with respiratory acidosis requiring BIPAP.  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 PMH includes bacterial endocarditis, splenic infarction, epistaxis, back surgery, and lung mass.   Clinical Impression   Pt admitted with above. Pt independent with ADLs, prior to hospital admission, but was admitted from CIR. Feel pt will benefit from acute OT to increase independence prior to d/c.     Follow Up Recommendations  SNF    Equipment Recommendations  Other (comment) (defer to next venue)    Recommendations for Other Services       Precautions / Restrictions Precautions Precautions: Fall Precaution Comments: poor trunk control, decreased awareness Restrictions Weight Bearing Restrictions: No      Mobility Bed Mobility Overal bed mobility: Needs Assistance;+2 for physical assistance Bed Mobility: Rolling;Sidelying to Sit;Sit to Supine Rolling: Min guard;Mod assist Sidelying to sit: Max assist;+2 for physical assistance   Sit to supine: Total assist;+2 for physical assistance   General bed mobility comments: assist to roll to L reaching with R UE for rail with increased time; assist to roll to right; needed assist for trunk and legs to upright; to supine assisted quickly to lie flat due to after BSC>bed transfer pt unsafely too close to EOB  Transfers Overall transfer level: Needs assistance Transfers: Squat Pivot Transfers     Squat pivot transfers: Max assist;+2 physical assistance     General transfer comment: able to reach with R UE to opposite rail on BSC; needed repositioning once on BSC due to c/o sitting on her hemorrhiod.  Back to bed to L with +2 A and pt unable  to fully pivot hips and sat close to EOB    Balance Overall balance assessment: Needs assistance Sitting-balance support: Feet supported Sitting balance-Leahy Scale: Poor Sitting balance - Comments: at least mod A for balance sitting EOB, able to position on BSC such that could sit without assist with pillows                                 ADL Overall ADL's : Needs assistance/impaired                     Lower Body Dressing: Maximal assistance;Bed level;Sitting/lateral leans   Toilet Transfer: Maximal assistance;+2 for physical assistance;Squat-pivot           Functional mobility during ADLs: Maximal assistance;+2 for physical assistance (squat pivot)       Vision  noted pt head's turned to right; able to turn it to left when asked.   Perception     Praxis      Pertinent Vitals/Pain Pain Assessment: Faces Faces Pain Scale: Hurts little more Pain Location: left shoulder with movement and use and  and left side of abdomen Pain Descriptors / Indicators: Tingling Pain Intervention(s): Monitored during session;Repositioned     Hand Dominance     Extremity/Trunk Assessment Upper Extremity Assessment Upper Extremity Assessment: LUE deficits/detail LUE Deficits / Details: no active movement noted in LUE; edema noted in hand LUE Sensation: decreased light touch LUE Coordination: decreased fine motor;decreased gross motor   Lower Extremity Assessment Lower Extremity Assessment: Defer to PT evaluation LLE Deficits /  Details: increased tone throughout and minimal active movement, though able to support some through L LE in weight bearing position LLE Sensation: decreased light touch LLE Coordination: decreased fine motor;decreased gross motor   Cervical / Trunk Assessment Cervical / Trunk Assessment: Other exceptions Cervical / Trunk Exceptions: decreased awareness of upright with pushing at times to L   Communication Communication Communication:  No difficulties   Cognition Arousal/Alertness: Awake/alert Behavior During Therapy: Impulsive Overall Cognitive Status:  (unsure of baseline) Area of Impairment: Following commands;Safety/judgement   Following Commands: Follows multi-step commands inconsistently Safety/Judgement: Decreased awareness of safety;Decreased awareness of deficits     General Comments: attempts to move prior to safe set up, needs cues; noted head turned toward right    General Comments       Exercises       Shoulder Instructions      Home Living Family/patient expects to be discharged to:: Skilled nursing facility Living Arrangements: Spouse/significant other Available Help at Discharge: Family Type of Home: House Home Access: Ramped entrance Entrance Stairs-Number of Steps: [place]: One level     Bathroom Shower/Tub: Teacher, early years/pre: Handicapped height Bathroom Accessibility: Yes How Accessible: Accessible via walker Home Equipment: None   Additional Comments: Per family pt was very independent PTA   Lives With: Spouse    Prior Functioning/Environment Level of Independence: Independent        Comments: Worked as Air traffic controller.    OT Diagnosis: Hemiplegia non-dominant side   OT Problem List: Decreased strength;Decreased range of motion;Impaired balance (sitting and/or standing);Decreased activity tolerance;Decreased coordination;Decreased knowledge of use of DME or AE;Decreased safety awareness;Decreased cognition;Decreased knowledge of precautions;Pain;Impaired sensation;Impaired UE functional use;Increased edema   OT Treatment/Interventions: Self-care/ADL training;Therapeutic exercise;DME and/or AE instruction;Neuromuscular education;Therapeutic activities;Patient/family education;Balance training;Cognitive remediation/compensation;Visual/perceptual remediation/compensation    OT Goals(Current goals can be found in the care plan section) Acute Rehab OT  Goals Patient Stated Goal: to walk OT Goal Formulation: With patient/family Time For Goal Achievement: 08/11/15 Potential to Achieve Goals: Fair ADL Goals Pt Will Perform Grooming: with min assist;sitting Pt Will Perform Upper Body Bathing: with min assist;sitting Pt Will Transfer to Toilet: with mod assist;squat pivot transfer;bedside commode Pt Will Perform Toileting - Clothing Manipulation and hygiene: with mod assist;sitting/lateral leans Additional ADL Goal #1: Pt will maintain sitting balance EOB with Min assist as precursor for ADLs.  OT Frequency: Min 2X/week   Barriers to D/C:            Co-evaluation PT/OT/SLP Co-Evaluation/Treatment: Yes Reason for Co-Treatment: For patient/therapist safety PT goals addressed during session: Mobility/safety with mobility;Balance;Strengthening/ROM OT goals addressed during session: Other (comment) (mobility)      End of Session Equipment Utilized During Treatment: Oxygen  Activity Tolerance: Patient tolerated treatment well Patient left: in bed;with call bell/phone within reach;with bed alarm set   Time: 7322-0254 OT Time Calculation (min): 23 min Charges:  OT General Charges $OT Visit: 1 Procedure OT Evaluation $OT Eval Moderate Complexity: 1 Procedure G-CodesBenito Mccreedy OTR/L [phone] 08/04/2015, 2:15 PM

## 2015-08-04 NOTE — Progress Notes (Signed)
Pharmacy Antibiotic Note:  60 y.o. female admitted on 07/07/2015 with CVA and known enterobacter endocarditis. She was on Primaxin for GNR bacteremia but switched to PO ciprofloxacin today. Now pt says she is allergic to cipro. Will change back to previous dose of imipenem tonight till internal medicine gets ID to provide different po abx in am.  Albertina Parr, PharmD., BCPS Clinical Pharmacist Pager [phone]

## 2015-08-04 NOTE — Progress Notes (Addendum)
[facility] PROGRESS NOTE  Becky Patterson WNU:[id number] DOB: February 15, 1956 DOA: 07/31/2015  PCP: No primary care provider on file.  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 Had been in rehab on rocephin and making progress. Awoke w/ chills and hypotensive. Did not respond to IVF challenge. New septic shock 3/8-->source to be determined. Subsequently developed fluid overload.  Past medical history:  Past Medical History  Diagnosis Date  . Lung mass   . Bacterial endocarditis   . Splenic infarction   . Epistaxis     Consultants: Infectious disease  Procedures:  PICC line was removed 3/9 Left IJ central venous catheter placed 3/9  2-D echocardiogram Study Conclusions - Left ventricle: The cavity size was normal. There was mild concentric hypertrophy. Systolic function was normal. The estimated ejection fraction was in the range of 60% to 65%. Wall motion was normal; there were no regional wall motion abnormalities. Features are consistent with a pseudonormal left ventricular filling pattern, with concomitant abnormal relaxation and increased filling pressure (grade 2 diastolic dysfunction). Doppler parameters are consistent with elevated ventricular end-diastolic filling pressure. - Aortic valve: Trileaflet; normal thickness leaflets. There is mild thickening of the right coronary cusp. - Mitral valve: There is persistent thickening of the anterior mitral valve leaflet consistent with known vegetation. There was moderate regurgitation. - Left atrium: The atrium was mildly dilated. - Right ventricle: The cavity size was normal. Wall thickness was normal. Systolic function was normal. - Tricuspid valve: There was mild regurgitation. - Pulmonary arteries: Systolic pressure was within the normal range. - Inferior vena cava: The vessel was normal in size. - Pericardium, extracardiac: There was no pericardial effusion. Impressions: -  There is no significant change when compared to the prior study from 07/21/2015. Anterior mitral leaflet vegetation appears similar in size, MR is at least moderate.  Antibiotics: Currently on imipenem  Subjective: Patient was short of breath this morning. She couldn't sleep last night. Per family member who is at bedside. Sleepy this morning. Arousable. No nausea or vomiting.   Objective: Vital Signs  Filed Vitals:   08/03/15 2103 08/04/15 0500 08/04/15 0508 08/04/15 1118  BP: 132/76  103/48 96/62  Pulse: 110  98 95  Temp:   98.5 F (36.9 C) 98.6 F (37 C)  TempSrc:   Axillary Oral  Resp: '28  20 16  '$ Weight:  84.2 kg (185 lb 10 oz)    SpO2: 97%  100% 100%    Intake/Output Summary (Last 24 hours) at 08/04/15 1233 Last data filed at 08/04/15 1211  Gross per 24 hour  Intake    360 ml  Output    650 ml  Net   -290 ml   Filed Weights   08/01/15 0500 08/03/15 0309 08/04/15 0500  Weight: 84.7 kg (186 lb 11.7 oz) 81.421 kg (179 lb 8 oz) 84.2 kg (185 lb 10 oz)    General appearance: Somnolent this morning but arousable. In no distress. Resp: Diminished air entry at the bases. No crackles or wheezing. Cardio: regular rate and rhythm, S1, S2 normal. Systolic murmur appreciated over the precordium. GI: soft, non-tender; bowel sounds normal; no masses,  no organomegaly Extremities: extremities normal, atraumatic, no cyanosis or edema Neurologic: Awake and alert. Slightly distracted. Left-sided hemiparesis noted.  Lab Results:  Basic Metabolic Panel:  Recent Labs Lab 07/31/15 0630 08/01/15 0500 08/02/15 0630 08/03/15 0751 08/04/15 0605  NA 138 141 141 140 142  K 3.6 3.6 3.3* 3.4* 3.9  CL 103 104 106 104 107  CO2 '23 24 26 25 25  '$ GLUCOSE 141* 167* 119* 142* 129*  BUN '16 12 12 10 10  '$ CREATININE 0.86 0.71 0.61 0.62 0.67  CALCIUM 9.5 9.0 9.1 9.1 9.0  MG  --   --   --   --  1.5*   Liver Function Tests:  Recent Labs Lab 07/31/15 0630 08/01/15 0500  AST 33 28  ALT 20  20  ALKPHOS 127* 117  BILITOT 0.8 0.8  PROT 7.3 6.4*  ALBUMIN 2.8* 2.7*   CBC:  Recent Labs Lab 07/31/15 0515 07/31/15 0630 08/01/15 0500 08/02/15 0630 08/03/15 0751 08/04/15 0605  WBC 8.1 13.9* 18.9* 10.4 9.5 9.6  NEUTROABS 7.2 13.4*  --   --   --   --   HGB 10.1* 9.8* 8.3* 8.0* 8.6* 8.1*  HCT 31.8* 30.8* 27.4* 26.9* 28.1* 27.4*  MCV 94.4 93.6 92.6 92.8 91.8 93.5  PLT 364 334 375 319 345 356    CBG:  Recent Labs Lab 08/03/15 2359 08/04/15 0243 08/04/15 0503 08/04/15 0755 08/04/15 1209  GLUCAP 113* 118* 116* 151* 125*    Recent Results (from the past 240 hour(s))  MRSA PCR Screening     Status: None   Collection Time: 07/31/15  6:25 AM  Result Value Ref Range Status   MRSA by PCR NEGATIVE NEGATIVE Final    Comment:        The GeneXpert MRSA Assay (FDA approved for NASAL specimens only), is one component of a comprehensive MRSA colonization surveillance program. It is not intended to diagnose MRSA infection nor to guide or monitor treatment for MRSA infections.   Culture, blood (routine x 2)     Status: None (Preliminary result)   Collection Time: 07/31/15  7:00 AM  Result Value Ref Range Status   Specimen Description BLOOD LEFT ARM  Final   Special Requests BOTTLES DRAWN AEROBIC AND ANAEROBIC 10CC  Final   Culture  Setup Time   Final    GRAM NEGATIVE RODS IN BOTH AEROBIC AND ANAEROBIC BOTTLES CRITICAL RESULT CALLED TO, READ BACK BY AND VERIFIED WITH: H SATTERFIELD,RN AT 7124 08/01/15 BY L BENFIELD    Culture   Final    ENTEROBACTER CLOACAE SUSCEPTIBILITIES PERFORMED ON PREVIOUS CULTURE WITHIN THE LAST 5 DAYS.    Report Status PENDING  Incomplete  Urine culture     Status: None   Collection Time: 07/31/15  7:09 AM  Result Value Ref Range Status   Specimen Description URINE, CATHETERIZED  Final   Special Requests NONE  Final   Culture 30,000 COLONIES/mL YEAST  Final   Report Status 08/01/2015 FINAL  Final  Culture, blood (routine x 2)     Status:  None   Collection Time: 07/31/15  7:10 AM  Result Value Ref Range Status   Specimen Description BLOOD LEFT HAND  Final   Special Requests BOTTLES DRAWN AEROBIC AND ANAEROBIC [place]  Final   Culture  Setup Time   Final    GRAM NEGATIVE RODS CRITICAL RESULT CALLED TO, READ BACK BY AND VERIFIED WITH: TO DS[place](RN) BY TCLEVELAND 08/01/15 AT 4:11AM IN BOTH AEROBIC AND ANAEROBIC BOTTLES    Culture ENTEROBACTER CLOACAE  Final   Report Status 08/04/2015 FINAL  Final   Organism ID, Bacteria ENTEROBACTER CLOACAE  Final      Susceptibility   Enterobacter cloacae - MIC*    CEFAZOLIN >=64 RESISTANT Resistant     CEFEPIME <=1 SENSITIVE Sensitive     CEFTAZIDIME >=  64 RESISTANT Resistant     CEFTRIAXONE >=64 RESISTANT Resistant     CIPROFLOXACIN <=0.25 SENSITIVE Sensitive     GENTAMICIN <=1 SENSITIVE Sensitive     IMIPENEM <=0.25 SENSITIVE Sensitive     TRIMETH/SULFA <=20 SENSITIVE Sensitive     PIP/TAZO >=128 RESISTANT Resistant     * ENTEROBACTER CLOACAE  Culture, blood (x 2)     Status: None (Preliminary result)   Collection Time: 07/31/15  8:30 AM  Result Value Ref Range Status   Specimen Description BLOOD LEFT ARM  Final   Special Requests IN PEDIATRIC BOTTLE 3CC  Final   Culture NO GROWTH 4 DAYS  Final   Report Status PENDING  Incomplete  Culture, blood (x 2)     Status: None (Preliminary result)   Collection Time: 07/31/15  8:34 AM  Result Value Ref Range Status   Specimen Description BLOOD LEFT HAND  Final   Special Requests IN PEDIATRIC BOTTLE 2CC  Final   Culture NO GROWTH 4 DAYS  Final   Report Status PENDING  Incomplete  Cath Tip Culture     Status: None   Collection Time: 07/31/15 12:41 PM  Result Value Ref Range Status   Specimen Description CATH TIP PICC LINE  Final   Special Requests NONE  Final   Culture   Final    NO GROWTH 3 DAYS Performed at [facility]    Report Status 08/04/2015 FINAL  Final      Studies/Results: Dg Chest Port 1 View  08/04/2015   CLINICAL DATA:  Dyspnea. EXAM: PORTABLE CHEST 1 VIEW COMPARISON:  08/01/2015 chest radiograph. FINDINGS: Left internal jugular central venous catheter terminates in the upper third of the superior vena cava. Stable cardiomediastinal silhouette with mild cardiomegaly. No pneumothorax. Small bilateral pleural effusions, slightly increased bilaterally. Mild pulmonary edema, slightly worsened. Patchy bibasilar lung opacities appear stable. IMPRESSION: 1. Mild congestive heart failure, slightly worsened. 2. Small bilateral pleural effusions, slightly increased bilaterally. 3. Stable patchy bibasilar lung opacities, favor atelectasis. Electronically Signed   By: Ilona Sorrel M.D.   On: 08/04/2015 08:44    Medications:  Scheduled: . antiseptic oral rinse  7 mL Mouth Rinse q12n4p  . chlorhexidine  15 mL Mouth Rinse BID  . enoxaparin (LOVENOX) injection  40 mg Subcutaneous Q24H  . furosemide  40 mg Intravenous Q12H  . imipenem-cilastatin  500 mg Intravenous 4 times per day  . insulin aspart  0-9 Units Subcutaneous 6 times per day  . nicotine  14 mg Transdermal Daily  . saccharomyces boulardii  250 mg Oral BID   Continuous:   ZHG:[id number] **OR** acetaminophen, levalbuterol, loperamide, [DISCONTINUED] ondansetron **OR** ondansetron (ZOFRAN) IV, sodium chloride flush, traZODone  Assessment/Plan:  Principal Problem:   Sepsis with hypotension (HCC) Active Problems:   Mass of lower lobe of left lung   Anemia of chronic disease   Acute respiratory failure with hypoxia (HCC)   Dysphagia, post-stroke   Left hemiparesis (HCC)   Intracranial septic embolism (HCC)   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   Severe mitral regurgitation   Cerebrovascular accident (CVA) due to embolism of right middle cerebral artery (HCC)   Hyperglycemia   Septic shock (HCC)    Dyspnea, likely secondary to acute diastolic CHF Chest x-ray was obtained which shows evidence for pulmonary edema. Patient does  have diastolic dysfunction noted on echocardiogram. She did receive IV fluids in the ICU. Due to septic shock her diuretics were held. She'll be given intravenous Lasix. Strict ins and  outs. Daily weights. Since she does have significant left hemiplegia due to her stroke and need for his ins and outs, a Foley catheter will be placed.  Septic shock, likely due to Enterobacter bacteremia, unknown source Patient was transferred out of the intensive care unit to the floor after resolution of physiology. Blood pressures are stable. Blood culture report reviewed. Enterobacter is present. Resistant to ceftriaxone. Continue imipenem. Infectious disease to weigh in regarding long-term antibiotics.   Loose Stools  This is chronic for the patient. Her abdomen is benign. Imodium as needed. Florastor.  Recent bacterial endocarditis secondary to Enterobacter with mitral regurgitation Patient was at rehabilitation receiving ceftriaxone for the above. Blood cultures continued to show Enterobacter. Currently on imipenem for reasons stated above. Echocardiogram was repeated during this hospitalization does not show any significant changes to the vegetation compared to previous.  Acute hypercarbic respiratory failure Resolved. Oxygen as needed.  Dysphagia Speech therapy is following. Patient is on a dysphagia diet.  Anemia of chronic disease Hemoglobin is stable. Continue to monitor.  History of stroke due to septic embolization of the right middle cerebral artery Patient has left hemiparesis. PT and OT to continue. She does not need antithrombotic treatment.  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 Patient had previously refused biopsy. Outpatient management once she has completed rehabilitation.   ADDENDUM Called by the nurse around 4:30 PM stating that the patient was having chills and rigors. She apparently had an episode of vomiting. Rectal temperature was 101F. She was tachycardic. Blood pressure  was normal. Saturations could not be recorded. And I was told that the patient  was less responsive. When I arrived at the bedside in 10-15 minutes, patient was more awake. She had stopped shaking. Saturations were checked using another device and it was 95% on room air. Lungs were auscultated. Some diminished air entry at the bases. But no wheezing or rhonchi or crackles. Quite likely that the patient aspirated when she vomited. We will get a stat chest x-ray. Patient has improved after being given Tylenol and antibiotics. She remains slightly tachycardic in the 110s to 120s. This is most likely due to the fever. Monitor closely for now. If she does not improve or continues to get worse, we may have to transfer her to stepdown unit. But at this time she is stable. Discussed with her sister-in-law who was at the bedside.    DVT Prophylaxis: Lovenox    Code Status: DO NOT RESUSCITATE  Family Communication: Discussed with the patient and her sister Disposition Plan: Continue management as outlined above. IV diuretics. Await further input from ID. PT and OT to evaluate.    LOS: 4 days   [facility] Pager [phone] 08/04/2015, 12:33 PM  If 7PM-7AM, please contact night-coverage at [email or web address], password [facility]

## 2015-08-04 NOTE — Progress Notes (Signed)
Notified on call provider in regards to patient stating she develops a rash when taking Cipro. ID placed patient on PO Cipro 3/13. Patient has yet to receive antibiotic.

## 2015-08-05 DIAGNOSIS — I2690 Septic pulmonary embolism without acute cor pulmonale: Secondary | ICD-10-CM

## 2015-08-05 DIAGNOSIS — G8194 Hemiplegia, unspecified affecting left nondominant side: Secondary | ICD-10-CM

## 2015-08-05 LAB — CBC
HEMATOCRIT: 28.6 % — AB (ref 36.0–46.0)
Hemoglobin: 8.6 g/dL — ABNORMAL LOW (ref 12.0–15.0)
MCH: 28.1 pg (ref 26.0–34.0)
MCHC: 30.1 g/dL (ref 30.0–36.0)
MCV: 93.5 fL (ref 78.0–100.0)
PLATELETS: 307 10*3/uL (ref 150–400)
RBC: 3.06 MIL/uL — ABNORMAL LOW (ref 3.87–5.11)
RDW: 15.6 % — AB (ref 11.5–15.5)
WBC: 19 10*3/uL — AB (ref 4.0–10.5)

## 2015-08-05 LAB — CULTURE, BLOOD (ROUTINE X 2)
Culture: NO GROWTH
Culture: NO GROWTH

## 2015-08-05 LAB — BASIC METABOLIC PANEL
Anion gap: 15 (ref 5–15)
BUN: 14 mg/dL (ref 6–20)
CALCIUM: 9.5 mg/dL (ref 8.9–10.3)
CO2: 27 mmol/L (ref 22–32)
CREATININE: 0.84 mg/dL (ref 0.44–1.00)
Chloride: 98 mmol/L — ABNORMAL LOW (ref 101–111)
GFR calc Af Amer: >60 mL/min (ref >60)
GFR calc non Af Amer: >60 mL/min (ref >60)
GLUCOSE: 85 mg/dL (ref 65–99)
Potassium: 3.5 mmol/L (ref 3.5–5.1)
Sodium: 140 mmol/L (ref 135–145)

## 2015-08-05 LAB — MAGNESIUM: Magnesium: 1.6 mg/dL — ABNORMAL LOW (ref 1.7–2.4)

## 2015-08-05 LAB — GLUCOSE, CAPILLARY
GLUCOSE-CAPILLARY: 124 mg/dL — AB (ref 65–99)
GLUCOSE-CAPILLARY: 111 mg/dL — AB (ref 65–99)
GLUCOSE-CAPILLARY: 167 mg/dL — AB (ref 65–99)
GLUCOSE-CAPILLARY: 151 mg/dL — AB (ref 65–99)
Glucose-Capillary: 120 mg/dL — ABNORMAL HIGH (ref 65–99)
Glucose-Capillary: 155 mg/dL — ABNORMAL HIGH (ref 65–99)

## 2015-08-05 MED ORDER — POTASSIUM CHLORIDE 20 MEQ/15ML (10%) PO SOLN
40.0000 meq | Freq: Once | ORAL | Status: AC
  Administered 2015-08-05: 40 meq via ORAL
  Filled 2015-08-05: qty 30

## 2015-08-05 MED ORDER — ALUM & MAG HYDROXIDE-SIMETH 200-200-20 MG/5ML PO SUSP
15.0000 mL | ORAL | Status: DC | PRN
  Administered 2015-08-05: 15 mL via ORAL
  Filled 2015-08-05: qty 30

## 2015-08-05 MED ORDER — RESOURCE THICKENUP CLEAR PO POWD
ORAL | Status: DC | PRN
  Filled 2015-08-05: qty 125

## 2015-08-05 MED ORDER — POTASSIUM CHLORIDE CRYS ER 20 MEQ PO TBCR: 40.0000 meq | EXTENDED_RELEASE_TABLET | Freq: Once | ORAL | Status: DC

## 2015-08-05 MED ORDER — FUROSEMIDE 10 MG/ML IJ SOLN
40.0000 mg | Freq: Every day | INTRAMUSCULAR | Status: DC
  Administered 2015-08-05 – 2015-08-06 (×2): 40 mg via INTRAVENOUS
  Filled 2015-08-05 (×2): qty 4

## 2015-08-05 NOTE — Care Management Note (Signed)
Case Management Note  Patient Details  Name: Becky Patterson MRN: [id number] Date of Birth: 1955-08-06  Subjective/Objective:                 Patient admitted from [facility]. Will DC to SNF. CHF w/ fluid overload. IV Lasix.    Action/Plan:  Anticipate DC to SNF when medically stable as facilitated by CSW.  Expected Discharge Date:                  Expected Discharge Plan:  Skilled Nursing Facility  In-House Referral:  Clinical Social Work  Discharge planning Services  CM Consult  Post Acute Care Choice:    Choice offered to:     DME Arranged:    DME Agency:     HH Arranged:    [place] Agency:     Status of Service:  Completed, signed off  Medicare Important Message Given:    Date Medicare IM Given:    Medicare IM give by:    Date Additional Medicare IM Given:    Additional Medicare Important Message give by:     If discussed at [place] of Stay Meetings, dates discussed:    Additional Comments:  Carles Collet, RN 08/05/2015, 11:54 AM

## 2015-08-05 NOTE — Progress Notes (Signed)
Patient ID: Becky Patterson, female   DOB: 05-15-1956, 60 y.o.   MRN: [id number]         [facility]     Date of Admission:  07/31/2015   Total days of antibiotics 50        Day 6 imipenem  She states that she is feeling much better. She was given ciprofloxacin which was in the hospital previously and developed hives but it was not listed on her allergy list. I will continue imipenem for 8 more days. Hopefully this will be sufficient to completely cure her mitral valve endocarditis complicated by systemic emboli in more recent recurrent bout of Enterobacter bacteremia. She needs to stay on imipenem through 08/13/2015. I will sign off now.         Michel Bickers, MD [facility] [place] [facility] [phone] pager   [phone] cell 05/27/2015, 1:32 PM

## 2015-08-05 NOTE — Progress Notes (Addendum)
[facility] PROGRESS NOTE  Becky Patterson RJJ:[id number] DOB: 1955-09-22 DOA: 07/31/2015  PCP: No primary care provider on file.  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 Had been in rehab on rocephin and making progress. Awoke w/ chills and hypotensive. Did not respond to IVF challenge. New septic shock 3/8-->source to be determined. Subsequently developed fluid overload. Also had an episode of aspiration.  Past medical history:  Past Medical History  Diagnosis Date  . Lung mass   . Bacterial endocarditis   . Splenic infarction   . Epistaxis     Consultants: Infectious disease  Procedures:  PICC line was removed 3/9 Left IJ central venous catheter placed 3/9  2-D echocardiogram Study Conclusions - Left ventricle: The cavity size was normal. There was mild concentric hypertrophy. Systolic function was normal. The estimated ejection fraction was in the range of 60% to 65%. Wall motion was normal; there were no regional wall motion abnormalities. Features are consistent with a pseudonormal left ventricular filling pattern, with concomitant abnormal relaxation and increased filling pressure (grade 2 diastolic dysfunction). Doppler parameters are consistent with elevated ventricular end-diastolic filling pressure. - Aortic valve: Trileaflet; normal thickness leaflets. There is mild thickening of the right coronary cusp. - Mitral valve: There is persistent thickening of the anterior mitral valve leaflet consistent with known vegetation. There was moderate regurgitation. - Left atrium: The atrium was mildly dilated. - Right ventricle: The cavity size was normal. Wall thickness was normal. Systolic function was normal. - Tricuspid valve: There was mild regurgitation. - Pulmonary arteries: Systolic pressure was within the normal range. - Inferior vena cava: The vessel was normal in size. - Pericardium, extracardiac: There was no  pericardial effusion. Impressions: - There is no significant change when compared to the prior study from 07/21/2015. Anterior mitral leaflet vegetation appears similar in size, MR is at least moderate.  Antibiotics: Currently on imipenem  Subjective: Patient states that she is feeling better overall. Since of breath has improved. She denies any chest pain. No nausea, vomiting. Continues to have poor appetite.  Objective: Vital Signs  Filed Vitals:   08/05/15 0446 08/05/15 0503 08/05/15 0800 08/05/15 1319  BP: 96/53 90/51  107/49  Pulse: 97 97  99  Temp: 97.8 F (36.6 C)  97.5 F (36.4 C) 97.2 F (36.2 C)  TempSrc: Oral  Axillary Oral  Resp:    18  Weight:      SpO2: 87% 94%  90%    Intake/Output Summary (Last 24 hours) at 08/05/15 1336 Last data filed at 08/05/15 0224  Gross per 24 hour  Intake    150 ml  Output   2350 ml  Net  -2200 ml   Filed Weights   08/01/15 0500 08/03/15 0309 08/04/15 0500  Weight: 84.7 kg (186 lb 11.7 oz) 81.421 kg (179 lb 8 oz) 84.2 kg (185 lb 10 oz)    General appearance: Somnolent this morning but arousable. In no distress. Resp: Diminished air entry at the bases. No crackles or wheezing. Cardio: regular rate and rhythm, S1, S2 normal. Systolic murmur appreciated over the precordium. GI: soft, non-tender; bowel sounds normal; no masses,  no organomegaly Extremities: extremities normal, atraumatic, no cyanosis or edema Neurologic: Awake and alert. Slightly distracted. Left-sided hemiparesis noted.  Lab Results:  Basic Metabolic Panel:  Recent Labs Lab 08/01/15 0500 08/02/15 0630 08/03/15 0751 08/04/15 0605 08/05/15 0355  NA 141 141 140 142 140  K 3.6 3.3* 3.4* 3.9 3.5  CL 104 106 104 107 98*  CO2 '24 26 25 25 27  '$ GLUCOSE 167* 119* 142* 129* 85  BUN '12 12 10 10 14  '$ CREATININE 0.71 0.61 0.62 0.67 0.84  CALCIUM 9.0 9.1 9.1 9.0 9.5  MG  --   --   --  1.5* 1.6*   Liver Function Tests:  Recent Labs Lab 07/31/15 0630  08/01/15 0500  AST 33 28  ALT 20 20  ALKPHOS 127* 117  BILITOT 0.8 0.8  PROT 7.3 6.4*  ALBUMIN 2.8* 2.7*   CBC:  Recent Labs Lab 07/31/15 0515 07/31/15 0630 08/01/15 0500 08/02/15 0630 08/03/15 0751 08/04/15 0605  WBC 8.1 13.9* 18.9* 10.4 9.5 9.6  NEUTROABS 7.2 13.4*  --   --   --   --   HGB 10.1* 9.8* 8.3* 8.0* 8.6* 8.1*  HCT 31.8* 30.8* 27.4* 26.9* 28.1* 27.4*  MCV 94.4 93.6 92.6 92.8 91.8 93.5  PLT 364 334 375 319 345 356    CBG:  Recent Labs Lab 08/04/15 1959 08/04/15 2349 08/05/15 0501 08/05/15 0745 08/05/15 1150  GLUCAP 141* 226* 120* 124* 111*    Recent Results (from the past 240 hour(s))  MRSA PCR Screening     Status: None   Collection Time: 07/31/15  6:25 AM  Result Value Ref Range Status   MRSA by PCR NEGATIVE NEGATIVE Final    Comment:        The GeneXpert MRSA Assay (FDA approved for NASAL specimens only), is one component of a comprehensive MRSA colonization surveillance program. It is not intended to diagnose MRSA infection nor to guide or monitor treatment for MRSA infections.   Culture, blood (routine x 2)     Status: None   Collection Time: 07/31/15  7:00 AM  Result Value Ref Range Status   Specimen Description BLOOD LEFT ARM  Final   Special Requests BOTTLES DRAWN AEROBIC AND ANAEROBIC 10CC  Final   Culture  Setup Time   Final    GRAM NEGATIVE RODS IN BOTH AEROBIC AND ANAEROBIC BOTTLES CRITICAL RESULT CALLED TO, READ BACK BY AND VERIFIED WITH: H SATTERFIELD,RN AT 8315 08/01/15 BY L BENFIELD    Culture   Final    ENTEROBACTER CLOACAE SUSCEPTIBILITIES PERFORMED ON PREVIOUS CULTURE WITHIN THE LAST 5 DAYS.    Report Status 08/05/2015 FINAL  Final  Urine culture     Status: None   Collection Time: 07/31/15  7:09 AM  Result Value Ref Range Status   Specimen Description URINE, CATHETERIZED  Final   Special Requests NONE  Final   Culture 30,000 COLONIES/mL YEAST  Final   Report Status 08/01/2015 FINAL  Final  Culture, blood  (routine x 2)     Status: None   Collection Time: 07/31/15  7:10 AM  Result Value Ref Range Status   Specimen Description BLOOD LEFT HAND  Final   Special Requests BOTTLES DRAWN AEROBIC AND ANAEROBIC [place]  Final   Culture  Setup Time   Final    GRAM NEGATIVE RODS CRITICAL RESULT CALLED TO, READ BACK BY AND VERIFIED WITH: TO DS[place](RN) BY TCLEVELAND 08/01/15 AT 4:11AM IN BOTH AEROBIC AND ANAEROBIC BOTTLES    Culture ENTEROBACTER CLOACAE  Final   Report Status 08/04/2015 FINAL  Final   Organism ID, Bacteria ENTEROBACTER CLOACAE  Final      Susceptibility   Enterobacter cloacae - MIC*    CEFAZOLIN >=64 RESISTANT Resistant     CEFEPIME <=1 SENSITIVE Sensitive     CEFTAZIDIME >=64 RESISTANT Resistant  CEFTRIAXONE >=64 RESISTANT Resistant     CIPROFLOXACIN <=0.25 SENSITIVE Sensitive     GENTAMICIN <=1 SENSITIVE Sensitive     IMIPENEM <=0.25 SENSITIVE Sensitive     TRIMETH/SULFA <=20 SENSITIVE Sensitive     PIP/TAZO >=128 RESISTANT Resistant     * ENTEROBACTER CLOACAE  Culture, blood (x 2)     Status: None   Collection Time: 07/31/15  8:30 AM  Result Value Ref Range Status   Specimen Description BLOOD LEFT ARM  Final   Special Requests IN PEDIATRIC BOTTLE 3CC  Final   Culture NO GROWTH 5 DAYS  Final   Report Status 08/05/2015 FINAL  Final  Culture, blood (x 2)     Status: None   Collection Time: 07/31/15  8:34 AM  Result Value Ref Range Status   Specimen Description BLOOD LEFT HAND  Final   Special Requests IN PEDIATRIC BOTTLE [facility]  Final   Culture NO GROWTH 5 DAYS  Final   Report Status 08/05/2015 FINAL  Final  Cath Tip Culture     Status: None   Collection Time: 07/31/15 12:41 PM  Result Value Ref Range Status   Specimen Description CATH TIP PICC LINE  Final   Special Requests NONE  Final   Culture   Final    NO GROWTH 3 DAYS Performed at [facility]    Report Status 08/04/2015 FINAL  Final      Studies/Results: Dg Chest Port 1 View  08/04/2015   CLINICAL DATA:  Shortness of breath.  Aspiration? EXAM: PORTABLE CHEST 1 VIEW COMPARISON:  Chest x-ray from earlier same day and chest x-ray dated 08/01/2015. FINDINGS: The bibasilar opacities are not significantly changed compared to the previous exams. Cardiomegaly is stable. Left IJ central line stable in position with tip projected over the upper SVC. Central pulmonary vascular congestion and mild interstitial edema is unchanged. IMPRESSION: Stable chest x-ray. Persistent evidence of congestive heart failure with central pulmonary vascular congestion and mild bilateral interstitial edema. The bibasilar opacities, most likely a combination of small pleural effusions and atelectasis, appear stable. Electronically Signed   By: Franki Cabot M.D.   On: 08/04/2015 17:38   Dg Chest Port 1 View  08/04/2015  CLINICAL DATA:  Dyspnea. EXAM: PORTABLE CHEST 1 VIEW COMPARISON:  08/01/2015 chest radiograph. FINDINGS: Left internal jugular central venous catheter terminates in the upper third of the superior vena cava. Stable cardiomediastinal silhouette with mild cardiomegaly. No pneumothorax. Small bilateral pleural effusions, slightly increased bilaterally. Mild pulmonary edema, slightly worsened. Patchy bibasilar lung opacities appear stable. IMPRESSION: 1. Mild congestive heart failure, slightly worsened. 2. Small bilateral pleural effusions, slightly increased bilaterally. 3. Stable patchy bibasilar lung opacities, favor atelectasis. Electronically Signed   By: Ilona Sorrel M.D.   On: 08/04/2015 08:44    Medications:  Scheduled: . antiseptic oral rinse  7 mL Mouth Rinse q12n4p  . chlorhexidine  15 mL Mouth Rinse BID  . enoxaparin (LOVENOX) injection  40 mg Subcutaneous Q24H  . furosemide  40 mg Intravenous Daily  . imipenem-cilastatin  500 mg Intravenous 4 times per day  . insulin aspart  0-9 Units Subcutaneous 6 times per day  . nicotine  14 mg Transdermal Daily  . 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 She is diuresing. Weight has not been checked today. We'll repeat chest x-ray tomorrow. Continue Lasix once a day for now as blood pressures noted to be on the lower side. Patient had an episode of aspiration when she vomited yesterday afternoon. Currently stable. Patient does have diastolic dysfunction noted on echocardiogram. She did receive IV fluids in the ICU. Due to septic shock, her diuretics were held. Continue strict ins and outs. Daily weights. Since she does have significant left hemiplegia due to her stroke and need for his ins and outs, a Foley catheter had to be placed.  Septic shock, likely due to Enterobacter bacteremia, unknown source Patient was transferred out of the intensive care unit to the floor after resolution of physiology. Blood pressures are stable. Blood culture is growing Enterobacter. Resistant to ceftriaxone. Patient was changed over to ciprofloxacin yesterday. However, she reported an allergy to Cipro. She was changed back to imipenem last night. ID to weigh in again today. Imipenem will also likely  help with covering for anaerobes.    Recent bacterial endocarditis secondary to Enterobacter with mitral regurgitation Patient was at rehabilitation receiving ceftriaxone for the above. Blood cultures continued to show Enterobacter. Currently on imipenem for reasons stated above. Echocardiogram was repeated during this hospitalization does not show any significant changes to the vegetation compared to previous.  Acute hypoxic and hypercarbic respiratory failure Requires oxygen periodically. Required oxygen yesterday due to pulmonary edema. Now improved.  Dysphagia Speech therapy is following. Patient is on a dysphagia diet.  Anemia of chronic disease Hemoglobin is stable. Continue to monitor.  History of stroke due to septic embolization of the right middle cerebral artery Patient has left hemiparesis. PT and OT to continue. She does not need antithrombotic treatment.  Loose Stools  This is chronic for the patient. Her abdomen is benign. Imodium as needed. Florastor.  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 Patient had previously refused biopsy. Outpatient management once she has completed rehabilitation.   Informed by RN that she had left arm swelling. I re-evaluated patient. Patient doesn't think that she has left arm swelling. No obvious swelling noted. Will need repeat examination tomorrow. Monitor for now.  DVT Prophylaxis: Lovenox    Code Status: DO NOT RESUSCITATE  Family Communication: Discussed with the patient and her sister Disposition Plan: Continue management as outlined above. IV diuretics. Await further input from ID. Will go to SNF when ready for DC.    LOS: 5 days   [facility] Pager [phone] 08/05/2015, 1:36 PM  If 7PM-7AM, please contact night-coverage at [email or web address], password [facility]

## 2015-08-05 NOTE — Progress Notes (Signed)
Pt c/o heartburn this am. Pt also c/o left arm swelling with increased warmth. Elevated left arm on pillow above level of heart. Paged Dr [place] Pink regarding these issues. Waiting to hear from dr. Aletha Halim continue to monitor pt throughout day. Bed remains in lowest position and call bell is within reach.

## 2015-08-05 NOTE — Progress Notes (Signed)
CSW spoke with pt and pt dtr concerning SNF placement- CSW informed pt and dtr that we have been in contact with [facility] and they are planning on taking pt when medically stable  CSW will continue to follow  Domenica Reamer, [place] Worker [phone]

## 2015-08-06 ENCOUNTER — Inpatient Hospital Stay (HOSPITAL_COMMUNITY): Payer: 59

## 2015-08-06 LAB — BASIC METABOLIC PANEL
ANION GAP: 12 (ref 5–15)
BUN: 15 mg/dL (ref 6–20)
CALCIUM: 9.5 mg/dL (ref 8.9–10.3)
CO2: 28 mmol/L (ref 22–32)
Chloride: 99 mmol/L — ABNORMAL LOW (ref 101–111)
Creatinine, Ser: 0.76 mg/dL (ref 0.44–1.00)
GFR calc Af Amer: >60 mL/min (ref >60)
GLUCOSE: 114 mg/dL — AB (ref 65–99)
Potassium: 3.8 mmol/L (ref 3.5–5.1)
Sodium: 139 mmol/L (ref 135–145)

## 2015-08-06 LAB — GLUCOSE, CAPILLARY
GLUCOSE-CAPILLARY: 144 mg/dL — AB (ref 65–99)
GLUCOSE-CAPILLARY: 187 mg/dL — AB (ref 65–99)
Glucose-Capillary: 136 mg/dL — ABNORMAL HIGH (ref 65–99)
Glucose-Capillary: 148 mg/dL — ABNORMAL HIGH (ref 65–99)
Glucose-Capillary: 124 mg/dL — ABNORMAL HIGH (ref 65–99)

## 2015-08-06 LAB — CBC
HCT: 29.3 % — ABNORMAL LOW (ref 36.0–46.0)
Hemoglobin: 8.9 g/dL — ABNORMAL LOW (ref 12.0–15.0)
MCH: 28.3 pg (ref 26.0–34.0)
MCHC: 30.4 g/dL (ref 30.0–36.0)
MCV: 93 fL (ref 78.0–100.0)
PLATELETS: 339 10*3/uL (ref 150–400)
RBC: 3.15 MIL/uL — ABNORMAL LOW (ref 3.87–5.11)
RDW: 15.4 % (ref 11.5–15.5)
WBC: 18.1 10*3/uL — AB (ref 4.0–10.5)

## 2015-08-06 MED ORDER — SODIUM CHLORIDE 0.9 % IV SOLN
INTRAVENOUS | Status: DC
  Administered 2015-08-07: via INTRAVENOUS

## 2015-08-06 MED ORDER — IOHEXOL 300 MG/ML  SOLN
25.0000 mL | INTRAMUSCULAR | Status: AC
  Administered 2015-08-06 (×2): 25 mL via ORAL

## 2015-08-06 MED ORDER — TRAMADOL HCL 50 MG PO TABS
ORAL_TABLET | ORAL | Status: AC
Start: 2015-08-06 — End: 2015-08-06
  Filled 2015-08-06: qty 1

## 2015-08-06 MED ORDER — OXYCODONE HCL 5 MG PO TABS
5.0000 mg | ORAL_TABLET | Freq: Once | ORAL | Status: AC
  Administered 2015-08-06: 5 mg via ORAL
  Filled 2015-08-06: qty 1

## 2015-08-06 MED ORDER — SODIUM CHLORIDE 0.9% FLUSH
10.0000 mL | INTRAVENOUS | Status: DC | PRN
  Administered 2015-08-07: 10 mL
  Filled 2015-08-06: qty 40

## 2015-08-06 MED ORDER — TRAMADOL HCL 50 MG PO TABS
50.0000 mg | ORAL_TABLET | Freq: Four times a day (QID) | ORAL | Status: DC | PRN
  Administered 2015-08-06: 50 mg via ORAL

## 2015-08-06 MED ORDER — IOHEXOL 300 MG/ML  SOLN
100.0000 mL | Freq: Once | INTRAMUSCULAR | Status: AC | PRN
  Administered 2015-08-06: 100 mL via INTRAVENOUS

## 2015-08-06 NOTE — Progress Notes (Addendum)
[facility] PROGRESS NOTE  Becky Patterson TIW:[id number] DOB: 1955-09-28 DOA: 07/31/2015  PCP: No primary care provider on file.  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 Had been in rehab on rocephin and making progress. Awoke w/ chills and hypotensive. Did not respond to IVF challenge. New septic shock 3/8-->source to be determined. Subsequently developed fluid overload. Also had an episode of aspiration.  Past medical history:  Past Medical History  Diagnosis Date  . Lung mass   . Bacterial endocarditis   . Splenic infarction   . Epistaxis     Consultants: Infectious disease  Procedures:  PICC line was removed 3/9 Left IJ central venous catheter placed 3/9  2-D echocardiogram Study Conclusions - Left ventricle: The cavity size was normal. There was mild concentric hypertrophy. Systolic function was normal. The estimated ejection fraction was in the range of 60% to 65%. Wall motion was normal; there were no regional wall motion abnormalities. Features are consistent with a pseudonormal left ventricular filling pattern, with concomitant abnormal relaxation and increased filling pressure (grade 2 diastolic dysfunction). Doppler parameters are consistent with elevated ventricular end-diastolic filling pressure. - Aortic valve: Trileaflet; normal thickness leaflets. There is mild thickening of the right coronary cusp. - Mitral valve: There is persistent thickening of the anterior mitral valve leaflet consistent with known vegetation. There was moderate regurgitation. - Left atrium: The atrium was mildly dilated. - Right ventricle: The cavity size was normal. Wall thickness was normal. Systolic function was normal. - Tricuspid valve: There was mild regurgitation. - Pulmonary arteries: Systolic pressure was within the normal range. - Inferior vena cava: The vessel was normal in size. - Pericardium, extracardiac: There was no  pericardial effusion. Impressions: - There is no significant change when compared to the prior study from 07/21/2015. Anterior mitral leaflet vegetation appears similar in size, MR is at least moderate.  Antibiotics: Currently on imipenem  Subjective: Patient states that she is feeling better overall. Since of breath has improved. She denies any chest pain. No nausea, vomiting. Continues to have poor appetite.  Objective: Vital Signs  Filed Vitals:   08/05/15 1319 08/05/15 1500 08/05/15 2205 08/06/15 0611  BP: 107/49  101/58 102/49  Pulse: 99  99 106  Temp: 97.2 F (36.2 C)  99.3 F (37.4 C) 98.6 F (37 C)  TempSrc: Oral  Oral Axillary  Resp: '18  18 16  '$ Weight:  80.3 kg (177 lb 0.5 oz)  81.5 kg (179 lb 10.8 oz)  SpO2: 90%  95% 94%    Intake/Output Summary (Last 24 hours) at 08/06/15 1353 Last data filed at 08/06/15 0745  Gross per 24 hour  Intake    220 ml  Output   2300 ml  Net  -2080 ml   Filed Weights   08/04/15 0500 08/05/15 1500 08/06/15 0611  Weight: 84.2 kg (185 lb 10 oz) 80.3 kg (177 lb 0.5 oz) 81.5 kg (179 lb 10.8 oz)    General appearance: Somnolent this morning but arousable. In no distress. Resp: Diminished air entry at the bases. No crackles or wheezing. Cardio: regular rate and rhythm, S1, S2 normal. Systolic murmur appreciated over the precordium. GI: soft, non-tender; bowel sounds normal; no masses,  no organomegaly Extremities: extremities normal, atraumatic, no cyanosis or edema Neurologic: Awake and alert. Slightly distracted. Left-sided hemiparesis noted.  Lab Results:  Basic Metabolic Panel:  Recent Labs Lab 08/02/15 0630 08/03/15 0751 08/04/15 0605 08/05/15 0355 08/06/15 0312  NA 141 140 142  140 139  K 3.3* 3.4* 3.9 3.5 3.8  CL 106 104 107 98* 99*  CO2 '26 25 25 27 28  '$ GLUCOSE 119* 142* 129* 85 114*  BUN '12 10 10 14 15  '$ CREATININE 0.61 0.62 0.67 0.84 0.76  CALCIUM 9.1 9.1 9.0 9.5 9.5  MG  --   --  1.5* 1.6*  --    Liver Function  Tests:  Recent Labs Lab 07/31/15 0630 08/01/15 0500  AST 33 28  ALT 20 20  ALKPHOS 127* 117  BILITOT 0.8 0.8  PROT 7.3 6.4*  ALBUMIN 2.8* 2.7*   CBC:  Recent Labs Lab 07/31/15 0515 07/31/15 0630  08/02/15 0630 08/03/15 0751 08/04/15 0605 08/05/15 1529 08/06/15 0312  WBC 8.1 13.9*  < > 10.4 9.5 9.6 19.0* 18.1*  NEUTROABS 7.2 13.4*  --   --   --   --   --   --   HGB 10.1* 9.8*  < > 8.0* 8.6* 8.1* 8.6* 8.9*  HCT 31.8* 30.8*  < > 26.9* 28.1* 27.4* 28.6* 29.3*  MCV 94.4 93.6  < > 92.8 91.8 93.5 93.5 93.0  PLT 364 334  < > 319 345 356 307 339  < > = values in this interval not displayed.  CBG:  Recent Labs Lab 08/05/15 2032 08/05/15 2349 08/06/15 0405 08/06/15 0753 08/06/15 1202  GLUCAP 151* 155* 136* 148* 144*    Recent Results (from the past 240 hour(s))  MRSA PCR Screening     Status: None   Collection Time: 07/31/15  6:25 AM  Result Value Ref Range Status   MRSA by PCR NEGATIVE NEGATIVE Final    Comment:        The GeneXpert MRSA Assay (FDA approved for NASAL specimens only), is one component of a comprehensive MRSA colonization surveillance program. It is not intended to diagnose MRSA infection nor to guide or monitor treatment for MRSA infections.   Culture, blood (routine x 2)     Status: None   Collection Time: 07/31/15  7:00 AM  Result Value Ref Range Status   Specimen Description BLOOD LEFT ARM  Final   Special Requests BOTTLES DRAWN AEROBIC AND ANAEROBIC 10CC  Final   Culture  Setup Time   Final    GRAM NEGATIVE RODS IN BOTH AEROBIC AND ANAEROBIC BOTTLES CRITICAL RESULT CALLED TO, READ BACK BY AND VERIFIED WITH: H SATTERFIELD,RN AT 2458 08/01/15 BY L BENFIELD    Culture   Final    ENTEROBACTER CLOACAE SUSCEPTIBILITIES PERFORMED ON PREVIOUS CULTURE WITHIN THE LAST 5 DAYS.    Report Status 08/05/2015 FINAL  Final  Urine culture     Status: None   Collection Time: 07/31/15  7:09 AM  Result Value Ref Range Status   Specimen Description  URINE, CATHETERIZED  Final   Special Requests NONE  Final   Culture 30,000 COLONIES/mL YEAST  Final   Report Status 08/01/2015 FINAL  Final  Culture, blood (routine x 2)     Status: None   Collection Time: 07/31/15  7:10 AM  Result Value Ref Range Status   Specimen Description BLOOD LEFT HAND  Final   Special Requests BOTTLES DRAWN AEROBIC AND ANAEROBIC [place]  Final   Culture  Setup Time   Final    GRAM NEGATIVE RODS CRITICAL RESULT CALLED TO, READ BACK BY AND VERIFIED WITH: TO DSOUTHERLAND(RN) BY TCLEVELAND 08/01/15 AT 4:11AM IN BOTH AEROBIC AND ANAEROBIC BOTTLES    Culture ENTEROBACTER CLOACAE  Final   Report Status 08/04/2015  FINAL  Final   Organism ID, Bacteria ENTEROBACTER CLOACAE  Final      Susceptibility   Enterobacter cloacae - MIC*    CEFAZOLIN >=64 RESISTANT Resistant     CEFEPIME <=1 SENSITIVE Sensitive     CEFTAZIDIME >=64 RESISTANT Resistant     CEFTRIAXONE >=64 RESISTANT Resistant     CIPROFLOXACIN <=0.25 SENSITIVE Sensitive     GENTAMICIN <=1 SENSITIVE Sensitive     IMIPENEM <=0.25 SENSITIVE Sensitive     TRIMETH/SULFA <=20 SENSITIVE Sensitive     PIP/TAZO >=128 RESISTANT Resistant     * ENTEROBACTER CLOACAE  Culture, blood (x 2)     Status: None   Collection Time: 07/31/15  8:30 AM  Result Value Ref Range Status   Specimen Description BLOOD LEFT ARM  Final   Special Requests IN PEDIATRIC BOTTLE 3CC  Final   Culture NO GROWTH 5 DAYS  Final   Report Status 08/05/2015 FINAL  Final  Culture, blood (x 2)     Status: None   Collection Time: 07/31/15  8:34 AM  Result Value Ref Range Status   Specimen Description BLOOD LEFT HAND  Final   Special Requests IN PEDIATRIC BOTTLE [facility]  Final   Culture NO GROWTH 5 DAYS  Final   Report Status 08/05/2015 FINAL  Final  Cath Tip Culture     Status: None   Collection Time: 07/31/15 12:41 PM  Result Value Ref Range Status   Specimen Description CATH TIP PICC LINE  Final   Special Requests NONE  Final   Culture   Final    NO  GROWTH 3 DAYS Performed at [facility]    Report Status 08/04/2015 FINAL  Final      Studies/Results: Dg Chest Port 1 View  08/06/2015  CLINICAL DATA:  Stroke and cough. EXAM: PORTABLE CHEST 1 VIEW COMPARISON:  08/04/2015 FINDINGS: Left jugular central line tip is in the upper SVC region and unchanged. Improved aeration at the right lung base. Persistent densities at the left lung base are compatible with a small pleural effusion with some consolidation or atelectasis. There continues to be fullness in the central vascular structures. Heart size is within normal limits. Negative for pneumothorax. IMPRESSION: Improving aeration at the right lung base. Left basilar densities are compatible with pleural fluid and consolidation. There continues to be evidence for some central vascular congestion. Electronically Signed   By: Markus Daft M.D.   On: 08/06/2015 07:43   Dg Chest Port 1 View  08/04/2015  CLINICAL DATA:  Shortness of breath.  Aspiration? EXAM: PORTABLE CHEST 1 VIEW COMPARISON:  Chest x-ray from earlier same day and chest x-ray dated 08/01/2015. FINDINGS: The bibasilar opacities are not significantly changed compared to the previous exams. Cardiomegaly is stable. Left IJ central line stable in position with tip projected over the upper SVC. Central pulmonary vascular congestion and mild interstitial edema is unchanged. IMPRESSION: Stable chest x-ray. Persistent evidence of congestive heart failure with central pulmonary vascular congestion and mild bilateral interstitial edema. The bibasilar opacities, most likely a combination of small pleural effusions and atelectasis, appear stable. Electronically Signed   By: Franki Cabot M.D.   On: 08/04/2015 17:38    Medications:  Scheduled: . antiseptic oral rinse  7 mL Mouth Rinse q12n4p  . chlorhexidine  15 mL Mouth Rinse BID  . enoxaparin (LOVENOX) injection  40 mg Subcutaneous Q24H  . furosemide  40 mg Intravenous Daily  .  imipenem-cilastatin  500 mg Intravenous 4 times per  day  . insulin aspart  0-9 Units Subcutaneous 6 times per day  . nicotine  14 mg Transdermal Daily  . saccharomyces boulardii  250 mg Oral BID  . 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    repeat chest x-ray shows no PNA . discontinue Lasix  for now as blood pressures noted to be on the lower side. Patient had an episode of aspiration when she vomited  .  Patient does have diastolic dysfunction noted on echocardiogram. She did receive IV fluids in the ICU. Due to septic shock, her diuretics were held. Continue strict ins and outs. Daily weights. Since she does have significant left hemiplegia due to her stroke and need for his ins and outs, a Foley catheter had to be placed. White count increased on 3/14 due to suspected aspiration. Continue to follow for improvement in leukocytosis. No obvious new source of infection as of today  Diarrhea: had it 2 days ago,stopped after having imodium, dc imodium and r/o c diff as patient has been on longterm abx  CT to r/o toxic megacolon/colitis  Septic  shock, likely due to Enterobacter bacteremia, unknown source Patient was transferred out of the intensive care unit to the floor after resolution of physiology. Blood pressures are stable. Blood culture is growing Enterobacter. Resistant to ceftriaxone. Patient was changed over to ciprofloxacin   she reported an allergy to Cipro. She was changed back to imipenem for mitral valve endocarditis complicated by systemic emboli in more recent recurrent bout of Enterobacter bacteremia. She needs to stay on imipenem through 08/13/2015.  Recent bacterial endocarditis secondary to Enterobacter with mitral regurgitation Patient was at rehabilitation receiving ceftriaxone for the above. Blood cultures continued to show Enterobacter. Currently on imipenem for reasons stated above. Echocardiogram was repeated during this hospitalization does not show any significant changes to the vegetation compared to previous. Follow CBC closely   Acute hypoxic and hypercarbic respiratory failure Requires oxygen periodically. Required oxygen yesterday due to pulmonary edema. Now improved.  Dysphagia Speech therapy is following. Patient is on a dysphagia diet.  Anemia of chronic disease Hemoglobin is stable. Continue to monitor.  History of stroke due to septic embolization of the right middle cerebral artery Patient has left hemiparesis. PT and OT to continue. She does not need antithrombotic treatment.  Loose Stools  This is chronic for the patient. Her abdomen is benign. Imodium as needed. Florastor.  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 Patient had previously refused biopsy. Outpatient management once she has completed rehabilitation.      DVT Prophylaxis: Lovenox    Code Status: DO NOT RESUSCITATE  Family Communication: Discussed with the patient and her sister Disposition Plan: Anticipate  discharge to SNF tomorrow with resolution of leukocytosis    LOS: 6 days   [facility]  [facility] Pager [phone]  08/06/2015, 1:53 PM  If 7PM-7AM, please contact night-coverage at [email or web address], password [facility]

## 2015-08-06 NOTE — Progress Notes (Signed)
Peripherally Inserted Central Catheter/Midline Placement  The IV Nurse has discussed with the patient and/or persons authorized to consent for the patient, the purpose of this procedure and the potential benefits and risks involved with this procedure.  The benefits include less needle sticks, lab draws from the catheter and patient may be discharged home with the catheter.  Risks include, but not limited to, infection, bleeding, blood clot (thrombus formation), and puncture of an artery; nerve damage and irregular heat beat.  Alternatives to this procedure were also discussed.  PICC/Midline Placement Documentation  PICC Single Lumen 05/25/70 PICC Right Basilic 40 cm 0 cm (Active)  Indication for Insertion or Continuance of Line Home intravenous therapies (PICC only) 08/06/2015  4:00 PM  Exposed Catheter (cm) 0 cm 08/06/2015  4:00 PM  Dressing Change Due 08/13/15 08/06/2015  4:00 PM       Holley Bouche Renee 08/06/2015, 4:23 PM

## 2015-08-06 NOTE — Progress Notes (Signed)
Speech Language Pathology Treatment: Dysphagia  Patient Details Name: Becky Patterson MRN: [id number] DOB: 1956/02/24 Today's Date: 08/06/2015 Time: 1040-1050 SLP Time Calculation (min) (ACUTE ONLY): 10 min  Assessment / Plan / Recommendation Clinical Impression  Pt seen for brief dysphagia session as she just completed meal. One sip nectar thick liquids and bite Dys 3 diet did not reveal difficulties. Most recent MBS was 2/21. Spouse reports SLP on rehab had initiated trials thin water and regular food and re-admitted to ICU due to hypotension and tachycardia (not respiratory). Continue Dys 3 and nectar. Pt may be ready for repeat swallow assessment soon.   HPI HPI: 60 year old female patient with complex recent history. 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 Course was complicated by a large right MCA infarct/left hemiplegia. She was transferred to rehabilitation on 2/13 and was recovering well.  Was participating in dysphagia treatment; on dysphagia 3 diet with nectar-thick liquids.  MBS revealed mild oropharyngeal dysphagia. Was transferred to the ICU 3/9 with fever and hypotension and tachycardia, ABG showing acute respiratory acidosis and required BiPAP.      SLP Plan  Continue with current plan of care     Recommendations  Diet recommendations: Dysphagia 3 (mechanical soft);Nectar-thick liquid Liquids provided via: Cup;No straw Medication Administration: Crushed with puree Supervision: Patient able to self feed;Full supervision/cueing for compensatory strategies Compensations: Slow rate;Small sips/bites;Lingual sweep for clearance of pocketing Postural Changes and/or Swallow Maneuvers: Seated upright 90 degrees             Oral Care Recommendations: Oral care BID Follow up Recommendations: Inpatient Rehab Plan: Continue with current  plan of care     GO                Houston Siren 08/06/2015, 12:15 PM  Orbie Pyo Colvin Caroli.Ed [employer] [phone]

## 2015-08-06 NOTE — Progress Notes (Signed)
Physical Therapy Treatment Patient Details Name: Becky Patterson MRN: [id number] DOB: Jul 07, 1955 Today's Date: 08/06/2015    History of Present Illness Patient is a 60 y.o. female admitted from CIR due to fever, hypotension, tachycardia and found to be septic with respiratory acidosis requiring BIPAP.  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 PMH includes bacterial endocarditis, splenic infarction, epistaxis, back surgery, and lung mass.    PT Comments    Pt performed six min standing trial in sara plus standing frame.  Pt required assist for upper trunk control and B hip extension.  Pt weight bear on left forefoot requiring assist to bear weight through heel.  Pt required facilitation to guard and maintain LUE positioning.    Follow Up Recommendations  SNF     Equipment Recommendations  Wheelchair (measurements PT);Wheelchair cushion (measurements PT);3in1 (PT);Other (comment) (3:1 with paded seat and drop arm.  )    Recommendations for Other Services       Precautions / Restrictions Precautions Precautions: Fall Precaution Comments: Lt neglect, poor trunk control, decreased awareness. pushing to L (pushers syndrome?) Restrictions Weight Bearing Restrictions: No    Mobility  Bed Mobility Overal bed mobility: Needs Assistance;+2 for physical assistance Bed Mobility: Rolling;Sidelying to Sit;Sit to Supine Rolling: Total assist;+2 for physical assistance Sidelying to sit: Total assist;+2 for physical assistance Supine to sit: Max assist;+2 for physical assistance Sit to supine: Total assist;+2 for physical assistance   General bed mobility comments: Pt able to followcommands to initiate movement but unable to provided assistance.    Transfers Overall transfer level: Needs assistance Equipment used: None (attempted sit to stand with RW Pt unable to clear bottom required increased assist and sara plus used to promote weight bearing.  )    Sit to Stand: +2 physical assistance;Total assist (for partial sit to stand.  Pt required sara plus for standing trial x 6 min.  )         General transfer comment: Pt performed increased mobility with sit to stand transfer in lift equipment.  pt required cues for weightshifting R, heel weight bearing on L and PTA guarding LUE while tech facilitated upper trunk control with cues from PTA.    Ambulation/Gait             General Gait Details: unable   Stairs            Wheelchair Mobility    Modified Rankin (Stroke Patients Only)       Balance     Sitting balance-Leahy Scale: Poor Sitting balance - Comments: at least mod A for balance sitting EOB, able to hold to R rail and pull to improve sitting posture.  Pushing noted to L.       Standing balance-Leahy Scale: Zero (facilitated upper trunk ext and head ext in sara plus standing frame.  )                      Cognition Arousal/Alertness: Awake/alert Behavior During Therapy: Impulsive   Area of Impairment: Following commands;Safety/judgement   Current Attention Level: Sustained Memory: Decreased recall of precautions Following Commands: Follows one step commands with increased time Safety/Judgement: Decreased awareness of safety;Decreased awareness of deficits Awareness: Intellectual Problem Solving: Decreased initiation;Difficulty sequencing;Requires verbal cues;Requires tactile cues;Slow processing General Comments: attempts to move prior to safe set up, needs cues; noted head turned toward right     Exercises      General Comments  Pertinent Vitals/Pain Pain Assessment: Faces Pain Score: 5  Pain Location: L shoulder with movement.   Pain Intervention(s): Monitored during session;Repositioned    Home Living                      Prior Function            PT Goals (current goals can now be found in the care plan section) Acute Rehab PT Goals Patient Stated Goal: to  walk Potential to Achieve Goals: Fair Additional Goals Additional Goal #1: Patient to propel w/c 100' with S Progress towards PT goals: Progressing toward goals    Frequency  Min 3X/week    PT Plan Current plan remains appropriate    Co-evaluation             End of Session Equipment Utilized During Treatment: Gait belt Activity Tolerance: Patient limited by fatigue Patient left: in bed (in high position for PICC line placement.  )     Time: 6962-9528 PT Time Calculation (min) (ACUTE ONLY): 38 min  Charges:  $Therapeutic Activity: 23-37 mins $Neuromuscular Re-education: 8-22 mins                    G Codes:      Cristela Blue 08/18/15, 4:12 PM Governor Rooks, PTA pager [phone]

## 2015-08-07 ENCOUNTER — Inpatient Hospital Stay (HOSPITAL_COMMUNITY): Payer: 59

## 2015-08-07 DIAGNOSIS — Z515 Encounter for palliative care: Secondary | ICD-10-CM

## 2015-08-07 DIAGNOSIS — I5031 Acute diastolic (congestive) heart failure: Secondary | ICD-10-CM

## 2015-08-07 DIAGNOSIS — Z7189 Other specified counseling: Secondary | ICD-10-CM

## 2015-08-07 LAB — COMPREHENSIVE METABOLIC PANEL
ALT: 16 U/L (ref 14–54)
AST: 17 U/L (ref 15–41)
Albumin: 2.7 g/dL — ABNORMAL LOW (ref 3.5–5.0)
Alkaline Phosphatase: 89 U/L (ref 38–126)
Anion gap: 11 (ref 5–15)
BUN: 16 mg/dL (ref 6–20)
CALCIUM: 9.3 mg/dL (ref 8.9–10.3)
CO2: 30 mmol/L (ref 22–32)
CREATININE: 0.79 mg/dL (ref 0.44–1.00)
Chloride: 96 mmol/L — ABNORMAL LOW (ref 101–111)
GFR calc non Af Amer: >60 mL/min (ref >60)
GLUCOSE: 115 mg/dL — AB (ref 65–99)
Potassium: 3.6 mmol/L (ref 3.5–5.1)
Sodium: 137 mmol/L (ref 135–145)
Total Bilirubin: 0.7 mg/dL (ref 0.3–1.2)
Total Protein: 6.8 g/dL (ref 6.5–8.1)

## 2015-08-07 LAB — GLUCOSE, CAPILLARY
Glucose-Capillary: 101 mg/dL — ABNORMAL HIGH (ref 65–99)
Glucose-Capillary: 103 mg/dL — ABNORMAL HIGH (ref 65–99)
Glucose-Capillary: 137 mg/dL — ABNORMAL HIGH (ref 65–99)
Glucose-Capillary: 139 mg/dL — ABNORMAL HIGH (ref 65–99)
Glucose-Capillary: 194 mg/dL — ABNORMAL HIGH (ref 65–99)

## 2015-08-07 LAB — CBC
HEMATOCRIT: 29.2 % — AB (ref 36.0–46.0)
Hemoglobin: 8.7 g/dL — ABNORMAL LOW (ref 12.0–15.0)
MCH: 27.4 pg (ref 26.0–34.0)
MCHC: 29.8 g/dL — AB (ref 30.0–36.0)
MCV: 92.1 fL (ref 78.0–100.0)
Platelets: 364 10*3/uL (ref 150–400)
RBC: 3.17 MIL/uL — ABNORMAL LOW (ref 3.87–5.11)
RDW: 15.3 % (ref 11.5–15.5)
WBC: 15.5 10*3/uL — ABNORMAL HIGH (ref 4.0–10.5)

## 2015-08-07 MED ORDER — PROMETHAZINE HCL 6.25 MG/5ML PO SYRP: 25.0000 mg | ORAL_SOLUTION | Freq: Four times a day (QID) | ORAL | Status: AC | PRN

## 2015-08-07 MED ORDER — TRAZODONE HCL 50 MG PO TABS: 25.0000 mg | ORAL_TABLET | Freq: Every evening | ORAL | Status: AC | PRN

## 2015-08-07 MED ORDER — OXYCODONE HCL 5 MG PO TABS
5.0000 mg | ORAL_TABLET | ORAL | Status: DC | PRN
  Administered 2015-08-07: 5 mg via ORAL
  Filled 2015-08-07: qty 1

## 2015-08-07 MED ORDER — NICOTINE 14 MG/24HR TD PT24: 14.0000 mg | MEDICATED_PATCH | Freq: Every day | TRANSDERMAL | Status: AC

## 2015-08-07 MED ORDER — SODIUM CHLORIDE 0.9 % IV SOLN
1.0000 g | INTRAVENOUS | Status: DC
  Filled 2015-08-07: qty 1

## 2015-08-07 MED ORDER — OXYCODONE HCL 5 MG PO TABS: 5.0000 mg | ORAL_TABLET | ORAL | Status: AC | PRN

## 2015-08-07 MED ORDER — HEPARIN SOD (PORK) LOCK FLUSH 100 UNIT/ML IV SOLN
250.0000 [IU] | INTRAVENOUS | Status: AC | PRN
  Administered 2015-08-07: 250 [IU]

## 2015-08-07 NOTE — Progress Notes (Signed)
Verified with spouse in room ok to call [facility] and request pick up for 2:30. [facility] called for transport home.

## 2015-08-07 NOTE — Consult Note (Signed)
Consultation Note Date: 08/07/2015   Patient Name: Becky Patterson  DOB: [birth year]  MRN: [id number]  Age / Sex: 60 y.o., female  PCP: No primary care provider on file. Referring Physician: No att. 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  Prior to this admission, she was at rehab on antibiotics.  She developed fever and chills and was brought to the hospital.  She has continued to have embolic events including new splenic infarct.  Palliative consulted for goals of care.  Met with patient, her daughter, and her husband.  She report that the most important things to her are her family, being home, and seeing her dogs.  She reports meeting with Dr. Allyson Sabal this AM and has a clear understanding of her medical situation.  In light of this, her daughter states that she would like to work to get home with hospice support and the patient stated that this was her wish.  Contacts/Participants in Discussion: patient, dtr [name], husband Primary Decision Maker: patient Relationship to Patient self  SUMMARY OF RECOMMENDATIONS - DNR/DNI - She would like to go home with hospice support. - Consult placed to care management  Code Status/Advance Care Planning: DNR Code Status History    Date Active Date Inactive Code Status Order ID Comments User Context   07/31/2015  7:50 AM 08/07/2015  6:19 PM DNR [id number]  Samella Parr, NP Inpatient   07/31/2015  6:01 AM 07/31/2015  7:50 AM DNR [id number]  Ivor Costa, MD Inpatient   07/07/2015  4:05 PM 07/07/2015  4:05 PM DNR [id number]  Bary Leriche, PA-C Inpatient   07/07/2015  4:05 PM 07/31/2015  6:01 AM DNR [id number]  Bary Leriche, PA-C Inpatient   07/02/2015 12:51 PM 07/07/2015  4:05 PM DNR [id number]  Rush Farmer, MD Inpatient   06/29/2015  4:06 PM  07/02/2015 12:51 PM Full Code [id number]  Greta Doom, MD Inpatient   06/15/2015  9:48 PM 06/26/2015  3:29 PM Full Code [id number]  Edwin Dada, MD Inpatient    Questions for Most Recent Historical Code Status (Order [id number])    Question Answer Comment   In the event of cardiac or respiratory ARREST Do not call a "code blue"    In the event of cardiac or respiratory ARREST Do not perform Intubation, CPR, defibrillation or ACLS    In the event of cardiac or respiratory ARREST Use medication by any route, position, wound care, and other measures to relive pain and suffering. May use oxygen, suction and manual treatment of airway obstruction as needed for comfort.       Symptom Management:   Pain: Oxycodone as needed  Palliative Prophylaxis:   Bowel Regimen and Frequent Pain Assessment  Additional Recommendations (Limitations, Scope, Preferences):  Avoid Hospitalization  Psycho-social/Spiritual:  Support System: Strong Desire for further Chaplaincy support:ZNo Additional Recommendations: Grief/Bereavement Support  Prognosis: < 3 months  Discharge Planning: Home with Hospice   Chief Complaint/ Primary Diagnoses: Present on Admission:  . (Resolved) Sepsis ([place]) . Sepsis with hypotension ([place]) . Anemia of chronic disease . (Resolved) Enterococcal bacteremia . Acute respiratory failure with hypoxia ([place]) . Left hemiparesis ([place]) . Intracranial septic embolism ([place]) . Severe mitral regurgitation . 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 . Hyperglycemia  I have reviewed the medical record, interviewed the patient and family, and examined the patient. The following aspects are pertinent.  Past Medical History  Diagnosis Date  . Lung mass   . Bacterial endocarditis   . Splenic infarction   . Epistaxis    Social History   Social History  . Marital Status: Married    Spouse Name: N/A  . Number of Children: N/A  . Years of Education: N/A   Social  History Main Topics  . Smoking status: Current Every Day Smoker  . Smokeless tobacco: Not on file  . Alcohol Use: No  . Drug Use: No  . Sexual Activity: Not on file   Other Topics Concern  . Not on file   Social History Narrative   Family History  Problem Relation Age of Onset  . Lung cancer Mother   . Liver cancer Brother   . Heart attack Brother    Scheduled Meds: Continuous Infusions: PRN Meds:. Medications Prior to Admission:  Prior to Admission medications   Medication Sig Start Date End Date Taking? Authorizing Provider  acetaminophen (TYLENOL) 500 MG tablet Take 1,000 mg by mouth 2 (two) times daily as needed for mild pain or headache.   Yes Historical Provider, MD  ferrous sulfate 325 (65 FE) MG tablet Take 1 tablet (325 mg total) by mouth 2 (two) times daily with a meal. 06/26/15  Yes Nita Sells, MD  furosemide (LASIX) 40 MG tablet Take 1 tablet (40 mg total) by mouth daily. 06/26/15  Yes Nita Sells, MD  nicotine (NICODERM CQ - DOSED IN MG/24 HOURS) 14 mg/24hr patch Place 1 patch (14 mg total) onto the skin daily. 08/07/15   Reyne Dumas, MD  oxyCODONE (OXY IR/ROXICODONE) 5 MG immediate release tablet Take 1 tablet (5 mg total) by mouth every 4 (four) hours as needed for severe pain. 08/07/15   Reyne Dumas, MD  promethazine (PHENERGAN) 6.25 MG/5ML syrup Take 20 mLs (25 mg total) by mouth every 6 (six) hours as needed for nausea or vomiting. 08/07/15   Reyne Dumas, MD  traZODone (DESYREL) 50 MG tablet Take 0.5 tablets (25 mg total) by mouth at bedtime as needed for sleep. 08/07/15   Reyne Dumas, MD   Allergies  Allergen Reactions  . Ciprofloxacin Rash  . Codeine Nausea And Vomiting    Review of Systems Pain in foot, "tired" eyes, and poor sleep. Otherwise ROS negative  Physical Exam  General: Alert, awake, in no acute distress.  HEENT: No bruits, no goiter, no JVD Heart: Regular rate and rhythm. No murmur appreciated. Lungs: Good air movement,  clear Abdomen: Soft, nontender, nondistended, positive bowel sounds.  Ext: + edema Skin: Warm and dry Neuro: Grossly intact, nonfocal.  Vital Signs: BP 109/61 mmHg  Pulse 102  Temp(Src) 99.5 F (37.5 C) (Oral)  Resp 18  Wt 79 kg (174 lb 2.6 oz)  SpO2 91%  SpO2: SpO2: 91 % O2 Device:SpO2: 91 % O2 Flow Rate: .O2 Flow Rate (L/min): 4 L/min  IO: Intake/output summary:  Intake/Output Summary (Last 24 hours) at 08/07/15 2238 Last data filed at 08/07/15 7915  Gross per 24 hour  Intake    715 ml  Output      0 ml  Net    715 ml    LBM: Last BM Date: 08/07/15 Baseline Weight: Weight: 84.7 kg (186 lb 11.7 oz) Most recent weight: Weight: 79 kg (174 lb 2.6 oz)      Palliative Assessment/Data:  Flowsheet Rows        Most Recent Value   Intake Tab    Referral Department  Hospitalist  Unit at Time of Referral  Med/Surg Unit   Palliative Care Primary Diagnosis  Sepsis/Infectious Disease   Date Notified  08/07/15   Palliative Care Type  Return patient Palliative Care   Reason for referral  Clarify Goals of Care, Counsel Regarding Hospice   Date of Admission  07/31/15   Date first seen by Palliative Care  08/07/15   # of days Palliative referral response time  0 Day(s)   # of days IP prior to Palliative referral  7   Clinical Assessment    Palliative Performance Scale Score  50%   Pain Max last 24 hours  7   Pain Min Last 24 hours  2   Psychosocial & Spiritual Assessment    Palliative Care Outcomes    Patient/Family meeting held?  Yes   Who was at the meeting?  Patient, daughter [name], [place] regarding hospice, Completed durable DNR, Changed to focus on comfort, Transitioned to hospice   Palliative Care follow-up planned  Yes, Home   Actual Discharge Date  08/07/15 [home with hospice]      Additional Data Reviewed:  CBC:    Component Value Date/Time   WBC 15.5* 08/07/2015 0500   HGB 8.7* 08/07/2015 0500   HCT 29.2* 08/07/2015  0500   PLT 364 08/07/2015 0500   MCV 92.1 08/07/2015 0500   NEUTROABS 13.4* 07/31/2015 0630   LYMPHSABS 0.4* 07/31/2015 0630   MONOABS 0.1 07/31/2015 0630   EOSABS 0.0 07/31/2015 0630   BASOSABS 0.0 07/31/2015 0630   Comprehensive Metabolic Panel:    Component Value Date/Time   NA 137 08/07/2015 0500   K 3.6 08/07/2015 0500   CL 96* 08/07/2015 0500   CO2 30 08/07/2015 0500   BUN 16 08/07/2015 0500   CREATININE 0.79 08/07/2015 0500   GLUCOSE 115* 08/07/2015 0500   CALCIUM 9.3 08/07/2015 0500   AST 17 08/07/2015 0500   ALT 16 08/07/2015 0500   ALKPHOS 89 08/07/2015 0500   BILITOT 0.7 08/07/2015 0500   PROT 6.8 08/07/2015 0500   ALBUMIN 2.7* 08/07/2015 0500     Time In: 0840 Time Out: 1000 Time Total: 80 Greater than 50%  of this time was spent counseling and coordinating care related to the above assessment and plan.  Signed by: Micheline Rough, MD  Micheline Rough, MD  08/07/2015, 10:38 PM  Please contact Palliative Medicine Team phone at [phone] for questions and concerns.

## 2015-08-07 NOTE — Discharge Summary (Signed)
Physician Discharge Summary  Becky Patterson MRN: [id number] DOB/AGE: 1956/01/13 60 y.o.  PCP: No primary care provider on file.   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     ferrous sulfate 325 (65 FE) MG tablet  Take 1 tablet (325 mg total) by mouth 2 (two) times daily with a meal.     furosemide 40 MG tablet  Commonly known as:  LASIX  Take 1 tablet (40 mg total) by mouth daily.     nicotine 14 mg/24hr patch  Commonly known as:  NICODERM CQ - dosed in mg/24 hours  Place 1 patch (14 mg total) onto the skin daily.     oxyCODONE 5 MG immediate release tablet  Commonly known as:  Oxy IR/ROXICODONE  Take 1 tablet (5 mg total) by mouth every 4 (four) hours as needed for severe pain.     promethazine 6.25 MG/5ML syrup  Commonly known as:  PHENERGAN  Take 20 mLs (25 mg total) by mouth every 6 (six) hours as needed for  nausea or vomiting.     traZODone 50 MG tablet  Commonly known as:  DESYREL  Take 0.5 tablets (25 mg total) by mouth at bedtime as needed for sleep.         Discharge Condition: Poor prognosis   Discharge Instructions       Discharge Instructions    Diet - low sodium heart healthy    Complete by:  As directed      Increase activity slowly    Complete by:  As directed            Allergies  Allergen Reactions  . Ciprofloxacin Rash  . Codeine Nausea And Vomiting      Disposition: 04-Intermediate Care Facility   Consults:  Palliative care consultation Infectious disease     Significant Diagnostic Studies:  Dg Chest 2 View  08/07/2015  CLINICAL DATA:  Left-sided weakness EXAM: CHEST  2 VIEW COMPARISON:  08/06/2015 FINDINGS: Cardiac shadow remains enlarged. A new right-sided PICC line is noted at the cavoatrial junction. Small bilateral pleural effusions are again identified. Mild vascular congestion is seen. Some lower lobe densities are seen likely related atelectasis/infiltrate. IMPRESSION: Slight increase in right-sided pleural effusion. The remainder the exam is stable with the exception  of interval placement of the PICC line as described. Electronically Signed   By: Inez Catalina M.D.   On: 08/07/2015 08:10   Mr Jeri Cos [place] Contrast  07/22/2015  CLINICAL DATA:  Right-sided headache. 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 Unsuccessful thrombectomy. EXAM: MRI HEAD WITHOUT AND WITH CONTRAST TECHNIQUE: Multiplanar, multiecho pulse sequences of the brain and surrounding structures were obtained without and with intravenous contrast. CONTRAST:  10m MULTIHANCE GADOBENATE DIMEGLUMINE 529 MG/ML IV SOLN COMPARISON:  MR head 07/01/2015. FINDINGS: The patient was unable to remain motionless for the exam. Small or subtle lesions could be overlooked. Sequelae of previous RIGHT MCA territory infarction affecting  much of the temporal lobe, basal ganglia, frontal operculum, and anterior parietal lobes, now with pseudonormalization of much of the involved territory. Residual diffusion restriction within the periventricular white matter, lentiform nucleus, and adjacent deep white matter, follows an expected time course of superficial greater than deep normalization. No significant reperfusion hemorrhage. Minor T1 shortening in the RIGHT insula representing either laminar necrosis or gyriform tight petechial hemorrhage. Pseudonormalization of LEFT insular acute infarction. Post infusion, gyriform enhancement predominates. There are no features of enhancement to suggest brain abscess or meningitis. There does not appear to be significant recanalization of the RIGHT MCA or RIGHT ICA, based on lack of flow void. IMPRESSION: Expected time course of large RIGHT MCA territory and smaller LEFT MCA territory infarcts status post septic emboli to the brain with large vessel occlusion. No imaging features concerning for meningitis or brain abscess. Electronically Signed   By: JStaci RighterM.D.   On: 07/22/2015 19:37   Ct Abdomen Pelvis W Contrast  08/06/2015  CLINICAL DATA:  Nausea and vomiting EXAM: CT ABDOMEN AND PELVIS WITH CONTRAST TECHNIQUE: Multidetector CT imaging of the abdomen and pelvis was performed using the standard protocol following bolus administration of intravenous contrast. CONTRAST:  1042mOMNIPAQUE IOHEXOL 300 MG/ML  SOLN COMPARISON:  06/21/2015 FINDINGS: Lung bases demonstrate small bilateral pleural effusions and bilateral lower lobe consolidation new from the prior exam. Visualized on the first image is the known left lower lobe nodule. It measures approximately 16 mm but is incompletely evaluated on this exam. The gallbladder has been surgically removed. The liver, adrenal glands and pancreas are stable from the prior exam. A 1 cm left adrenal nodule is again seen. The spleen again shows wedge-shaped areas of  decreased enhancement consistent with prior splenic infarct. These changes have progressed in the inferior tip of the spleen consistent with increased ischemia. Some mild perisplenic inflammatory changes are noted. The kidneys demonstrate a nonobstructing stone in the midportion of the right kidney. This was not well appreciated on the prior exam. No obstructive changes are seen. Differential enhancement is noted in both kidneys consistent with the known history of previous pyelonephritis. The bladder is decompressed by Foley catheter. The uterus and ovaries are within normal limits. The appendix is not well appreciated although no inflammatory changes are seen. Diverticular change of the colon is noted without diverticulitis. Stable degenerative and postop rib changes in the lower lumbar spine are seen. IMPRESSION: Changes consistent with progressive splenic infarct in the inferior tip with some surrounding inflammatory change. New bilateral lower lobe consolidation with associated small effusions. Right renal calculus which was not well appreciated on the prior exam. It is not obstructive in nature. Abnormal enhancement pattern in both kidneys is noted worse on the left than the right consistent with the previous history of pyelonephritis.  Stable left lower lobe likely benign nodule. Electronically Signed   By: Inez Catalina M.D.   On: 08/06/2015 21:18   Dg Chest Port 1 View  08/06/2015  CLINICAL DATA:  Stroke and cough. EXAM: PORTABLE CHEST 1 VIEW COMPARISON:  08/04/2015 FINDINGS: Left jugular central line tip is in the upper SVC region and unchanged. Improved aeration at the right lung base. Persistent densities at the left lung base are compatible with a small pleural effusion with some consolidation or atelectasis. There continues to be fullness in the central vascular structures. Heart size is within normal limits. Negative for pneumothorax. IMPRESSION: Improving aeration at the right lung base. Left  basilar densities are compatible with pleural fluid and consolidation. There continues to be evidence for some central vascular congestion. Electronically Signed   By: Markus Daft M.D.   On: 08/06/2015 07:43   Dg Chest Port 1 View  08/04/2015  CLINICAL DATA:  Shortness of breath.  Aspiration? EXAM: PORTABLE CHEST 1 VIEW COMPARISON:  Chest x-ray from earlier same day and chest x-ray dated 08/01/2015. FINDINGS: The bibasilar opacities are not significantly changed compared to the previous exams. Cardiomegaly is stable. Left IJ central line stable in position with tip projected over the upper SVC. Central pulmonary vascular congestion and mild interstitial edema is unchanged. IMPRESSION: Stable chest x-ray. Persistent evidence of congestive heart failure with central pulmonary vascular congestion and mild bilateral interstitial edema. The bibasilar opacities, most likely a combination of small pleural effusions and atelectasis, appear stable. Electronically Signed   By: Franki Cabot M.D.   On: 08/04/2015 17:38   Dg Chest Port 1 View  08/04/2015  CLINICAL DATA:  Dyspnea. EXAM: PORTABLE CHEST 1 VIEW COMPARISON:  08/01/2015 chest radiograph. FINDINGS: Left internal jugular central venous catheter terminates in the upper third of the superior vena cava. Stable cardiomediastinal silhouette with mild cardiomegaly. No pneumothorax. Small bilateral pleural effusions, slightly increased bilaterally. Mild pulmonary edema, slightly worsened. Patchy bibasilar lung opacities appear stable. IMPRESSION: 1. Mild congestive heart failure, slightly worsened. 2. Small bilateral pleural effusions, slightly increased bilaterally. 3. Stable patchy bibasilar lung opacities, favor atelectasis. Electronically Signed   By: Ilona Sorrel M.D.   On: 08/04/2015 08:44   Portable Chest 1 View  08/01/2015  CLINICAL DATA:  Sepsis EXAM: PORTABLE CHEST 1 VIEW COMPARISON:  07/31/2015 FINDINGS: Left jugular line is again identified and stable in  appearance. The cardiac shadow is stable. Bilateral pleural effusions are noted. Rounded nodular density is again noted in the left mid lung. This is stable from multiple prior exams. No new focal abnormality is seen. IMPRESSION: No significant interval change from the prior exam. Electronically Signed   By: Inez Catalina M.D.   On: 08/01/2015 07:59   Dg Chest Port 1 View  07/31/2015  CLINICAL DATA:  Acute respiratory failure with hypoxia. Respiratory distress for 1 day. EXAM: PORTABLE CHEST 1 VIEW COMPARISON:  Radiograph earlier this day 1203 hour FINDINGS: The right upper extremity PICC has been removed. Left central line remains in place, tip in the region of the proximal SVC. Cardiomegaly is unchanged. Likely diminishing pleural effusions with diminishing obscuration of both diaphragms. Vascular congestion, perihilar and infrahilar edema, similar to prior. No pneumothorax. No new airspace disease. Left lung nodule is again seen. IMPRESSION: 1. Improving basilar aeration with diminishing pleural effusions. Vascular congestion and pulmonary edema, similar to prior exams. 2. Right upper extremity PICC has been removed. Left central line remains in place. Electronically Signed   By: Jeb Levering  M.D.   On: 07/31/2015 18:54   Dg Chest Port 1 View  07/31/2015  CLINICAL DATA:  Central line placement EXAM: PORTABLE CHEST 1 VIEW COMPARISON:  07/31/2015 FINDINGS: Cardiomediastinal silhouette is stable. Persistent central vascular congestion and mild perihilar and infrahilar interstitial edema. Small bilateral pleural effusion with bilateral basilar atelectasis or infiltrate. Nodular density left lower lobe again noted. Stable right arm PICC line position. There is new left IJ central line with tip in SVC. No pneumothorax. IMPRESSION: New left IJ central line with tip in SVC. No pneumothorax. Stable right arm PICC line position. Persistent mild congestion/ edema. Again noted small bilateral pleural effusion with  bilateral basilar atelectasis on infiltrate. Electronically Signed   By: Lahoma Crocker M.D.   On: 07/31/2015 12:38   Dg Chest Port 1 View  07/31/2015  CLINICAL DATA:  Shortness of breath.  Desaturation tonight. EXAM: PORTABLE CHEST 1 VIEW COMPARISON:  07/20/2015 FINDINGS: Cardiac enlargement with mild pulmonary vascular congestion. Infiltration or edema in the lung bases similar to previous study. Left lower lung nodule measuring 2 cm diameter has been present on previous studies. No blunting of costophrenic angles. No pneumothorax. Right PICC line with tip over the low SVC. Old left rib fracture. IMPRESSION: Cardiac enlargement with pulmonary vascular congestion. Infiltration or edema in the lung bases similar to previous studies. Persistent lung nodule on the left measuring 2 cm diameter. Electronically Signed   By: Lucienne Capers M.D.   On: 07/31/2015 05:06   Dg Chest Port 1 View  07/20/2015  CLINICAL DATA:  Hypoxia EXAM: PORTABLE CHEST 1 VIEW COMPARISON:  07/03/2015 FINDINGS: Cardiac shadow is stable. A right-sided PICC line is again seen in the distal superior vena cava. Increasing right medial lung base infiltrate is noted. Atelectatic changes are noted in the left retrocardiac region. IMPRESSION: Increasing right medial lung base infiltrate. Electronically Signed   By: Inez Catalina M.D.   On: 07/20/2015 21:27   Dg Swallowing Func-speech Pathology  07/15/2015  Objective Swallowing Evaluation: Type of Study: MBS-Modified Barium Swallow Study Patient Details Name: Becky Patterson MRN: [id number] Date of Birth: 01/16/1956 Today's Date: 07/15/2015 Time: SLP Start Time (ACUTE ONLY): 1340 SLP Stop Time (ACUTE ONLY): 1410 SLP Time Calculation (min) (ACUTE ONLY): 30 min Past Medical History: Past Medical History Diagnosis Date . Lung mass  . Bacterial endocarditis  . Splenic infarction  . Epistaxis  Past Surgical History: Past Surgical History Procedure Laterality Date . Back surgery   . Tee without cardioversion  N/A 06/24/2015   Procedure: TRANSESOPHAGEAL ECHOCARDIOGRAM (TEE);  Surgeon: Larey Dresser, MD;  Location: [facility];  Service: Cardiovascular;  Laterality: N/A; . Radiology with anesthesia N/A 06/29/2015   Procedure: RADIOLOGY WITH ANESTHESIA;  Surgeon: Luanne Bras, MD;  Location: [facility];  Service: Radiology;  Laterality: N/A; . Cholecystectomy   HPI: Becky Patterson is a 60 y.o. female history of infective endocarditis that was treated with discharge on 2/2, continuing on antibiotics at home, lung mass noted on that admission. Re-admitted with left sided weakness that progressed after admission. 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  Pt admitted to CIR on 07/07/15.  Tolerating Dys 3, honey thick liquids diet.  Repeat MBS ordered to objectively determine readiness to advance given improvements noted at bedside.  No Data Recorded Assessment / Plan / Recommendation CHL IP CLINICAL IMPRESSIONS 07/15/2015 Therapy Diagnosis Mild pharyngeal phase dysphagia;Mild oral phase dysphagia Clinical Impression Pt presents with improved swallowing function and now presents with a mild oropharyngeal  dysphagia with sensory>motor deficits.  Pt presents with left sided labial and lingual weakness which results in anterior loss of boluses, impaired mastication of solids, and prolonged posterior transit of boluses to the oropharynx.  Decreased pharyngeal sensation and base of tongue weakness resulted in poor bolus cohesion and premature spillage of materials into the pharynx with swallow response triggered at the vallecula with thickened liquids and solids, delayed to the pyriforms with thin liquids.  Delay in swallow initiation resulted in penetration of large boluses of nectar thick liquids and thin liquids.  Penetration of thin liquids was greater in amount and occurred deeper into the laryngeal vestibule, requiring increased cues for throat clear and extra swallows to clear.  Chin tuck ineffective  largely due to cognitive deficits impacting pt's ability to successfully time strategy during the swallow.   Given pt's significant cognitive deficits, limited mobility, and persistent lethargy, recommend conservative advancement to Dys 3, nectar thick liquids with full supervision for use of swallowing precautions.  Pt would also benefit from implementation of the water protocol to continue working towards liquids advancement.  Prognosis for advancement good given progress made thus far with continued ST interventions for trials of advanced consistencies and use of compensatory swallowing strategies.   Impact on safety and function Mild aspiration risk     Prognosis 07/03/2015 Prognosis for Safe Diet Advancement Good Barriers to Reach Goals -- Barriers/Prognosis Comment -- CHL IP DIET RECOMMENDATION 07/15/2015 SLP Diet Recommendations Dysphagia 3 (Mech soft) solids;Nectar thick liquid Liquid Administration via Cup;No straw Medication Administration Crushed with puree Compensations Slow rate;Small sips/bites;Lingual sweep for clearance of pocketing Postural Changes Seated upright at 90 degrees            CHL IP ORAL PHASE 07/15/2015 Oral Phase Impaired Oral - Pudding Teaspoon -- Oral - Pudding Cup -- Oral - Honey Teaspoon -- Oral - Honey Cup Left anterior bolus loss;Delayed oral transit;Decreased bolus cohesion;Premature spillage;Lingual/palatal residue Oral - Nectar Teaspoon -- Oral - Nectar Cup Left anterior bolus loss;Delayed oral transit;Lingual/palatal residue;Decreased bolus cohesion;Premature spillage Oral - Nectar Straw Lingual/palatal residue;Decreased bolus cohesion;Premature spillage Oral - Thin Teaspoon -- Oral - Thin Cup Left anterior bolus loss;Lingual/palatal residue;Delayed oral transit;Decreased bolus cohesion;Premature spillage Oral - Thin Straw Lingual/palatal residue;Decreased bolus cohesion;Premature spillage Oral - Puree Lingual/palatal residue;Delayed oral transit;Premature spillage Oral - Mech  Soft Lingual/palatal residue;Premature spillage;Delayed oral transit;Impaired mastication Oral - Regular -- Oral - Multi-Consistency -- Oral - Pill -- Oral Phase - Comment --  CHL IP PHARYNGEAL PHASE 07/15/2015 Pharyngeal Phase Impaired Pharyngeal- Pudding Teaspoon -- Pharyngeal -- Pharyngeal- Pudding Cup -- Pharyngeal -- Pharyngeal- Honey Teaspoon -- Pharyngeal -- Pharyngeal- Honey Cup Delayed swallow initiation-vallecula;Reduced tongue base retraction;Pharyngeal residue - valleculae Pharyngeal -- Pharyngeal- Nectar Teaspoon -- Pharyngeal -- Pharyngeal- Nectar Cup Delayed swallow initiation-vallecula;Reduced tongue base retraction;Pharyngeal residue - valleculae;Penetration/Aspiration during swallow Pharyngeal Material enters airway, remains ABOVE vocal cords then ejected out Pharyngeal- Nectar Straw -- Pharyngeal -- Pharyngeal- Thin Teaspoon -- Pharyngeal -- Pharyngeal- Thin Cup Delayed swallow initiation-pyriform sinuses;Reduced tongue base retraction;Penetration/Aspiration during swallow;Pharyngeal residue - valleculae;Pharyngeal residue - pyriform Pharyngeal Material enters airway, remains ABOVE vocal cords and not ejected out Pharyngeal- Thin Straw Delayed swallow initiation-pyriform sinuses;Reduced tongue base retraction;Penetration/Aspiration during swallow;Pharyngeal residue - valleculae;Pharyngeal residue - pyriform Pharyngeal Material enters airway, remains ABOVE vocal cords and not ejected out Pharyngeal- Puree Delayed swallow initiation-vallecula;Reduced tongue base retraction Pharyngeal -- Pharyngeal- Mechanical Soft Delayed swallow initiation-vallecula;Reduced tongue base retraction Pharyngeal -- Pharyngeal- Regular -- Pharyngeal -- Pharyngeal- Multi-consistency -- Pharyngeal -- Pharyngeal- Pill --  Pharyngeal -- Pharyngeal Comment --  CHL IP CERVICAL ESOPHAGEAL PHASE 07/03/2015 Cervical Esophageal Phase WFL Pudding Teaspoon -- Pudding Cup -- Honey Teaspoon -- Honey Cup -- Nectar Teaspoon -- Nectar Cup --  Nectar Straw -- Thin Teaspoon -- Thin Cup -- Thin Straw -- Puree -- Mechanical Soft -- Regular -- Multi-consistency -- Pill -- Cervical Esophageal Comment -- No flowsheet data found. Page, Selinda Orion 07/15/2015, 4:17 PM                 Filed Weights   08/05/15 1500 08/06/15 0611 08/07/15 0546  Weight: 80.3 kg (177 lb 0.5 oz) 81.5 kg (179 lb 10.8 oz) 79 kg (174 lb 2.6 oz)     Microbiology: Recent Results (from the past 240 hour(s))  MRSA PCR Screening     Status: None   Collection Time: 07/31/15  6:25 AM  Result Value Ref Range Status   MRSA by PCR NEGATIVE NEGATIVE Final    Comment:        The GeneXpert MRSA Assay (FDA approved for NASAL specimens only), is one component of a comprehensive MRSA colonization surveillance program. It is not intended to diagnose MRSA infection nor to guide or monitor treatment for MRSA infections.   Culture, blood (routine x 2)     Status: None   Collection Time: 07/31/15  7:00 AM  Result Value Ref Range Status   Specimen Description BLOOD LEFT ARM  Final   Special Requests BOTTLES DRAWN AEROBIC AND ANAEROBIC 10CC  Final   Culture  Setup Time   Final    GRAM NEGATIVE RODS IN BOTH AEROBIC AND ANAEROBIC BOTTLES CRITICAL RESULT CALLED TO, READ BACK BY AND VERIFIED WITH: H SATTERFIELD,RN AT 0034 08/01/15 BY L BENFIELD    Culture   Final    ENTEROBACTER CLOACAE SUSCEPTIBILITIES PERFORMED ON PREVIOUS CULTURE WITHIN THE LAST 5 DAYS.    Report Status 08/05/2015 FINAL  Final  Urine culture     Status: None   Collection Time: 07/31/15  7:09 AM  Result Value Ref Range Status   Specimen Description URINE, CATHETERIZED  Final   Special Requests NONE  Final   Culture 30,000 COLONIES/mL YEAST  Final   Report Status 08/01/2015 FINAL  Final  Culture, blood (routine x 2)     Status: None   Collection Time: 07/31/15  7:10 AM  Result Value Ref Range Status   Specimen Description BLOOD LEFT HAND  Final   Special Requests BOTTLES DRAWN AEROBIC AND ANAEROBIC  [place]  Final   Culture  Setup Time   Final    GRAM NEGATIVE RODS CRITICAL RESULT CALLED TO, READ BACK BY AND VERIFIED WITH: TO DS[place](RN) BY TC[place]AND 08/01/15 AT 4:11AM IN BOTH AEROBIC AND ANAEROBIC BOTTLES    Culture ENTEROBACTER CLOACAE  Final   Report Status 08/04/2015 FINAL  Final   Organism ID, Bacteria ENTEROBACTER CLOACAE  Final      Susceptibility   Enterobacter cloacae - MIC*    CEFAZOLIN >=64 RESISTANT Resistant     CEFEPIME <=1 SENSITIVE Sensitive     CEFTAZIDIME >=64 RESISTANT Resistant     CEFTRIAXONE >=64 RESISTANT Resistant     CIPROFLOXACIN <=0.25 SENSITIVE Sensitive     GENTAMICIN <=1 SENSITIVE Sensitive     IMIPENEM <=0.25 SENSITIVE Sensitive     TRIMETH/SULFA <=20 SENSITIVE Sensitive     PIP/TAZO >=128 RESISTANT Resistant     * ENTEROBACTER CLOACAE  Culture, blood (x 2)     Status: None   Collection Time: 07/31/15  8:30 AM  Result Value Ref Range Status   Specimen Description BLOOD LEFT ARM  Final   Special Requests IN PEDIATRIC BOTTLE 3CC  Final   Culture NO GROWTH 5 DAYS  Final   Report Status 08/05/2015 FINAL  Final  Culture, blood (x 2)     Status: None   Collection Time: 07/31/15  8:34 AM  Result Value Ref Range Status   Specimen Description BLOOD LEFT HAND  Final   Special Requests IN PEDIATRIC BOTTLE 2CC  Final   Culture NO GROWTH 5 DAYS  Final   Report Status 08/05/2015 FINAL  Final  Cath Tip Culture     Status: None   Collection Time: 07/31/15 12:41 PM  Result Value Ref Range Status   Specimen Description CATH TIP PICC LINE  Final   Special Requests NONE  Final   Culture   Final    NO GROWTH 3 DAYS Performed at [facility]    Report Status 08/04/2015 FINAL  Final       Blood Culture    Component Value Date/Time   SDES CATH TIP PICC LINE 07/31/2015 1241   SPECREQUEST NONE 07/31/2015 1241   CULT  07/31/2015 1241    NO GROWTH 3 DAYS Performed at [facility] 08/04/2015 FINAL 07/31/2015 1241       Labs: Results for orders placed or performed during the hospital encounter of 07/31/15 (from the past 48 hour(s))  CBC     Status: Abnormal   Collection Time: 08/05/15  3:29 PM  Result Value Ref Range   WBC 19.0 (H) 4.0 - 10.5 K/uL   RBC 3.06 (L) 3.87 - 5.11 MIL/uL   Hemoglobin 8.6 (L) 12.0 - 15.0 g/dL   HCT 28.6 (L) 36.0 - 46.0 %   MCV 93.5 78.0 - 100.0 fL   MCH 28.1 26.0 - 34.0 pg   MCHC 30.1 30.0 - 36.0 g/dL   RDW 15.6 (H) 11.5 - 15.5 %   Platelets 307 150 - 400 K/uL  Glucose, capillary     Status: Abnormal   Collection Time: 08/05/15  5:10 PM  Result Value Ref Range   Glucose-Capillary 167 (H) 65 - 99 mg/dL  Glucose, capillary     Status: Abnormal   Collection Time: 08/05/15  8:32 PM  Result Value Ref Range   Glucose-Capillary 151 (H) 65 - 99 mg/dL  Glucose, capillary     Status: Abnormal   Collection Time: 08/05/15 11:49 PM  Result Value Ref Range   Glucose-Capillary 155 (H) 65 - 99 mg/dL  CBC     Status: Abnormal   Collection Time: 08/06/15  3:12 AM  Result Value Ref Range   WBC 18.1 (H) 4.0 - 10.5 K/uL   RBC 3.15 (L) 3.87 - 5.11 MIL/uL   Hemoglobin 8.9 (L) 12.0 - 15.0 g/dL   HCT 29.3 (L) 36.0 - 46.0 %   MCV 93.0 78.0 - 100.0 fL   MCH 28.3 26.0 - 34.0 pg   MCHC 30.4 30.0 - 36.0 g/dL   RDW 15.4 11.5 - 15.5 %   Platelets 339 150 - 400 K/uL  Basic metabolic panel     Status: Abnormal   Collection Time: 08/06/15  3:12 AM  Result Value Ref Range   Sodium 139 135 - 145 mmol/L   Potassium 3.8 3.5 - 5.1 mmol/L   Chloride 99 (L) 101 - 111 mmol/L   CO2 28 22 - 32 mmol/L   Glucose, Bld 114 (H)  65 - 99 mg/dL   BUN 15 6 - 20 mg/dL   Creatinine, Ser 0.76 0.44 - 1.00 mg/dL   Calcium 9.5 8.9 - 10.3 mg/dL   GFR calc non Af Amer >60 >60 mL/min   GFR calc Af Amer >60 >60 mL/min    Comment: (NOTE) The eGFR has been calculated using the CKD EPI equation. This calculation has not been validated in all clinical situations. eGFR's persistently <60 mL/min signify possible  Chronic Kidney Disease.    Anion gap 12 5 - 15  Glucose, capillary     Status: Abnormal   Collection Time: 08/06/15  4:05 AM  Result Value Ref Range   Glucose-Capillary 136 (H) 65 - 99 mg/dL  Glucose, capillary     Status: Abnormal   Collection Time: 08/06/15  7:53 AM  Result Value Ref Range   Glucose-Capillary 148 (H) 65 - 99 mg/dL  Glucose, capillary     Status: Abnormal   Collection Time: 08/06/15 12:02 PM  Result Value Ref Range   Glucose-Capillary 144 (H) 65 - 99 mg/dL  Glucose, capillary     Status: Abnormal   Collection Time: 08/06/15  5:02 PM  Result Value Ref Range   Glucose-Capillary 124 (H) 65 - 99 mg/dL  Glucose, capillary     Status: Abnormal   Collection Time: 08/06/15  9:58 PM  Result Value Ref Range   Glucose-Capillary 187 (H) 65 - 99 mg/dL  Glucose, capillary     Status: Abnormal   Collection Time: 08/07/15 12:01 AM  Result Value Ref Range   Glucose-Capillary 194 (H) 65 - 99 mg/dL  Glucose, capillary     Status: Abnormal   Collection Time: 08/07/15  4:08 AM  Result Value Ref Range   Glucose-Capillary 101 (H) 65 - 99 mg/dL  Glucose, capillary     Status: Abnormal   Collection Time: 08/07/15  4:45 AM  Result Value Ref Range   Glucose-Capillary 103 (H) 65 - 99 mg/dL  CBC     Status: Abnormal   Collection Time: 08/07/15  5:00 AM  Result Value Ref Range   WBC 15.5 (H) 4.0 - 10.5 K/uL   RBC 3.17 (L) 3.87 - 5.11 MIL/uL   Hemoglobin 8.7 (L) 12.0 - 15.0 g/dL   HCT 29.2 (L) 36.0 - 46.0 %   MCV 92.1 78.0 - 100.0 fL   MCH 27.4 26.0 - 34.0 pg   MCHC 29.8 (L) 30.0 - 36.0 g/dL   RDW 15.3 11.5 - 15.5 %   Platelets 364 150 - 400 K/uL  Comprehensive metabolic panel     Status: Abnormal   Collection Time: 08/07/15  5:00 AM  Result Value Ref Range   Sodium 137 135 - 145 mmol/L   Potassium 3.6 3.5 - 5.1 mmol/L   Chloride 96 (L) 101 - 111 mmol/L   CO2 30 22 - 32 mmol/L   Glucose, Bld 115 (H) 65 - 99 mg/dL   BUN 16 6 - 20 mg/dL   Creatinine, Ser 0.79 0.44 - 1.00  mg/dL   Calcium 9.3 8.9 - 10.3 mg/dL   Total Protein 6.8 6.5 - 8.1 g/dL   Albumin 2.7 (L) 3.5 - 5.0 g/dL   AST 17 15 - 41 U/L   ALT 16 14 - 54 U/L   Alkaline Phosphatase 89 38 - 126 U/L   Total Bilirubin 0.7 0.3 - 1.2 mg/dL   GFR calc non Af Amer >60 >60 mL/min   GFR calc Af Amer >60 >60 mL/min  Comment: (NOTE) The eGFR has been calculated using the CKD EPI equation. This calculation has not been validated in all clinical situations. eGFR's persistently <60 mL/min signify possible Chronic Kidney Disease.    Anion gap 11 5 - 15  Glucose, capillary     Status: Abnormal   Collection Time: 08/07/15  8:00 AM  Result Value Ref Range   Glucose-Capillary 137 (H) 65 - 99 mg/dL     Lipid Panel     Component Value Date/Time   CHOL 64 06/30/2015 0430   TRIG 109 06/30/2015 0430   HDL 16* 06/30/2015 0430   CHOLHDL 4.0 06/30/2015 0430   VLDL 22 06/30/2015 0430   LDLCALC 26 06/30/2015 0430     Lab Results  Component Value Date   HGBA1C 6.3* 06/30/2015   HGBA1C 6.9* 06/17/2015     Lab Results  Component Value Date   LDLCALC 26 06/30/2015   CREATININE 0.79 08/07/2015     HPI :60 y.o. 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 She was treated with ciprofloxacin with a plan to continue it for 6 weeks through 07/29/2015. However, she developed a large, embolic right middle cerebral artery stroke and was readmitted on 06/29/2015. She developed a fever and rash around that time is felt to be due to ciprofloxacin and her therapy was changed to ceftriaxone. She slowly improved and was transferred to the rehabilitation unit. Her therapy was extended for a full 8 weeks with a plan to continue it through 08/11/2015. She was doing well until early this morning when she developed respiratory distress, hypoxia, wheezing and a temperature of 102.2. She was transferred here. After cultures were obtained her antibiotic therapy was empirically broadened to  vancomycin and imipenem. Her UA shows too numerous to count white blood cells and bacteria. A Foley catheter has been placed. Her old PICC line has been removed and she had new left IJ central line. Her chest x-ray shows stable cardiomegaly and vascular congestion. There are no obvious new infiltrates. She is not having any acute diarrhea. She has had some left shoulder pain for the past several weeks.   HOSPITAL COURSE: * Dyspnea, likely secondary to acute diastolic CHF probably in the setting of mitral valve regurgitation/endocarditis Patient is improving. repeat chest x-ray shows no PNA . Resume Lasix for comfort. Patient initially admitted to ICU. Due to septic shock, her diuretics were held.   Since she does have significant left hemiplegia due to her stroke and need for his ins and outs, a Foley catheter had to be placed. White count increased on 3/14 due to suspected aspiration, on further investigation this was found to be secondary to a splenic infarct with ongoing embolic phenomena.  Splenic infarct/HCAP Despite being on prolonged course of antibiotics, patient continues to have embolic events CT scan consistent with progressive splenic infarct in the inferior tip, consistent with patient's nausea and flank pain that developed on 3/14 Dr. Megan Salon from infectious disease recommended continuation of IV antibiotics through 3/22 However given that the patient is not a candidate for valve replacement based on Ivin Poot, MD, recommendations from 2/28, in the setting of her recent CVA, left hemiparesis, possible bronchogenic carcinoma, palliative care was consulted Patient has decided to stop antibiotic treatment and go home with hospice     Septic shock, likely due to Enterobacter bacteremia, source, likely her mitral valve Patient was transferred out of the intensive care unit to the floor after resolution of physiology. Blood pressures  are stable. Blood culture is growing  Enterobacter. Resistant to ceftriaxone. Patient was changed over to ciprofloxacin she reported an allergy to Cipro. 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 Blood cultures continued to show Enterobacter. Currently on imipenem for reasons stated above. Echocardiogram was repeated during this hospitalization does not show any significant changes to the vegetation compared to previous. However it is suspected that the patient has an ongoing embolic events and is not a candidate for mitral valve replacement . Previously found to have 1.5 cm vegetation of anterior leaflet of mitral valve   Acute hypoxic and hypercarbic respiratory failure Requires oxygen periodically. Required oxygen yesterday due to pulmonary edema. Will provide with home oxygen for comfort  Dysphagia Speech therapy is following. Patient is on a dysphagia diet.  Anemia of chronic disease Hemoglobin is stable. Continue to monitor.  History of stroke due to septic embolization of the right middle cerebral artery Patient has left hemiparesis. PT and OT to continue. She does not need antithrombotic treatment.  Loose Stools  This is chronic for the patient. Her abdomen is benign. Imodium as needed. Florastor.  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 Patient had previously refused biopsy.  Patient is being discharged home with hospice  Discharge Exam:    Blood pressure 109/61, pulse 102, temperature 99.5 F (37.5 C), temperature source Oral, resp. rate 18, weight 79 kg (174 lb 2.6 oz), SpO2 91 %.  Constitutional: She is oriented to person, place, and time.  She is alert and in no distress. Her daughter is at the bedside.   Eyes: Conjunctivae are normal.  Cardiovascular: Normal rate and regular rhythm.  No murmur heard. Pulmonary/Chest: Breath sounds normal. She has no wheezes. She has no rales.  Abdominal: Soft. She exhibits no mass. There is no tenderness.  Musculoskeletal:  There is no redness, swelling or warmth of her left shoulder.  Neurological: She is alert and oriented to person, place, and time.  Skin: No rash noted.  Her right arm PICC has been removed.  Psychiatric:  She has a flat affect.       SignedReyne Dumas 08/07/2015, 11:57 AM        Time spent >45 mins

## 2015-08-07 NOTE — Progress Notes (Signed)
Patient is currently NPO because of a possible TEE.  I do not see any note about a TEE from Dr. Allyson Sabal and the patient and family stated that they were not informed about the TEE.  I paged Dr. Allyson Sabal this morning to address patients NPO status to make sure she is going to have the TEE done today.  I also asked if Dr Allyson Sabal will come see patient and family to discuss plan of care.  I do see in a note that patient may be discharged today to SNF.

## 2015-08-07 NOTE — Progress Notes (Signed)
   08/07/15 1101  PT Visit Information  Last PT Received On 08/07/15  Reason Eval/Treat Not Completed Medical issues which prohibited therapy;Fatigue/lethargy limiting ability to participate;Pain limiting ability to participate (Pt presents with medical decline and now plans to d/c home with hospice.  RN, husband, daughter, Immunologist and PTA present.  Case manager reports home hospice nurse will set up all equipment for home.  PT to sign off and husband agreeable. )  Will inform supervising PT to sign off.

## 2015-08-07 NOTE — Progress Notes (Signed)
I met with Becky Patterson, her husband, and her daughter, [name].  We discussed her current clinical situation and she stated that she wants to go home.  We talked about hospice in the home, and she would like to pursue discharge home with hospice support.  Full consult note to follow.  Micheline Rough, MD [place] Team [phone]

## 2015-08-07 NOTE — Care Management Note (Signed)
Case Management Note  Patient Details  Name: Becky Patterson MRN: [id number] Date of Birth: Jun 03, 1955  Subjective/Objective:                 Family would like to pursue home hospice. Choice offered, daughter and husband chose [facility]. Referral made to Elita Boone, liaison w/ Bl[facility] Friend. All requested info fxed to [phone]. Relayed to [facility] that patient currently has a PICC and a FC, Bambi verified that patient would be accepted home with both in place if MD chose to DC patient with them. PICC can be removed by hospice RN in the future if necessary. Relayed to [facility] that patient is not on oxygen currently but will likely need it soon, and that daughter [name] identified pt needing a hospital bed and [facility] transport. [name] stated that hospital bed can be delivered within a few hours of patient arriving home. With permission from [facility] Arms was given her number to call to assess home DME needs. [name] stated that she could accept her mom home around or after 3:00pm. CM updated Dr Allyson Sabal that home hospice arrangements have been made, and pt ready for discharge as she sees fit.    Action/Plan:  Will call [facility] (verified they go to pt's address) when DC order placed.  Expected Discharge Date:                  Expected Discharge Plan:  Home w Hospice Care  In-House Referral:  Clinical Social Work, Hospice / Palliative Care  Discharge planning Services  CM Consult  Post Acute Care Choice:  Durable Medical Equipment, Home Health Choice offered to:  Spouse, Adult Children  DME Arranged:    DME Agency:     HH Arranged:    [place] Agency:   ([facility])  Status of Service:  Completed, signed off  Medicare Important Message Given:    Date Medicare IM Given:    Medicare IM give by:    Date Additional Medicare IM Given:    Additional Medicare Important Message give by:     If discussed at [place] of Stay Meetings, dates discussed:    Additional Comments:  Carles Collet, RN 08/07/2015,  11:27 AM

## 2015-08-14 ENCOUNTER — Encounter: Payer: 59 | Admitting: Obstetrics & Gynecology

## 2015-09-22 DEATH — deceased

## 2016-11-04 IMAGING — CR DG CHEST 1V PORT
2 series · 2 of 2 positions shown · non-contrast
Comparison: 06/30/2015

CLINICAL DATA: Check endotracheal tube placement

EXAM:
PORTABLE CHEST 1 VIEW

[AP (1 of 2)]
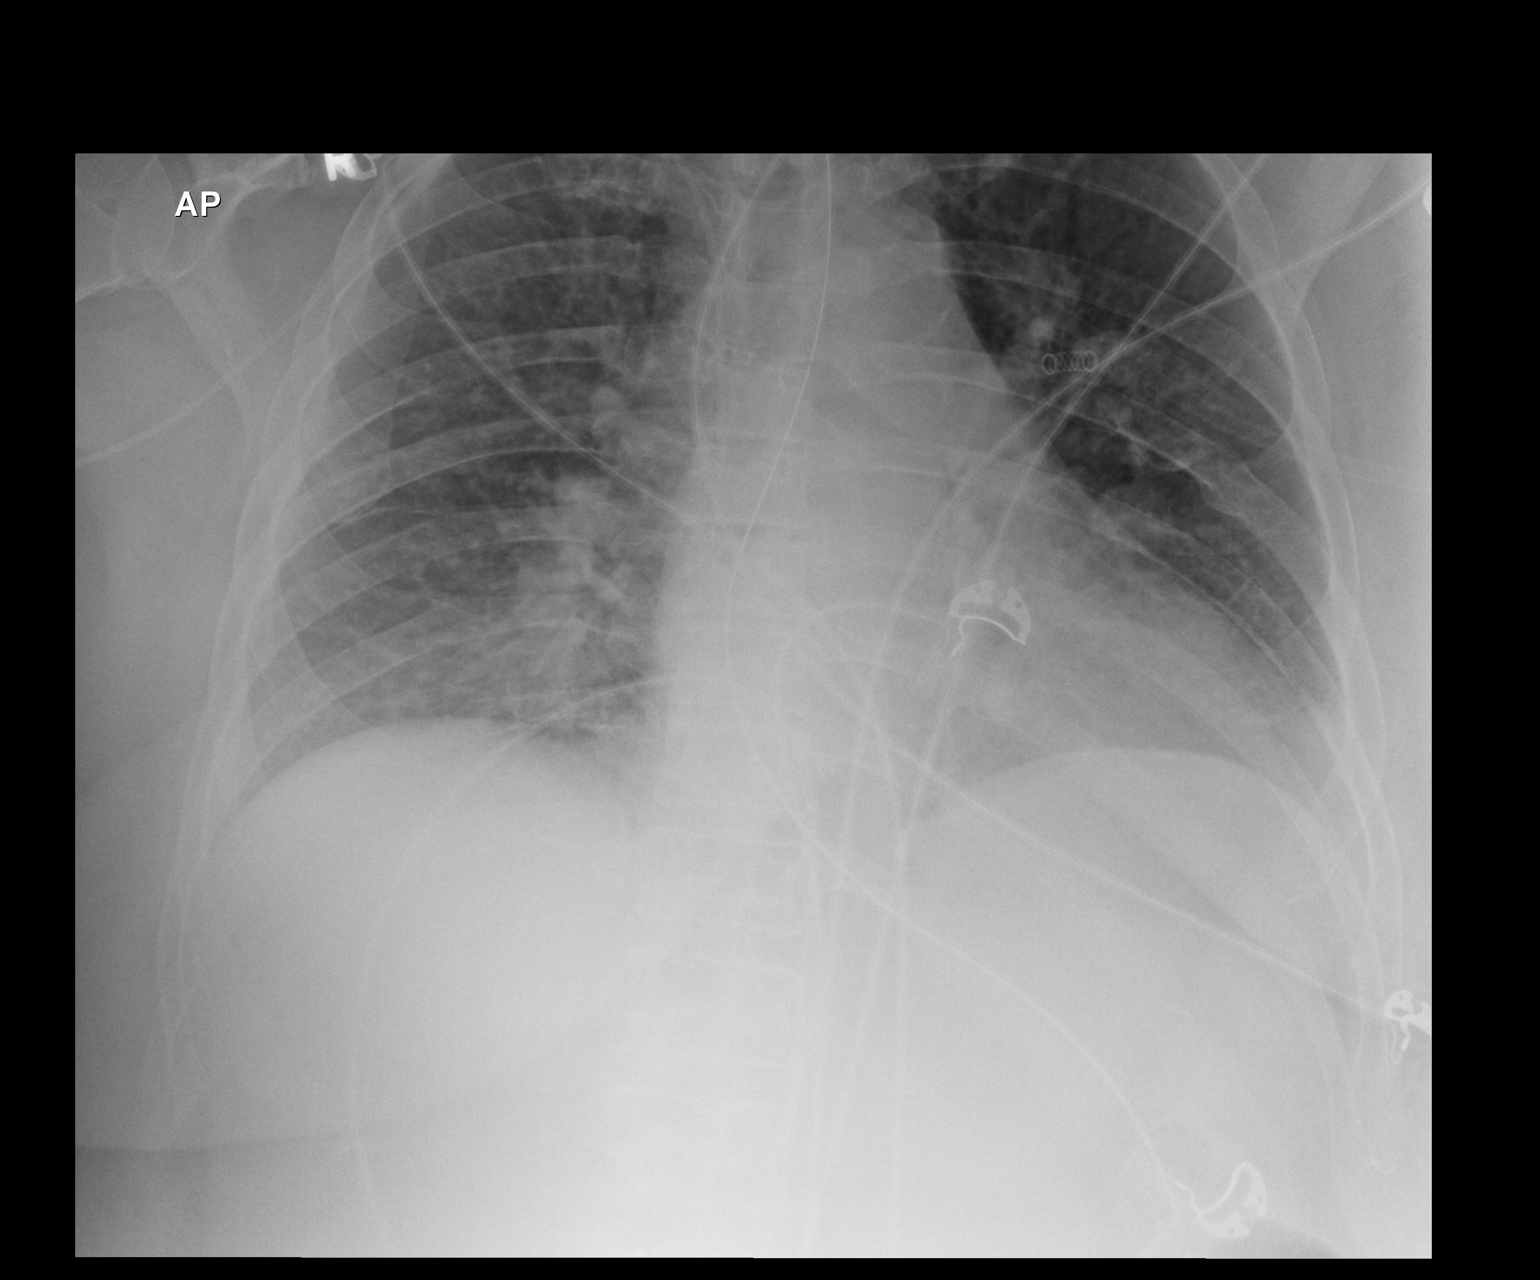

[AP (2 of 2)]
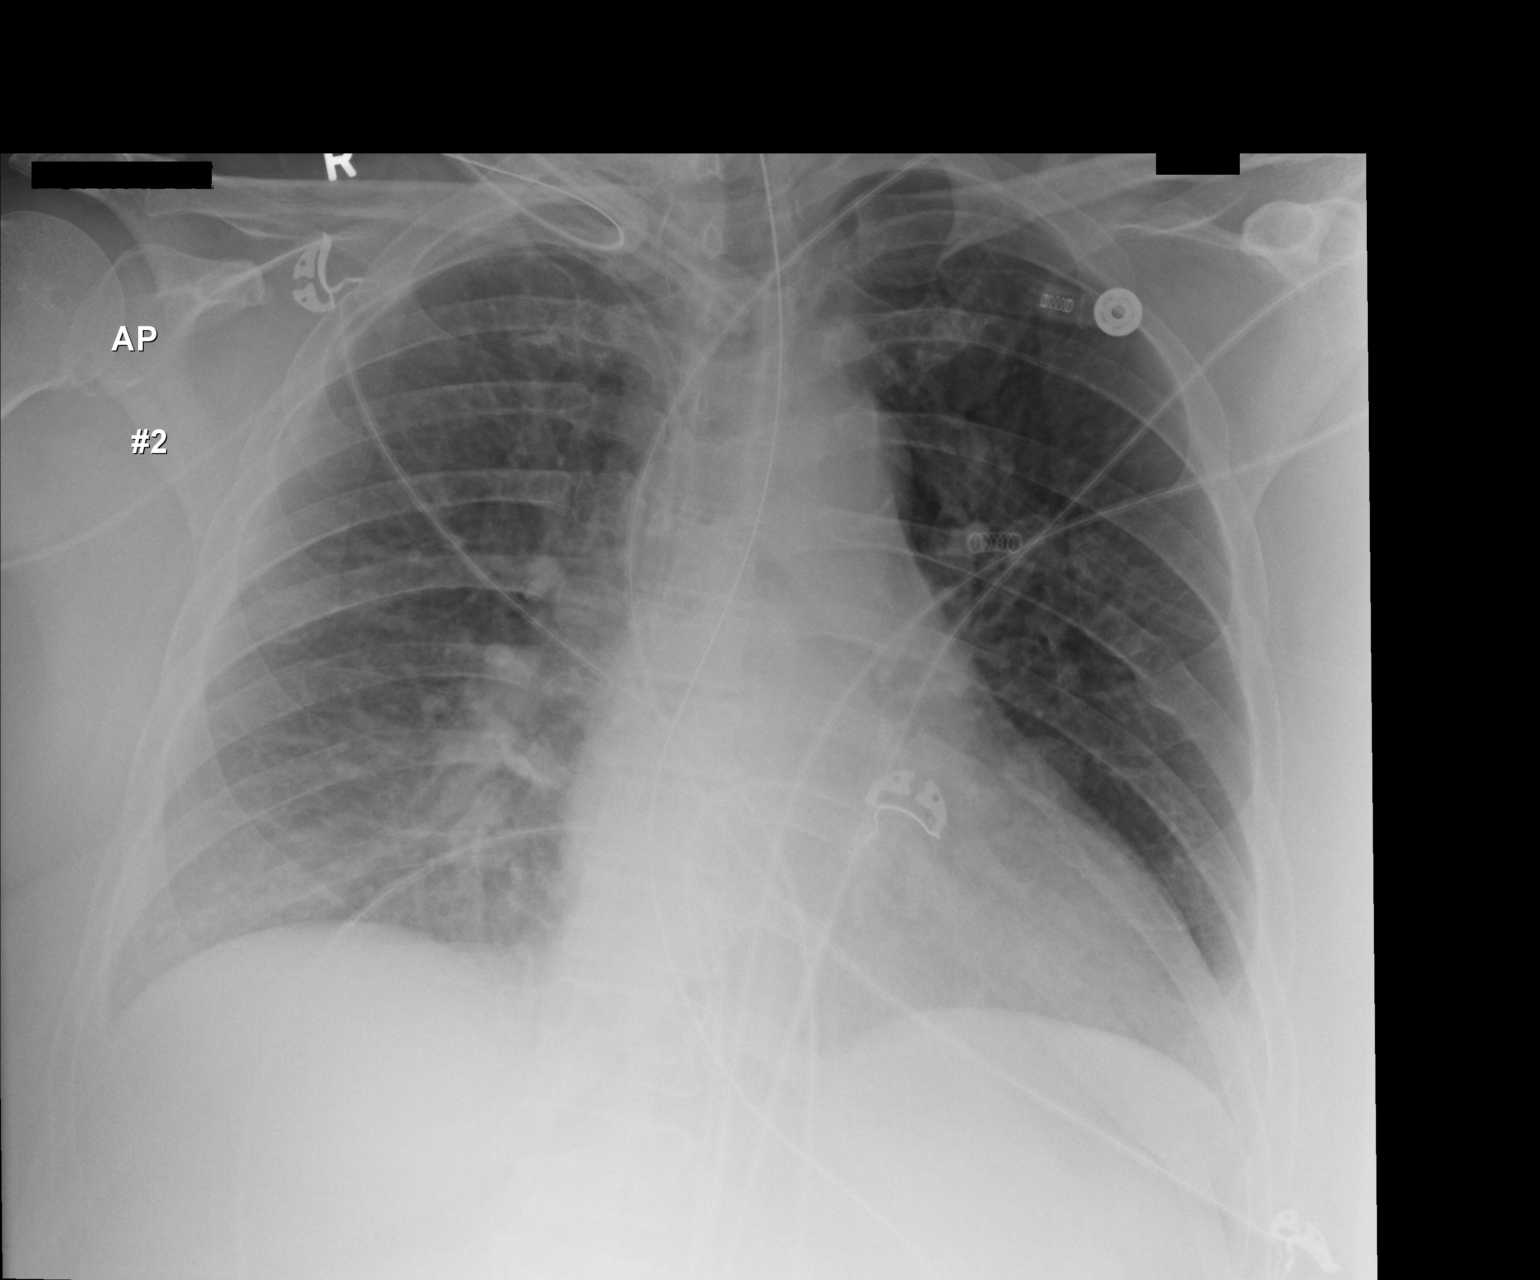

[2 of 2 positions shown; findings below may reference images not displayed]

FINDINGS: Cardiac shadow is stable. An endotracheal tube is noted at least 10
cm above the carina. A nasogastric catheter is seen in the stomach.
A right-sided PICC line is noted in the distal superior vena cava.
No focal infiltrate or sizable effusion is seen. Previously seen
right upper lobe infiltrate has resolved. No bony abnormality is
noted. Old left rib fractures are seen.
IMPRESSION: Endotracheal to several cm above the carina as described. This could
be advanced of 4-5 cm.

Continued clearing of right upper lobe infiltrate.

## 2016-11-18 IMAGING — RF DG SWALLOWING FUNCTION - NRPT [facility]
1 series · 18 of 24 positions shown · non-contrast
Comparison: none

[Series 1: run · 40 acquisitions, 18 frames shown]
[im 1/40]
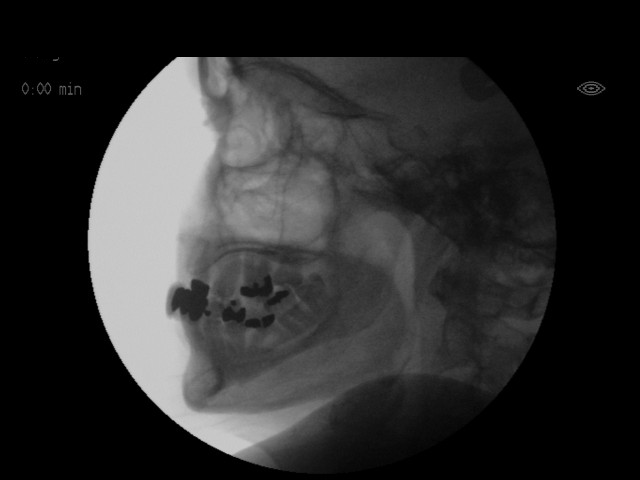
[im 4/40]
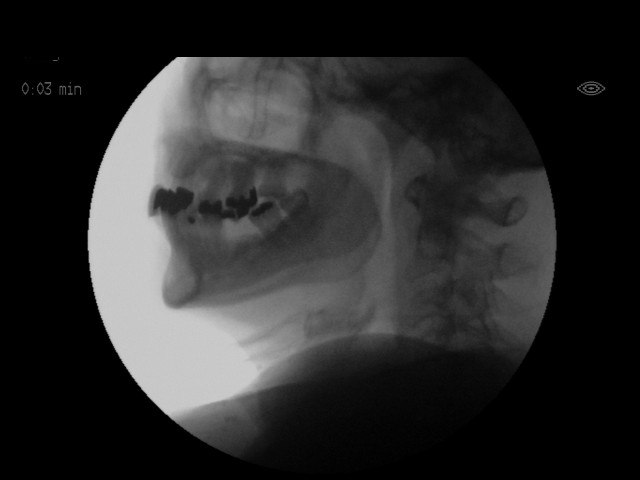
[im 6/40]
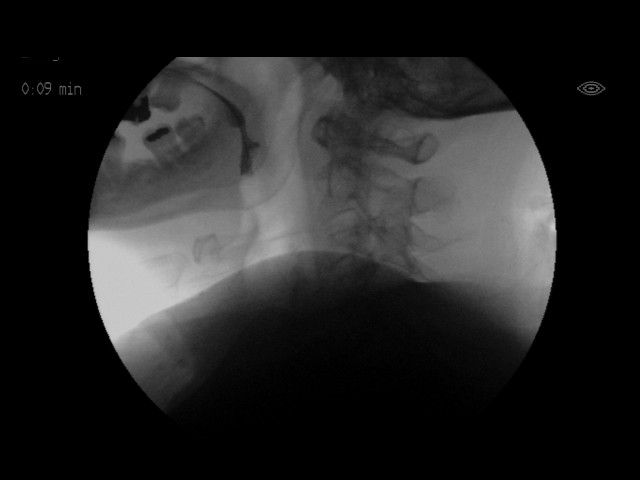
[im 7/40]
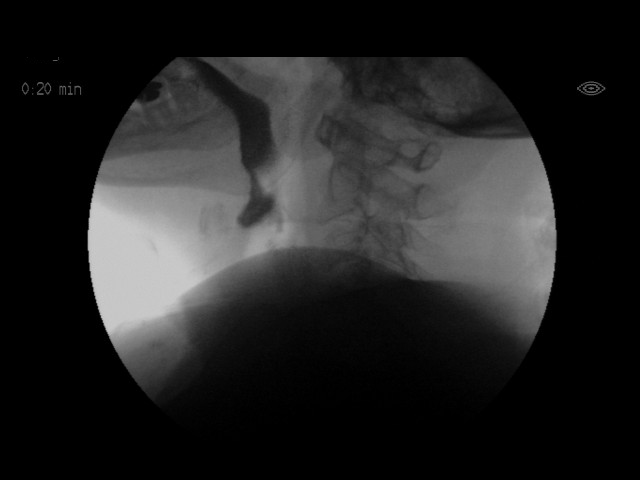
[im 11/40]
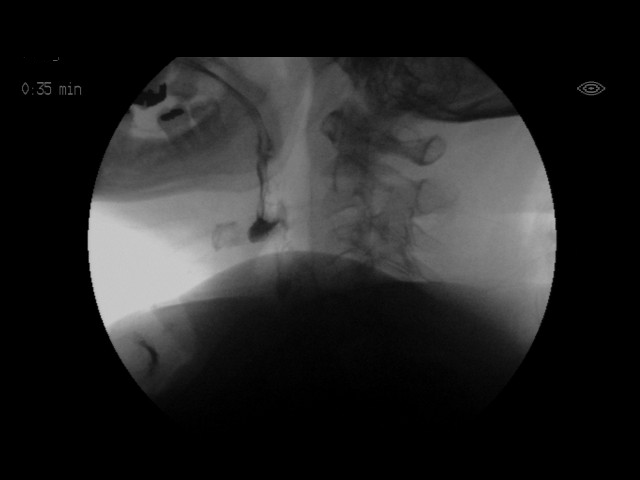
[im 12/40]
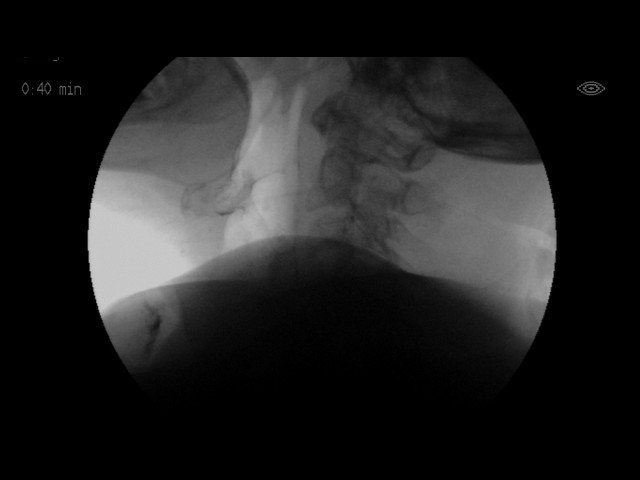
[im 14/40]
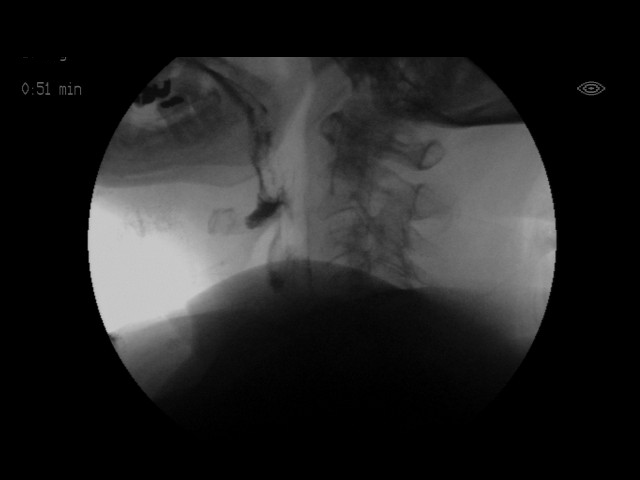
[im 17/40]
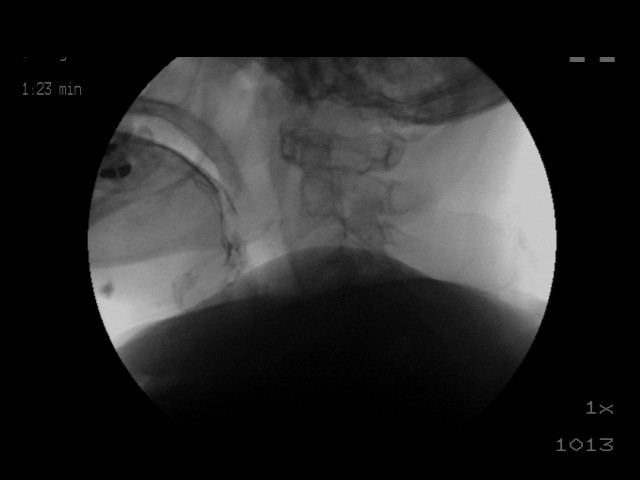
[im 19/40]
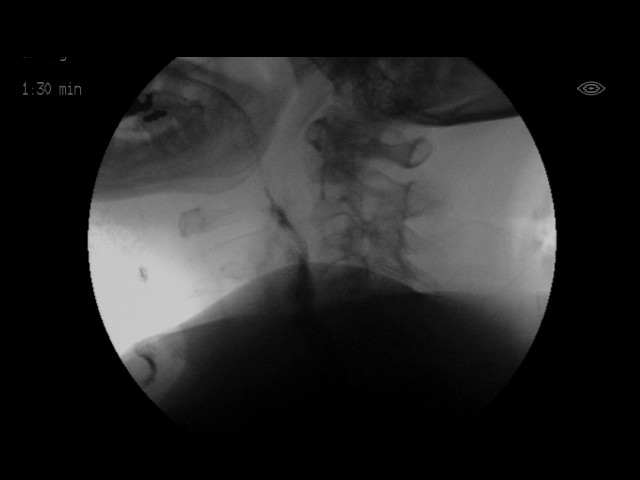
[im 21/40]
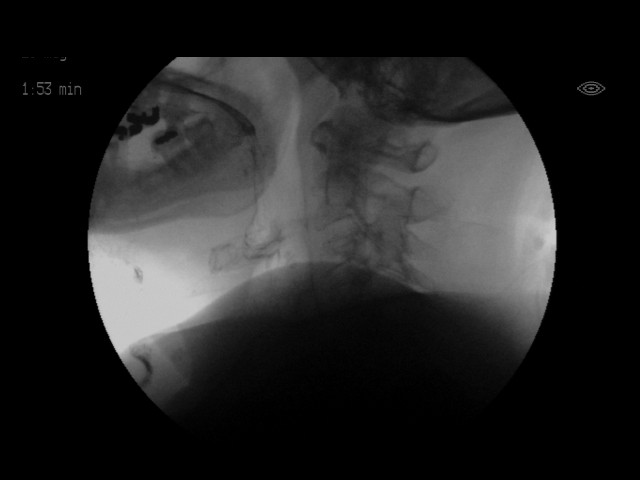
[im 24/40]
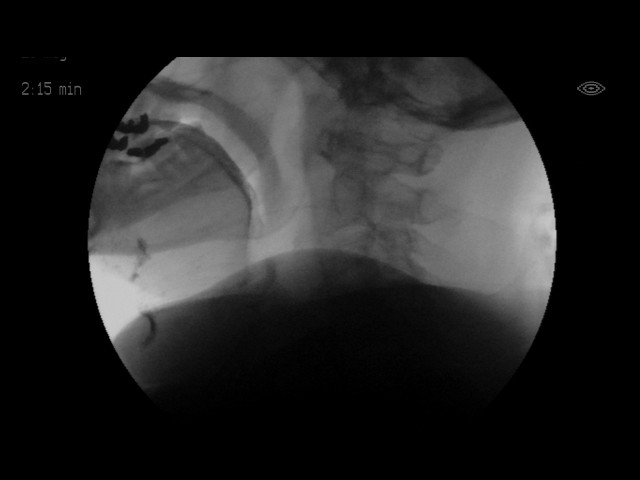
[im 26/40]
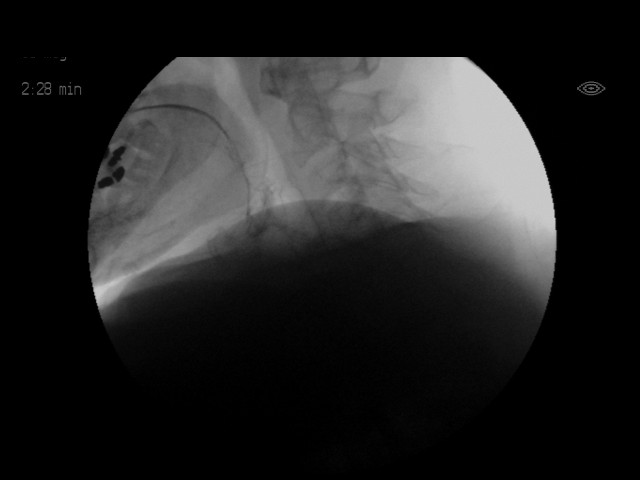
[im 28/40]
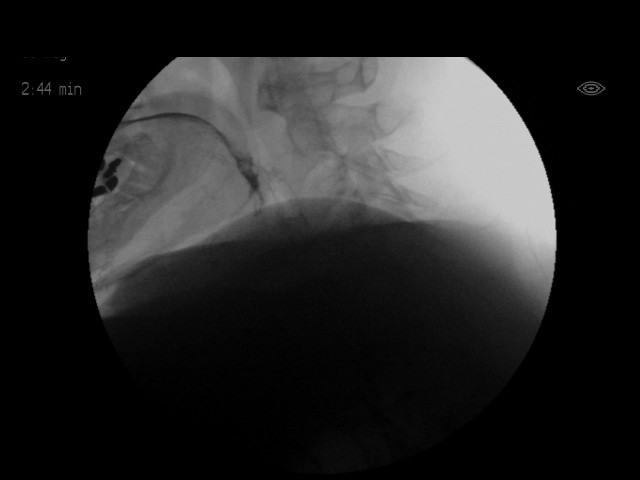
[im 31/40]
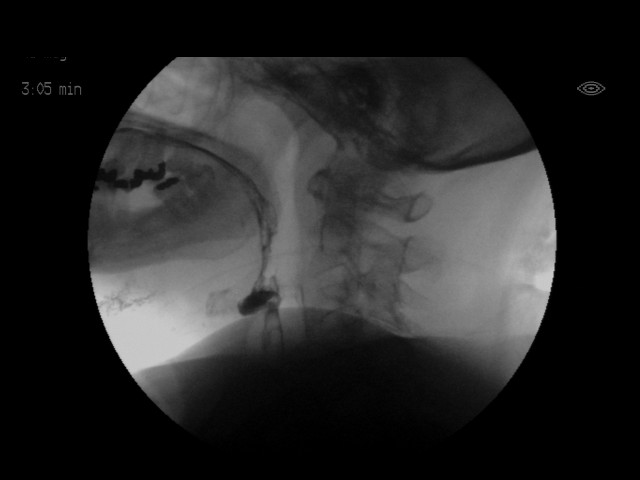
[im 33/40]
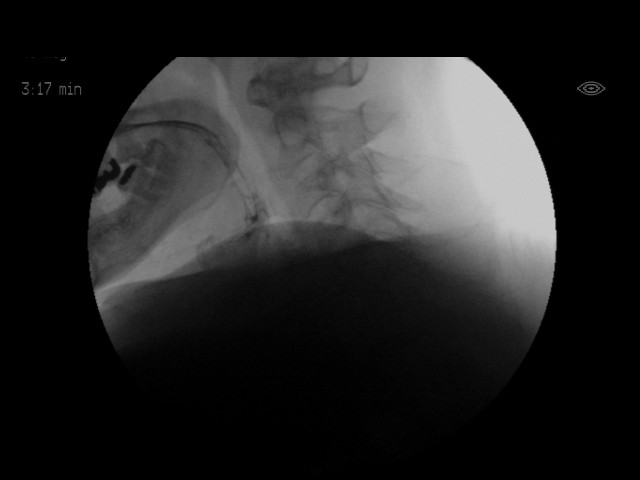
[im 34/40]
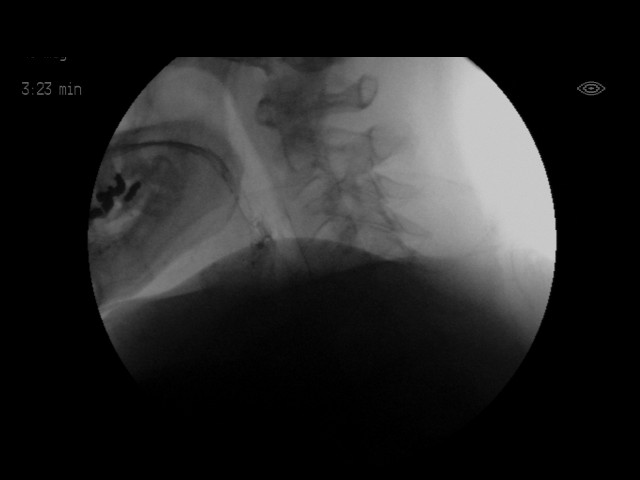
[im 38/40]
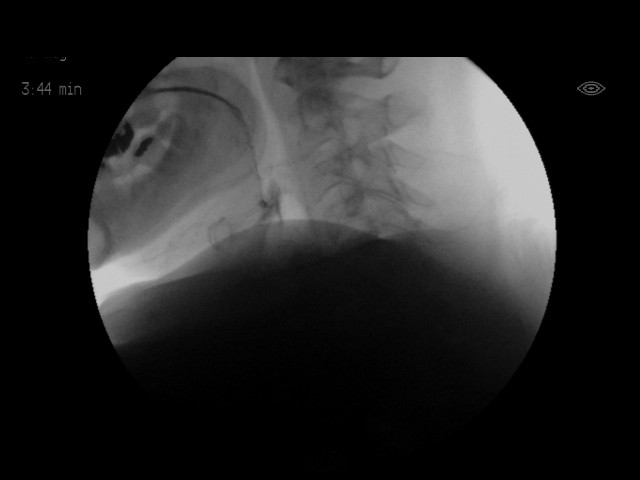
[im 40/40]
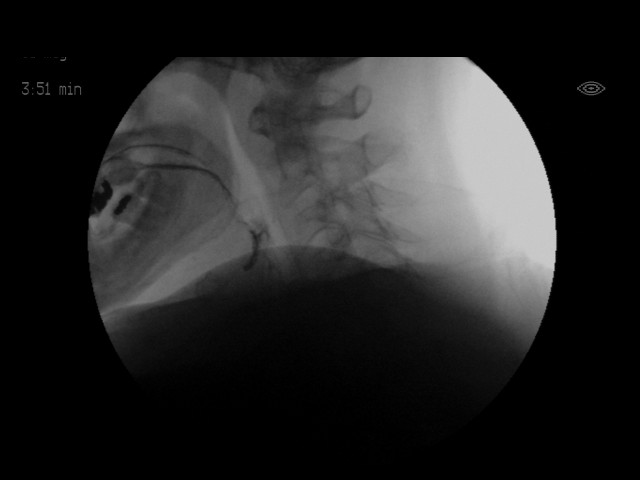

[18 of 24 positions shown; findings below may reference images not displayed]

FLUOROSCOPY FOR SWALLOWING FUNCTION STUDY:
Fluoroscopy was provided for swallowing function study, which was administered by a speech pathologist.  Final results and recommendations from this study are contained within the speech pathology report.

## 2016-12-11 IMAGING — CR DG CHEST 2V
2 series · 2 of 2 positions shown · non-contrast
Comparison: 08/06/2015

CLINICAL DATA: Left-sided weakness

EXAM:
CHEST  2 VIEW

[chest lat]
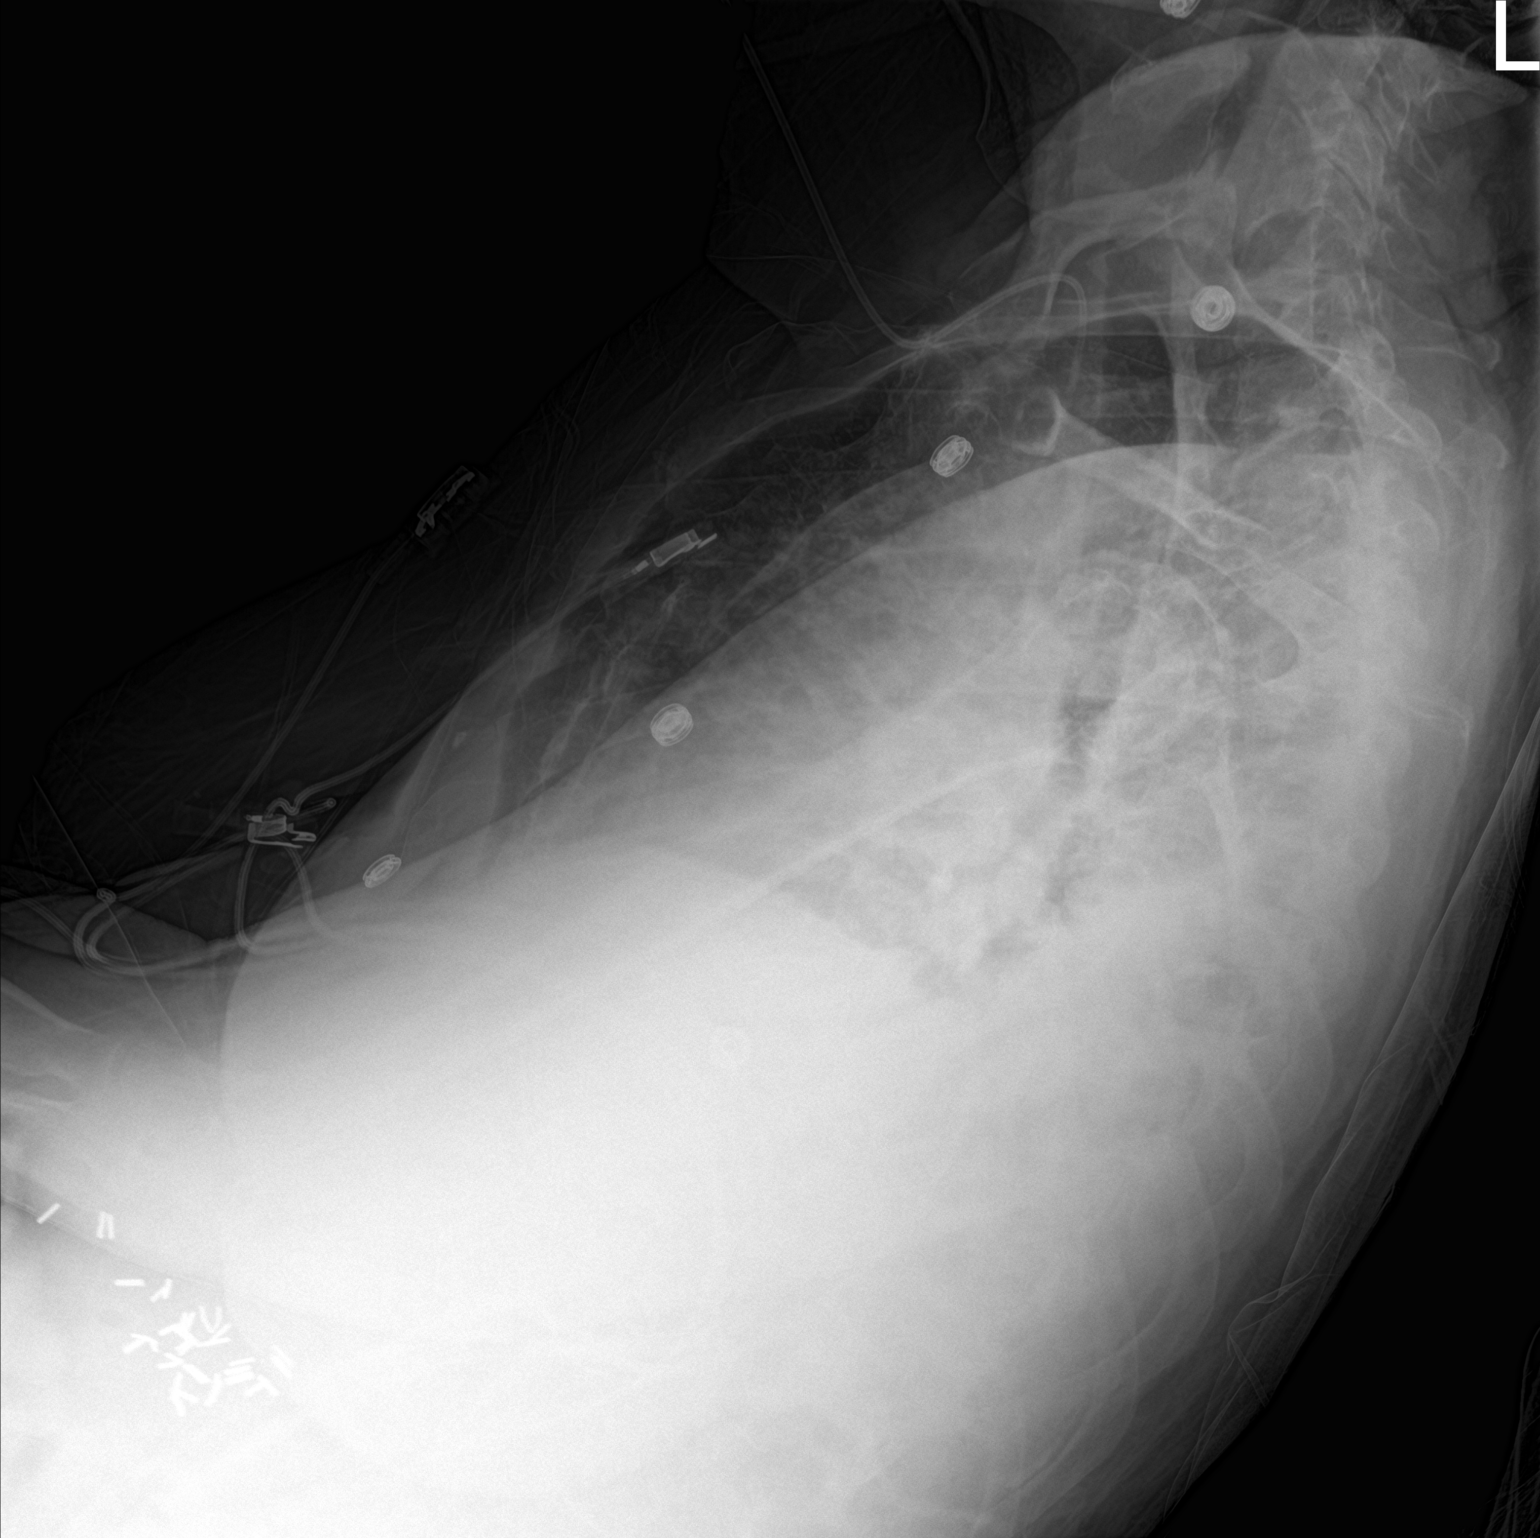

[chest ap]
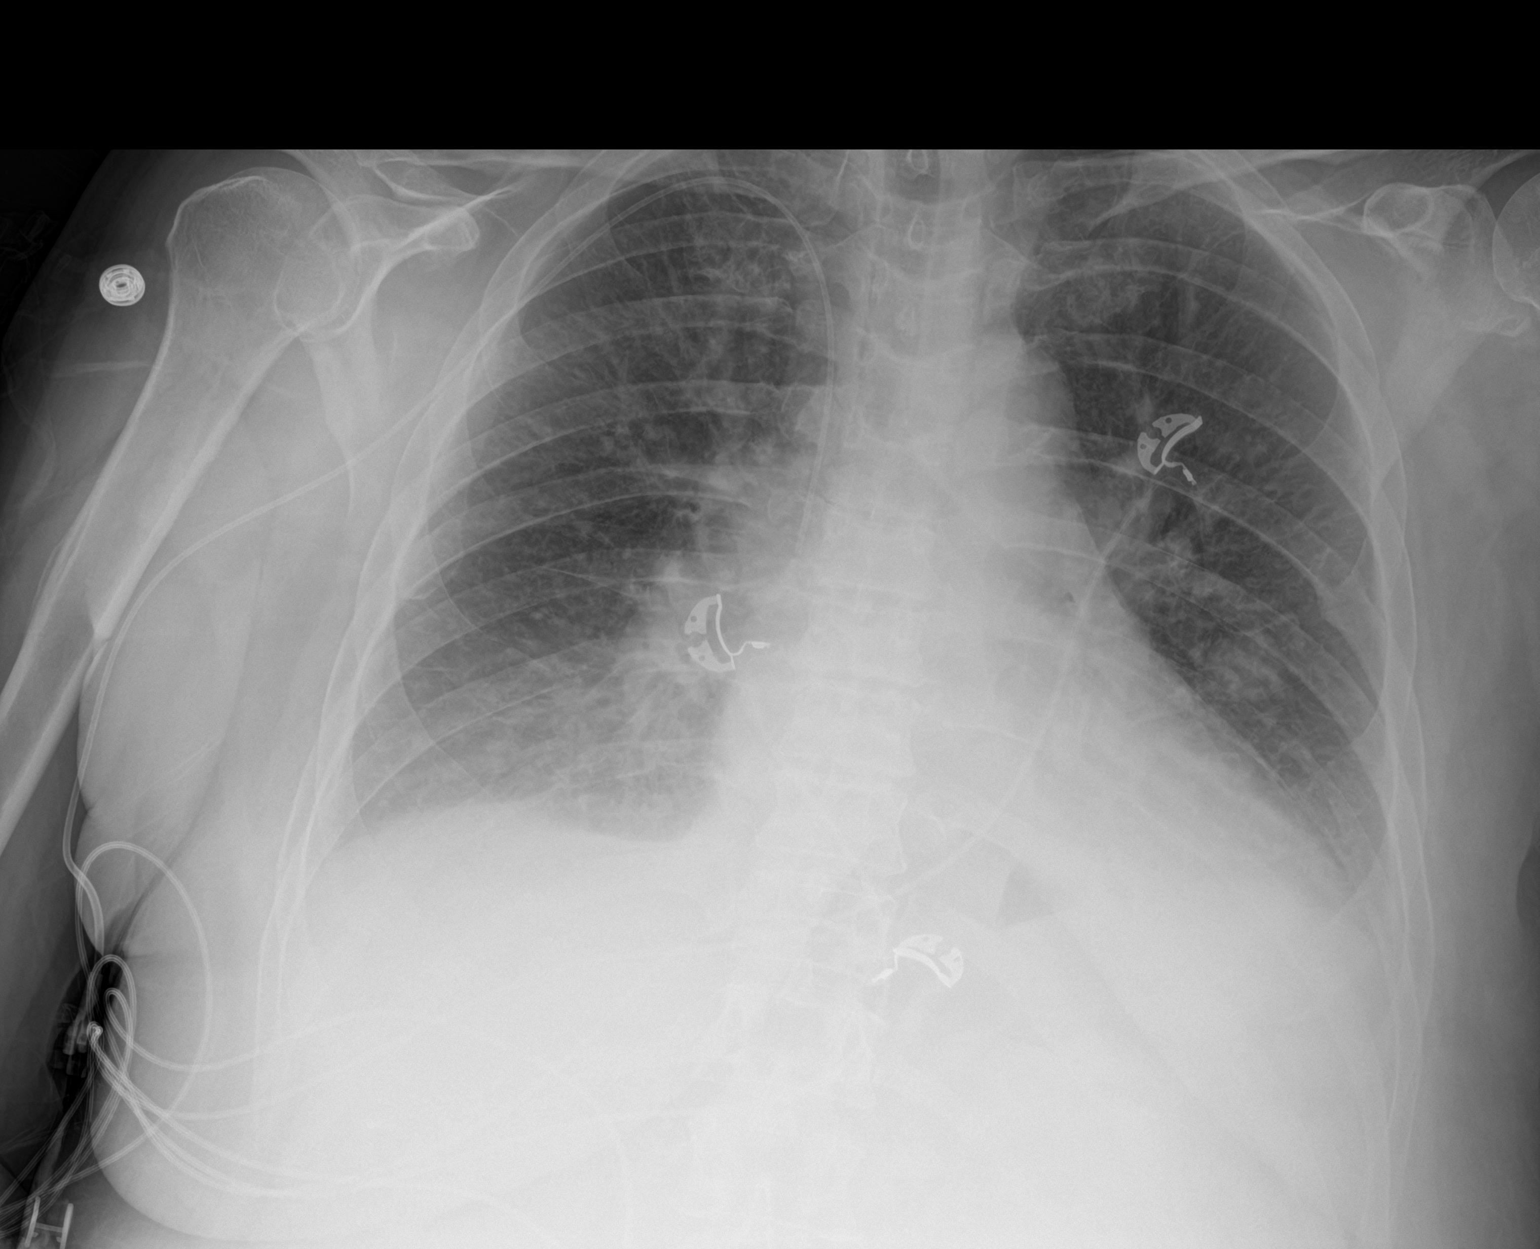

[2 of 2 positions shown; findings below may reference images not displayed]

FINDINGS: Cardiac shadow remains enlarged. A new right-sided PICC line is
noted at the cavoatrial junction. Small bilateral pleural effusions
are again identified. Mild vascular congestion is seen. Some lower
lobe densities are seen likely related atelectasis/infiltrate.
IMPRESSION: Slight increase in right-sided pleural effusion. The remainder the
exam is stable with the exception of interval placement of the PICC
line as described.
